# Patient Record
Sex: Male | Born: 1945
Health system: Southern US, Community
[De-identification: ages and names within clinical notes are randomized; demographics above are authoritative.]

## PROBLEM LIST (undated history)

## (undated) DIAGNOSIS — I2510 Atherosclerotic heart disease of native coronary artery without angina pectoris: Secondary | ICD-10-CM

## (undated) DIAGNOSIS — I480 Paroxysmal atrial fibrillation: Secondary | ICD-10-CM

## (undated) DIAGNOSIS — T8859XA Other complications of anesthesia, initial encounter: Secondary | ICD-10-CM

## (undated) DIAGNOSIS — E7800 Pure hypercholesterolemia, unspecified: Secondary | ICD-10-CM

## (undated) DIAGNOSIS — R079 Chest pain, unspecified: Secondary | ICD-10-CM

## (undated) DIAGNOSIS — R918 Other nonspecific abnormal finding of lung field: Secondary | ICD-10-CM

## (undated) DIAGNOSIS — G4733 Obstructive sleep apnea (adult) (pediatric): Secondary | ICD-10-CM

## (undated) DIAGNOSIS — I10 Essential (primary) hypertension: Secondary | ICD-10-CM

## (undated) DIAGNOSIS — T4145XA Adverse effect of unspecified anesthetic, initial encounter: Secondary | ICD-10-CM

## (undated) DIAGNOSIS — M1990 Unspecified osteoarthritis, unspecified site: Secondary | ICD-10-CM

## (undated) DIAGNOSIS — Z973 Presence of spectacles and contact lenses: Secondary | ICD-10-CM

## (undated) DIAGNOSIS — J40 Bronchitis, not specified as acute or chronic: Secondary | ICD-10-CM

## (undated) DIAGNOSIS — I4819 Other persistent atrial fibrillation: Secondary | ICD-10-CM

## (undated) DIAGNOSIS — G4730 Sleep apnea, unspecified: Secondary | ICD-10-CM

## (undated) HISTORY — DX: Other persistent atrial fibrillation: I48.19

## (undated) HISTORY — DX: Paroxysmal atrial fibrillation: I48.0

## (undated) HISTORY — DX: Obstructive sleep apnea (adult) (pediatric): G47.33

## (undated) HISTORY — DX: Atherosclerotic heart disease of native coronary artery without angina pectoris: I25.10

## (undated) HISTORY — PX: COLONOSCOPY: SHX174

## (undated) SURGERY — Surgical Case
Anesthesia: *Unknown

---
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
## 1998-04-28 HISTORY — PX: KNEE ARTHROSCOPY: SHX127

## 2000-11-30 ENCOUNTER — Ambulatory Visit (HOSPITAL_COMMUNITY): Admission: RE | Admit: 2000-11-30 | Discharge: 2000-11-30 | Payer: Self-pay | Admitting: Gastroenterology

## 2002-04-15 ENCOUNTER — Encounter: Admission: RE | Admit: 2002-04-15 | Discharge: 2002-04-15 | Payer: Self-pay | Admitting: Sports Medicine

## 2002-11-11 ENCOUNTER — Encounter: Admission: RE | Admit: 2002-11-11 | Discharge: 2002-11-11 | Payer: Self-pay | Admitting: Internal Medicine

## 2002-11-11 ENCOUNTER — Encounter: Payer: Self-pay | Admitting: Internal Medicine

## 2002-11-15 ENCOUNTER — Ambulatory Visit (HOSPITAL_BASED_OUTPATIENT_CLINIC_OR_DEPARTMENT_OTHER): Admission: RE | Admit: 2002-11-15 | Discharge: 2002-11-15 | Payer: Self-pay | Admitting: Internal Medicine

## 2002-12-26 ENCOUNTER — Ambulatory Visit (HOSPITAL_COMMUNITY): Admission: RE | Admit: 2002-12-26 | Discharge: 2002-12-26 | Payer: Self-pay | Admitting: Internal Medicine

## 2003-01-01 ENCOUNTER — Ambulatory Visit (HOSPITAL_BASED_OUTPATIENT_CLINIC_OR_DEPARTMENT_OTHER): Admission: RE | Admit: 2003-01-01 | Discharge: 2003-01-01 | Payer: Self-pay | Admitting: Internal Medicine

## 2011-04-10 ENCOUNTER — Ambulatory Visit (INDEPENDENT_AMBULATORY_CARE_PROVIDER_SITE_OTHER): Payer: MEDICARE

## 2011-04-10 DIAGNOSIS — R05 Cough: Secondary | ICD-10-CM

## 2011-04-10 DIAGNOSIS — R059 Cough, unspecified: Secondary | ICD-10-CM

## 2011-04-10 DIAGNOSIS — J209 Acute bronchitis, unspecified: Secondary | ICD-10-CM

## 2012-06-17 ENCOUNTER — Other Ambulatory Visit: Payer: Self-pay | Admitting: Orthopedic Surgery

## 2012-06-17 DIAGNOSIS — M25511 Pain in right shoulder: Secondary | ICD-10-CM

## 2012-06-22 ENCOUNTER — Ambulatory Visit
Admission: RE | Admit: 2012-06-22 | Discharge: 2012-06-22 | Disposition: A | Discharge status: Home / Self Care | Payer: Self-pay | Source: Ambulatory Visit | Attending: Orthopedic Surgery | Admitting: Orthopedic Surgery

## 2012-06-22 DIAGNOSIS — M25511 Pain in right shoulder: Secondary | ICD-10-CM

## 2012-07-09 ENCOUNTER — Encounter (HOSPITAL_BASED_OUTPATIENT_CLINIC_OR_DEPARTMENT_OTHER): Payer: Self-pay | Admitting: *Deleted

## 2012-07-13 ENCOUNTER — Encounter (HOSPITAL_BASED_OUTPATIENT_CLINIC_OR_DEPARTMENT_OTHER): Payer: Self-pay | Admitting: *Deleted

## 2012-07-13 NOTE — Progress Notes (Signed)
Was in [place]-just got back last pm-to see dr Earl Gala today-they will do EKG and Bmet and fax here Pt to bring all meds, cpap amd overnight bag just in case he has to stay

## 2012-07-14 NOTE — H&P (Signed)
[address]   [address] [place], [place] [address] [phone] A Division of [facility]  [name], M.D.   Ata A. Thurston Hole, M.D.   Burnell Blanks, M.D.   Eulas Post, M.D.   Lunette Stands, M.D Buford Dresser, M.D.  Charlsie Quest, M.D.   Estell Harpin, M.D.   Melina Fiddler, M.D. Genene Churn. Barry Dienes, PA-C            Kirstin A. Shepperson, PA-C Josh Merlin, PA-C [place], [place]   RE: Tyrel, Lex                                [id number]      DOB: [birth year] PROGRESS NOTE: 06-29-12 Ronald Hull is a 67 year-old male, new patient to the office.  Referred by Dr. Cindee Salt.  Regular physician Dr. Theressa Millard.  Traumatic injury falling on a sidewalk.  He has had off and on shoulder symptoms since.  Aggravated when he was trying to do some shoveling of snow more recently.  History of what sounds like a rupture of his long head biceps tendon retracted down his arm.  Got black and blue.  Unfortunately this did not resolve all of his subacromial symptoms.  Worked up with an MRI scan that has been completed.  I have reviewed the report and the scan itself.  Large full thickness retracted tear supraspinatus tendon with some tendinopathy of the subscapularis and infraspinatus.  Complete tearing long head biceps, but a retained stump of the tendon in the joint.  Despite the size of the tear and degree of retraction at 2.6 centimeters there is not a lot of demonstrable atrophy.  Degenerative changes AC greater than glenohumeral joint and they don't look too extreme in the glenohumeral joint.  Vast majority of his symptoms related to his cuff rather than arthritis.  He is 26 -years-old and very active.  He comes in to discuss definitive treatment. Entire history, workup and treatment to date reviewed.      EXAMINATION: General exam is outlined and included in the chart.  Specifically, I can still get his right shoulder through full motion.  There is  obvious cuff weakness.  Positive impingement.  Positive palms down abduction.  His biceps long head is out, retracted down, but this is not too extreme.  No apprehension or instability.  AC soreness is present.  Good function and strength opposite left rotator cuff.    X-RAYS: I did get some baseline three view x-rays showing a Type II-III acromion.  Degenerative changes AC joint.  Reasonable subacromial space.  Glenohumeral joint doesn't look bad.    DISPOSITION:  Getting to be longstanding retracted rotator cuff tear, as well as long head biceps tendon tear.  Getting worse rather than better.  We discussed definitive treatment.  One of my big worries is whether or not this is going to be able to be repaired, but I think he is still in that category.  Discussed exam under anesthesia, arthroscopy, decompression, debridement of biceps and rotator cuff repair.  Procedure, risks, benefits and complications reviewed in detail.  Paperwork complete.  All questions answered.  More than 45 minutes spent reviewing all of his previous notes, treatment and the vast majority of this in counseling where to go at this point in time with him.  He understands and agrees.    [name], M.D.  Electronically verified by [name], M.D. DFM:jjh Cc: Dr. Cindee Salt, fax: [phone] Cc: Dr. Theressa Millard, fax: [phone] D 06-30-12 T 07-01-12

## 2012-07-15 ENCOUNTER — Encounter (HOSPITAL_BASED_OUTPATIENT_CLINIC_OR_DEPARTMENT_OTHER)
Admission: RE | Disposition: A | Discharge status: Home / Self Care | Payer: Self-pay | Source: Ambulatory Visit | Attending: Orthopedic Surgery

## 2012-07-15 ENCOUNTER — Encounter (HOSPITAL_BASED_OUTPATIENT_CLINIC_OR_DEPARTMENT_OTHER): Payer: Self-pay | Admitting: Anesthesiology

## 2012-07-15 ENCOUNTER — Encounter (HOSPITAL_BASED_OUTPATIENT_CLINIC_OR_DEPARTMENT_OTHER): Payer: Self-pay | Admitting: *Deleted

## 2012-07-15 ENCOUNTER — Ambulatory Visit (HOSPITAL_BASED_OUTPATIENT_CLINIC_OR_DEPARTMENT_OTHER)
Admission: RE | Admit: 2012-07-15 | Discharge: 2012-07-15 | Disposition: A | Discharge status: Home / Self Care | Payer: Medicare Other | Source: Ambulatory Visit | Attending: Orthopedic Surgery | Admitting: Orthopedic Surgery

## 2012-07-15 ENCOUNTER — Ambulatory Visit (HOSPITAL_BASED_OUTPATIENT_CLINIC_OR_DEPARTMENT_OTHER): Payer: Medicare Other | Admitting: Anesthesiology

## 2012-07-15 DIAGNOSIS — M19019 Primary osteoarthritis, unspecified shoulder: Secondary | ICD-10-CM | POA: Insufficient documentation

## 2012-07-15 DIAGNOSIS — G4730 Sleep apnea, unspecified: Secondary | ICD-10-CM | POA: Insufficient documentation

## 2012-07-15 DIAGNOSIS — M719 Bursopathy, unspecified: Secondary | ICD-10-CM | POA: Insufficient documentation

## 2012-07-15 DIAGNOSIS — M75120 Complete rotator cuff tear or rupture of unspecified shoulder, not specified as traumatic: Secondary | ICD-10-CM | POA: Insufficient documentation

## 2012-07-15 DIAGNOSIS — M129 Arthropathy, unspecified: Secondary | ICD-10-CM | POA: Insufficient documentation

## 2012-07-15 DIAGNOSIS — M67919 Unspecified disorder of synovium and tendon, unspecified shoulder: Secondary | ICD-10-CM | POA: Insufficient documentation

## 2012-07-15 DIAGNOSIS — Z6841 Body Mass Index (BMI) 40.0 and over, adult: Secondary | ICD-10-CM | POA: Insufficient documentation

## 2012-07-15 DIAGNOSIS — M25819 Other specified joint disorders, unspecified shoulder: Secondary | ICD-10-CM | POA: Insufficient documentation

## 2012-07-15 DIAGNOSIS — Z9889 Other specified postprocedural states: Secondary | ICD-10-CM

## 2012-07-15 DIAGNOSIS — I10 Essential (primary) hypertension: Secondary | ICD-10-CM | POA: Insufficient documentation

## 2012-07-15 DIAGNOSIS — M249 Joint derangement, unspecified: Secondary | ICD-10-CM | POA: Insufficient documentation

## 2012-07-15 HISTORY — DX: Other complications of anesthesia, initial encounter: T88.59XA

## 2012-07-15 HISTORY — DX: Sleep apnea, unspecified: G47.30

## 2012-07-15 HISTORY — DX: Essential (primary) hypertension: I10

## 2012-07-15 HISTORY — DX: Presence of spectacles and contact lenses: Z97.3

## 2012-07-15 HISTORY — DX: Pure hypercholesterolemia, unspecified: E78.00

## 2012-07-15 HISTORY — DX: Adverse effect of unspecified anesthetic, initial encounter: T41.45XA

## 2012-07-15 HISTORY — DX: Unspecified osteoarthritis, unspecified site: M19.90

## 2012-07-15 HISTORY — PX: SHOULDER ARTHROSCOPY WITH ROTATOR CUFF REPAIR AND SUBACROMIAL DECOMPRESSION: SHX5686

## 2012-07-15 LAB — POCT HEMOGLOBIN-HEMACUE: Hemoglobin: 15.4 g/dL (ref 13.0–17.0)

## 2012-07-15 SURGERY — SHOULDER ARTHROSCOPY WITH ROTATOR CUFF REPAIR AND SUBACROMIAL DECOMPRESSION
Anesthesia: Regional | Site: Shoulder | Laterality: Right | Wound class: Clean

## 2012-07-15 MED ORDER — HYDROMORPHONE HCL PF 1 MG/ML IJ SOLN: 0.2500 mg | INTRAMUSCULAR | Status: DC | PRN

## 2012-07-15 MED ORDER — FENTANYL CITRATE 0.05 MG/ML IJ SOLN
INTRAMUSCULAR | Status: DC | PRN
  Administered 2012-07-15: 50 ug via INTRAVENOUS
  Administered 2012-07-15 (×2): 25 ug via INTRAVENOUS

## 2012-07-15 MED ORDER — ONDANSETRON HCL 4 MG/2ML IJ SOLN
INTRAMUSCULAR | Status: DC | PRN
  Administered 2012-07-15: 4 mg via INTRAVENOUS

## 2012-07-15 MED ORDER — OXYCODONE HCL 5 MG PO TABS: 5.0000 mg | ORAL_TABLET | Freq: Once | ORAL | Status: DC | PRN

## 2012-07-15 MED ORDER — OXYCODONE-ACETAMINOPHEN 5-325 MG PO TABS: 1.0000 | ORAL_TABLET | ORAL | Status: DC | PRN

## 2012-07-15 MED ORDER — SODIUM CHLORIDE 0.9 % IR SOLN
Status: DC | PRN
  Administered 2012-07-15: 9000 mL

## 2012-07-15 MED ORDER — CEFAZOLIN SODIUM-DEXTROSE 2-3 GM-% IV SOLR
2.0000 g | INTRAVENOUS | Status: AC
  Administered 2012-07-15: 3 g via INTRAVENOUS

## 2012-07-15 MED ORDER — LACTATED RINGERS IV SOLN
INTRAVENOUS | Status: DC
  Administered 2012-07-15: 07:00:00 via INTRAVENOUS

## 2012-07-15 MED ORDER — LIDOCAINE HCL (CARDIAC) 20 MG/ML IV SOLN
INTRAVENOUS | Status: DC | PRN
  Administered 2012-07-15: 100 mg via INTRAVENOUS

## 2012-07-15 MED ORDER — PROPOFOL 10 MG/ML IV BOLUS
INTRAVENOUS | Status: DC | PRN
  Administered 2012-07-15: 250 mg via INTRAVENOUS

## 2012-07-15 MED ORDER — SUCCINYLCHOLINE CHLORIDE 20 MG/ML IJ SOLN
INTRAMUSCULAR | Status: DC | PRN
  Administered 2012-07-15: 100 mg via INTRAVENOUS

## 2012-07-15 MED ORDER — FENTANYL CITRATE 0.05 MG/ML IJ SOLN
50.0000 ug | INTRAMUSCULAR | Status: DC | PRN
  Administered 2012-07-15: 100 ug via INTRAVENOUS

## 2012-07-15 MED ORDER — DEXAMETHASONE SODIUM PHOSPHATE 4 MG/ML IJ SOLN
INTRAMUSCULAR | Status: DC | PRN
  Administered 2012-07-15: 10 mg via INTRAVENOUS

## 2012-07-15 MED ORDER — MIDAZOLAM HCL 2 MG/2ML IJ SOLN
1.0000 mg | INTRAMUSCULAR | Status: DC | PRN
  Administered 2012-07-15: 2 mg via INTRAVENOUS

## 2012-07-15 MED ORDER — BUPIVACAINE-EPINEPHRINE PF 0.5-1:200000 % IJ SOLN
INTRAMUSCULAR | Status: DC | PRN
  Administered 2012-07-15: 30 mL

## 2012-07-15 MED ORDER — OXYCODONE HCL 5 MG/5ML PO SOLN: 5.0000 mg | Freq: Once | ORAL | Status: DC | PRN

## 2012-07-15 MED ORDER — ONDANSETRON HCL 4 MG/2ML IJ SOLN: 4.0000 mg | Freq: Four times a day (QID) | INTRAMUSCULAR | Status: DC | PRN

## 2012-07-15 SURGICAL SUPPLY — 67 items
ANCHOR PEEK SWIVEL LOCK 5.5 (Anchor) ×4 IMPLANT
BENZOIN TINCTURE PRP APPL 2/3 (GAUZE/BANDAGES/DRESSINGS) IMPLANT
BLADE CUTTER GATOR 3.5 (BLADE) ×2 IMPLANT
BLADE CUTTER MENIS 5.5 (BLADE) IMPLANT
BLADE GREAT WHITE 4.2 (BLADE) ×2 IMPLANT
BLADE SURG 15 STRL LF DISP TIS (BLADE) IMPLANT
BLADE SURG 15 STRL SS (BLADE)
BUR OVAL 6.0 (BURR) ×2 IMPLANT
CANISTER OMNI JUG 16 LITER (MISCELLANEOUS) ×2 IMPLANT
CANISTER SUCTION 2500CC (MISCELLANEOUS) IMPLANT
CANNULA DRY DOC 8X75 (CANNULA) ×2 IMPLANT
CANNULA TWIST IN 8.25X7CM (CANNULA) IMPLANT
CLOTH BEACON ORANGE TIMEOUT ST (SAFETY) ×2 IMPLANT
DECANTER SPIKE VIAL GLASS SM (MISCELLANEOUS) IMPLANT
DRAPE OEC MINIVIEW 54X84 (DRAPES) IMPLANT
DRAPE STERI 35X30 U-POUCH (DRAPES) ×2 IMPLANT
DRAPE U-SHAPE 47X51 STRL (DRAPES) ×2 IMPLANT
DRAPE U-SHAPE 76X120 STRL (DRAPES) ×4 IMPLANT
DRSG PAD ABDOMINAL 8X10 ST (GAUZE/BANDAGES/DRESSINGS) ×2 IMPLANT
DURAPREP 26ML APPLICATOR (WOUND CARE) ×2 IMPLANT
ELECT MENISCUS 165MM 90D (ELECTRODE) ×2 IMPLANT
ELECT NEEDLE TIP 2.8 STRL (NEEDLE) IMPLANT
ELECT REM PT RETURN 9FT ADLT (ELECTROSURGICAL) ×2
ELECTRODE REM PT RTRN 9FT ADLT (ELECTROSURGICAL) ×1 IMPLANT
GAUZE XEROFORM 1X8 LF (GAUZE/BANDAGES/DRESSINGS) ×2 IMPLANT
GLOVE BIOGEL PI IND STRL 8 (GLOVE) ×1 IMPLANT
GLOVE BIOGEL PI INDICATOR 8 (GLOVE) ×1
GLOVE ORTHO TXT STRL SZ7.5 (GLOVE) ×4 IMPLANT
GOWN PREVENTION PLUS XLARGE (GOWN DISPOSABLE) ×4 IMPLANT
GOWN STRL REIN 2XL XLG LVL4 (GOWN DISPOSABLE) ×2 IMPLANT
IV NS IRRIG 3000ML ARTHROMATIC (IV SOLUTION) ×8 IMPLANT
NDL SUT 6 .5 CRC .975X.05 MAYO (NEEDLE) IMPLANT
NEEDLE MAYO TAPER (NEEDLE)
NEEDLE SCORPION MULTI FIRE (NEEDLE) ×2 IMPLANT
NS IRRIG 1000ML POUR BTL (IV SOLUTION) IMPLANT
PACK ARTHROSCOPY DSU (CUSTOM PROCEDURE TRAY) ×2 IMPLANT
PACK BASIN DAY SURGERY FS (CUSTOM PROCEDURE TRAY) ×2 IMPLANT
PASSER SUT SWANSON 36MM LOOP (INSTRUMENTS) IMPLANT
PENCIL BUTTON HOLSTER BLD 10FT (ELECTRODE) ×2 IMPLANT
SET ARTHROSCOPY TUBING (MISCELLANEOUS) ×1
SET ARTHROSCOPY TUBING LN (MISCELLANEOUS) ×1 IMPLANT
SLEEVE SCD COMPRESS KNEE MED (MISCELLANEOUS) ×2 IMPLANT
SLING ARM FOAM STRAP LRG (SOFTGOODS) IMPLANT
SLING ARM FOAM STRAP MED (SOFTGOODS) IMPLANT
SLING ARM FOAM STRAP XLG (SOFTGOODS) IMPLANT
SLING ARM IMMOBILIZER LRG (SOFTGOODS) IMPLANT
SLING ARM IMMOBILIZER MED (SOFTGOODS) IMPLANT
SPONGE GAUZE 4X4 12PLY (GAUZE/BANDAGES/DRESSINGS) ×4 IMPLANT
SPONGE LAP 4X18 X RAY DECT (DISPOSABLE) IMPLANT
STRIP CLOSURE SKIN 1/2X4 (GAUZE/BANDAGES/DRESSINGS) IMPLANT
SUCTION FRAZIER TIP 10 FR DISP (SUCTIONS) IMPLANT
SUT ETHIBOND 2 OS 4 DA (SUTURE) IMPLANT
SUT ETHILON 2 0 FS 18 (SUTURE) IMPLANT
SUT ETHILON 3 0 PS 1 (SUTURE) ×2 IMPLANT
SUT FIBERWIRE #2 38 T-5 BLUE (SUTURE)
SUT RETRIEVER MED (INSTRUMENTS) IMPLANT
SUT TIGER TAPE 7 IN WHITE (SUTURE) ×2 IMPLANT
SUT VIC AB 0 CT1 27 (SUTURE)
SUT VIC AB 0 CT1 27XBRD ANBCTR (SUTURE) IMPLANT
SUT VIC AB 2-0 SH 27 (SUTURE)
SUT VIC AB 2-0 SH 27XBRD (SUTURE) IMPLANT
SUT VIC AB 3-0 FS2 27 (SUTURE) IMPLANT
SUTURE FIBERWR #2 38 T-5 BLUE (SUTURE) IMPLANT
TAPE FIBER 2MM 7IN #2 BLUE (SUTURE) ×2 IMPLANT
TOWEL OR 17X24 6PK STRL BLUE (TOWEL DISPOSABLE) ×2 IMPLANT
WATER STERILE IRR 1000ML POUR (IV SOLUTION) ×2 IMPLANT
YANKAUER SUCT BULB TIP NO VENT (SUCTIONS) IMPLANT

## 2012-07-15 NOTE — Interval H&P Note (Signed)
History and Physical Interval Note:  07/15/2012 7:28 AM  Ronald Hull  has presented today for surgery, with the diagnosis of RIGHT SHOULDER IMPINGEMENT SYNDROME, DEGENERATIVE ARTHRITIS-SHOULDER, A/C JOINT, COMPLETE RUPTURE OF ROTATOR CUFF, 727.61  The various methods of treatment have been discussed with the patient and family. After consideration of risks, benefits and other options for treatment, the patient has consented to  Procedure(s) with comments: RIGHT ARTHROSCOPY SHOULDER DECOMPRESSION  SUBACROMIAL PARTIAL ACROMIOPLASTY WITH CORACOACROMIAL RELEASE, ARTHROSCOPY SHOULDER DISTAL CLAVICULECTOMY, ARTHROSCOPY SHOULDER WITH ROTATOR CUFF REPAIR (Right) - RIGHT SHOULDER SCOPE, SAD, DCE, RTC REPAIR ANESTHESIA: GENERAL, PRE/POST OP SCALENE as a surgical intervention .  The patient's history has been reviewed, patient examined, no change in status, stable for surgery.  I have reviewed the patient's chart and labs.  Questions were answered to the patient's satisfaction.     Glynnis Gavel F

## 2012-07-15 NOTE — Transfer of Care (Signed)
Immediate Anesthesia Transfer of Care Note  Patient: Ronald Hull  Procedure(s) Performed: Procedure(s): RIGHT ARTHROSCOPY SHOULDER DECOMPRESSION  SUBACROMIAL PARTIAL ACROMIOPLASTY WITH CORACOACROMIAL RELEASE,  DISTAL CLAVICULECTOMY, resect biceps debride labrium WITH ROTATOR CUFF REPAIR (Right)  Patient Location: PACU  Anesthesia Type:General  Level of Consciousness: awake and alert   Airway & Oxygen Therapy: Patient Spontanous Breathing and Patient connected to face mask oxygen  Post-op Assessment: Report given to PACU RN and Post -op Vital signs reviewed and stable  Post vital signs: Reviewed and stable  Complications: No apparent anesthesia complications

## 2012-07-15 NOTE — Anesthesia Preprocedure Evaluation (Signed)
Anesthesia Evaluation  Patient identified by MRN, date of birth, ID band Patient awake    Reviewed: Allergy & Precautions, H&P , NPO status , Patient's Chart, lab work & pertinent test results  Airway Mallampati: II  Neck ROM: full    Dental   Pulmonary sleep apnea ,          Cardiovascular hypertension,     Neuro/Psych    GI/Hepatic   Endo/Other  Morbid obesity  Renal/GU      Musculoskeletal  (+) Arthritis -,   Abdominal   Peds  Hematology   Anesthesia Other Findings   Reproductive/Obstetrics                           Anesthesia Physical Anesthesia Plan  ASA: II  Anesthesia Plan: General and Regional   Post-op Pain Management: MAC Combined w/ Regional for Post-op pain   Induction: Intravenous  Airway Management Planned: Oral ETT  Additional Equipment:   Intra-op Plan:   Post-operative Plan: Extubation in OR  Informed Consent: I have reviewed the patients History and Physical, chart, labs and discussed the procedure including the risks, benefits and alternatives for the proposed anesthesia with the patient or authorized representative who has indicated his/her understanding and acceptance.     Plan Discussed with: CRNA and Surgeon  Anesthesia Plan Comments:         Anesthesia Quick Evaluation

## 2012-07-15 NOTE — Anesthesia Postprocedure Evaluation (Signed)
Anesthesia Post Note  Patient: Ronald Hull  Procedure(s) Performed: Procedure(s) (LRB): RIGHT ARTHROSCOPY SHOULDER DECOMPRESSION  SUBACROMIAL PARTIAL ACROMIOPLASTY WITH CORACOACROMIAL RELEASE,  DISTAL CLAVICULECTOMY, resect biceps debride labrium WITH ROTATOR CUFF REPAIR (Right)  Anesthesia type: General  Patient location: PACU  Post pain: Pain level controlled and Adequate analgesia  Post assessment: Post-op Vital signs reviewed, Patient's Cardiovascular Status Stable, Respiratory Function Stable, Patent Airway and Pain level controlled  Last Vitals:  Filed Vitals:   07/15/12 0945  BP:   Pulse: 62  Temp:   Resp: 15    Post vital signs: Reviewed and stable  Level of consciousness: awake, alert  and oriented  Complications: No apparent anesthesia complications

## 2012-07-15 NOTE — Brief Op Note (Signed)
07/15/2012  10:21 AM  PATIENT:  Ronald Hull  67 y.o. male  PRE-OPERATIVE DIAGNOSIS:  RIGHT SHOULDER IMPINGEMENT SYNDROME, DEGENERATIVE ARTHRITIS-SHOULDER, A/C JOINT, COMPLETE RUPTURE OF ROTATOR CUFF, 727.61  POST-OPERATIVE DIAGNOSIS:  right shoulder impingement syndrome degenerative arthritis shoulder acromioclavicular joint complete rupture of rotator cuff tear labrum  PROCEDURE:  Procedure(s): RIGHT ARTHROSCOPY SHOULDER DECOMPRESSION  SUBACROMIAL PARTIAL ACROMIOPLASTY WITH CORACOACROMIAL RELEASE,  DISTAL CLAVICULECTOMY, resect biceps debride labrium WITH ROTATOR CUFF REPAIR (Right)  SURGEON:  Surgeon(s) and Role:    * [name], MD - Primary  PHYSICIAN ASSISTANT: Zonia Kief M    ANESTHESIA:   general  EBL:  Total I/O In: 1230 [P.O.:30; I.V.:1200] Out: -    SPECIMEN:  No Specimen  DISPOSITION OF SPECIMEN:  N/A  COUNTS:  YES  TOURNIQUET:  * No tourniquets in log * PATIENT DISPOSITION:  PACU - hemodynamically stable.

## 2012-07-15 NOTE — Anesthesia Procedure Notes (Addendum)
Procedure Name: Intubation Performed by: York Grice Pre-anesthesia Checklist: Patient identified, Timeout performed, Emergency Drugs available, Suction available and Patient being monitored Patient Re-evaluated:Patient Re-evaluated prior to inductionOxygen Delivery Method: Circle system utilized Preoxygenation: Pre-oxygenation with 100% oxygen Intubation Type: IV induction Ventilation: Mask ventilation without difficulty Laryngoscope Size: Miller and 2 Grade View: Grade I Tube size: 8.0 mm Number of attempts: 1 Airway Equipment and Method: Stylet and Video-laryngoscopy Placement Confirmation: breath sounds checked- equal and bilateral and positive ETCO2 Secured at: 23 cm Tube secured with: Tape Dental Injury: Teeth and Oropharynx as per pre-operative assessment    Anesthesia Regional Block:  Interscalene brachial plexus block  Pre-Anesthetic Checklist: ,, timeout performed, Correct Patient, Correct Site, Correct Laterality, Correct Procedure, Correct Position, site marked, Risks and benefits discussed,  Surgical consent,  Pre-op evaluation,  At surgeon's request and post-op pain management  Laterality: Right  Prep: chloraprep       Needles:  Injection technique: Single-shot  Needle Type: Echogenic Stimulator Needle     Needle Length: 5cm 5 cm Needle Gauge: 22 and 22 G    Additional Needles:  Procedures: ultrasound guided (picture in chart) and nerve stimulator Interscalene brachial plexus block  Nerve Stimulator or Paresthesia:  Response: biceps flexion, 0.45 mA,   Additional Responses:   Narrative:  Start time: 07/15/2012 7:04 AM End time: 07/15/2012 7:12 AM Injection made incrementally with aspirations every 5 mL.  Performed by: Personally  Anesthesiologist: Dr Chaney Malling  Additional Notes: Functioning IV was confirmed and monitors were applied.  A 50mm 22ga Arrow echogenic stimulator needle was used. Sterile prep and drape,hand hygiene and sterile gloves  were used.  Negative aspiration and negative test dose prior to incremental administration of local anesthetic. The patient tolerated the procedure well.  Ultrasound guidance: relevent anatomy identified, needle position confirmed, local anesthetic spread visualized around nerve(s), vascular puncture avoided.  Image printed for medical record.   Interscalene brachial plexus block

## 2012-07-15 NOTE — Progress Notes (Signed)
Assisted Dr. Hodierne with right, ultrasound guided, interscalene  block. Side rails up, monitors on throughout procedure. See vital signs in flow sheet. Tolerated Procedure well. 

## 2012-07-16 ENCOUNTER — Encounter (HOSPITAL_BASED_OUTPATIENT_CLINIC_OR_DEPARTMENT_OTHER): Payer: Self-pay | Admitting: Orthopedic Surgery

## 2012-07-19 NOTE — Op Note (Signed)
NAMESHOWN, DISSINGER NO.:  [id number]  MEDICAL RECORD NO.:  [id number]  LOCATION:                                 FACILITY:  PHYSICIAN:  [name], M.D. DATE OF BIRTH:  08-14-1945  DATE OF PROCEDURE:  07/15/2012 DATE OF DISCHARGE:                              OPERATIVE REPORT   PREOPERATIVE DIAGNOSES: 1. Right shoulder acute/subacute rotator cuff tear, entire     supraspinatus as well as portions of infraspinatus and     subscapularis, retracted. 2. Tearing long head biceps tendon, impingement. 3. Distal clavicle degenerative joint disease.  POSTOPERATIVE DIAGNOSES: 1. Right shoulder acute/subacute rotator cuff tear, entire     supraspinatus as well as portions of infraspinatus and     subscapularis, retracted. 2. Tearing long head biceps tendon, impingement. 3. Distal clavicle degenerative joint disease. 4. Large retained stump of biceps tendon. 5. Large retracted cuff tear, but this was reasonable tissue quality,     able to be mobilized and repaired.  Extensive circumferential     tearing of labrum.  No other significant degenerative changes.  PROCEDURES: 1. Right shoulder exam under anesthesia. 2. Arthroscopy. 3. Debridement of retained biceps. 4. Debridement of circumferential labral tears. 5. Debridement of mobilization rotator cuff. 6. Bursectomy. 7. Acromioplasty. 8. Coracoacromial ligament release. 9. Excision of distal clavicle. 10.Arthroscopic-assisted rotator cuff repair, fiber weave suture x2,     swivel lock anchors x2.  SURGEON:  [name], MD  ASSISTANT:  Genene Churn. Barry Dienes, [place], present throughout the entire case and necessary for timely completion of procedure.  ANESTHESIA:  General.  ESTIMATED BLOOD LOSS:  Minimal.  SPECIMENS:  None.  CULTURES:  None.  COMPLICATIONS:  None.  DRESSINGS:  Soft compressive shoulder immobilizer.  PROCEDURE:  The patient was brought to the operating room, placed on  the operating table in supine position.  After adequate anesthesia had been obtained, shoulder examined.  Full motion, stable shoulder.  Placed in a beach-chair position on the shoulder positioner, prepped and draped in usual sterile fashion.  Three portals anterior, posterior, and lateral. Arthroscope introduced.  Shoulder distended and inspected. Circumferential degenerative labral tears debrided.  Minimal degenerative changes in the glenohumeral joint.  Stump of biceps debrided out.  All the radial artery were retracted out of the shoulder. The entire supraspinatus top of the infraspinatus is subscapularis torn. These were retracted good 2-3 cm, but still tissue quality was relatively good.  Interval tearing between the infra and supraspinatus. I was able to mobilize this, assess it, and I felt it could be repaired. Type 3 acromion.  Acromioplasty to a type 1 with shaver and high-speed burr, releasing CA ligament facilitated.  Grade 4 changes distal clavicle.  Periarticular spurs lateral cm of clavicle resected.  With a cannula laterally, the cuff was mobilized, tuberosity roughened.  I placed a horizontal mattress suture in the infraspinatus, brought it out laterally, anchored it down the tuberosity with a swivel lock anchor.  I then used a second fiber weave suture to the baseball stitch between the infraspinatus and supraspinatus, bringing both of these out laterally and closing the interval tear.  This was then anchored down into the second  swivel lock anchor more anteriorly in the tuberosity.  Nice firm watertight closure achieved.  Assessed from above and below. Instruments were fully removed.  Portals were closed with nylon. Sterile compressive dressing applied.  Sling applied.  Anesthesia reversed.  Brought to the recovery room.  Tolerated the surgery well with no complications.     [name], M.D.     DFM/MEDQ  D:  07/15/2012  T:  07/16/2012  Job:  [id number]

## 2012-07-27 ENCOUNTER — Ambulatory Visit (INDEPENDENT_AMBULATORY_CARE_PROVIDER_SITE_OTHER): Payer: MEDICARE | Admitting: Family Medicine

## 2012-07-27 VITALS — BP 128/82 | HR 79 | Temp 98.7°F | Resp 17 | Ht 70.5 in | Wt 286.0 lb

## 2012-07-27 DIAGNOSIS — L03012 Cellulitis of left finger: Secondary | ICD-10-CM

## 2012-07-27 DIAGNOSIS — M79645 Pain in left finger(s): Secondary | ICD-10-CM

## 2012-07-27 DIAGNOSIS — L02519 Cutaneous abscess of unspecified hand: Secondary | ICD-10-CM

## 2012-07-27 DIAGNOSIS — L03019 Cellulitis of unspecified finger: Secondary | ICD-10-CM

## 2012-07-27 DIAGNOSIS — M79609 Pain in unspecified limb: Secondary | ICD-10-CM

## 2012-07-27 MED ORDER — DOXYCYCLINE HYCLATE 100 MG PO TABS: 100.0000 mg | ORAL_TABLET | Freq: Two times a day (BID) | ORAL | Status: DC

## 2012-07-27 NOTE — Progress Notes (Signed)
Subjective: 67 year old man who has a painful left thumb. He is in a sling in his right arm from recent rotator cuff surgery 2 weeks ago. He's been using his left hand more because of that. He is started hurting about Saturday in the left thumb radial side of the thumb. It has gotten more swollen and painful last night and today.  Objective: Erythema on the radial aspect of the left thumb is surrounding the half of the nail. There is whitish just discoloration close to the nail, consistent with having a pocket of pus deep to this. It is very tender.  Assessment: Acute paronychia  Plan: This will need to be drained. Will place on doxycycline twice a day. Cautioned him about risk of sunburn.

## 2012-07-27 NOTE — Progress Notes (Signed)
   Patient ID: [name] MRN: [id number], DOB: 03/01/1946, 67 y.o. Date of Encounter: 07/27/2012, 6:29 PM   PROCEDURE NOTE: Verbal consent obtained. Sterile technique employed. Numbing: Anesthesia obtained 2% plain lidocaine 4 cc for digital block.  Betadine prep per usual protocol.  Hockey stick incision made along the medial aspect of the affected nail. Purulence expressed.   Culture taken and sent for C&S. Dressed. Wound care instructions including precautions covered with patient. Patient tolerated procedure well.  SignedEula Listen, PA-C 07/27/2012 6:29 PM

## 2012-07-27 NOTE — Patient Instructions (Signed)
Doxycycline one twice daily

## 2012-07-31 LAB — WOUND CULTURE: Gram Stain: NONE SEEN

## 2012-08-03 ENCOUNTER — Telehealth: Payer: Self-pay

## 2012-08-03 NOTE — Telephone Encounter (Signed)
Patient states he had a missed call regarding his bw results. Message stated that he call back tried connecting to lab no answer. Patient would like a call back at [phone]. Thanks

## 2012-08-03 NOTE — Telephone Encounter (Signed)
See labs 

## 2012-11-16 ENCOUNTER — Ambulatory Visit: Payer: Medicare Other

## 2012-11-16 ENCOUNTER — Ambulatory Visit (INDEPENDENT_AMBULATORY_CARE_PROVIDER_SITE_OTHER): Payer: Medicare Other | Admitting: Internal Medicine

## 2012-11-16 VITALS — BP 130/86 | HR 70 | Temp 98.1°F | Resp 18 | Ht 70.5 in | Wt 285.0 lb

## 2012-11-16 DIAGNOSIS — R05 Cough: Secondary | ICD-10-CM

## 2012-11-16 DIAGNOSIS — J189 Pneumonia, unspecified organism: Secondary | ICD-10-CM

## 2012-11-16 DIAGNOSIS — R059 Cough, unspecified: Secondary | ICD-10-CM

## 2012-11-16 DIAGNOSIS — R079 Chest pain, unspecified: Secondary | ICD-10-CM

## 2012-11-16 DIAGNOSIS — R61 Generalized hyperhidrosis: Secondary | ICD-10-CM

## 2012-11-16 DIAGNOSIS — B370 Candidal stomatitis: Secondary | ICD-10-CM

## 2012-11-16 DIAGNOSIS — R5381 Other malaise: Secondary | ICD-10-CM

## 2012-11-16 DIAGNOSIS — R5383 Other fatigue: Secondary | ICD-10-CM

## 2012-11-16 LAB — POCT UA - MICROSCOPIC ONLY
Bacteria, U Microscopic: NEGATIVE
Casts, Ur, LPF, POC: NEGATIVE
Crystals, Ur, HPF, POC: NEGATIVE
Mucus, UA: NEGATIVE
RBC, urine, microscopic: NEGATIVE
WBC, Ur, HPF, POC: NEGATIVE
Yeast, UA: NEGATIVE

## 2012-11-16 LAB — POCT CBC
Granulocyte percent: 74.4 %G (ref 37–80)
HCT, POC: 42.9 % — AB (ref 43.5–53.7)
Hemoglobin: 13.3 g/dL — AB (ref 14.1–18.1)
Lymph, poc: 1.4 (ref 0.6–3.4)
MCH, POC: 30.8 pg (ref 27–31.2)
MCHC: 31 g/dL — AB (ref 31.8–35.4)
MCV: 99.2 fL — AB (ref 80–97)
MID (cbc): 0.7 (ref 0–0.9)
MPV: 8.5 fL (ref 0–99.8)
POC Granulocyte: 6.2 (ref 2–6.9)
POC LYMPH PERCENT: 17.3 %L (ref 10–50)
POC MID %: 8.3 %M (ref 0–12)
Platelet Count, POC: 230 10*3/uL (ref 142–424)
RBC: 4.32 M/uL — AB (ref 4.69–6.13)
RDW, POC: 14.5 %
WBC: 8.3 10*3/uL (ref 4.6–10.2)

## 2012-11-16 LAB — POCT URINALYSIS DIPSTICK
Bilirubin, UA: NEGATIVE
Blood, UA: NEGATIVE
Glucose, UA: NEGATIVE
Ketones, UA: NEGATIVE
Leukocytes, UA: NEGATIVE
Nitrite, UA: NEGATIVE
Protein, UA: NEGATIVE
Spec Grav, UA: 1.015
Urobilinogen, UA: 0.2
pH, UA: 5.5

## 2012-11-16 LAB — POCT SKIN KOH: Skin KOH, POC: NEGATIVE

## 2012-11-16 LAB — GLUCOSE, POCT (MANUAL RESULT ENTRY): POC Glucose: 82 mg/dl (ref 70–99)

## 2012-11-16 MED ORDER — ALBUTEROL SULFATE HFA 108 (90 BASE) MCG/ACT IN AERS: 2.0000 | INHALATION_SPRAY | Freq: Four times a day (QID) | RESPIRATORY_TRACT | Status: DC | PRN

## 2012-11-16 MED ORDER — HYDROCODONE-ACETAMINOPHEN 7.5-325 MG/15ML PO SOLN: 5.0000 mL | Freq: Four times a day (QID) | ORAL | Status: DC | PRN

## 2012-11-16 MED ORDER — AZITHROMYCIN 500 MG PO TABS: 500.0000 mg | ORAL_TABLET | Freq: Every day | ORAL | Status: DC

## 2012-11-16 MED ORDER — ALBUTEROL SULFATE (2.5 MG/3ML) 0.083% IN NEBU
2.5000 mg | INHALATION_SOLUTION | Freq: Once | RESPIRATORY_TRACT | Status: AC
  Administered 2012-11-16: 2.5 mg via RESPIRATORY_TRACT

## 2012-11-16 MED ORDER — IPRATROPIUM BROMIDE 0.02 % IN SOLN
0.5000 mg | Freq: Once | RESPIRATORY_TRACT | Status: AC
  Administered 2012-11-16: 0.5 mg via RESPIRATORY_TRACT

## 2012-11-16 MED ORDER — CEFTRIAXONE SODIUM 1 G IJ SOLR
1.0000 g | Freq: Once | INTRAMUSCULAR | Status: AC
  Administered 2012-11-16: 1 g via INTRAMUSCULAR

## 2012-11-16 NOTE — Progress Notes (Signed)
Subjective:    Patient ID: Ronald Hull, male    DOB: 1945/08/02, 67 y.o.   MRN: [id number]  HPI 67 y.o. Male presents to clinic with none productive cough, fatigue, and chest pain . Patient states that he has sweats at night . Cough worse in the past 2 days .Extreme fatigue and constant cough, no hemoptysis. Denies sob, states I have walking pneumonia. Chest pain is vague and assoc with cough, no past hx of heart disease. Recent f/up with Dr. Earl Gala stable and htn controlled. Traveled to [place] and got sick on the trip.      Review of Systems htn/obesity    Objective:   Physical Exam  Vitals reviewed. Constitutional: He is oriented to person, place, and time. He appears well-nourished. No distress.  HENT:  Right Ear: External ear normal.  Left Ear: External ear normal.  Nose: Nose normal.  Mouth/Throat: Oropharyngeal exudate present.  Tongue coated  , KOH done  Eyes: EOM are normal.  Neck: Normal range of motion. Neck supple.  Cardiovascular: Normal rate, regular rhythm and normal heart sounds.   Pulmonary/Chest: Effort normal. No respiratory distress. He has wheezes. He has rales. He exhibits tenderness.  Lymphadenopathy:    He has no cervical adenopathy.  Neurological: He is alert and oriented to person, place, and time. He exhibits normal muscle tone. Coordination normal.  Skin: No rash noted.  Psychiatric: He has a normal mood and affect.   [facility] reading (PRIMARY) by  Dr Perrin Maltese. Julious Oka Kristen Cardinal right lower lobe Results for orders placed in visit on 11/16/12  GLUCOSE, POCT (MANUAL RESULT ENTRY)      Result Value Range   POC Glucose 82  70 - 99 mg/dl  POCT CBC      Result Value Range   WBC 8.3  4.6 - 10.2 K/uL   Lymph, poc 1.4  0.6 - 3.4   POC LYMPH PERCENT 17.3  10 - 50 %L   MID (cbc) 0.7  0 - 0.9   POC MID % 8.3  0 - 12 %M   POC Granulocyte 6.2  2 - 6.9   Granulocyte percent 74.4  37 - 80 %G   RBC 4.32 (*) 4.69 - 6.13 M/uL   Hemoglobin 13.3 (*) 14.1 -  18.1 g/dL   HCT, POC 40.9 (*) 81.1 - 53.7 %   MCV 99.2 (*) 80 - 97 fL   MCH, POC 30.8  27 - 31.2 pg   MCHC 31.0 (*) 31.8 - 35.4 g/dL   RDW, POC 91.4     Platelet Count, POC 230  142 - 424 K/uL   MPV 8.5  0 - 99.8 fL  POCT UA - MICROSCOPIC ONLY      Result Value Range   WBC, Ur, HPF, POC neg     RBC, urine, microscopic neg     Bacteria, U Microscopic neg     Mucus, UA neg     Epithelial cells, urine per micros 1-3     Crystals, Ur, HPF, POC neg     Casts, Ur, LPF, POC neg     Yeast, UA neg    POCT URINALYSIS DIPSTICK      Result Value Range   Color, UA amber     Clarity, UA clear     Glucose, UA neg     Bilirubin, UA neg     Ketones, UA neg     Spec Grav, UA 1.015     Blood, UA neg  pH, UA 5.5     Protein, UA neg     Urobilinogen, UA 0.2     Nitrite, UA neg     Leukocytes, UA Negative    POCT SKIN KOH      Result Value Range   Skin KOH, POC Negative      Improved a lot after Neb       Assessment & Plan:  Cough/Diaphoresis/Pneumonia RLL Rocephin 1g/Nebulizer tx Zithromax 500mg /Lortab elixir

## 2012-11-16 NOTE — Patient Instructions (Addendum)

## 2012-11-18 ENCOUNTER — Ambulatory Visit (INDEPENDENT_AMBULATORY_CARE_PROVIDER_SITE_OTHER): Payer: Medicare Other | Admitting: Internal Medicine

## 2012-11-18 VITALS — BP 140/76 | HR 65 | Temp 98.1°F | Resp 16 | Ht 70.75 in | Wt 282.8 lb

## 2012-11-18 DIAGNOSIS — J9801 Acute bronchospasm: Secondary | ICD-10-CM

## 2012-11-18 DIAGNOSIS — J159 Unspecified bacterial pneumonia: Secondary | ICD-10-CM

## 2012-11-18 MED ORDER — CEFTRIAXONE SODIUM 1 G IJ SOLR
1.0000 g | INTRAMUSCULAR | Status: DC
  Administered 2012-11-18: 1 g via INTRAMUSCULAR

## 2012-11-18 MED ORDER — ALBUTEROL SULFATE (2.5 MG/3ML) 0.083% IN NEBU
2.5000 mg | INHALATION_SOLUTION | Freq: Once | RESPIRATORY_TRACT | Status: AC
  Administered 2012-11-18: 2.5 mg via RESPIRATORY_TRACT

## 2012-11-18 MED ORDER — IPRATROPIUM BROMIDE 0.02 % IN SOLN
0.5000 mg | Freq: Once | RESPIRATORY_TRACT | Status: AC
  Administered 2012-11-18: 0.5 mg via RESPIRATORY_TRACT

## 2012-11-18 NOTE — Progress Notes (Signed)
  Subjective:    Patient ID: Ronald Hull, male    DOB: 1945/08/17, 67 y.o.   MRN: [id number]  HPI Improved 30%, requests another neb. Has not stopped sweating. Used inhaler 2-3 times. Never had pneumovax.   Review of Systems     Objective:   Physical Exam  Constitutional: He is oriented to person, place, and time. He appears well-developed and well-nourished.  HENT:  Right Ear: External ear normal.  Left Ear: External ear normal.  Nose: Nose normal.  Mouth/Throat: Oropharynx is clear and moist.  Pulmonary/Chest: Effort normal. Not tachypneic. No respiratory distress. He has wheezes. He has no rales. He exhibits no tenderness.  Neurological: He is alert and oriented to person, place, and time. He exhibits normal muscle tone. Coordination normal.  Skin: No rash noted. He is diaphoretic.     Neb Rocephin 1g     Assessment & Plan:  Pneumonia slowly improving Recheck 1 week/sooner prn

## 2012-11-18 NOTE — Patient Instructions (Addendum)

## 2012-11-25 ENCOUNTER — Ambulatory Visit: Payer: Medicare Other

## 2012-11-25 ENCOUNTER — Ambulatory Visit (INDEPENDENT_AMBULATORY_CARE_PROVIDER_SITE_OTHER): Payer: Medicare Other | Admitting: Internal Medicine

## 2012-11-25 VITALS — BP 140/72 | HR 61 | Temp 97.7°F | Resp 18 | Ht 70.5 in | Wt 281.0 lb

## 2012-11-25 DIAGNOSIS — J040 Acute laryngitis: Secondary | ICD-10-CM

## 2012-11-25 DIAGNOSIS — J159 Unspecified bacterial pneumonia: Secondary | ICD-10-CM

## 2012-11-25 DIAGNOSIS — J9801 Acute bronchospasm: Secondary | ICD-10-CM

## 2012-11-25 MED ORDER — IPRATROPIUM BROMIDE 0.02 % IN SOLN
0.5000 mg | Freq: Once | RESPIRATORY_TRACT | Status: AC
  Administered 2012-11-25: 0.5 mg via RESPIRATORY_TRACT

## 2012-11-25 MED ORDER — PREDNISONE 10 MG PO TABS: ORAL_TABLET | ORAL | Status: DC

## 2012-11-25 MED ORDER — CEFTRIAXONE SODIUM 1 G IJ SOLR
1.0000 g | Freq: Once | INTRAMUSCULAR | Status: AC
  Administered 2012-11-25: 1 g via INTRAMUSCULAR

## 2012-11-25 MED ORDER — AZITHROMYCIN 500 MG PO TABS: 500.0000 mg | ORAL_TABLET | Freq: Every day | ORAL | Status: DC

## 2012-11-25 NOTE — Patient Instructions (Signed)

## 2012-11-25 NOTE — Progress Notes (Signed)
  Subjective:    Patient ID: Ronald Hull, male    DOB: 02-21-1946, 67 y.o.   MRN: [id number]  HPI Had improved but worsened lately last 24 hrs. More hoarse, chest congestion. Sputum yellow, more of it, chills and nite sweats have resolved and has more nrg. Traveling to [place] 10 days.  Review of Systems     Objective:   Physical Exam  Vitals reviewed. Constitutional: He is oriented to person, place, and time. He appears well-nourished. No distress.  HENT:  Right Ear: External ear normal.  Left Ear: External ear normal.  Mouth/Throat: Oropharynx is clear and moist.  Eyes: EOM are normal.  Neck: Normal range of motion. Neck supple.  Cardiovascular: Normal rate, regular rhythm and normal heart sounds.   Pulmonary/Chest: Breath sounds normal. He is in respiratory distress. He has no rales.  Musculoskeletal: Normal range of motion.  Neurological: He is alert and oriented to person, place, and time. He exhibits normal muscle tone. Coordination normal.  Psychiatric: He has a normal mood and affect.     [facility] reading (PRIMARY) by  Dr Verlon Au no change, no worse  Nebulizer feels better after.      Assessment & Plan:  Rocephin 1g Prednisone 6d taper Reasses 1 week sooner prn

## 2015-02-12 ENCOUNTER — Ambulatory Visit (INDEPENDENT_AMBULATORY_CARE_PROVIDER_SITE_OTHER): Payer: Medicare Other | Admitting: Physician Assistant

## 2015-02-12 VITALS — BP 124/78 | HR 90 | Temp 98.5°F | Resp 17 | Ht 70.5 in | Wt 295.0 lb

## 2015-02-12 DIAGNOSIS — J069 Acute upper respiratory infection, unspecified: Secondary | ICD-10-CM

## 2015-02-12 MED ORDER — BENZONATATE 100 MG PO CAPS: 100.0000 mg | ORAL_CAPSULE | Freq: Three times a day (TID) | ORAL | Status: DC | PRN

## 2015-02-12 MED ORDER — HYDROCOD POLST-CPM POLST ER 10-8 MG/5ML PO SUER: 5.0000 mL | Freq: Every evening | ORAL | Status: DC | PRN

## 2015-02-12 NOTE — Progress Notes (Signed)
   Subjective:    Patient ID: Ronald Hull, male    DOB: 01-Jul-1945, 69 y.o.   MRN: [id number]  HPI Patient presents for productive cough that has been present for the past 3 days. Cough is worse at night and he believes he has bronchitis. Says that he had congestion and sinus pressure for past week that has resolved. Now has some rhinorrhea, feeling hot and cold, and sore throat. Has SOB with deep breathing which makes him cough. Denies N/V, HA, otalgia, CP. Has taken Sudafed which has helped. H/o asthma and allergies. NKDA.   Review of Systems As noted above.    Objective:   Physical Exam  Constitutional: He is oriented to person, place, and time. He appears well-developed and well-nourished. No distress.  Blood pressure 124/78, pulse 90, temperature 98.5 F (36.9 C), temperature source Oral, resp. rate 17, height 5' 10.5" (1.791 m), weight 295 lb (133.811 kg), SpO2 97 %.  HENT:  Head: Normocephalic and atraumatic.  Right Ear: Tympanic membrane, external ear and ear canal normal.  Left Ear: Tympanic membrane and ear canal normal.  Nose: Rhinorrhea present. No mucosal edema. Right sinus exhibits no maxillary sinus tenderness and no frontal sinus tenderness. Left sinus exhibits no maxillary sinus tenderness and no frontal sinus tenderness.  Mouth/Throat: Uvula is midline and mucous membranes are normal. Posterior oropharyngeal erythema present. No oropharyngeal exudate or posterior oropharyngeal edema.  Eyes: Conjunctivae are normal. Pupils are equal, round, and reactive to light. Right eye exhibits no discharge. Left eye exhibits no discharge. No scleral icterus.  Neck: Normal range of motion. Neck supple. No thyromegaly present.  Cardiovascular: Normal rate, regular rhythm and normal heart sounds.  Exam reveals no gallop and no friction rub.   No murmur heard. Pulmonary/Chest: Effort normal and breath sounds normal. No respiratory distress. He has no wheezes. He has no rales. He  exhibits no tenderness.  Abdominal: Soft. Bowel sounds are normal. He exhibits no distension and no mass. There is no tenderness. There is no rebound and no guarding.  Lymphadenopathy:    He has no cervical adenopathy.  Neurological: He is alert and oriented to person, place, and time.  Skin: Skin is warm and dry. No rash noted. He is not diaphoretic. No erythema. No pallor.       Assessment & Plan:  1. Acute upper respiratory infection Plenty of fluid and rest. Explained to patient was URI is versus bronchitis and that his lung exam is normal. He is adamant that he needs antibiotics. Advised that if has no improvement of sx by end of week will send. Azithromycin, however, with just 3 days of cough should allow more time.  - benzonatate (TESSALON) 100 MG capsule; Take 1-2 capsules (100-200 mg total) by mouth 3 (three) times daily as needed for cough.  Dispense: 40 capsule; Refill: 0 - chlorpheniramine-HYDROcodone (TUSSIONEX PENNKINETIC ER) 10-8 MG/5ML SUER; Take 5 mLs by mouth at bedtime as needed for cough.  Dispense: 75 mL; Refill: 0   Gianni Mihalik PA-C  [facility] 02/12/2015 4:18 PM

## 2015-02-12 NOTE — Patient Instructions (Signed)
Upper Respiratory Infection, Adult Most upper respiratory infections (URIs) are a viral infection of the air passages leading to the lungs. A URI affects the nose, throat, and upper air passages. The most common type of URI is nasopharyngitis and is typically referred to as "the common cold." URIs run their course and usually go away on their own. Most of the time, a URI does not require medical attention, but sometimes a bacterial infection in the upper airways can follow a viral infection. This is called a secondary infection. Sinus and middle ear infections are common types of secondary upper respiratory infections. Bacterial pneumonia can also complicate a URI. A URI can worsen asthma and chronic obstructive pulmonary disease (COPD). Sometimes, these complications can require emergency medical care and may be life threatening.  CAUSES Almost all URIs are caused by viruses. A virus is a type of germ and can spread from one person to another.  RISKS FACTORS You may be at risk for a URI if:   You smoke.   You have chronic heart or lung disease.  You have a weakened defense (immune) system.   You are very young or very old.   You have nasal allergies or asthma.  You work in crowded or poorly ventilated areas.  You work in health care facilities or schools. SIGNS AND SYMPTOMS  Symptoms typically develop 2-3 days after you come in contact with a cold virus. Most viral URIs last 7-10 days. However, viral URIs from the influenza virus (flu virus) can last 14-18 days and are typically more severe. Symptoms may include:   Runny or stuffy (congested) nose.   Sneezing.   Cough.   Sore throat.   Headache.   Fatigue.   Fever.   Loss of appetite.   Pain in your forehead, behind your eyes, and over your cheekbones (sinus pain).  Muscle aches.  DIAGNOSIS  Your health care provider may diagnose a URI by:  Physical exam.  Tests to check that your symptoms are not due to  another condition such as:  Strep throat.  Sinusitis.  Pneumonia.  Asthma. TREATMENT  A URI goes away on its own with time. It cannot be cured with medicines, but medicines may be prescribed or recommended to relieve symptoms. Medicines may help:  Reduce your fever.  Reduce your cough.  Relieve nasal congestion. HOME CARE INSTRUCTIONS   Take medicines only as directed by your health care provider.   Gargle warm saltwater or take cough drops to comfort your throat as directed by your health care provider.  Use a warm mist humidifier or inhale steam from a shower to increase air moisture. This may make it easier to breathe.  Drink enough fluid to keep your urine clear or pale yellow.   Eat soups and other clear broths and maintain good nutrition.   Rest as needed.   Return to work when your temperature has returned to normal or as your health care provider advises. You may need to stay home longer to avoid infecting others. You can also use a face mask and careful hand washing to prevent spread of the virus.  Increase the usage of your inhaler if you have asthma.   Do not use any tobacco products, including cigarettes, chewing tobacco, or electronic cigarettes. If you need help quitting, ask your health care provider. PREVENTION  The best way to protect yourself from getting a cold is to practice good hygiene.   Avoid oral or hand contact with people with cold   symptoms.   Wash your hands often if contact occurs.  There is no clear evidence that vitamin C, vitamin E, echinacea, or exercise reduces the chance of developing a cold. However, it is always recommended to get plenty of rest, exercise, and practice good nutrition.  SEEK MEDICAL CARE IF:   You are getting worse rather than better.   Your symptoms are not controlled by medicine.   You have chills.  You have worsening shortness of breath.  You have brown or red mucus.  You have yellow or brown nasal  discharge.  You have pain in your face, especially when you bend forward.  You have a fever.  You have swollen neck glands.  You have pain while swallowing.  You have white areas in the back of your throat. SEEK IMMEDIATE MEDICAL CARE IF:   You have severe or persistent:  Headache.  Ear pain.  Sinus pain.  Chest pain.  You have chronic lung disease and any of the following:  Wheezing.  Prolonged cough.  Coughing up blood.  A change in your usual mucus.  You have a stiff neck.  You have changes in your:  Vision.  Hearing.  Thinking.  Mood. MAKE SURE YOU:   Understand these instructions.  Will watch your condition.  Will get help right away if you are not doing well or get worse.   This information is not intended to replace advice given to you by your health care provider. Make sure you discuss any questions you have with your health care provider.   Document Released: 10/08/2000 Document Revised: 08/29/2014 Document Reviewed: 07/20/2013 Elsevier Interactive Patient Education 2016 [employer]  

## 2015-06-22 DIAGNOSIS — G8929 Other chronic pain: Secondary | ICD-10-CM | POA: Diagnosis not present

## 2015-06-22 DIAGNOSIS — M25561 Pain in right knee: Secondary | ICD-10-CM | POA: Diagnosis not present

## 2015-10-02 DIAGNOSIS — I499 Cardiac arrhythmia, unspecified: Secondary | ICD-10-CM | POA: Diagnosis not present

## 2015-10-03 ENCOUNTER — Other Ambulatory Visit (HOSPITAL_COMMUNITY): Payer: Self-pay | Admitting: Internal Medicine

## 2015-10-03 ENCOUNTER — Telehealth: Payer: Self-pay | Admitting: Internal Medicine

## 2015-10-03 DIAGNOSIS — I499 Cardiac arrhythmia, unspecified: Secondary | ICD-10-CM

## 2015-10-03 NOTE — Telephone Encounter (Signed)
Received records from [facility] for appointment on 10/31/15 with Dr Hilty.  Records given to N Hines (medical records) for Dr Hilty's schedule on 10/31/15. lp °

## 2015-10-05 ENCOUNTER — Encounter: Payer: Self-pay | Admitting: Cardiology

## 2015-10-08 NOTE — Progress Notes (Signed)
Electrophysiology Office Note   Date:  10/09/2015   ID:  Ronald Hull, DOB 03/25/1946, MRN [id number]  PCP:  Benjaman Kindler, MD   Primary Electrophysiologist:  Ariadne Rissmiller [name], MD    Chief Complaint  Patient presents with  . Advice Only     History of Present Illness: Ronald Hull is a 70 y.o. male who presents today for electrophysiology evaluation.   He presented to his primary physician on 10/02/15 with an elevated and her regular heart rate. He had been monitoring his heart rate and blood pressure in both had been very irregular. He has had palpitations, fatigue, and not feeling well. An EKG was done which showed possible atrial fibrillation. He was started on Eliquis.He says that 10 days ago, he was walking up the stairs of his building and noted that his heart rate was elevated into the 140s. He was having significant shortness of breath and fatigue at the time. He went to see his primary physician and was put on Eliquis. Since seeing his primary physician, his heart rate has been much better controlled and he is felt less fatigued.   Today, he denies symptoms of chest pain, shortness of breath, orthopnea, PND, lower extremity edema, claudication, dizziness, presyncope, syncope, bleeding, or neurologic sequela. The patient is tolerating medications without difficulties and is otherwise without complaint today.    Past Medical History  Diagnosis Date  . Hypertension   . Hypercholesteremia   . Sleep apnea     does use a cpap  . Arthritis   . Wears glasses   . Complication of anesthesia     BP dropped post colonoscopy   Past Surgical History  Procedure Laterality Date  . Colonoscopy    . Knee arthroscopy  2000  . Shoulder arthroscopy with rotator cuff repair and subacromial decompression Right 07/15/2012    Procedure: RIGHT ARTHROSCOPY SHOULDER DECOMPRESSION  SUBACROMIAL PARTIAL ACROMIOPLASTY WITH CORACOACROMIAL RELEASE,  DISTAL CLAVICULECTOMY, resect biceps debride  labrium WITH ROTATOR CUFF REPAIR;  Surgeon: [name], MD;  Location:  SUR[facility]ERY [facility];  Service: Orthopedics;  Laterality: Right;     Current Outpatient Prescriptions  Medication Sig Dispense Refill  . apixaban (ELIQUIS) 5 MG TABS tablet Take 1 tablet (5 mg total) by mouth 2 (two) times daily. 60 tablet 3  . atorvastatin (LIPITOR) 20 MG tablet Take 20 mg by mouth every evening.    . zaleplon (SONATA) 5 MG capsule Take 1 capsule by mouth as directed.  1  . flecainide (TAMBOCOR) 50 MG tablet Take 1.5 tablets (75 mg total) by mouth 2 (two) times daily. 90 tablet 3  . metoprolol tartrate (LOPRESSOR) 25 MG tablet Take 1.5 tablets (37.5 mg total) by mouth 2 (two) times daily. 90 tablet 3   Current Facility-Administered Medications  Medication Dose Route Frequency Provider Last Rate Last Dose  . cefTRIAXone (ROCEPHIN) injection 1 g  1 g Intramuscular Q24H Jonita Albee, MD   1 g at 11/18/12 1032    Allergies:   Review of patient's allergies indicates no known allergies.   Social History:  The patient  reports that he quit smoking about 42 years ago. He does not have any smokeless tobacco history on file. He reports that he drinks alcohol. He reports that he does not use illicit drugs.   Family History:  The patient's family history includes Cancer in his sister; Clotting disorder in his mother; Heart disease in his brother; Heart failure in his father; Hypertension in his  sister; Other in his mother.    ROS:  Please see the history of present illness.   Otherwise, review of systems is positive for palpitations.   All other systems are reviewed and negative.    PHYSICAL EXAM: VS:  BP 132/94 mmHg  Pulse 98  Ht  (1.803 m)  Wt 284 lb 6.4 oz (129.003 kg)  BMI 39.68 kg/m2 , BMI Body mass index is 39.68 kg/(m^2). GEN: Well nourished, well developed, in no acute distress HEENT: normal Neck: no JVD, carotid bruits, or masses Cardiac: irregular rhythm; no murmurs, rubs, or  gallops,no edema  Respiratory:  clear to auscultation bilaterally, normal work of breathing GI: soft, nontender, nondistended, + BS MS: no deformity or atrophy Skin: warm and dry Neuro:  Strength and sensation are intact Psych: euthymic mood, full affect  EKG:  EKG is ordered today. The ekg ordered today shows atrial fibrillation, rate 98  Recent Labs: No results found for requested labs within last 365 days.    Lipid Panel  No results found for: CHOL, TRIG, HDL, CHOLHDL, VLDL, LDLCALC, LDLDIRECT   Wt Readings from Last 3 Encounters:  10/09/15 284 lb 6.4 oz (129.003 kg)  02/12/15 295 lb (133.811 kg)  11/25/12 281 lb (127.461 kg)      Other studies Reviewed: Additional studies/ records that were reviewed today include: PCP notes  ASSESSMENT AND PLAN:  1.  Persistent atrial fibrillation: Currently on verapamil and apixaban for anticoagulation. His rates are still elevated today, and therefore [name] add 25 mg of metoprolol twice a day. His anticoagulation was started one week ago. It seems, based off of his history, he would feel much better in sinus rhythm. We [name] have to wait at least 3 weeks from the start of his anticoagulation to consider returned to sinus rhythm. We'll schedule him for a cardioversion in 2 half to 3 weeks. We'll also start him on flecainide 75 mg twice a day just before his cardioversion, stop verapamil and start metoprolol. He is scheduled to get an echocardiogram on June 28. We'll follow up on those results.  This patients CHA2DS2-VASc Score and unadjusted Ischemic Stroke Rate (% per year) is equal to 2.2 % stroke rate/year from a score of 2  Above score calculated as 1 point each if present [CHF, HTN, DM, Vascular=MI/PAD/Aortic Plaque, Age if 65-74, or Male] Above score calculated as 2 points each if present [Age > 75, or Stroke/TIA/TE]       Current medicines are reviewed at length with the patient today.   The patient does not have concerns  regarding his medicines.  The following changes were made today:  Metoprolol 25 mg BID, flecainide 75 mg BID to start in 3 weeks, stop verapamil  Labs/ tests ordered today include:  Orders Placed This Encounter  Procedures  . Basic metabolic panel  . CBC w/Diff  . EXERCISE TOLERANCE TEST  . EKG 12-Lead     Disposition:   FU with Sidharth Leverette 3 months  Signed, Janvi Ammar [name], MD  10/09/2015 12:18 PM     [facility] [address] [place] [address] [phone] (office) [phone] (fax)

## 2015-10-09 ENCOUNTER — Ambulatory Visit (INDEPENDENT_AMBULATORY_CARE_PROVIDER_SITE_OTHER): Payer: Medicare Other | Admitting: Cardiology

## 2015-10-09 ENCOUNTER — Encounter: Payer: Self-pay | Admitting: Cardiology

## 2015-10-09 VITALS — BP 132/94 | HR 98 | Ht 71.0 in | Wt 284.4 lb

## 2015-10-09 DIAGNOSIS — I481 Persistent atrial fibrillation: Secondary | ICD-10-CM

## 2015-10-09 DIAGNOSIS — I4819 Other persistent atrial fibrillation: Secondary | ICD-10-CM

## 2015-10-09 DIAGNOSIS — Z01812 Encounter for preprocedural laboratory examination: Secondary | ICD-10-CM

## 2015-10-09 MED ORDER — METOPROLOL TARTRATE 25 MG PO TABS: 37.5000 mg | ORAL_TABLET | Freq: Two times a day (BID) | ORAL | Status: DC

## 2015-10-09 MED ORDER — FLECAINIDE ACETATE 50 MG PO TABS: 75.0000 mg | ORAL_TABLET | Freq: Two times a day (BID) | ORAL | Status: DC

## 2015-10-09 MED ORDER — APIXABAN 5 MG PO TABS: 5.0000 mg | ORAL_TABLET | Freq: Two times a day (BID) | ORAL | Status: DC

## 2015-10-09 NOTE — Patient Instructions (Addendum)
Medication Instructions:  Your physician has recommended you make the following change in your medication:  1) STOP Verapamil 2) START Metoprolol 37.5 mg twice a day 3) START Flecainide 75 mg twice a day  (do not start this medication until nurse calls you)   --- Roanna Raider, RN will call you to review when to start these medications ----  Labwork: None ordered  Testing/Procedures: Your physician has recommended that you have a Cardioversion (DCCV). Electrical Cardioversion uses a jolt of electricity to your heart either through paddles or wired patches attached to your chest. This is a controlled, usually prescheduled, procedure. Defibrillation is done under light anesthesia in the hospital, and you usually go home the day of the procedure. This is done to get your heart back into a normal rhythm. You are not awake for the procedure. Please see the instruction sheet given to you today.  Your physician has requested that you have an exercise tolerance test. For further information please visit [email or web address] Please also follow instruction sheet, as given.  Follow-Up: Your physician recommends that you schedule a follow-up appointment in: 3 months with Dr. Elberta Fortis.  If you need a refill on your cardiac medications before your next appointment, please call your pharmacy.  Thank you for choosing [facility]!!   Dory Horn, RN [phone]   Any Other Special Instructions Will Be Listed Below (If Applicable). Metoprolol tablets What is this medicine? METOPROLOL (me TOE proe lole) is a beta-blocker. Beta-blockers reduce the workload on the heart and help it to beat more regularly. This medicine is used to treat high blood pressure and to prevent chest pain. It is also used to after a heart attack and to prevent an additional heart attack from occurring. This medicine may be used for other purposes; ask your health care provider or pharmacist if you have questions. What should I  tell my health care provider before I take this medicine? They need to know if you have any of these conditions: -diabetes -heart or vessel disease like slow heart rate, worsening heart failure, heart block, sick sinus syndrome or Raynaud's disease -kidney disease -liver disease -lung or breathing disease, like asthma or emphysema -pheochromocytoma -thyroid disease -an unusual or allergic reaction to metoprolol, other beta-blockers, medicines, foods, dyes, or preservatives -pregnant or trying to get pregnant -breast-feeding How should I use this medicine? Take this medicine by mouth with a drink of water. Follow the directions on the prescription label. Take this medicine immediately after meals. Take your doses at regular intervals. Do not take more medicine than directed. Do not stop taking this medicine suddenly. This could lead to serious heart-related effects. Talk to your pediatrician regarding the use of this medicine in children. Special care may be needed. Overdosage: If you think you have taken too much of this medicine contact a poison control center or emergency room at once. NOTE: This medicine is only for you. Do not share this medicine with others. What if I miss a dose? If you miss a dose, take it as soon as you can. If it is almost time for your next dose, take only that dose. Do not take double or extra doses. What may interact with this medicine? This medicine may interact with the following medications: -certain medicines for blood pressure, heart disease, irregular heart beat -certain medicines for depression like monoamine oxidase (MAO) inhibitors, fluoxetine, or paroxetine -clonidine -dobutamine -epinephrine -isoproterenol -reserpine This list may not describe all possible interactions. Give your health care provider a list  of all the medicines, herbs, non-prescription drugs, or dietary supplements you use. Also tell them if you smoke, drink alcohol, or use illegal  drugs. Some items may interact with your medicine. What should I watch for while using this medicine? Visit your doctor or health care professional for regular check ups. Contact your doctor right away if your symptoms worsen. Check your blood pressure and pulse rate regularly. Ask your health care professional what your blood pressure and pulse rate should be, and when you should contact them. You may get drowsy or dizzy. Do not drive, use machinery, or do anything that needs mental alertness until you know how this medicine affects you. Do not sit or stand up quickly, especially if you are an older patient. This reduces the risk of dizzy or fainting spells. Contact your doctor if these symptoms continue. Alcohol may interfere with the effect of this medicine. Avoid alcoholic drinks. What side effects may I notice from receiving this medicine? Side effects that you should report to your doctor or health care professional as soon as possible: -allergic reactions like skin rash, itching or hives -cold or numb hands or feet -depression -difficulty breathing -faint -fever with sore throat -irregular heartbeat, chest pain -rapid weight gain -swollen legs or ankles Side effects that usually do not require medical attention (report to your doctor or health care professional if they continue or are bothersome): -anxiety or nervousness -change in sex drive or performance -dry skin -headache -nightmares or trouble sleeping -short term memory loss -stomach upset or diarrhea -unusually tired This list may not describe all possible side effects. Call your doctor for medical advice about side effects. You may report side effects to FDA at 1-800-FDA-1088. Where should I keep my medicine? Keep out of the reach of children. Store at room temperature between 15 and 30 degrees C (59 and 86 degrees F). Throw away any unused medicine after the expiration date. NOTE: This sheet is a summary. It may not cover  all possible information. If you have questions about this medicine, talk to your doctor, pharmacist, or health care provider.    2016, Elsevier/Gold Standard. (2012-12-17 14:40:36)  Flecainide tablets What is this medicine? FLECAINIDE (FLEK a nide) is an antiarrhythmic drug. This medicine is used to prevent irregular heart rhythm. It can also slow down fast heartbeats called tachycardia. This medicine may be used for other purposes; ask your health care provider or pharmacist if you have questions. What should I tell my health care provider before I take this medicine? They need to know if you have any of these conditions: -abnormal levels of potassium in the blood -heart disease including heart rhythm and heart rate problems -kidney or liver disease -recent heart attack -an unusual or allergic reaction to flecainide, local anesthetics, other medicines, foods, dyes, or preservatives -pregnant or trying to get pregnant -breast-feeding How should I use this medicine? Take this medicine by mouth with a glass of water. Follow the directions on the prescription label. You can take this medicine with or without food. Take your doses at regular intervals. Do not take your medicine more often than directed. Do not stop taking this medicine suddenly. This may cause serious, heart-related side effects. If your doctor wants you to stop the medicine, the dose may be slowly lowered over time to avoid any side effects. Talk to your pediatrician regarding the use of this medicine in children. While this drug may be prescribed for children as young as 1 year of age for  selected conditions, precautions do apply. Overdosage: If you think you have taken too much of this medicine contact a poison control center or emergency room at once. NOTE: This medicine is only for you. Do not share this medicine with others. What if I miss a dose? If you miss a dose, take it as soon as you can. If it is almost time for  your next dose, take only that dose. Do not take double or extra doses. What may interact with this medicine? Do not take this medicine with any of the following medications: -amoxapine -arsenic trioxide -certain antibiotics like clarithromycin, erythromycin, gatifloxacin, gemifloxacin, levofloxacin, moxifloxacin, sparfloxacin, or troleandomycin -certain antidepressants called tricyclic antidepressants like amitriptyline, imipramine, or nortriptyline -certain medicines to control heart rhythm like disopyramide, dofetilide, encainide, moricizine, procainamide, propafenone, and quinidine -cisapride -cyclobenzaprine -delavirdine -droperidol -haloperidol -hawthorn -imatinib -levomethadyl -maprotiline -medicines for malaria like chloroquine and halofantrine -pentamidine -phenothiazines like chlorpromazine, mesoridazine, prochlorperazine, thioridazine -pimozide -quinine -ranolazine -ritonavir -sertindole -ziprasidone This medicine may also interact with the following medications: -cimetidine -medicines for angina or high blood pressure -medicines to control heart rhythm like amiodarone and digoxin This list may not describe all possible interactions. Give your health care provider a list of all the medicines, herbs, non-prescription drugs, or dietary supplements you use. Also tell them if you smoke, drink alcohol, or use illegal drugs. Some items may interact with your medicine. What should I watch for while using this medicine? Visit your doctor or health care professional for regular checks on your progress. Because your condition and the use of this medicine carries some risk, it is a good idea to carry an identification card, necklace or bracelet with details of your condition, medications and doctor or health care professional. Check your blood pressure and pulse rate regularly. Ask your health care professional what your blood pressure and pulse rate should be, and when you should  contact him or her. Your doctor or health care professional also may schedule regular blood tests and electrocardiograms to check your progress. You may get drowsy or dizzy. Do not drive, use machinery, or do anything that needs mental alertness until you know how this medicine affects you. Do not stand or sit up quickly, especially if you are an older patient. This reduces the risk of dizzy or fainting spells. Alcohol can make you more dizzy, increase flushing and rapid heartbeats. Avoid alcoholic drinks. What side effects may I notice from receiving this medicine? Side effects that you should report to your doctor or health care professional as soon as possible: -chest pain, continued irregular heartbeats -difficulty breathing -swelling of the legs or feet -trembling, shaking -unusually weak or tired Side effects that usually do not require medical attention (report to your doctor or health care professional if they continue or are bothersome): -blurred vision -constipation -headache -nausea, vomiting -stomach pain This list may not describe all possible side effects. Call your doctor for medical advice about side effects. You may report side effects to FDA at 1-800-FDA-1088. Where should I keep my medicine? Keep out of the reach of children. Store at room temperature between 15 and 30 degrees C (59 and 86 degrees F). Protect from light. Keep container tightly closed. Throw away any unused medicine after the expiration date. NOTE: This sheet is a summary. It may not cover all possible information. If you have questions about this medicine, talk to your doctor, pharmacist, or health care provider.    2016, Elsevier/Gold Standard. (2007-08-18 16:46:09)

## 2015-10-18 DIAGNOSIS — I4891 Unspecified atrial fibrillation: Secondary | ICD-10-CM | POA: Diagnosis not present

## 2015-10-23 ENCOUNTER — Telehealth: Payer: Self-pay | Admitting: Cardiology

## 2015-10-23 NOTE — Telephone Encounter (Signed)
PT CALLING RE CARDIOVERSION 11-01-15 ASKING FOR DETAILS AS FAR AS TIME AND WHERE TO GO  PLS CALL BEFORE 9A [phone] AFTER 9A [phone] AWARE CALL WILL BE  10-24-15

## 2015-10-24 ENCOUNTER — Other Ambulatory Visit: Payer: Self-pay

## 2015-10-24 ENCOUNTER — Ambulatory Visit (HOSPITAL_COMMUNITY): Payer: Medicare Other | Attending: Cardiology

## 2015-10-24 DIAGNOSIS — I499 Cardiac arrhythmia, unspecified: Secondary | ICD-10-CM | POA: Insufficient documentation

## 2015-10-24 DIAGNOSIS — I340 Nonrheumatic mitral (valve) insufficiency: Secondary | ICD-10-CM | POA: Insufficient documentation

## 2015-10-24 DIAGNOSIS — I517 Cardiomegaly: Secondary | ICD-10-CM | POA: Diagnosis not present

## 2015-10-24 DIAGNOSIS — I351 Nonrheumatic aortic (valve) insufficiency: Secondary | ICD-10-CM | POA: Diagnosis not present

## 2015-10-24 DIAGNOSIS — I4891 Unspecified atrial fibrillation: Secondary | ICD-10-CM | POA: Diagnosis not present

## 2015-10-24 LAB — ECHOCARDIOGRAM COMPLETE
E decel time: 197 msec
E/e' ratio: 10.09
FS: 21 % — AB (ref 28–44)
IVS/LV PW RATIO, ED: 1.15
LA ID, A-P, ES: 41 mm
LA diam end sys: 41 mm
LA diam index: 1.67 cm/m2
LA vol A4C: 77 ml
LA vol index: 37.1 mL/m2
LA vol: 91 mL
LV E/e' medial: 10.09
LV E/e'average: 10.09
LV PW d: 13.8 mm — AB (ref 0.6–1.1)
LV e' LATERAL: 11.4 cm/s
LVOT SV: 63 mL
LVOT VTI: 20.1 cm
LVOT area: 3.14 cm2
LVOT diameter: 20 mm
LVOT peak vel: 102 cm/s
MV Dec: 197
MV Peak grad: 5 mmHg
MV pk E vel: 115 m/s
P 1/2 time: 212 ms
Reg peak vel: 254 cm/s
TDI e' lateral: 11.4
TDI e' medial: 7.79
TR max vel: 254 cm/s

## 2015-10-24 NOTE — Telephone Encounter (Signed)
Reviewed DCCV instructions w/ pt.  DCCV scheduled for 7/6. Pre labs in the hospital, prior to procedure, per pt request. Pt will start Flecainide on 6/30.  GXT follow up on 7/10. Patient verbalized understanding and agreeable to plan.

## 2015-10-26 ENCOUNTER — Other Ambulatory Visit: Payer: Self-pay | Admitting: Cardiology

## 2015-10-31 ENCOUNTER — Ambulatory Visit: Payer: Medicare Other | Admitting: Internal Medicine

## 2015-11-01 ENCOUNTER — Other Ambulatory Visit: Payer: Self-pay | Admitting: Cardiology

## 2015-11-01 ENCOUNTER — Encounter (HOSPITAL_COMMUNITY): Payer: Self-pay | Admitting: *Deleted

## 2015-11-01 ENCOUNTER — Ambulatory Visit (HOSPITAL_COMMUNITY): Payer: Medicare Other | Admitting: Certified Registered Nurse Anesthetist

## 2015-11-01 ENCOUNTER — Ambulatory Visit (HOSPITAL_COMMUNITY)
Admission: RE | Admit: 2015-11-01 | Discharge: 2015-11-01 | Disposition: A | Discharge status: Home / Self Care | Payer: Medicare Other | Source: Ambulatory Visit | Attending: Internal Medicine | Admitting: Internal Medicine

## 2015-11-01 ENCOUNTER — Encounter (HOSPITAL_COMMUNITY)
Admission: RE | Disposition: A | Discharge status: Home / Self Care | Payer: Self-pay | Source: Ambulatory Visit | Attending: Internal Medicine

## 2015-11-01 DIAGNOSIS — I4891 Unspecified atrial fibrillation: Secondary | ICD-10-CM | POA: Diagnosis not present

## 2015-11-01 DIAGNOSIS — I10 Essential (primary) hypertension: Secondary | ICD-10-CM | POA: Diagnosis not present

## 2015-11-01 DIAGNOSIS — I481 Persistent atrial fibrillation: Secondary | ICD-10-CM

## 2015-11-01 DIAGNOSIS — E7800 Pure hypercholesterolemia, unspecified: Secondary | ICD-10-CM | POA: Insufficient documentation

## 2015-11-01 DIAGNOSIS — Z87891 Personal history of nicotine dependence: Secondary | ICD-10-CM | POA: Diagnosis not present

## 2015-11-01 DIAGNOSIS — Z7901 Long term (current) use of anticoagulants: Secondary | ICD-10-CM | POA: Diagnosis not present

## 2015-11-01 DIAGNOSIS — Z79899 Other long term (current) drug therapy: Secondary | ICD-10-CM

## 2015-11-01 DIAGNOSIS — G4730 Sleep apnea, unspecified: Secondary | ICD-10-CM | POA: Insufficient documentation

## 2015-11-01 DIAGNOSIS — Z5181 Encounter for therapeutic drug level monitoring: Secondary | ICD-10-CM

## 2015-11-01 DIAGNOSIS — I4819 Other persistent atrial fibrillation: Secondary | ICD-10-CM | POA: Insufficient documentation

## 2015-11-01 HISTORY — PX: CARDIOVERSION: SHX1299

## 2015-11-01 SURGERY — CARDIOVERSION
Anesthesia: General

## 2015-11-01 MED ORDER — SODIUM CHLORIDE 0.9 % IV SOLN
INTRAVENOUS | Status: DC | PRN
  Administered 2015-11-01: 13:00:00 via INTRAVENOUS

## 2015-11-01 MED ORDER — PROPOFOL 10 MG/ML IV BOLUS
INTRAVENOUS | Status: DC | PRN
  Administered 2015-11-01: 70 mg via INTRAVENOUS

## 2015-11-01 MED ORDER — LIDOCAINE HCL (CARDIAC) 20 MG/ML IV SOLN
INTRAVENOUS | Status: DC | PRN
  Administered 2015-11-01: 50 mg via INTRAVENOUS

## 2015-11-01 NOTE — Discharge Instructions (Signed)
Electrical Cardioversion, Care After °Refer to this sheet in the next few weeks. These instructions provide you with information on caring for yourself after your procedure. Your health care provider may also give you more specific instructions. Your treatment has been planned according to current medical practices, but problems sometimes occur. Call your health care provider if you have any problems or questions after your procedure. °WHAT TO EXPECT AFTER THE PROCEDURE °After your procedure, it is typical to have the following sensations: °· Some redness on the skin where the shocks were delivered. If this is tender, a sunburn lotion or hydrocortisone cream may help. °· Possible return of an abnormal heart rhythm within hours or days after the procedure. °HOME CARE INSTRUCTIONS °· Take medicines only as directed by your health care provider. Be sure you understand how and when to take your medicine. °· Learn how to feel your pulse and check it often. °· Limit your activity for 48 hours after the procedure or as directed by your health care provider. °· Avoid or minimize caffeine and other stimulants as directed by your health care provider. °SEEK MEDICAL CARE IF: °· You feel like your heart is beating too fast or your pulse is not regular. °· You have any questions about your medicines. °· You have bleeding that will not stop. °SEEK IMMEDIATE MEDICAL CARE IF: °· You are dizzy or feel faint. °· It is hard to breathe or you feel short of breath. °· There is a change in discomfort in your chest. °· Your speech is slurred or you have trouble moving an arm or leg on one side of your body. °· You get a serious muscle cramp that does not go away. °· Your fingers or toes turn cold or blue. °  °This information is not intended to replace advice given to you by your health care provider. Make sure you discuss any questions you have with your health care provider. °  °Document Released: 02/02/2013 Document Revised: 05/05/2014  Document Reviewed: 02/02/2013 °Elsevier Interactive Patient Education ©2016 Elsevier Inc. ° °

## 2015-11-01 NOTE — Transfer of Care (Signed)
Immediate Anesthesia Transfer of Care Note  Patient: Ronald Hull  Procedure(s) Performed: Procedure(s): CARDIOVERSION (N/A)  Patient Location: Endo 56  Anesthesia Type:General  Level of Consciousness: awake, alert , oriented and patient cooperative  Airway & Oxygen Therapy: Patient Spontanous Breathing and Patient connected to nasal cannula oxygen  Post-op Assessment: Report given to RN  Post vital signs: stable  Last Vitals:  Filed Vitals:   11/01/15 1253 11/01/15 1334  BP: 154/111 135/83  Temp: 36.5 C   Resp: 18 20    Last Pain: There were no vitals filed for this visit.       Complications: No apparent anesthesia complications

## 2015-11-01 NOTE — H&P (Signed)
     INTERVAL PROCEDURE H&P  History and Physical Interval Note:  11/01/2015 1:14 PM  Ronald Hull has presented today for their planned procedure. The various methods of treatment have been discussed with the patient and family. After consideration of risks, benefits and other options for treatment, the patient has consented to the procedure.  The patients' outpatient history has been reviewed, patient examined, and no change in status from most recent office note within the past 30 days. I have reviewed the patients' chart and labs and will proceed as planned. Questions were answered to the patient's satisfaction.   [name], MD, [facility] Attending Cardiologist [facility]  Lisette Abu Hilty 11/01/2015, 1:14 PM

## 2015-11-01 NOTE — Anesthesia Postprocedure Evaluation (Signed)
Anesthesia Post Note  Patient: Ronald Hull  Procedure(s) Performed: Procedure(s) (LRB): CARDIOVERSION (N/A)  Patient location during evaluation: Endoscopy Anesthesia Type: MAC Level of consciousness: awake Pain management: pain level controlled Vital Signs Assessment: post-procedure vital signs reviewed and stable Respiratory status: spontaneous breathing Cardiovascular status: stable Anesthetic complications: no    Last Vitals:  Filed Vitals:   11/01/15 1355 11/01/15 1405  BP: 152/101 150/84  Pulse: 90 86  Temp:    Resp: 22 20    Last Pain: There were no vitals filed for this visit.               EDWARDS,Chandlar Guice

## 2015-11-01 NOTE — Anesthesia Preprocedure Evaluation (Signed)
Anesthesia Evaluation  Patient identified by MRN, date of birth, ID band Patient awake    Reviewed: Allergy & Precautions, NPO status , Patient's Chart, lab work & pertinent test results  Airway Mallampati: II  TM Distance: >3 FB Neck ROM: Full    Dental   Pulmonary sleep apnea , former smoker,    breath sounds clear to auscultation       Cardiovascular hypertension,  Rhythm:Irregular Rate:Normal     Neuro/Psych    GI/Hepatic negative GI ROS, Neg liver ROS,   Endo/Other  negative endocrine ROS  Renal/GU negative Renal ROS     Musculoskeletal   Abdominal   Peds  Hematology   Anesthesia Other Findings   Reproductive/Obstetrics                             Anesthesia Physical Anesthesia Plan  ASA: III  Anesthesia Plan:    Post-op Pain Management:    Induction: Intravenous  Airway Management Planned: Simple Face Mask  Additional Equipment:   Intra-op Plan:   Post-operative Plan:   Informed Consent: I have reviewed the patients History and Physical, chart, labs and discussed the procedure including the risks, benefits and alternatives for the proposed anesthesia with the patient or authorized representative who has indicated his/her understanding and acceptance.   Dental advisory given  Plan Discussed with: CRNA, Anesthesiologist and Surgeon  Anesthesia Plan Comments:         Anesthesia Quick Evaluation

## 2015-11-01 NOTE — CV Procedure (Signed)
    CARDIOVERSION NOTE  Procedure: Electrical Cardioversion Indications:  Atrial Fibrillation  Procedure Details:  Consent: Risks of procedure as well as the alternatives and risks of each were explained to the (patient/caregiver).  Consent for procedure obtained.  Time Out: Verified patient identification, verified procedure, site/side was marked, verified correct patient position, special equipment/implants available, medications/allergies/relevent history reviewed, required imaging and test results available.  Performed  Patient placed on cardiac monitor, pulse oximetry, supplemental oxygen as necessary.  Sedation given: Propofol per anesthesia Pacer pads placed anterior and posterior chest.  Cardioverted 2 time(s).  Cardioverted at 150J, 200J biphasic.  Impression: Findings: Post procedure EKG shows: Atrial Fibrillation with rapid ventricular response, no significant pause or period of sinus rhythm was noted Complications: None Patient did tolerate procedure well.  Plan: 1. Unsuccessful DCCV x 2 - patient maintained a-fib with borderline RVR 2. Follow-up with Dr. Elberta Fortis  Time Spent Directly with the Patient:  30 minutes   [name], MD, [facility] Attending Cardiologist [facility]  [name] 11/01/2015, 1:36 PM

## 2015-11-02 ENCOUNTER — Telehealth: Payer: Self-pay | Admitting: *Deleted

## 2015-11-02 ENCOUNTER — Encounter (HOSPITAL_COMMUNITY): Payer: Self-pay | Admitting: Internal Medicine

## 2015-11-02 ENCOUNTER — Other Ambulatory Visit (HOSPITAL_COMMUNITY): Payer: Self-pay | Admitting: Cardiology

## 2015-11-02 DIAGNOSIS — I4819 Other persistent atrial fibrillation: Secondary | ICD-10-CM

## 2015-11-02 DIAGNOSIS — Z79899 Other long term (current) drug therapy: Secondary | ICD-10-CM

## 2015-11-02 DIAGNOSIS — Z5181 Encounter for therapeutic drug level monitoring: Secondary | ICD-10-CM

## 2015-11-02 MED ORDER — AMIODARONE HCL 200 MG PO TABS: 200.0000 mg | ORAL_TABLET | Freq: Every day | ORAL | Status: DC

## 2015-11-02 MED ORDER — AMIODARONE HCL 200 MG PO TABS: 200.0000 mg | ORAL_TABLET | Freq: Two times a day (BID) | ORAL | Status: DC

## 2015-11-02 MED ORDER — METOPROLOL TARTRATE 25 MG PO TABS: 50.0000 mg | ORAL_TABLET | Freq: Two times a day (BID) | ORAL | Status: DC

## 2015-11-02 NOTE — Addendum Note (Signed)
Addendum  created 11/02/15 4235 by Tillman Abide, CRNA   Modules edited: Anesthesia Events, Narrator   Narrator:  Narrator: Event Log Edited

## 2015-11-02 NOTE — Telephone Encounter (Signed)
Unsuccessful DCCV. Dr. Elberta Fortis orders:  Stop Flecainide; D/C GXT for Monday; Start Amiodarone 200 mg BID x 1 week, then reduce to 200 mg daily; Increase metoprolol to 50 mg BID. Reviewed with patient who verbalized understanding and agreeable to plan. OV made for next week to discuss current condition and care plan.

## 2015-11-05 ENCOUNTER — Encounter (HOSPITAL_COMMUNITY): Payer: Medicare Other

## 2015-11-06 ENCOUNTER — Encounter: Payer: Self-pay | Admitting: *Deleted

## 2015-11-06 ENCOUNTER — Ambulatory Visit (INDEPENDENT_AMBULATORY_CARE_PROVIDER_SITE_OTHER): Payer: Medicare Other | Admitting: Cardiology

## 2015-11-06 ENCOUNTER — Encounter (HOSPITAL_COMMUNITY): Payer: Self-pay | Admitting: Internal Medicine

## 2015-11-06 VITALS — BP 118/96 | HR 91 | Ht 71.0 in | Wt 282.8 lb

## 2015-11-06 DIAGNOSIS — I4819 Other persistent atrial fibrillation: Secondary | ICD-10-CM

## 2015-11-06 DIAGNOSIS — I481 Persistent atrial fibrillation: Secondary | ICD-10-CM | POA: Diagnosis not present

## 2015-11-06 NOTE — Addendum Note (Signed)
Addendum  created 11/06/15 4332 by Adair Laundry, CRNA   Modules edited: SmartForms   SmartForms:  VN Section for SmartForms 146

## 2015-11-06 NOTE — H&P (Signed)
Electrophysiology Office Note   Date:  11/06/2015   ID:  Ronald Hull, DOB [birth year], MRN [id number]  PCP:  Katy Apo, MD   Primary Electrophysiologist:  Regan Lemming, MD    Chief Complaint  Patient presents with  . Follow-up  . Atrial Fibrillation     History of Present Illness: Ronald Hull is a 70 y.o. male who presents today for electrophysiology evaluation.   He presented to his primary physician on 10/02/15 with an elevated and her regular heart rate. He had been monitoring his heart rate and blood pressure in both had been very irregular. He has had palpitations, fatigue, and not feeling well. An EKG was done which showed atrial fibrillation. He was started on Eliquis. He was started on flecainide and had unsuccessful cardioversion 11/01/15.  He continues to feel fatigued and short of breath. His blood pressure and heart rate has been well-controlled with heart rates below 100. He was switched from flecainide to amiodarone.  Today, he denies symptoms of chest pain,  PND, lower extremity edema, claudication, dizziness, presyncope, syncope, bleeding, or neurologic sequela. The patient is tolerating medications without difficulties and is otherwise without complaint today.    Past Medical History  Diagnosis Date  . Hypertension   . Hypercholesteremia   . Arthritis   . Wears glasses   . Complication of anesthesia     BP dropped post colonoscopy  . Sleep apnea     does use a cpap   Past Surgical History  Procedure Laterality Date  . Colonoscopy    . Knee arthroscopy  2000  . Shoulder arthroscopy with rotator cuff repair and subacromial decompression Right 07/15/2012    Procedure: RIGHT ARTHROSCOPY SHOULDER DECOMPRESSION  SUBACROMIAL PARTIAL ACROMIOPLASTY WITH CORACOACROMIAL RELEASE,  DISTAL CLAVICULECTOMY, resect biceps debride labrium WITH ROTATOR CUFF REPAIR;  Surgeon: [name], MD;  Location: [facility];  Service: Orthopedics;   Laterality: Right;  . Cardioversion N/A 11/01/2015    Procedure: CARDIOVERSION;  Surgeon: [name], MD;  Location: [facility] ENDOSCOPY;  Service: Cardiovascular;  Laterality: N/A;     Current Outpatient Prescriptions  Medication Sig Dispense Refill  . amiodarone (PACERONE) 200 MG tablet Take 1 tablet (200 mg total) by mouth 2 (two) times daily. For one week 14 tablet 0  . amiodarone (PACERONE) 200 MG tablet Take 1 tablet (200 mg total) by mouth daily. 30 tablet 6  . apixaban (ELIQUIS) 5 MG TABS tablet Take 1 tablet (5 mg total) by mouth 2 (two) times daily. 60 tablet 3  . atorvastatin (LIPITOR) 20 MG tablet Take 20 mg by mouth every evening.    . metoprolol tartrate (LOPRESSOR) 25 MG tablet Take 2 tablets (50 mg total) by mouth 2 (two) times daily. 120 tablet 3  . zaleplon (SONATA) 5 MG capsule Take 1 capsule by mouth as directed.  1   Current Facility-Administered Medications  Medication Dose Route Frequency Provider Last Rate Last Dose  . cefTRIAXone (ROCEPHIN) injection 1 g  1 g Intramuscular Q24H Jonita Albee, MD   1 g at 11/18/12 1032    Allergies:   Review of patient's allergies indicates no known allergies.   Social History:  The patient  reports that he quit smoking about 42 years ago. He does not have any smokeless tobacco history on file. He reports that he drinks alcohol. He reports that he does not use illicit drugs.   Family History:  The patient's family history includes Cancer in his  sister; Clotting disorder in his mother; Heart disease in his brother; Heart failure in his father; Hypertension in his sister; Other in his mother.    ROS:  Please see the history of present illness.   Otherwise, review of systems is positive for fatigue, seats, chills, SOB, palpitations, DOE, anxiety, balance issues, dizziness.   All other systems are reviewed and negative.    PHYSICAL EXAM: VS:  BP 118/96 mmHg  Pulse 91  Ht 5\' 11"  (1.803 m)  Wt 282 lb 12.8 oz (128.277 kg)  BMI 39.46  kg/m2 , BMI Body mass index is 39.46 kg/(m^2). GEN: Well nourished, well developed, in no acute distress HEENT: normal Neck: no JVD, carotid bruits, or masses Cardiac: irregular rhythm; no murmurs, rubs, or gallops,no edema  Respiratory:  clear to auscultation bilaterally, normal work of breathing GI: soft, nontender, nondistended, + BS MS: no deformity or atrophy Skin: warm and dry Neuro:  Strength and sensation are intact Psych: euthymic mood, full affect  EKG:  EKG is not ordered today.   Recent Labs: No results found for requested labs within last 365 days.    Lipid Panel  No results found for: CHOL, TRIG, HDL, CHOLHDL, VLDL, LDLCALC, LDLDIRECT   Wt Readings from Last 3 Encounters:  11/06/15 282 lb 12.8 oz (128.277 kg)  11/01/15 282 lb (127.914 kg)  10/09/15 284 lb 6.4 oz (129.003 kg)      Other studies Reviewed: Additional studies/ records that were reviewed today include: PCP notes  ASSESSMENT AND PLAN:  1.  Persistent atrial fibrillation: Currently on verapamil and apixaban for anticoagulation, metoprolol and flecainide.  Has failed cardioversion.  I discussed with him the option of changing medications from amiodarone to potentially dofetilide. I have also offered him the option of ablation in the future. He says that he would like to try repeat cardioversion with higher energy and continuing the amiodarone. We'll work to organize repeat cardioversion. If that does not work ablation may be a good option.  This patients CHA2DS2-VASc Score and unadjusted Ischemic Stroke Rate (% per year) is equal to 2.2 % stroke rate/year from a score of 2  Above score calculated as 1 point each if present [CHF, HTN, DM, Vascular=MI/PAD/Aortic Plaque, Age if 65-74, or Male] Above score calculated as 2 points each if present [Age > 75, or Stroke/TIA/TE]  2. Obstructive sleep apnea: Compliant with CPAP     Current medicines are reviewed at length with the patient today.   The  patient does not have concerns regarding his medicines.  The following changes were made today:  Metoprolol 25 mg BID, flecainide 75 mg BID to start in 3 weeks, stop verapamil  Labs/ tests ordered today include:  No orders of the defined types were placed in this encounter.     Disposition:   FU with Quantez Schnyder 3 months  Signed, Blimi Godby [name], MD  11/06/2015 11:36 AM     [facility] [address] [place] [address] [phone] (office) [phone] (fax)

## 2015-11-06 NOTE — Patient Instructions (Signed)
Medication Instructions:  Your physician recommends that you continue on your current medications as directed. Please refer to the Current Medication list given to you today.  Labwork: Pre procedure labs will be done the morning of your procedure, in the hospital.  Testing/Procedures: Your physician has recommended that you have a Cardioversion (DCCV) with Dr. Elberta Fortis & anesthesia, in the EP lab. Electrical Cardioversion uses a jolt of electricity to your heart either through paddles or wired patches attached to your chest. This is a controlled, usually prescheduled, procedure. Defibrillation is done under light anesthesia in the hospital, and you usually go home the day of the procedure. This is done to get your heart back into a normal rhythm. You are not awake for the procedure. Please see the instruction sheet given to you today.  Follow-Up: Your physician wants you to follow-up in: 3 months with Dr. Elberta Fortis. You will receive a reminder letter in the mail two months in advance. If you don't receive a letter, please call our office to schedule the follow-up appointment.  If you need a refill on your cardiac medications before your next appointment, please call your pharmacy.  Thank you for choosing [facility]!!   Dory Horn, RN [phone]

## 2015-11-07 ENCOUNTER — Telehealth: Payer: Self-pay | Admitting: *Deleted

## 2015-11-07 NOTE — Telephone Encounter (Signed)
Informed patient to arrive at 10:30 am for DCCV at noon on 7/28.  (procedure time changed to noon) He understands labs will be done in the hospital prior to procedure. Patient verbalized understanding and agreeable to plan.

## 2015-11-23 ENCOUNTER — Encounter (HOSPITAL_COMMUNITY)
Admission: RE | Disposition: A | Discharge status: Home / Self Care | Payer: Self-pay | Source: Ambulatory Visit | Attending: Cardiology

## 2015-11-23 ENCOUNTER — Encounter (HOSPITAL_COMMUNITY): Payer: Self-pay

## 2015-11-23 ENCOUNTER — Ambulatory Visit (HOSPITAL_COMMUNITY)
Admission: RE | Admit: 2015-11-23 | Discharge: 2015-11-23 | Disposition: A | Discharge status: Home / Self Care | Payer: Medicare Other | Source: Ambulatory Visit | Attending: Cardiology | Admitting: Cardiology

## 2015-11-23 ENCOUNTER — Ambulatory Visit (HOSPITAL_COMMUNITY): Payer: Medicare Other | Admitting: Certified Registered Nurse Anesthetist

## 2015-11-23 DIAGNOSIS — G4730 Sleep apnea, unspecified: Secondary | ICD-10-CM | POA: Insufficient documentation

## 2015-11-23 DIAGNOSIS — Z87891 Personal history of nicotine dependence: Secondary | ICD-10-CM | POA: Insufficient documentation

## 2015-11-23 DIAGNOSIS — I4891 Unspecified atrial fibrillation: Secondary | ICD-10-CM | POA: Diagnosis not present

## 2015-11-23 DIAGNOSIS — I10 Essential (primary) hypertension: Secondary | ICD-10-CM | POA: Diagnosis not present

## 2015-11-23 DIAGNOSIS — I481 Persistent atrial fibrillation: Secondary | ICD-10-CM | POA: Insufficient documentation

## 2015-11-23 HISTORY — PX: CARDIOVERSION: SHX1299

## 2015-11-23 LAB — POCT I-STAT, CHEM 8
BUN: 13 mg/dL (ref 6–20)
Calcium, Ion: 1.14 mmol/L (ref 1.12–1.23)
Chloride: 102 mmol/L (ref 101–111)
Creatinine, Ser: 1 mg/dL (ref 0.61–1.24)
Glucose, Bld: 101 mg/dL — ABNORMAL HIGH (ref 65–99)
HCT: 48 % (ref 39.0–52.0)
Hemoglobin: 16.3 g/dL (ref 13.0–17.0)
Potassium: 4.4 mmol/L (ref 3.5–5.1)
Sodium: 139 mmol/L (ref 135–145)
TCO2: 26 mmol/L (ref 0–100)

## 2015-11-23 SURGERY — CARDIOVERSION
Anesthesia: Monitor Anesthesia Care

## 2015-11-23 SURGERY — CARDIOVERSION (CATH LAB)
Anesthesia: Monitor Anesthesia Care

## 2015-11-23 MED ORDER — PROPOFOL 10 MG/ML IV BOLUS
INTRAVENOUS | Status: DC | PRN
  Administered 2015-11-23 (×4): 20 mg via INTRAVENOUS
  Administered 2015-11-23: 40 mg via INTRAVENOUS
  Administered 2015-11-23: 10 mg via INTRAVENOUS

## 2015-11-23 MED ORDER — SODIUM CHLORIDE 0.9 % IV SOLN
INTRAVENOUS | Status: DC | PRN
Start: 2015-11-23 — End: 2015-11-23
  Administered 2015-11-23: 11:00:00 via INTRAVENOUS

## 2015-11-23 NOTE — Anesthesia Preprocedure Evaluation (Addendum)
Anesthesia Evaluation  Patient identified by MRN, date of birth, ID band Patient awake    Reviewed: Allergy & Precautions, NPO status , Patient's Chart, lab work & pertinent test results  Airway Mallampati: II  TM Distance: >3 FB Neck ROM: Full    Dental   Pulmonary sleep apnea , former smoker,    breath sounds clear to auscultation       Cardiovascular hypertension,  Rhythm:Irregular Rate:Normal     Neuro/Psych    GI/Hepatic negative GI ROS, Neg liver ROS,   Endo/Other  negative endocrine ROS  Renal/GU negative Renal ROS     Musculoskeletal   Abdominal   Peds  Hematology   Anesthesia Other Findings   Reproductive/Obstetrics                             Anesthesia Physical  Anesthesia Plan  ASA: III  Anesthesia Plan: MAC   Post-op Pain Management:    Induction: Intravenous  Airway Management Planned: Simple Face Mask  Additional Equipment:   Intra-op Plan:   Post-operative Plan:   Informed Consent: I have reviewed the patients History and Physical, chart, labs and discussed the procedure including the risks, benefits and alternatives for the proposed anesthesia with the patient or authorized representative who has indicated his/her understanding and acceptance.   Dental advisory given  Plan Discussed with: CRNA, Anesthesiologist and Surgeon  Anesthesia Plan Comments:        Anesthesia Quick Evaluation

## 2015-11-23 NOTE — H&P (Signed)
Ronald Hull has a history of persistent atrial fibrillation.  He presents today for cardioversion.  He has had a prior cardioversion at 200J which did not convert him to sinus rhythm.  On exam, irregular rhythm, no murmurs, lungs clear.  Risks and benefits of the procedure explained.  Risks include skin irritation, VT and VF.  The patient understands these risks and has agreed to the procedure.  Kourtland Coopman have anesthesia assist with sedation.  Edmar Blankenburg Elberta Fortis, MD 11/23/2015 10:48 AM

## 2015-11-23 NOTE — Transfer of Care (Signed)
Immediate Anesthesia Transfer of Care Note  Patient: Ronald Hull  Procedure(s) Performed: Procedure(s): CARDIOVERSION (N/A)  Patient Location: PACU  Anesthesia Type:MAC  Level of Consciousness: awake and alert   Airway & Oxygen Therapy: Patient Spontanous Breathing  Post-op Assessment: Report given to RN and Post -op Vital signs reviewed and stable  Post vital signs: Reviewed and stable  Last Vitals:  Vitals:   11/23/15 1130 11/23/15 1131  BP:    Pulse: 61 (!) 59  Resp: (!) 21 20    Last Pain:  Vitals:   11/23/15 1045  TempSrc: Oral         Complications: No apparent anesthesia complications

## 2015-11-23 NOTE — Anesthesia Postprocedure Evaluation (Signed)
Anesthesia Post Note  Patient: Ronald Hull  Procedure(s) Performed: Procedure(s) (LRB): CARDIOVERSION (N/A)  Patient location during evaluation: Endoscopy Anesthesia Type: MAC Level of consciousness: awake and sedated Pain management: pain level controlled Vital Signs Assessment: post-procedure vital signs reviewed and stable Respiratory status: spontaneous breathing Cardiovascular status: stable Postop Assessment: no signs of nausea or vomiting Anesthetic complications: no     Last Vitals:  Vitals:   11/23/15 1132 11/23/15 1140  BP: (!) 102/58 (!) 103/55  Pulse: (!) 57   Resp: (!) 22   Temp: 36.7 C     Last Pain:  Vitals:   11/23/15 1132  TempSrc: Oral   Pain Goal:                 [name] JR,[name]

## 2015-11-23 NOTE — Discharge Instructions (Signed)
Electrical Cardioversion, Care After Refer to this sheet in the next few weeks. These instructions provide you with information on caring for yourself after your procedure. Your health care provider may also give you more specific instructions. Your treatment has been planned according to current medical practices, but problems sometimes occur. Call your health care provider if you have any problems or questions after your procedure. WHAT TO EXPECT AFTER THE PROCEDURE After your procedure, it is typical to have the following sensations:  Some redness on the skin where the shocks were delivered. If this is tender, a sunburn lotion or hydrocortisone cream may help.  Possible return of an abnormal heart rhythm within hours or days after the procedure. HOME CARE INSTRUCTIONS  Take medicines only as directed by your health care provider. Be sure you understand how and when to take your medicine.  Learn how to feel your pulse and check it often.  Limit your activity for 48 hours after the procedure or as directed by your health care provider.  Avoid or minimize caffeine and other stimulants as directed by your health care provider. SEEK MEDICAL CARE IF:  You feel like your heart is beating too fast or your pulse is not regular.  You have any questions about your medicines.  You have bleeding that will not stop. SEEK IMMEDIATE MEDICAL CARE IF:  You are dizzy or feel faint.  It is hard to breathe or you feel short of breath.  There is a change in discomfort in your chest.  Your speech is slurred or you have trouble moving an arm or leg on one side of your body.  You get a serious muscle cramp that does not go away.  Your fingers or toes turn cold or blue.   This information is not intended to replace advice given to you by your health care provider. Make sure you discuss any questions you have with your health care provider.   Document Released: 02/02/2013 Document Revised: 05/05/2014  Document Reviewed: 02/02/2013 Elsevier Interactive Patient Education 2016 [employer]    Atrial Fibrillation Atrial fibrillation is a type of heartbeat that is irregular or fast (rapid). If you have this condition, your heart keeps quivering in a weird (chaotic) way. This condition can make it so your heart cannot pump blood normally. Having this condition gives a person more risk for stroke, heart failure, and other heart problems. There are different types of atrial fibrillation. Talk with your doctor to learn about the type that you have. HOME CARE  Take over-the-counter and prescription medicines only as told by your doctor.  If your doctor prescribed a blood-thinning medicine, take it exactly as told. Taking too much of it can cause bleeding. If you do not take enough of it, you will not have the protection that you need against stroke and other problems.  Do not use any tobacco products. These include cigarettes, chewing tobacco, and e-cigarettes. If you need help quitting, ask your doctor.  If you have apnea (obstructive sleep apnea), manage it as told by your doctor.  Do not drink alcohol.  Do not drink beverages that have caffeine. These include coffee, soda, and tea.  Maintain a healthy weight. Do not use diet pills unless your doctor says they are safe for you. Diet pills may make heart problems worse.  Follow diet instructions as told by your doctor.  Exercise regularly as told by your doctor.  Keep all follow-up visits as told by your doctor. This is important. GET  HELP IF:  You notice a change in the speed, rhythm, or strength of your heartbeat.  You are taking a blood-thinning medicine and you notice more bruising.  You get tired more easily when you move or exercise. GET HELP RIGHT AWAY IF:  You have pain in your chest or your belly (abdomen).  You have sweating or weakness.  You feel sick to your stomach (nauseous).  You notice blood in your throw up  (vomit), poop (stool), or pee (urine).  You are short of breath.  You suddenly have swollen feet and ankles.  You feel dizzy.  Your suddenly get weak or numb in your face, arms, or legs, especially if it happens on one side of your body.  You have trouble talking, trouble understanding, or both.  Your face or your eyelid droops on one side. These symptoms may be an emergency. Do not wait to see if the symptoms will go away. Get medical help right away. Call your local emergency services (911 in the [place] Do not drive yourself to the hospital.   This information is not intended to replace advice given to you by your health care provider. Make sure you discuss any questions you have with your health care provider.   Document Released: 01/22/2008 Document Revised: 01/03/2015 Document Reviewed: 08/09/2014 Elsevier Interactive Patient Education [employer]

## 2015-11-23 NOTE — Procedures (Signed)
SURGEON:  Makena Murdock Elberta Fortis, MD  PREPROCEDURE DIAGNOSES: 1. Persistent atrial fibrillation.  POSTPROCEDURE DIAGNOSES: 1. Persistent atrial fibrillation.  PROCEDURES: 1. Cardioversion  INTRODUCTION:  Ronald Hull is a 70 y.o. male with a history of paroxysmal atrial fibrillation who now presents for EP study and radiofrequency ablation.  The patient reports initially being diagnosed with atrial fibrillation after presenting with symptomatic palpitations and fatgiue. The patient reports increasing frequency and duration of atrial fibrillation since that time.  The patient has failed medical therapy.  The patient therefore presents today for cardioversion.   Cardioversion: Anesthesia was present for sedation.  Please check the anesthesia report for further details. The patient was then cardioverted to sinus rhythm with a single synchronized 360-J biphasic shock with cardioversion electrodes in the anterior-posterior thoracic configuration. He maintained sinus rhythm initially but then had recurence of afib with catheter manipulation within the left atrial, requiring repeat cardioversion with 360J biphasic.  He remained in sinus rhythm thereafter.   CONCLUSIONS: 1. Atrial fibrillation upon presentation.   2. Successful cardioversion to sinus rhythm 3. No early apparent complications.   Ronald Arnott [name],MD 12:17 PM 11/23/2015

## 2015-11-24 ENCOUNTER — Encounter (HOSPITAL_COMMUNITY): Payer: Self-pay | Admitting: Cardiology

## 2015-11-25 ENCOUNTER — Encounter: Payer: Self-pay | Admitting: Internal Medicine

## 2016-01-18 ENCOUNTER — Encounter: Payer: Self-pay | Admitting: Cardiology

## 2016-02-05 ENCOUNTER — Ambulatory Visit (INDEPENDENT_AMBULATORY_CARE_PROVIDER_SITE_OTHER): Payer: Medicare Other | Admitting: Cardiology

## 2016-02-05 ENCOUNTER — Encounter: Payer: Self-pay | Admitting: Cardiology

## 2016-02-05 VITALS — BP 140/94 | HR 53 | Ht 71.0 in | Wt 288.6 lb

## 2016-02-05 DIAGNOSIS — I481 Persistent atrial fibrillation: Secondary | ICD-10-CM | POA: Diagnosis not present

## 2016-02-05 DIAGNOSIS — I4819 Other persistent atrial fibrillation: Secondary | ICD-10-CM

## 2016-02-05 NOTE — Patient Instructions (Signed)
Medication Instructions:  Your physician recommends that you continue on your current medications as directed. Please refer to the Current Medication list given to you today.  If you need a refill on your cardiac medications before your next appointment, please call your pharmacy.   Labwork: None ordered  Testing/Procedures: None ordered  Follow-Up: Your physician wants you to follow-up in: 6 months with Dr. Camnitz.  You will receive a reminder letter in the mail two months in advance. If you don't receive a letter, please call our office to schedule the follow-up appointment.  Thank you for choosing [facility]!!   Symia Herdt, RN [phone]         

## 2016-02-05 NOTE — Progress Notes (Signed)
Electrophysiology Office Note   Date:  02/05/2016   ID:  Ronald Hull, DOB 12-24-1945, MRN [id number]  PCP:  Katy ApoPOLITE,RONALD D, MD   Primary Electrophysiologist:  Regan LemmingWill Martin Kalee Broxton, MD    Chief Complaint  Patient presents with  . Follow-up    Persistent atrial fibrillation      History of Present Illness: Ronald Hull is a 70 y.o. male who presents today for electrophysiology evaluation.   He presented to his primary physician on 10/02/15 with an elevated and her regular heart rate. Had attempted cardioversion which was unsuccessful.  Repeat attempt at 360 J had successful cardioversion. Since his cardioversion, he has done well. He was no longer has feelings of palpitations, fatigue, or near syncope.   Today, he denies symptoms of chest pain, shortness of breath, orthopnea, PND, lower extremity edema, claudication, dizziness, presyncope, syncope, bleeding, or neurologic sequela. The patient is tolerating medications without difficulties and is otherwise without complaint today.    Past Medical History:  Diagnosis Date  . Arthritis   . Complication of anesthesia    BP dropped post colonoscopy  . Hypercholesteremia   . Hypertension   . Sleep apnea    does use a cpap  . Wears glasses    Past Surgical History:  Procedure Laterality Date  . CARDIOVERSION N/A 11/01/2015   Procedure: CARDIOVERSION;  Surgeon: Chrystie NoseKenneth C Hilty, MD;  Location[facility];  Service: Cardiovascular;  Laterality: N/A;  . CARDIOVERSION N/A 11/23/2015   Procedure: CARDIOVERSION;  Surgeon: Zitlaly Malson Jorja LoaMartin Jody Silas, MD;  [facility];  Service: Cardiovascular;  Laterality: N/A;  . COLONOSCOPY    . KNEE ARTHROSCOPY  2000  . SHOULDER ARTHROSCOPY WITH ROTATOR CUFF REPAIR AND SUBACROMIAL DECOMPRESSION Right 07/15/2012   Procedure: RIGHT ARTHROSCOPY SHOULDER DECOMPRESSION  SUBACROMIAL PARTIAL ACROMIOPLASTY WITH CORACOACROMIAL RELEASE,  DISTAL CLAVICULECTOMY, resect biceps debride labrium WITH  ROTATOR CUFF REPAIR;  Surgeon: Loreta Aveaniel F Murphy, MD;  Location: [facility];  Service: Orthopedics;  Laterality: Right;     Current Outpatient Prescriptions  Medication Sig Dispense Refill  . amiodarone (PACERONE) 200 MG tablet Take 100 mg by mouth 2 (two) times daily.    . metoprolol tartrate (LOPRESSOR) 25 MG tablet Take 25 mg by mouth 2 (two) times daily.    Marland Kitchen. apixaban (ELIQUIS) 5 MG TABS tablet Take 1 tablet (5 mg total) by mouth 2 (two) times daily. 60 tablet 3  . atorvastatin (LIPITOR) 20 MG tablet Take 20 mg by mouth every evening.    Marland Kitchen. OVER THE COUNTER MEDICATION Take 1 tablet by mouth daily. For the prostate, herbal supplement    . zaleplon (SONATA) 5 MG capsule Take 1 capsule by mouth at bedtime as needed for sleep.   1   Current Facility-Administered Medications  Medication Dose Route Frequency Provider Last Rate Last Dose  . cefTRIAXone (ROCEPHIN) injection 1 g  1 g Intramuscular Q24H Jonita Albeehris W Guest, MD   1 g at 11/18/12 1032    Allergies:   Review of patient's allergies indicates no known allergies.   Social History:  The patient  reports that he quit smoking about 42 years ago. He has never used smokeless tobacco. He reports that he drinks alcohol. He reports that he does not use drugs.   Family History:  The patient's family history includes Cancer in his sister; Clotting disorder in his mother; Heart disease in his brother; Heart failure in his father; Hypertension in his sister; Other in his mother.    ROS:  Please see the history of present illness.   Otherwise, review of systems is positive for SOB lying flat (mild).   All other systems are reviewed and negative.    PHYSICAL EXAM: VS:  BP (!) 140/94   Pulse (!) 53   Ht 5\' 11"  (1.803 m)   Wt 288 lb 9.6 oz (130.9 kg)   BMI 40.25 kg/m  , BMI Body mass index is 40.25 kg/m. GEN: Well nourished, well developed, in no acute distress  HEENT: normal  Neck: no JVD, carotid bruits, or masses Cardiac: RRR; no  murmurs, rubs, or gallops,no edema  Respiratory:  clear to auscultation bilaterally, normal work of breathing GI: soft, nontender, nondistended, + BS MS: no deformity or atrophy  Skin: warm and dry Neuro:  Strength and sensation are intact Psych: euthymic mood, full affect  EKG:  EKG is ordered today. The ekg ordered today shows sinus rhythm rate 53  Recent Labs: 11/23/2015: BUN 13; Creatinine, Ser 1.00; Hemoglobin 16.3; Potassium 4.4; Sodium 139    Lipid Panel  No results found for: CHOL, TRIG, HDL, CHOLHDL, VLDL, LDLCALC, LDLDIRECT   Wt Readings from Last 3 Encounters:  02/05/16 288 lb 9.6 oz (130.9 kg)  11/23/15 282 lb (127.9 kg)  11/06/15 282 lb 12.8 oz (128.3 kg)      Other studies Reviewed: Additional studies/ records that were reviewed today include: PCP notes  ASSESSMENT AND PLAN:  1.  Persistent atrial fibrillation: Currently on verapamil and apixaban for anticoagulation. Had attempted cardioversion which was unsuccessful with a second attempt at 360J which was successful. He is doing well on the amiodarone and is tolerating the Eliquis without problems. We'll make no changes day. We'll plan to switch him from amiodarone to [facility] in January.   This patients CHA2DS2-VASc Score and unadjusted Ischemic Stroke Rate (% per year) is equal to 2.2 % stroke rate/year from a score of 2  Above score calculated as 1 point each if present [CHF, HTN, DM, Vascular=MI/PAD/Aortic Plaque, Age if 65-74, or Male] Above score calculated as 2 points each if present [Age > 75, or Stroke/TIA/TE]  2. Hypertension:Well controlled  3. OSA: on CPAP   Current medicines are reviewed at length with the patient today.   The patient does not have concerns regarding his medicines.  The following changes were made today:  none  Labs/ tests ordered today include: ECG  No orders of the defined types were placed in this encounter.    Disposition:   FU with Caitlynne Harbeck 6  months  Signed, Desean Heemstra [name], MD  02/05/2016 11:39 AM     [facility] [address] [place] [address] [phone] (office) [phone] (fax)

## 2016-02-06 ENCOUNTER — Other Ambulatory Visit: Payer: Self-pay | Admitting: Cardiology

## 2016-02-19 DIAGNOSIS — G4733 Obstructive sleep apnea (adult) (pediatric): Secondary | ICD-10-CM | POA: Diagnosis not present

## 2016-02-20 DIAGNOSIS — X32XXXD Exposure to sunlight, subsequent encounter: Secondary | ICD-10-CM | POA: Diagnosis not present

## 2016-02-20 DIAGNOSIS — D225 Melanocytic nevi of trunk: Secondary | ICD-10-CM | POA: Diagnosis not present

## 2016-02-20 DIAGNOSIS — L570 Actinic keratosis: Secondary | ICD-10-CM | POA: Diagnosis not present

## 2016-02-20 DIAGNOSIS — Z1283 Encounter for screening for malignant neoplasm of skin: Secondary | ICD-10-CM | POA: Diagnosis not present

## 2016-02-29 DIAGNOSIS — H43813 Vitreous degeneration, bilateral: Secondary | ICD-10-CM | POA: Diagnosis not present

## 2016-05-09 ENCOUNTER — Encounter: Payer: Self-pay | Admitting: Cardiology

## 2016-05-13 MED ORDER — DRONEDARONE HCL 400 MG PO TABS: 400.0000 mg | ORAL_TABLET | Freq: Two times a day (BID) | ORAL | 6 refills | Status: DC

## 2016-05-13 NOTE — Telephone Encounter (Signed)
Stopping Amiodarone.  Switching to Multaq 400 mg BID

## 2016-05-22 DIAGNOSIS — R748 Abnormal levels of other serum enzymes: Secondary | ICD-10-CM | POA: Diagnosis not present

## 2016-05-22 DIAGNOSIS — Z125 Encounter for screening for malignant neoplasm of prostate: Secondary | ICD-10-CM | POA: Diagnosis not present

## 2016-05-22 DIAGNOSIS — Z6841 Body Mass Index (BMI) 40.0 and over, adult: Secondary | ICD-10-CM | POA: Diagnosis not present

## 2016-05-22 DIAGNOSIS — G4733 Obstructive sleep apnea (adult) (pediatric): Secondary | ICD-10-CM | POA: Diagnosis not present

## 2016-05-22 DIAGNOSIS — I4891 Unspecified atrial fibrillation: Secondary | ICD-10-CM | POA: Diagnosis not present

## 2016-05-22 DIAGNOSIS — E663 Overweight: Secondary | ICD-10-CM | POA: Diagnosis not present

## 2016-05-22 DIAGNOSIS — Z0000 Encounter for general adult medical examination without abnormal findings: Secondary | ICD-10-CM | POA: Diagnosis not present

## 2016-05-22 DIAGNOSIS — E7800 Pure hypercholesterolemia, unspecified: Secondary | ICD-10-CM | POA: Diagnosis not present

## 2016-05-22 DIAGNOSIS — I10 Essential (primary) hypertension: Secondary | ICD-10-CM | POA: Diagnosis not present

## 2016-05-27 ENCOUNTER — Encounter: Payer: Self-pay | Admitting: *Deleted

## 2016-05-27 ENCOUNTER — Ambulatory Visit (INDEPENDENT_AMBULATORY_CARE_PROVIDER_SITE_OTHER): Payer: Medicare Other | Admitting: *Deleted

## 2016-05-27 VITALS — HR 55 | Ht 71.0 in | Wt 298.0 lb

## 2016-05-27 DIAGNOSIS — Z79899 Other long term (current) drug therapy: Secondary | ICD-10-CM

## 2016-05-27 DIAGNOSIS — Z23 Encounter for immunization: Secondary | ICD-10-CM | POA: Diagnosis not present

## 2016-05-27 DIAGNOSIS — I481 Persistent atrial fibrillation: Secondary | ICD-10-CM

## 2016-05-27 DIAGNOSIS — I4819 Other persistent atrial fibrillation: Secondary | ICD-10-CM

## 2016-05-27 NOTE — Progress Notes (Signed)
1.) Reason for visit: ECG for med change from amioderone to Multaq  2.) Name of MD requesting visit: Dr Elberta Fortis  3.) H&P: Wt 298lb,  Hr 55, Ht 5\' 11"   4.) ROS related to problem: Atrial Fibrillation  5.) Assessment and plan per MD: Reviewed ecg with Dr Elberta Fortis, he approved.  Dismissed patient

## 2016-05-28 NOTE — Addendum Note (Signed)
Addended by: Baird Lyons on: 05/28/2016 06:02 PM   Modules accepted: Orders

## 2016-05-29 ENCOUNTER — Other Ambulatory Visit: Payer: Self-pay | Admitting: Cardiology

## 2016-05-29 NOTE — Telephone Encounter (Signed)
Rx refill sent to pharmacy. 

## 2016-06-24 DIAGNOSIS — R748 Abnormal levels of other serum enzymes: Secondary | ICD-10-CM | POA: Diagnosis not present

## 2016-07-23 ENCOUNTER — Encounter: Payer: Self-pay | Admitting: Cardiology

## 2016-07-31 ENCOUNTER — Other Ambulatory Visit: Payer: Self-pay | Admitting: Cardiology

## 2016-08-01 NOTE — Telephone Encounter (Signed)
Please advise on refill request as last office visit has 25 mg bid listed. Looks like it was reduced to 25 mg bid at 11/06/15 office visit and then just refilled incorrectly. Thanks, MI

## 2016-08-01 NOTE — Telephone Encounter (Signed)
Pt should be taking 25mg  BID.  (the 50 mg is incorrect)  Thanks

## 2016-08-03 ENCOUNTER — Encounter: Payer: Self-pay | Admitting: Cardiology

## 2016-08-03 ENCOUNTER — Other Ambulatory Visit: Payer: Self-pay | Admitting: Cardiology

## 2016-08-04 ENCOUNTER — Other Ambulatory Visit: Payer: Self-pay | Admitting: *Deleted

## 2016-08-04 ENCOUNTER — Encounter: Payer: Self-pay | Admitting: Cardiology

## 2016-08-04 MED ORDER — METOPROLOL TARTRATE 25 MG PO TABS: 25.0000 mg | ORAL_TABLET | Freq: Two times a day (BID) | ORAL | 1 refills | Status: DC

## 2016-08-04 MED ORDER — METOPROLOL TARTRATE 25 MG PO TABS
25.0000 mg | ORAL_TABLET | Freq: Two times a day (BID) | ORAL | 1 refills | Status: DC
Start: 2016-08-04 — End: 2016-08-04

## 2016-08-18 ENCOUNTER — Ambulatory Visit (INDEPENDENT_AMBULATORY_CARE_PROVIDER_SITE_OTHER): Payer: Medicare Other | Admitting: Cardiology

## 2016-08-18 ENCOUNTER — Encounter: Payer: Self-pay | Admitting: Cardiology

## 2016-08-18 VITALS — BP 164/96 | HR 51 | Ht 71.0 in | Wt 302.8 lb

## 2016-08-18 DIAGNOSIS — I481 Persistent atrial fibrillation: Secondary | ICD-10-CM | POA: Diagnosis not present

## 2016-08-18 DIAGNOSIS — I4819 Other persistent atrial fibrillation: Secondary | ICD-10-CM

## 2016-08-18 MED ORDER — LISINOPRIL 10 MG PO TABS: 10.0000 mg | ORAL_TABLET | Freq: Every day | ORAL | 3 refills | Status: DC

## 2016-08-18 NOTE — Progress Notes (Signed)
Electrophysiology Office Note   Date:  08/18/2016   ID:  Ronald Hull, DOB Apr 21, 1946, MRN [id number]  PCP:  Katy Apo, MD   Primary Electrophysiologist:  Regan Lemming, MD    Chief Complaint  Patient presents with  . Follow-up    Persistent Afib     History of Present Illness: Ronald Hull is a 71 y.o. male who presents today for electrophysiology evaluation.   He presented to his primary physician on 10/02/15 with an elevated and irregular heart rate. Cardioversion was attempted and was unsuccessful with repeat cardioversion successfully to 360 J. He was initially placed on amiodarone and has since been switched to Multaq. He is feeling well without any major complaints.   Today, he denies symptoms of chest pain, shortness of breath, orthopnea, PND, lower extremity edema, claudication, dizziness, presyncope, syncope, bleeding, or neurologic sequela. The patient is tolerating medications without difficulties and is otherwise without complaint today.    Past Medical History:  Diagnosis Date  . Arthritis   . Complication of anesthesia    BP dropped post colonoscopy  . Hypercholesteremia   . Hypertension   . Sleep apnea    does use a cpap  . Wears glasses    Past Surgical History:  Procedure Laterality Date  . CARDIOVERSION N/A 11/01/2015   Procedure: CARDIOVERSION;  Surgeon: [name], MD;  Location: [facility] ENDOSCOPY;  Service: Cardiovascular;  Laterality: N/A;  . CARDIOVERSION N/A 11/23/2015   Procedure: CARDIOVERSION;  Surgeon: Will [name], MD;  Location: [facility] ENDOSCOPY;  Service: Cardiovascular;  Laterality: N/A;  . COLONOSCOPY    . KNEE ARTHROSCOPY  2000  . SHOULDER ARTHROSCOPY WITH ROTATOR CUFF REPAIR AND SUBACROMIAL DECOMPRESSION Right 07/15/2012   Procedure: RIGHT ARTHROSCOPY SHOULDER DECOMPRESSION  SUBACROMIAL PARTIAL ACROMIOPLASTY WITH CORACOACROMIAL RELEASE,  DISTAL CLAVICULECTOMY, resect biceps debride labrium WITH ROTATOR CUFF REPAIR;   Surgeon: [name], MD;  Location: [facility];  Service: Orthopedics;  Laterality: Right;     Current Outpatient Prescriptions  Medication Sig Dispense Refill  . atorvastatin (LIPITOR) 20 MG tablet Take 20 mg by mouth every evening.    . dronedarone (MULTAQ) 400 MG tablet Take 1 tablet (400 mg total) by mouth 2 (two) times daily with a meal. 60 tablet 6  . ELIQUIS 5 MG TABS tablet TAKE 1 TABLET(5 MG) BY MOUTH TWICE DAILY 60 tablet 11  . metoprolol tartrate (LOPRESSOR) 25 MG tablet Take 1 tablet (25 mg total) by mouth 2 (two) times daily. 180 tablet 1  . OVER THE COUNTER MEDICATION Take 1 tablet by mouth daily. For the prostate, herbal supplement    . zaleplon (SONATA) 5 MG capsule Take 1 capsule by mouth at bedtime as needed for sleep.   1  . lisinopril (PRINIVIL,ZESTRIL) 10 MG tablet Take 1 tablet (10 mg total) by mouth daily. 90 tablet 3   Current Facility-Administered Medications  Medication Dose Route Frequency Provider Last Rate Last Dose  . cefTRIAXone (ROCEPHIN) injection 1 g  1 g Intramuscular Q24H Jonita Albee, MD   1 g at 11/18/12 1032    Allergies:   Patient has no known allergies.   Social History:  The patient  reports that he quit smoking about 43 years ago. He has never used smokeless tobacco. He reports that he drinks alcohol. He reports that he does not use drugs.   Family History:  The patient's family history includes Cancer in his sister; Clotting disorder in his mother; Heart disease in  his brother; Heart failure in his father; Hypertension in his sister; Other in his mother.    ROS:  Please see the history of present illness.   Otherwise, review of systems is positive for Short of breath, constipation, difficulty urinating.   All other systems are reviewed and negative.    PHYSICAL EXAM: VS:  BP (!) 164/96   Pulse (!) 51   Ht 5\' 11"  (1.803 m)   Wt (!) 302 lb 12.8 oz (137.3 kg)   BMI 42.23 kg/m  , BMI Body mass index is 42.23 kg/m. GEN:  Well nourished, well developed, in no acute distress  HEENT: normal  Neck: no JVD, carotid bruits, or masses Cardiac: RRR; no murmurs, rubs, or gallops,no edema  Respiratory:  clear to auscultation bilaterally, normal work of breathing GI: soft, nontender, nondistended, + BS MS: no deformity or atrophy  Skin: warm and dry Neuro:  Strength and sensation are intact Psych: euthymic mood, full affect   EKG:  EKG is ordered today. Personal review of the EKG ordered  shows sinus rhythm, rate 51  Recent Labs: 11/23/2015: BUN 13; Creatinine, Ser 1.00; Hemoglobin 16.3; Potassium 4.4; Sodium 139    Lipid Panel  No results found for: CHOL, TRIG, HDL, CHOLHDL, VLDL, LDLCALC, LDLDIRECT   Wt Readings from Last 3 Encounters:  08/18/16 (!) 302 lb 12.8 oz (137.3 kg)  05/27/16 298 lb (135.2 kg)  02/05/16 288 lb 9.6 oz (130.9 kg)      Other studies Reviewed: Additional studies/ records that were reviewed today include: TTE 09/2015 - Left ventricle: The cavity size was normal. Wall thickness was   increased in a pattern of moderate LVH. Systolic function was   normal. The estimated ejection fraction was in the range of 55%   to 60%. Indeterminant diastolic function. Wall motion was normal;   there were no regional wall motion abnormalities. - Aortic valve: There was no stenosis. There was trivial   regurgitation. - Mitral valve: Mildly calcified annulus. There was mild   regurgitation. - Left atrium: The atrium was mildly dilated. - Right ventricle: The cavity size was normal. Systolic function   was normal. - Right atrium: The atrium was mildly dilated. - Tricuspid valve: Peak RV-RA gradient (S): 26 mm Hg. - Pulmonary arteries: PA peak pressure: 29 mm Hg (S). - Inferior vena cava: The vessel was normal in size. The   respirophasic diameter changes were in the normal range (= 50%),   consistent with normal central venous pressure.  ASSESSMENT AND PLAN:  1.  Persistent atrial  fibrillation: On Eliquis for anticoagulation and has recently started on multi-tach after stopping amiodarone. He remains in sinus rhythm today. No changes necessary.  This patients CHA2DS2-VASc Score and unadjusted Ischemic Stroke Rate (% per year) is equal to 2.2 % stroke rate/year from a score of 2  Above score calculated as 1 point each if present [CHF, HTN, DM, Vascular=MI/PAD/Aortic Plaque, Age if 65-74, or Male] Above score calculated as 2 points each if present [Age > 75, or Stroke/TIA/TE]  2. Hypertension: Blood pressure is elevated today in clinic, and according to the patient has been elevated on his home checks. We'll therefore start him on lisinopril 10 mg.  3. OSA: on CPAP   Current medicines are reviewed at length with the patient today.   The patient does not have concerns regarding his medicines.  The following changes were made today:  lisinopril  Labs/ tests ordered today include:  Orders Placed This Encounter  Procedures  .  EKG 12-Lead     Disposition:   FU with Will Camnitz 6 months  Signed, Will [name], MD  08/18/2016 2:47 PM     [facility] [address] [place] [address] [phone] (office) [phone] (fax)

## 2016-08-18 NOTE — Patient Instructions (Signed)
Medication Instructions:  Your physician has recommended you make the following change in your medication:  START Lisinopril 10 mg once daily  Labwork:  None ordered  Testing/Procedures:  None ordered  Follow-Up:  Your physician wants you to follow-up in: 6 months with Dr. Elberta Fortis.  You will receive a reminder letter in the mail two months in advance. If you don't receive a letter, please call our office to schedule the follow-up appointment.  - If you need a refill on your cardiac medications before your next appointment, please call your pharmacy.   Thank you for choosing [facility]!!   Dory Horn, RN [phone]   Any Other Special Instructions Will Be Listed Below (If Applicable).  Please keep track of your blood pressures over the next several weeks.  Please call Sherri, RN with your readings.   Lisinopril tablets What is this medicine? LISINOPRIL (lyse IN oh pril) is an ACE inhibitor. This medicine is used to treat high blood pressure and heart failure. It is also used to protect the heart immediately after a heart attack. This medicine may be used for other purposes; ask your health care provider or pharmacist if you have questions. COMMON BRAND NAME(S): Prinivil, Zestril What should I tell my health care provider before I take this medicine? They need to know if you have any of these conditions: -diabetes -heart or blood vessel disease -kidney disease -low blood pressure -previous swelling of the tongue, face, or lips with difficulty breathing, difficulty swallowing, hoarseness, or tightening of the throat -an unusual or allergic reaction to lisinopril, other ACE inhibitors, insect venom, foods, dyes, or preservatives -pregnant or trying to get pregnant -breast-feeding How should I use this medicine? Take this medicine by mouth with a glass of water. Follow the directions on your prescription label. You may take this medicine with or without food. If it  upsets your stomach, take it with food. Take your medicine at regular intervals. Do not take it more often than directed. Do not stop taking except on your doctor's advice. Talk to your pediatrician regarding the use of this medicine in children. Special care may be needed. While this drug may be prescribed for children as young as 28 years of age for selected conditions, precautions do apply. Overdosage: If you think you have taken too much of this medicine contact a poison control center or emergency room at once. NOTE: This medicine is only for you. Do not share this medicine with others. What if I miss a dose? If you miss a dose, take it as soon as you can. If it is almost time for your next dose, take only that dose. Do not take double or extra doses. What may interact with this medicine? Do not take this medicine with any of the following medications: -hymenoptera venom -sacubitril; valsartan This medicines may also interact with the following medications: -aliskiren -angiotensin receptor blockers, like losartan or valsartan -certain medicines for diabetes -diuretics -everolimus -gold compounds -lithium -NSAIDs, medicines for pain and inflammation, like ibuprofen or naproxen -potassium salts or supplements -salt substitutes -sirolimus -temsirolimus This list may not describe all possible interactions. Give your health care provider a list of all the medicines, herbs, non-prescription drugs, or dietary supplements you use. Also tell them if you smoke, drink alcohol, or use illegal drugs. Some items may interact with your medicine. What should I watch for while using this medicine? Visit your doctor or health care professional for regular check ups. Check your blood pressure as directed.  Ask your doctor what your blood pressure should be, and when you should contact him or her. Do not treat yourself for coughs, colds, or pain while you are using this medicine without asking your doctor  or health care professional for advice. Some ingredients may increase your blood pressure. Women should inform their doctor if they wish to become pregnant or think they might be pregnant. There is a potential for serious side effects to an unborn child. Talk to your health care professional or pharmacist for more information. Check with your doctor or health care professional if you get an attack of severe diarrhea, nausea and vomiting, or if you sweat a lot. The loss of too much body fluid can make it dangerous for you to take this medicine. You may get drowsy or dizzy. Do not drive, use machinery, or do anything that needs mental alertness until you know how this drug affects you. Do not stand or sit up quickly, especially if you are an older patient. This reduces the risk of dizzy or fainting spells. Alcohol can make you more drowsy and dizzy. Avoid alcoholic drinks. Avoid salt substitutes unless you are told otherwise by your doctor or health care professional. What side effects may I notice from receiving this medicine? Side effects that you should report to your doctor or health care professional as soon as possible: -allergic reactions like skin rash, itching or hives, swelling of the hands, feet, face, lips, throat, or tongue -breathing problems -signs and symptoms of kidney injury like trouble passing urine or change in the amount of urine -signs and symptoms of increased potassium like muscle weakness; chest pain; or fast, irregular heartbeat -signs and symptoms of liver injury like dark yellow or brown urine; general ill feeling or flu-like symptoms; light-colored stools; loss of appetite; nausea; right upper belly pain; unusually weak or tired; yellowing of the eyes or skin -signs and symptoms of low blood pressure like dizziness; feeling faint or lightheaded, falls; unusually weak or tired -stomach pain with or without nausea and vomiting Side effects that usually do not require medical  attention (report to your doctor or health care professional if they continue or are bothersome): -changes in taste -cough -dizziness -fever -headache -sensitivity to light This list may not describe all possible side effects. Call your doctor for medical advice about side effects. You may report side effects to FDA at 1-800-FDA-1088. Where should I keep my medicine? Keep out of the reach of children. Store at room temperature between 15 and 30 degrees C (59 and 86 degrees F). Protect from moisture. Keep container tightly closed. Throw away any unused medicine after the expiration date. NOTE: This sheet is a summary. It may not cover all possible information. If you have questions about this medicine, talk to your doctor, pharmacist, or health care provider.  2018 Elsevier/Gold Standard (2015-06-04 12:52:35)

## 2016-11-20 DIAGNOSIS — I10 Essential (primary) hypertension: Secondary | ICD-10-CM | POA: Diagnosis not present

## 2016-11-20 DIAGNOSIS — G4733 Obstructive sleep apnea (adult) (pediatric): Secondary | ICD-10-CM | POA: Diagnosis not present

## 2016-11-20 DIAGNOSIS — I4891 Unspecified atrial fibrillation: Secondary | ICD-10-CM | POA: Diagnosis not present

## 2016-11-20 DIAGNOSIS — E7800 Pure hypercholesterolemia, unspecified: Secondary | ICD-10-CM | POA: Diagnosis not present

## 2016-12-05 ENCOUNTER — Other Ambulatory Visit: Payer: Self-pay | Admitting: Cardiology

## 2017-02-09 ENCOUNTER — Other Ambulatory Visit: Payer: Self-pay | Admitting: Cardiology

## 2017-02-16 ENCOUNTER — Encounter: Payer: Self-pay | Admitting: Cardiology

## 2017-02-16 ENCOUNTER — Ambulatory Visit (INDEPENDENT_AMBULATORY_CARE_PROVIDER_SITE_OTHER): Payer: Medicare Other | Admitting: Cardiology

## 2017-02-16 VITALS — BP 138/86 | HR 55 | Ht 71.0 in | Wt 297.0 lb

## 2017-02-16 DIAGNOSIS — G4733 Obstructive sleep apnea (adult) (pediatric): Secondary | ICD-10-CM | POA: Diagnosis not present

## 2017-02-16 DIAGNOSIS — I10 Essential (primary) hypertension: Secondary | ICD-10-CM | POA: Diagnosis not present

## 2017-02-16 DIAGNOSIS — I481 Persistent atrial fibrillation: Secondary | ICD-10-CM

## 2017-02-16 DIAGNOSIS — I4819 Other persistent atrial fibrillation: Secondary | ICD-10-CM

## 2017-02-16 NOTE — Patient Instructions (Signed)
Medication Instructions:    Your physician recommends that you continue on your current medications as directed. Please refer to the Current Medication list given to you today.  Labwork:  None ordered  Testing/Procedures:  None ordered  Follow-Up:  Your physician wants you to follow-up in: 6 months with Dr. Camnitz.  You will receive a reminder letter in the mail two months in advance. If you don't receive a letter, please call our office to schedule the follow-up appointment.  - If you need a refill on your cardiac medications before your next appointment, please call your pharmacy.    Thank you for choosing [facility]!!   Argus Caraher, RN [phone]         

## 2017-02-16 NOTE — Progress Notes (Signed)
Electrophysiology Office Note   Date:  02/16/2017   ID:  Ronald Hull, DOB Sep 08, 1945, MRN [id number]  PCP:  Renford Dills, MD   Primary Electrophysiologist:  Dilpreet Faires [name], MD    Chief Complaint  Patient presents with  . Follow-up    Persistent Afib     History of Present Illness: Ronald Hull is a 71 y.o. male who presents today for electrophysiology evaluation.   He presented to his primary physician on 10/02/15 with an elevated and irregular heart rate. Cardioversion was attempted and was unsuccessful with repeat cardioversion successfully to 360 J. He was initially placed on amiodarone and has since been switched to Multaq. He is doing well without major complaints. He was having a chronic dry hacking cough, but stopped his lisinopril and the cough went away. He has noted no further episodes of atrial fibrillation.   Today, denies symptoms of palpitations, chest pain, shortness of breath, orthopnea, PND, lower extremity edema, claudication, dizziness, presyncope, syncope, bleeding, or neurologic sequela. The patient is tolerating medications without difficulties.     Past Medical History:  Diagnosis Date  . Arthritis   . Complication of anesthesia    BP dropped post colonoscopy  . Hypercholesteremia   . Hypertension   . Sleep apnea    does use a cpap  . Wears glasses    Past Surgical History:  Procedure Laterality Date  . CARDIOVERSION N/A 11/01/2015   Procedure: CARDIOVERSION;  Surgeon: [name], MD;  Location: [facility] ENDOSCOPY;  Service: Cardiovascular;  Laterality: N/A;  . CARDIOVERSION N/A 11/23/2015   Procedure: CARDIOVERSION;  Surgeon: Nazareth Kirk [name], MD;  Location: [facility] ENDOSCOPY;  Service: Cardiovascular;  Laterality: N/A;  . COLONOSCOPY    . KNEE ARTHROSCOPY  2000  . SHOULDER ARTHROSCOPY WITH ROTATOR CUFF REPAIR AND SUBACROMIAL DECOMPRESSION Right 07/15/2012   Procedure: RIGHT ARTHROSCOPY SHOULDER DECOMPRESSION  SUBACROMIAL PARTIAL  ACROMIOPLASTY WITH CORACOACROMIAL RELEASE,  DISTAL CLAVICULECTOMY, resect biceps debride labrium WITH ROTATOR CUFF REPAIR;  Surgeon: [name], MD;  Location: [facility];  Service: Orthopedics;  Laterality: Right;     Current Outpatient Prescriptions  Medication Sig Dispense Refill  . atorvastatin (LIPITOR) 20 MG tablet Take 20 mg by mouth every evening.    Marland Kitchen ELIQUIS 5 MG TABS tablet TAKE 1 TABLET(5 MG) BY MOUTH TWICE DAILY 60 tablet 3  . metoprolol tartrate (LOPRESSOR) 25 MG tablet Take 1 tablet (25 mg total) by mouth 2 (two) times daily. 180 tablet 1  . MULTAQ 400 MG tablet TAKE 1 TABLET(400 MG) BY MOUTH TWICE DAILY WITH A MEAL 60 tablet 7  . OVER THE COUNTER MEDICATION Take 1 tablet by mouth daily. For the prostate, herbal supplement    . zaleplon (SONATA) 5 MG capsule Take 1 capsule by mouth at bedtime as needed for sleep.   1   Current Facility-Administered Medications  Medication Dose Route Frequency Provider Last Rate Last Dose  . cefTRIAXone (ROCEPHIN) injection 1 g  1 g Intramuscular Q24H Jonita Albee, MD   1 g at 11/18/12 1032    Allergies:   Patient has no known allergies.   Social History:  The patient  reports that he quit smoking about 43 years ago. He has never used smokeless tobacco. He reports that he drinks alcohol. He reports that he does not use drugs.   Family History:  The patient's family history includes Cancer in his sister; Clotting disorder in his mother; Heart disease in his brother; Heart failure  in his father; Hypertension in his sister; Other in his mother.    ROS:  Please see the history of present illness.   Otherwise, review of systems is positive for cough.   All other systems are reviewed and negative.   PHYSICAL EXAM: VS:  BP 138/86   Pulse (!) 55   Ht 5\' 11"  (1.803 m)   Wt 297 lb (134.7 kg)   SpO2 96%   BMI 41.42 kg/m  , BMI Body mass index is 41.42 kg/m. GEN: Well nourished, well developed, in no acute distress  HEENT:  normal  Neck: no JVD, carotid bruits, or masses Cardiac: RRR; no murmurs, rubs, or gallops,no edema  Respiratory:  clear to auscultation bilaterally, normal work of breathing GI: soft, nontender, nondistended, + BS MS: no deformity or atrophy  Skin: warm and dry Neuro:  Strength and sensation are intact Psych: euthymic mood, full affect  EKG:  EKG is ordered today. Personal review of the ekg ordered shows sinus rhythm, rate 55  Recent Labs: No results found for requested labs within last 8760 hours.    Lipid Panel  No results found for: CHOL, TRIG, HDL, CHOLHDL, VLDL, LDLCALC, LDLDIRECT   Wt Readings from Last 3 Encounters:  02/16/17 297 lb (134.7 kg)  08/18/16 (!) 302 lb 12.8 oz (137.3 kg)  05/27/16 298 lb (135.2 kg)      Other studies Reviewed: Additional studies/ records that were reviewed today include: TTE 09/2015 - Left ventricle: The cavity size was normal. Wall thickness was   increased in a pattern of moderate LVH. Systolic function was   normal. The estimated ejection fraction was in the range of 55%   to 60%. Indeterminant diastolic function. Wall motion was normal;   there were no regional wall motion abnormalities. - Aortic valve: There was no stenosis. There was trivial   regurgitation. - Mitral valve: Mildly calcified annulus. There was mild   regurgitation. - Left atrium: The atrium was mildly dilated. - Right ventricle: The cavity size was normal. Systolic function   was normal. - Right atrium: The atrium was mildly dilated. - Tricuspid valve: Peak RV-RA gradient (S): 26 mm Hg. - Pulmonary arteries: PA peak pressure: 29 mm Hg (S). - Inferior vena cava: The vessel was normal in size. The   respirophasic diameter changes were in the normal range (= 50%),   consistent with normal central venous pressure.  ASSESSMENT AND PLAN:  1.  Persistent atrial fibrillation: On Eliquis and Multaq. Was previously on amiodarone but that was stopped due to the  possibility of long term side effects. Remained in sinus rhythm. No changes at this time.  This patients CHA2DS2-VASc Score and unadjusted Ischemic Stroke Rate (% per year) is equal to 2.2 % stroke rate/year from a score of 2  Above score calculated as 1 point each if present [CHF, HTN, DM, Vascular=MI/PAD/Aortic Plaque, Age if 65-74, or Male] Above score calculated as 2 points each if present [Age > 75, or Stroke/TIA/TE]  2. Hypertension: Blood pressure is well-controlled today. Has stopped lisinopril due to chronic cough. Continue current monitoring.  3. OSA: Encouraged CPAP compliance   Current medicines are reviewed at length with the patient today.   The patient does not have concerns regarding his medicines.  The following changes were made today:  Stop lisinopril  Labs/ tests ordered today include:  Orders Placed This Encounter  Procedures  . EKG 12-Lead     Disposition:   FU with Brittiny Levitz 6 months  Signed, Halley Kincer [name], MD  02/16/2017 4:19 PM     [facility] [address] [place] [address] [phone] (office) [phone] (fax)

## 2017-06-08 ENCOUNTER — Other Ambulatory Visit: Payer: Self-pay | Admitting: Cardiology

## 2017-06-08 NOTE — Telephone Encounter (Addendum)
Eliquis 5mg  refill request received; pt is 72 yrs old, Wt-134.7kg, lase seen by Dr. Elberta Fortis on 02/16/17, last Crea-1.00 on 11/23/2015-will call PCP for updated labs. PCP faxed labs from 11/20/16 & Crea-1.20; will send in refill to requested Pharmacy.

## 2017-06-11 DIAGNOSIS — R195 Other fecal abnormalities: Secondary | ICD-10-CM | POA: Diagnosis not present

## 2017-06-11 DIAGNOSIS — Z23 Encounter for immunization: Secondary | ICD-10-CM | POA: Diagnosis not present

## 2017-06-11 DIAGNOSIS — Z0000 Encounter for general adult medical examination without abnormal findings: Secondary | ICD-10-CM | POA: Diagnosis not present

## 2017-06-11 DIAGNOSIS — Z1389 Encounter for screening for other disorder: Secondary | ICD-10-CM | POA: Diagnosis not present

## 2017-07-17 ENCOUNTER — Encounter: Payer: Self-pay | Admitting: Cardiology

## 2017-07-28 ENCOUNTER — Encounter: Payer: Self-pay | Admitting: Cardiology

## 2017-07-28 ENCOUNTER — Ambulatory Visit: Payer: Medicare Other | Admitting: Cardiology

## 2017-07-28 VITALS — BP 144/86 | HR 52 | Ht 71.0 in | Wt 298.0 lb

## 2017-07-28 DIAGNOSIS — I10 Essential (primary) hypertension: Secondary | ICD-10-CM | POA: Diagnosis not present

## 2017-07-28 DIAGNOSIS — I481 Persistent atrial fibrillation: Secondary | ICD-10-CM

## 2017-07-28 DIAGNOSIS — G4733 Obstructive sleep apnea (adult) (pediatric): Secondary | ICD-10-CM | POA: Diagnosis not present

## 2017-07-28 DIAGNOSIS — I4819 Other persistent atrial fibrillation: Secondary | ICD-10-CM

## 2017-07-28 NOTE — Patient Instructions (Signed)
Medication Instructions:  Your physician recommends that you continue on your current medications as directed. Please refer to the Current Medication list given to you today.  Labwork: None ordered  Testing/Procedures: None ordered  Follow-Up: Your physician wants you to follow-up in: 1 year with Dr. Camnitz.  You will receive a reminder letter in the mail two months in advance. If you don't receive a letter, please call our office to schedule the follow-up appointment.  * If you need a refill on your cardiac medications before your next appointment, please call your pharmacy.   *Please note that any paperwork needing to be filled out by the provider will need to be addressed at the front desk prior to seeing the provider. Please note that any FMLA, disability or other documents regarding health condition is subject to a $25.00 charge that must be received prior to completion of paperwork in the form of a money order or check.  Thank you for choosing [facility]!!   Yalena Colon, RN [phone]        

## 2017-07-28 NOTE — Progress Notes (Signed)
Electrophysiology Office Note   Date:  07/28/2017   ID:  [name], DOB 1946/03/10, MRN [id number]  PCP:  Ronald Dills, MD   Primary Electrophysiologist:  Ronald Andress [name], MD    Chief Complaint  Patient presents with  . Follow-up    Persistent Afib     History of Present Illness: Ronald Hull is a 72 y.o. male who presents today for electrophysiology evaluation.   He presented to his primary physician on 10/02/15 with an elevated and irregular heart rate. Cardioversion was attempted and was unsuccessful with repeat cardioversion successfully to 360 J. He was initially placed on amiodarone and has since been switched to Multaq. He is doing well without major complaints. He was having a chronic dry hacking cough, but stopped his lisinopril and the cough went away. He has noted no further episodes of atrial fibrillation.  Today, denies symptoms of palpitations, chest pain, shortness of breath, orthopnea, PND, lower extremity edema, claudication, dizziness, presyncope, syncope, bleeding, or neurologic sequela. The patient is tolerating medications without difficulties.  He is feeling well.  He has not noted any further episodes of atrial fibrillation.   Past Medical History:  Diagnosis Date  . Arthritis   . Complication of anesthesia    BP dropped post colonoscopy  . Hypercholesteremia   . Hypertension   . Sleep apnea    does use a cpap  . Wears glasses    Past Surgical History:  Procedure Laterality Date  . CARDIOVERSION N/A 11/01/2015   Procedure: CARDIOVERSION;  Surgeon: Ronald Nose, MD;  Location: [facility] ENDOSCOPY;  Service: Cardiovascular;  Laterality: N/A;  . CARDIOVERSION N/A 11/23/2015   Procedure: CARDIOVERSION;  Surgeon: Ronald Kanner [name], MD;  Location: [facility] ENDOSCOPY;  Service: Cardiovascular;  Laterality: N/A;  . COLONOSCOPY    . KNEE ARTHROSCOPY  2000  . SHOULDER ARTHROSCOPY WITH ROTATOR CUFF REPAIR AND SUBACROMIAL DECOMPRESSION Right 07/15/2012   Procedure: RIGHT ARTHROSCOPY SHOULDER DECOMPRESSION  SUBACROMIAL PARTIAL ACROMIOPLASTY WITH CORACOACROMIAL RELEASE,  DISTAL CLAVICULECTOMY, resect biceps debride labrium WITH ROTATOR CUFF REPAIR;  Surgeon: Ronald Ave, MD;  Location: [facility];  Service: Orthopedics;  Laterality: Right;     Current Outpatient Medications  Medication Sig Dispense Refill  . atorvastatin (LIPITOR) 20 MG tablet Take 20 mg by mouth every evening.    Marland Kitchen ELIQUIS 5 MG TABS tablet TAKE 1 TABLET(5 MG) BY MOUTH TWICE DAILY 60 tablet 5  . metoprolol tartrate (LOPRESSOR) 25 MG tablet Take 1 tablet (25 mg total) by mouth 2 (two) times daily. 180 tablet 1  . MULTAQ 400 MG tablet TAKE 1 TABLET(400 MG) BY MOUTH TWICE DAILY WITH A MEAL 60 tablet 7  . OVER THE COUNTER MEDICATION Take 1 tablet by mouth daily. For the prostate, herbal supplement    . zaleplon (SONATA) 5 MG capsule Take 1 capsule by mouth at bedtime as needed for sleep.   1   Current Facility-Administered Medications  Medication Dose Route Frequency Provider Last Rate Last Dose  . cefTRIAXone (ROCEPHIN) injection 1 g  1 g Intramuscular Q24H Ronald Albee, MD   1 g at 11/18/12 1032    Allergies:   Lisinopril   Social History:  The patient  reports that he quit smoking about 44 years ago. He has never used smokeless tobacco. He reports that he drinks alcohol. He reports that he does not use drugs.   Family History:  The patient's family history includes Cancer in his sister; Clotting disorder in his  mother; Heart disease in his brother; Heart failure in his father; Hypertension in his sister; Other in his mother.    ROS:  Please see the history of present illness.   Otherwise, review of systems is positive for difficulty urinating.   All other systems are reviewed and negative.   PHYSICAL EXAM: VS:  BP (!) 144/86   Pulse (!) 52   Ht 5\' 11"  (1.803 m)   Wt 298 lb (135.2 kg)   SpO2 98%   BMI 41.56 kg/m  , BMI Body mass index is 41.56  kg/m. GEN: Well nourished, well developed, in no acute distress  HEENT: normal  Neck: no JVD, carotid bruits, or masses Cardiac: RRR; no murmurs, rubs, or gallops,no edema  Respiratory:  clear to auscultation bilaterally, normal work of breathing GI: soft, nontender, nondistended, + BS MS: no deformity or atrophy  Skin: warm and dry Neuro:  Strength and sensation are intact Psych: euthymic mood, full affect  EKG:  EKG is ordered today. Personal review of the ekg ordered shows sinus rhythm, rate 52   Recent Labs: No results found for requested labs within last 8760 hours.    Lipid Panel  No results found for: CHOL, TRIG, HDL, CHOLHDL, VLDL, LDLCALC, LDLDIRECT   Wt Readings from Last 3 Encounters:  07/28/17 298 lb (135.2 kg)  02/16/17 297 lb (134.7 kg)  08/18/16 (!) 302 lb 12.8 oz (137.3 kg)      Other studies Reviewed: Additional studies/ records that were reviewed today include: TTE 09/2015 - Left ventricle: The cavity size was normal. Wall thickness was   increased in a pattern of moderate LVH. Systolic function was   normal. The estimated ejection fraction was in the range of 55%   to 60%. Indeterminant diastolic function. Wall motion was normal;   there were no regional wall motion abnormalities. - Aortic valve: There was no stenosis. There was trivial   regurgitation. - Mitral valve: Mildly calcified annulus. There was mild   regurgitation. - Left atrium: The atrium was mildly dilated. - Right ventricle: The cavity size was normal. Systolic function   was normal. - Right atrium: The atrium was mildly dilated. - Tricuspid valve: Peak RV-RA gradient (S): 26 mm Hg. - Pulmonary arteries: PA peak pressure: 29 mm Hg (S). - Inferior vena cava: The vessel was normal in size. The   respirophasic diameter changes were in the normal range (= 50%),   consistent with normal central venous pressure.  ASSESSMENT AND PLAN:  1.  Persistent atrial fibrillation: Currently on  Eliquis and Multitak.  Was previously on amiodarone but this was stopped due to long-term side effects.  Has remained in sinus rhythm.  No changes at this time.    This patients CHA2DS2-VASc Score and unadjusted Ischemic Stroke Rate (% per year) is equal to 2.2 % stroke rate/year from a score of 2  Above score calculated as 1 point each if present [CHF, HTN, DM, Vascular=MI/PAD/Aortic Plaque, Age if 65-74, or Male] Above score calculated as 2 points each if present [Age > 75, or Stroke/TIA/TE]  2. Hypertension: Mildly elevated but has been normal in the past.  No changes at this time.  3. OSA: CPAP compliance encouraged   Current medicines are reviewed at length with the patient today.   The patient does not have concerns regarding his medicines.  The following changes were made today:  none  Labs/ tests ordered today include:  Orders Placed This Encounter  Procedures  . EKG 12-Lead  Disposition:   FU with Jachelle Fluty 12 months  Signed, Aparna Vanderweele [name], MD  07/28/2017 3:15 PM     [facility] [address] [place] [address] [phone] (office) [phone] (fax)

## 2017-08-04 ENCOUNTER — Telehealth: Payer: Self-pay

## 2017-08-04 NOTE — Telephone Encounter (Signed)
   [facility] Pre-operative Risk Assessment    Request for surgical clearance:  1. What type of surgery is being performed? Colonoscopy   2. When is this surgery scheduled? 09/04/2017   3. What type of clearance is required (medical clearance vs. Pharmacy clearance to hold med vs. Both)? pharmacy  4. Are there any medications that need to be held prior to surgery and how long?Eliquis   5. Practice name and name of physician performing surgery? [facility]; Dr Watt Climes   6. What is your office phone and fax number? P: [phone] F:[phone]   7. Anesthesia type (None, local, MAC, general) ? MAC   Ronald Hull 08/04/2017, 3:10 PM  _________________________________________________________________   (provider comments below)

## 2017-08-06 NOTE — Telephone Encounter (Signed)
Patient with diagnosis of Afib on Eliquis for anticoagulation.    Procedure: colonoscopy Date of procedure: 09/04/17  CHADS2-VASc score of  2 (CHF, HTN, AGE, DM2, stroke/tia x 2, CAD, AGE, male)  CrCl 106 ml/min  Per office protocol, patient can hold Eliquis for 24 hours prior to procedure.

## 2017-08-06 NOTE — Telephone Encounter (Signed)
See attached

## 2017-08-06 NOTE — Telephone Encounter (Signed)
Faxed back to number provided via Epic 

## 2017-08-12 ENCOUNTER — Other Ambulatory Visit: Payer: Self-pay | Admitting: Cardiology

## 2017-08-25 DIAGNOSIS — M25512 Pain in left shoulder: Secondary | ICD-10-CM | POA: Diagnosis not present

## 2017-08-25 DIAGNOSIS — M25552 Pain in left hip: Secondary | ICD-10-CM | POA: Diagnosis not present

## 2017-09-07 ENCOUNTER — Other Ambulatory Visit: Payer: Self-pay | Admitting: Cardiology

## 2017-09-08 DIAGNOSIS — M25512 Pain in left shoulder: Secondary | ICD-10-CM | POA: Diagnosis not present

## 2017-09-09 ENCOUNTER — Other Ambulatory Visit: Payer: Self-pay | Admitting: Orthopedic Surgery

## 2017-09-09 ENCOUNTER — Ambulatory Visit (HOSPITAL_COMMUNITY)
Admission: RE | Admit: 2017-09-09 | Discharge: 2017-09-09 | Disposition: A | Discharge status: Home / Self Care | Payer: Medicare Other | Source: Ambulatory Visit | Attending: Internal Medicine | Admitting: Internal Medicine

## 2017-09-09 DIAGNOSIS — M7989 Other specified soft tissue disorders: Secondary | ICD-10-CM | POA: Diagnosis not present

## 2017-09-09 DIAGNOSIS — M79605 Pain in left leg: Secondary | ICD-10-CM | POA: Diagnosis not present

## 2017-09-11 DIAGNOSIS — H2513 Age-related nuclear cataract, bilateral: Secondary | ICD-10-CM | POA: Diagnosis not present

## 2017-09-30 ENCOUNTER — Telehealth: Payer: Self-pay | Admitting: Cardiology

## 2017-09-30 ENCOUNTER — Encounter: Payer: Self-pay | Admitting: Cardiology

## 2017-09-30 NOTE — Telephone Encounter (Signed)
Pt reports waking up, around 7am, this morning in Afib.   HR when he wakes is usually 50-60s, this morning it has been variable b/t 70-110s. Pt is currently still in Afib.  Denies SOB, syncope.  He does confirm he can tell he is out of rhythm and "doesn't feel well", slightly dizzy.  Pt aware I will review with Dr. Elberta Fortis and call him w/ recommendation/s.

## 2017-09-30 NOTE — Telephone Encounter (Signed)
New Message   Patient c/o Palpitations:  High priority if patient c/o lightheadedness, shortness of breath, or chest pain  1) How long have you had palpitations/irregular HR/ Afib? Are you having the symptoms now? Since this morning and yes  2) Are you currently experiencing lightheadedness, SOB or CP? no  3) Do you have a history of afib (atrial fibrillation) or irregular heart rhythm? yes  4) Have you checked your BP or HR? (document readings if available): yes hr 70-110  [id number]  5) Are you experiencing any other symptoms? no

## 2017-09-30 NOTE — Telephone Encounter (Signed)
Follow up   Patient calling back regarding afib. Declined to schedule appt. Requesting nurse call

## 2017-09-30 NOTE — Telephone Encounter (Signed)
Advised pt needs to be seen in office per Dr. Elberta Fortis. Pt scheduled to see Dr. Elberta Fortis tomorrow afternoon (no availability in the AFib clinic). Patient verbalized understanding and agreeable to plan.

## 2017-10-01 ENCOUNTER — Encounter: Payer: Self-pay | Admitting: Cardiology

## 2017-10-01 ENCOUNTER — Encounter (INDEPENDENT_AMBULATORY_CARE_PROVIDER_SITE_OTHER): Payer: Self-pay

## 2017-10-01 ENCOUNTER — Ambulatory Visit: Payer: Medicare Other | Admitting: Cardiology

## 2017-10-01 VITALS — BP 136/80 | HR 97 | Ht 71.0 in | Wt 294.2 lb

## 2017-10-01 DIAGNOSIS — I10 Essential (primary) hypertension: Secondary | ICD-10-CM | POA: Diagnosis not present

## 2017-10-01 DIAGNOSIS — G4733 Obstructive sleep apnea (adult) (pediatric): Secondary | ICD-10-CM | POA: Diagnosis not present

## 2017-10-01 DIAGNOSIS — I481 Persistent atrial fibrillation: Secondary | ICD-10-CM

## 2017-10-01 DIAGNOSIS — I4819 Other persistent atrial fibrillation: Secondary | ICD-10-CM

## 2017-10-01 NOTE — Progress Notes (Signed)
Electrophysiology Office Note   Date:  10/01/2017   ID:  Ronald Hull, DOB February 25, 1946, MRN [id number]  PCP:  Renford Dills, MD   Primary Electrophysiologist:  Regan Lemming, MD    No chief complaint on file.    History of Present Illness: Ronald Hull is a 72 y.o. male who presents today for electrophysiology evaluation.   He presented to his primary physician on 10/02/15 with an elevated and irregular heart rate. Cardioversion was attempted and was unsuccessful with repeat cardioversion successfully to 360 J. He was initially placed on amiodarone and has since been switched to Multaq.   Today, denies symptoms of palpitations, chest pain, shortness of breath, orthopnea, PND, lower extremity edema, claudication, dizziness, presyncope, syncope, bleeding, or neurologic sequela. The patient is tolerating medications without difficulties.  Fortunately, he did go into atrial fibrillation yesterday.  His main complaints are weakness, fatigue, and shortness of breath.  This occurs when he does just about any activity.  Past Medical History:  Diagnosis Date  . Arthritis   . Complication of anesthesia    BP dropped post colonoscopy  . Hypercholesteremia   . Hypertension   . Sleep apnea    does use a cpap  . Wears glasses    Past Surgical History:  Procedure Laterality Date  . CARDIOVERSION N/A 11/01/2015   Procedure: CARDIOVERSION;  Surgeon: [name], MD;  Location: [facility] ENDOSCOPY;  Service: Cardiovascular;  Laterality: N/A;  . CARDIOVERSION N/A 11/23/2015   Procedure: CARDIOVERSION;  Surgeon: Esmee Fallaw [name], MD;  Location: [facility] ENDOSCOPY;  Service: Cardiovascular;  Laterality: N/A;  . COLONOSCOPY    . KNEE ARTHROSCOPY  2000  . SHOULDER ARTHROSCOPY WITH ROTATOR CUFF REPAIR AND SUBACROMIAL DECOMPRESSION Right 07/15/2012   Procedure: RIGHT ARTHROSCOPY SHOULDER DECOMPRESSION  SUBACROMIAL PARTIAL ACROMIOPLASTY WITH CORACOACROMIAL RELEASE,  DISTAL CLAVICULECTOMY, resect  biceps debride labrium WITH ROTATOR CUFF REPAIR;  Surgeon: [name], MD;  Location: [facility];  Service: Orthopedics;  Laterality: Right;     Current Outpatient Medications  Medication Sig Dispense Refill  . atorvastatin (LIPITOR) 20 MG tablet Take 20 mg by mouth every evening.    Marland Kitchen ELIQUIS 5 MG TABS tablet TAKE 1 TABLET(5 MG) BY MOUTH TWICE DAILY 60 tablet 5  . metoprolol tartrate (LOPRESSOR) 25 MG tablet Take 1 tablet (25 mg total) by mouth 2 (two) times daily. 180 tablet 1  . MULTAQ 400 MG tablet TAKE 1 TABLET(400 MG) BY MOUTH TWICE DAILY WITH A MEAL 60 tablet 11  . OVER THE COUNTER MEDICATION Take 1 tablet by mouth daily. For the prostate, herbal supplement     Current Facility-Administered Medications  Medication Dose Route Frequency Provider Last Rate Last Dose  . cefTRIAXone (ROCEPHIN) injection 1 g  1 g Intramuscular Q24H Jonita Albee, MD   1 g at 11/18/12 1032    Allergies:   Lisinopril   Social History:  The patient  reports that he quit smoking about 44 years ago. He has never used smokeless tobacco. He reports that he drinks alcohol. He reports that he does not use drugs.   Family History:  The patient's family history includes Cancer in his sister; Clotting disorder in his mother; Heart disease in his brother; Heart failure in his father; Hypertension in his sister; Other in his mother.    ROS:  Please see the history of present illness.   Otherwise, review of systems is positive for fatigue, shortness of breath.   All other systems  are reviewed and negative.   PHYSICAL EXAM: VS:  BP 136/80   Pulse 97   Ht 5\' 11"  (1.803 m)   Wt 294 lb 3.2 oz (133.4 kg)   SpO2 96%   BMI 41.03 kg/m  , BMI Body mass index is 41.03 kg/m. GEN: Well nourished, well developed, in no acute distress  HEENT: normal  Neck: no JVD, carotid bruits, or masses Cardiac: iRRR; no murmurs, rubs, or gallops,no edema  Respiratory:  clear to auscultation bilaterally, normal  work of breathing GI: soft, nontender, nondistended, + BS MS: no deformity or atrophy  Skin: warm and dry Neuro:  Strength and sensation are intact Psych: euthymic mood, full affect  EKG:  EKG is ordered today. Personal review of the ekg ordered shows atrial fibrillation, rate 97  Recent Labs: No results found for requested labs within last 8760 hours.    Lipid Panel  No results found for: CHOL, TRIG, HDL, CHOLHDL, VLDL, LDLCALC, LDLDIRECT   Wt Readings from Last 3 Encounters:  10/01/17 294 lb 3.2 oz (133.4 kg)  07/28/17 298 lb (135.2 kg)  02/16/17 297 lb (134.7 kg)      Other studies Reviewed: Additional studies/ records that were reviewed today include: TTE 09/2015 - Left ventricle: The cavity size was normal. Wall thickness was   increased in a pattern of moderate LVH. Systolic function was   normal. The estimated ejection fraction was in the range of 55%   to 60%. Indeterminant diastolic function. Wall motion was normal;   there were no regional wall motion abnormalities. - Aortic valve: There was no stenosis. There was trivial   regurgitation. - Mitral valve: Mildly calcified annulus. There was mild   regurgitation. - Left atrium: The atrium was mildly dilated. - Right ventricle: The cavity size was normal. Systolic function   was normal. - Right atrium: The atrium was mildly dilated. - Tricuspid valve: Peak RV-RA gradient (S): 26 mm Hg. - Pulmonary arteries: PA peak pressure: 29 mm Hg (S). - Inferior vena cava: The vessel was normal in size. The   respirophasic diameter changes were in the normal range (= 50%),   consistent with normal central venous pressure.  ASSESSMENT AND PLAN:  1.  Persistent atrial fibrillation: On Eliquis and Multaq.  Fortunately, he did go into atrial fibrillation yesterday.  He would like to continue his antiarrhythmics.  He has missed no doses of his Eliquis.  We Ronald Hull plan for cardioversion.  This patients CHA2DS2-VASc Score and  unadjusted Ischemic Stroke Rate (% per year) is equal to 2.2 % stroke rate/year from a score of 2  Above score calculated as 1 point each if present [CHF, HTN, DM, Vascular=MI/PAD/Aortic Plaque, Age if 65-74, or Male] Above score calculated as 2 points each if present [Age > 75, or Stroke/TIA/TE]  2. Hypertension: Mildly elevated today but he is in atrial fibrillation and quite stressed.  Ronald Hull reassess when he is back in normal rhythm.  3. OSA: CPAP compliance encouraged   Current medicines are reviewed at length with the patient today.   The patient does not have concerns regarding his medicines.  The following changes were made today: None  Labs/ tests ordered today include:  Orders Placed This Encounter  Procedures  . EKG 12-Lead     Disposition:   FU with Roshanda Balazs 3 months  Signed, Chauncey Bruno [name], MD  10/01/2017 3:55 PM     [facility] [address] [place] [address] [phone] (office) [phone] (  fax)

## 2017-10-01 NOTE — Patient Instructions (Signed)
Medication Instructions:  Your physician recommends that you continue on your current medications as directed. Please refer to the Current Medication list given to you today.  Labwork: None ordered     *We will only notify you of abnormal results, otherwise continue current treatment plan.  Testing/Procedures: Roanna Raider, RN will see if she can arrange a cardioversion for tomorrow.  She will call you in the morning and let you know.  Follow-Up: Keep scheduled follow up next year.   * If you need a refill on your cardiac medications before your next appointment, please call your pharmacy.   *Please note that any paperwork needing to be filled out by the provider will need to be addressed at the front desk prior to seeing the provider. Please note that any FMLA, disability or other documents regarding health condition is subject to a $25.00 charge that must be received prior to completion of paperwork in the form of a money order or check.  Thank you for choosing [facility]!!   Dory Horn, RN [phone]

## 2017-10-02 ENCOUNTER — Encounter (INDEPENDENT_AMBULATORY_CARE_PROVIDER_SITE_OTHER): Payer: Self-pay

## 2017-10-05 DIAGNOSIS — R69 Illness, unspecified: Secondary | ICD-10-CM | POA: Diagnosis not present

## 2017-10-12 ENCOUNTER — Ambulatory Visit (HOSPITAL_COMMUNITY)
Admission: RE | Admit: 2017-10-12 | Discharge: 2017-10-12 | Disposition: A | Discharge status: Home / Self Care | Payer: Medicare Other | Source: Ambulatory Visit | Attending: Family Medicine | Admitting: Family Medicine

## 2017-10-12 ENCOUNTER — Ambulatory Visit (HOSPITAL_COMMUNITY): Payer: Medicare Other | Admitting: Certified Registered Nurse Anesthetist

## 2017-10-12 ENCOUNTER — Ambulatory Visit (HOSPITAL_COMMUNITY)
Admission: RE | Admit: 2017-10-12 | Discharge: 2017-10-12 | Disposition: A | Discharge status: Home / Self Care | Payer: Medicare Other | Source: Ambulatory Visit | Attending: Internal Medicine | Admitting: Internal Medicine

## 2017-10-12 ENCOUNTER — Other Ambulatory Visit: Payer: Self-pay

## 2017-10-12 ENCOUNTER — Encounter (HOSPITAL_COMMUNITY)
Admission: RE | Disposition: A | Discharge status: Home / Self Care | Payer: Self-pay | Source: Ambulatory Visit | Attending: Internal Medicine

## 2017-10-12 ENCOUNTER — Encounter (HOSPITAL_COMMUNITY): Payer: Self-pay

## 2017-10-12 DIAGNOSIS — I4891 Unspecified atrial fibrillation: Secondary | ICD-10-CM | POA: Insufficient documentation

## 2017-10-12 DIAGNOSIS — I10 Essential (primary) hypertension: Secondary | ICD-10-CM | POA: Insufficient documentation

## 2017-10-12 DIAGNOSIS — Z87891 Personal history of nicotine dependence: Secondary | ICD-10-CM | POA: Diagnosis not present

## 2017-10-12 HISTORY — PX: CARDIOVERSION: SHX1299

## 2017-10-12 SURGERY — CARDIOVERSION
Anesthesia: General

## 2017-10-12 MED ORDER — LIDOCAINE HCL (CARDIAC) PF 100 MG/5ML IV SOSY
PREFILLED_SYRINGE | INTRAVENOUS | Status: DC | PRN
  Administered 2017-10-12: 70 mg via INTRAVENOUS
  Administered 2017-10-12: 40 mg via INTRAVENOUS
  Administered 2017-10-12: 30 mg via INTRAVENOUS

## 2017-10-12 MED ORDER — SODIUM CHLORIDE 0.9 % IV SOLN
INTRAVENOUS | Status: DC
  Administered 2017-10-12: 09:00:00 via INTRAVENOUS

## 2017-10-12 NOTE — Discharge Instructions (Signed)
Electrical Cardioversion, Care After °This sheet gives you information about how to care for yourself after your procedure. Your health care provider may also give you more specific instructions. If you have problems or questions, contact your health care provider. °What can I expect after the procedure? °After the procedure, it is common to have: °· Some redness on the skin where the shocks were given. ° °Follow these instructions at home: °· Do not drive for 24 hours if you were given a medicine to help you relax (sedative). °· Take over-the-counter and prescription medicines only as told by your health care provider. °· Ask your health care provider how to check your pulse. Check it often. °· Rest for 48 hours after the procedure or as told by your health care provider. °· Avoid or limit your caffeine use as told by your health care provider. °Contact a health care provider if: °· You feel like your heart is beating too quickly or your pulse is not regular. °· You have a serious muscle cramp that does not go away. °Get help right away if: °· You have discomfort in your chest. °· You are dizzy or you feel faint. °· You have trouble breathing or you are short of breath. °· Your speech is slurred. °· You have trouble moving an arm or leg on one side of your body. °· Your fingers or toes turn cold or blue. °This information is not intended to replace advice given to you by your health care provider. Make sure you discuss any questions you have with your health care provider. °Document Released: 02/02/2013 Document Revised: 11/16/2015 Document Reviewed: 10/19/2015 °Elsevier Interactive Patient Education © 2018 [employer]vier Inc. ° °

## 2017-10-12 NOTE — Anesthesia Preprocedure Evaluation (Addendum)
Anesthesia Evaluation  Patient identified by MRN, date of birth, ID band Patient awake    Reviewed: Allergy & Precautions, NPO status , Patient's Chart, lab work & pertinent test results  Airway Mallampati: II  TM Distance: >3 FB Neck ROM: Full    Dental   Pulmonary former smoker,    breath sounds clear to auscultation       Cardiovascular hypertension,  Rhythm:Irregular Rate:Tachycardia     Neuro/Psych    GI/Hepatic   Endo/Other    Renal/GU      Musculoskeletal   Abdominal   Peds  Hematology   Anesthesia Other Findings   Reproductive/Obstetrics                            Anesthesia Physical Anesthesia Plan  ASA: III  Anesthesia Plan: General   Post-op Pain Management:    Induction: Intravenous  PONV Risk Score and Plan: Propofol infusion  Airway Management Planned: Mask  Additional Equipment:   Intra-op Plan:   Post-operative Plan:   Informed Consent: I have reviewed the patients History and Physical, chart, labs and discussed the procedure including the risks, benefits and alternatives for the proposed anesthesia with the patient or authorized representative who has indicated his/her understanding and acceptance.   Dental advisory given  Plan Discussed with: CRNA and Anesthesiologist  Anesthesia Plan Comments:        Anesthesia Quick Evaluation

## 2017-10-12 NOTE — CV Procedure (Signed)
   CARDIOVERSION NOTE  Procedure: Electrical Cardioversion Indications:  Atrial Fibrillation  Procedure Details:  Consent: Risks of procedure as well as the alternatives and risks of each were explained to the (patient/caregiver).  Consent for procedure obtained.  Time Out: Verified patient identification, verified procedure, site/side was marked, verified correct patient position, special equipment/implants available, medications/allergies/relevent history reviewed, required imaging and test results available.  Performed  Patient placed on cardiac monitor, pulse oximetry, supplemental oxygen as necessary.  Sedation given: Propofol per anesthesia Pacer pads placed anterior and posterior chest.  Cardioverted 2 time(s).  Cardioverted at 200J x 2 biphasic.  Impression: Findings: Post procedure EKG shows: NSR Complications: None Patient did tolerate procedure well.  Plan: 1. Successful DCCV to NSR with 2 stacked 200J shocks.  Time Spent Directly with the Patient:  30 minutes   [name], MD, [facility], FACP  [place]  [facility]  Medical Director of the Advanced Lipid Disorders &  Cardiovascular Risk Reduction Clinic Diplomate of the American Board of Clinical Lipidology Attending Cardiologist  Direct Dial: [phone]  Fax: [phone]  Website:  [email or web address] Nicely Jermya Dowding 10/12/2017, 9:14 AM

## 2017-10-12 NOTE — Transfer of Care (Signed)
Immediate Anesthesia Transfer of Care Note  Patient: Ronald Hull  Procedure(s) Performed: CARDIOVERSION (N/A )  Patient Location: Endoscopy Unit  Anesthesia Type:General  Level of Consciousness: awake, alert , patient cooperative and lethargic  Airway & Oxygen Therapy: Patient Spontanous Breathing and Patient connected to nasal cannula oxygen  Post-op Assessment: Report given to RN, Post -op Vital signs reviewed and stable and Patient moving all extremities X 4  Post vital signs: Reviewed and stable  Last Vitals:  Vitals Value Taken Time  BP    Temp    Pulse    Resp    SpO2      Last Pain:  Vitals:   10/12/17 0827  TempSrc: Oral         Complications: No apparent anesthesia complications

## 2017-10-12 NOTE — H&P (Signed)
   INTERVAL PROCEDURE H&P  History and Physical Interval Note:  10/12/2017 8:58 AM  Ronald Hull has presented today for their planned procedure. The various methods of treatment have been discussed with the patient and family. After consideration of risks, benefits and other options for treatment, the patient has consented to the procedure.  The patients' outpatient history has been reviewed, patient examined, and no change in status from most recent office note within the past 30 days. I have reviewed the patients' chart and labs and will proceed as planned. Questions were answered to the patient's satisfaction.   [name], MD, [facility], FACP  [place]  [facility]  Medical Director of the Advanced Lipid Disorders &  Cardiovascular Risk Reduction Clinic Diplomate of the American Board of Clinical Lipidology Attending Cardiologist  Direct Dial: [phone]  Fax: [phone]  Website:  [email or web address] Nicely Ronald Hull 10/12/2017, 8:58 AM

## 2017-10-12 NOTE — Anesthesia Postprocedure Evaluation (Signed)
Anesthesia Post Note  Patient: PILOT ENGLES  Procedure(s) Performed: CARDIOVERSION (N/A )     Patient location during evaluation: Endoscopy Anesthesia Type: General Level of consciousness: awake and alert Pain management: pain level controlled Vital Signs Assessment: post-procedure vital signs reviewed and stable Respiratory status: spontaneous breathing, nonlabored ventilation, respiratory function stable and patient connected to nasal cannula oxygen Cardiovascular status: blood pressure returned to baseline and stable Postop Assessment: no apparent nausea or vomiting Anesthetic complications: no    Last Vitals:  Vitals:   10/12/17 0920 10/12/17 0930  BP: 128/65 (!) 129/51  Pulse: (!) 59 (!) 56  Resp: 17 12  Temp:    SpO2: 99% 97%    Last Pain:  Vitals:   10/12/17 0930  TempSrc:   PainSc: 0-No pain                 Mertie Haslem COKER

## 2017-10-14 ENCOUNTER — Encounter (HOSPITAL_COMMUNITY): Payer: Self-pay | Admitting: Internal Medicine

## 2017-10-20 ENCOUNTER — Telehealth: Payer: Self-pay

## 2017-10-20 ENCOUNTER — Telehealth: Payer: Self-pay | Admitting: Cardiology

## 2017-10-20 ENCOUNTER — Encounter: Payer: Self-pay | Admitting: Cardiology

## 2017-10-20 NOTE — Telephone Encounter (Signed)
Advised that he cannot stop anticoagulation for 4 weeks after cardioversion.  Pt thought this was the case and will call to reschedule colonoscopy. He also reports low HRs. Reports HRs avg 50s, but noticed a few in mid-uppers 40s.  Pt denies any symptoms w/ low HRs such as weakness/dizziness.  Informed pt that if his normal HR avg 50-60s he would be fine d/t his medications.  Informed that HRs documented in the office since 2017 have been 50s.  Educated why HRs may be low. This information put patient at ease and better understanding.  He appreciates the call.

## 2017-10-20 NOTE — Telephone Encounter (Signed)
New message   Pt is calling asking for a call back from RN.  

## 2017-10-20 NOTE — Telephone Encounter (Signed)
Had cardioversion 10/12/2017.  He needs uninterrupted anticoagulation for 4 weeks after cardioversion.  At that point, would be able to hold anticoagulation for procedures.  He would be at intermediate risk for a intermediate to low risk procedure.  No other medication changes needed at this time.

## 2017-10-20 NOTE — Telephone Encounter (Signed)
New Message:      Pt has a couple of questions about a colonoscopy he is due to have tomorrow

## 2017-10-20 NOTE — Telephone Encounter (Signed)
Two questions.  1 I had my cardioversion 6/17 and tomorrow which stopped my Afib. I have a colonoscopy at [facility] (Dr. Vida Rigger) scheduled tomorrow which originally was set for May before my recent Afib and cardioversion. My concern is going off Eliquis for up to two days ( 1 day before and possibly 1-2 after if theyfind polyps)    2.Next, I've observed my HR has dipped to 45-50 part of the day for last few days. This is new low HR for me. I'm worried about the anesthesia involved in the colonoscopy, maybe.    Dr. Marlane Hatcher office wants me to clear this question with cardiology before tomorrow. I've also put in a call to nurse Sherrie.   Called patient's wife (DPR) about patient's message. Informed patient's wife that patient needs to have a clearance sent to hold eliquis, and usually patient would not be holding eliquis so soon after a cardioversion. Informed patient's wife that patient might need to reschedule colonoscopy. Will send message to Dr. Elberta Fortis and his nurse.

## 2017-10-21 NOTE — Telephone Encounter (Signed)
See  Other phone note from 10/12/17. Dr. Gershon Crane nurse addressed message.

## 2017-11-09 DIAGNOSIS — D225 Melanocytic nevi of trunk: Secondary | ICD-10-CM | POA: Diagnosis not present

## 2017-11-09 DIAGNOSIS — I872 Venous insufficiency (chronic) (peripheral): Secondary | ICD-10-CM | POA: Diagnosis not present

## 2017-11-09 DIAGNOSIS — L821 Other seborrheic keratosis: Secondary | ICD-10-CM | POA: Diagnosis not present

## 2017-11-23 ENCOUNTER — Telehealth: Payer: Self-pay

## 2017-11-23 NOTE — Telephone Encounter (Signed)
   [facility] Pre-operative Risk Assessment    Request for surgical clearance:  1. What type of surgery is being performed? Colonscopy  2. When is this surgery scheduled? TBD  3. What type of clearance is required?  Pharmacy  4. Are there any medications that need to be held prior to surgery and how long?  Eliquis  5. Practice name and name of physician performing surgery? [facility]  6. What is your office phone number  [phone]   7.   What is your office fax number [phone]  8.   Anesthesia type (None, local, MAC, general) ? Propofol  Ronald Hull 11/23/2017, 10:20 AM  _________________________________________________________________   (provider comments below)

## 2017-11-23 NOTE — Telephone Encounter (Signed)
Pt takes Eliquis for afib with CHADS2VASc score of 2 (age, HTN). He underwent DCCV on 6/17 and has completed 4 weeks of uninterrupted anticoagulation since that time. Renal function is normal. Ok to hold Eliquis for 1-2 days prior to colonoscopy as required.

## 2017-11-24 NOTE — Telephone Encounter (Signed)
I have called pt and left message for him to call back.   See Dr. Elberta Fortis note from 10/20/17  Note    Had cardioversion 10/12/2017.  He needs uninterrupted anticoagulation for 4 weeks after cardioversion.  At that point, would be able to hold anticoagulation for procedures.  He would be at intermediate risk for a intermediate to low risk procedure.  No other medication changes needed at this time

## 2017-11-25 NOTE — Telephone Encounter (Signed)
   Primary Cardiologist:Will [name], MD  Chart reviewed as part of pre-operative protocol coverage. Pre-op clearance already addressed by colleagues in earlier phone notes. To summarize recommendations:  - he had a DCCV 10/12/17 and has completed 4 weeks of uninterrupted a/c with Eliquis since procedure.  -I spoke with pt over the phone today. He denies any cardiac symptoms. No palpitations, CP or dyspnea. He can be cleared for colonoscopy.  - per pharmacy recommendations, it is ok to hold Eliquis for 1-2 days prior to colonoscopy as required.   Will route this bundled recommendation to requesting provider via Epic fax function and will remove from preop pool. Please call with questions.  Robbie Lis, PA-C 11/25/2017, 4:53 PM

## 2017-12-15 ENCOUNTER — Other Ambulatory Visit: Payer: Self-pay | Admitting: Cardiology

## 2018-01-05 ENCOUNTER — Ambulatory Visit: Payer: Medicare Other | Admitting: Cardiology

## 2018-01-05 ENCOUNTER — Encounter: Payer: Self-pay | Admitting: Cardiology

## 2018-01-05 ENCOUNTER — Other Ambulatory Visit: Payer: Self-pay

## 2018-01-05 VITALS — BP 134/78 | HR 54 | Ht 71.0 in | Wt 290.2 lb

## 2018-01-05 DIAGNOSIS — I10 Essential (primary) hypertension: Secondary | ICD-10-CM

## 2018-01-05 DIAGNOSIS — I4819 Other persistent atrial fibrillation: Secondary | ICD-10-CM

## 2018-01-05 DIAGNOSIS — G4733 Obstructive sleep apnea (adult) (pediatric): Secondary | ICD-10-CM | POA: Diagnosis not present

## 2018-01-05 DIAGNOSIS — I481 Persistent atrial fibrillation: Secondary | ICD-10-CM

## 2018-01-05 NOTE — Progress Notes (Signed)
Electrophysiology Office Note   Date:  01/05/2018   ID:  Ronald Hull, DOB [birth year], MRN [id number]  PCP:  Renford Dills, MD   Primary Electrophysiologist:  Abhinav Mayorquin [name], MD    Chief Complaint  Patient presents with  . Atrial Fibrillation     History of Present Illness: Ronald Hull is a 72 y.o. male who presents today for electrophysiology evaluation.   He presented to his primary physician on 10/02/15 with an elevated and irregular heart rate. Cardioversion was attempted and was unsuccessful with repeat cardioversion successfully to 360 J. He was initially placed on amiodarone and has since been switched to Multaq. He went back into atrial fibrillation and had cardioversion 10/12/17.  Today, denies symptoms of palpitations, chest pain, shortness of breath, orthopnea, PND, lower extremity edema, claudication, dizziness, presyncope, syncope, bleeding, or neurologic sequela. The patient is tolerating medications without difficulties.  Overall he is feeling well.  He is noted no further episodes of atrial fibrillation since his cardioversion.  He is able to do well with his daily activities without issue.  Past Medical History:  Diagnosis Date  . Arthritis   . Complication of anesthesia    BP dropped post colonoscopy  . Hypercholesteremia   . Hypertension   . Persistent atrial fibrillation (HCC)   . Sleep apnea    does use a cpap  . Wears glasses    Past Surgical History:  Procedure Laterality Date  . CARDIOVERSION N/A 11/01/2015   Procedure: CARDIOVERSION;  Surgeon: [name], MD;  Location: [facility] ENDOSCOPY;  Service: Cardiovascular;  Laterality: N/A;  . CARDIOVERSION N/A 11/23/2015   Procedure: CARDIOVERSION;  Surgeon: [name] [name], MD;  Location: [facility] ENDOSCOPY;  Service: Cardiovascular;  Laterality: N/A;  . CARDIOVERSION N/A 10/12/2017   Procedure: CARDIOVERSION;  Surgeon: [name], MD;  Location: [facility] ENDOSCOPY;  Service: Cardiovascular;   Laterality: N/A;  . COLONOSCOPY    . KNEE ARTHROSCOPY  2000  . SHOULDER ARTHROSCOPY WITH ROTATOR CUFF REPAIR AND SUBACROMIAL DECOMPRESSION Right 07/15/2012   Procedure: RIGHT ARTHROSCOPY SHOULDER DECOMPRESSION  SUBACROMIAL PARTIAL ACROMIOPLASTY WITH CORACOACROMIAL RELEASE,  DISTAL CLAVICULECTOMY, resect biceps debride labrium WITH ROTATOR CUFF REPAIR;  Surgeon: [name], MD;  Location: [facility];  Service: Orthopedics;  Laterality: Right;     Current Outpatient Medications  Medication Sig Dispense Refill  . atorvastatin (LIPITOR) 20 MG tablet Take 20 mg by mouth at bedtime.     Marland Kitchen ELIQUIS 5 MG TABS tablet TAKE 1 TABLET(5 MG) BY MOUTH TWICE DAILY 60 tablet 5  . metoprolol tartrate (LOPRESSOR) 25 MG tablet Take 1 tablet (25 mg total) by mouth 2 (two) times daily. 180 tablet 1  . MULTAQ 400 MG tablet TAKE 1 TABLET(400 MG) BY MOUTH TWICE DAILY WITH A MEAL 60 tablet 11  . hydrocortisone cream 1 % Apply 1 application topically daily as needed for itching.    . hydroxypropyl methylcellulose / hypromellose (ISOPTO TEARS / GONIOVISC) 2.5 % ophthalmic solution Place 1 drop into both eyes 4 (four) times daily as needed for dry eyes.     Current Facility-Administered Medications  Medication Dose Route Frequency Provider Last Rate Last Dose  . cefTRIAXone (ROCEPHIN) injection 1 g  1 g Intramuscular Q24H Jonita Albee, MD   1 g at 11/18/12 1032    Allergies:   Lisinopril   Social History:  The patient  reports that he quit smoking about 44 years ago. He has never used smokeless tobacco. He reports  that he drinks alcohol. He reports that he does not use drugs.   Family History:  The patient's family history includes Cancer in his sister; Clotting disorder in his mother; Heart disease in his brother; Heart failure in his father; Hypertension in his sister; Other in his mother.    ROS:  Please see the history of present illness.   Otherwise, review of systems is positive for  changes, easy bruising.   All other systems are reviewed and negative.   PHYSICAL EXAM: VS:  BP 134/78   Pulse (!) 54   Ht 5\' 11"  (1.803 m)   Wt 290 lb 3.2 oz (131.6 kg)   BMI 40.47 kg/m  , BMI Body mass index is 40.47 kg/m. GEN: Well nourished, well developed, in no acute distress  HEENT: normal  Neck: no JVD, carotid bruits, or masses Cardiac: RRR; no murmurs, rubs, or gallops,no edema  Respiratory:  clear to auscultation bilaterally, normal work of breathing GI: soft, nontender, nondistended, + BS MS: no deformity or atrophy  Skin: warm and dry Neuro:  Strength and sensation are intact Psych: euthymic mood, full affect  EKG:  EKG is ordered today. Personal review of the ekg ordered shows this rhythm, rate 54, voltage criteria for LVH, first-degree AV block  Recent Labs: No results found for requested labs within last 8760 hours.    Lipid Panel  No results found for: CHOL, TRIG, HDL, CHOLHDL, VLDL, LDLCALC, LDLDIRECT   Wt Readings from Last 3 Encounters:  01/05/18 290 lb 3.2 oz (131.6 kg)  10/12/17 290 lb (131.5 kg)  10/01/17 294 lb 3.2 oz (133.4 kg)      Other studies Reviewed: Additional studies/ records that were reviewed today include: TTE 09/2015 - Left ventricle: The cavity size was normal. Wall thickness was   increased in a pattern of moderate LVH. Systolic function was   normal. The estimated ejection fraction was in the range of 55%   to 60%. Indeterminant diastolic function. Wall motion was normal;   there were no regional wall motion abnormalities. - Aortic valve: There was no stenosis. There was trivial   regurgitation. - Mitral valve: Mildly calcified annulus. There was mild   regurgitation. - Left atrium: The atrium was mildly dilated. - Right ventricle: The cavity size was normal. Systolic function   was normal. - Right atrium: The atrium was mildly dilated. - Tricuspid valve: Peak RV-RA gradient (S): 26 mm Hg. - Pulmonary arteries: PA peak  pressure: 29 mm Hg (S). - Inferior vena cava: The vessel was normal in size. The   respirophasic diameter changes were in the normal range (= 50%),   consistent with normal central venous pressure.  ASSESSMENT AND PLAN:  1.  Persistent atrial fibrillation: Early on Eliquis and Multitak.  He had a recent cardioversion approximately 2 months ago and he has remained in sinus rhythm.  He feels well without complaints.  No changes.    This patients CHA2DS2-VASc Score and unadjusted Ischemic Stroke Rate (% per year) is equal to 2.2 % stroke rate/year from a score of 2  Above score calculated as 1 point each if present [CHF, HTN, DM, Vascular=MI/PAD/Aortic Plaque, Age if 65-74, or Male] Above score calculated as 2 points each if present [Age > 75, or Stroke/TIA/TE]   2. Hypertension: Mildly elevated today but has been normal in the past.  No changes.  3. OSA: CPAP compliance encouraged   Current medicines are reviewed at length with the patient today.   The patient  does not have concerns regarding his medicines.  The following changes were made today: None  Labs/ tests ordered today include:  Orders Placed This Encounter  Procedures  . EKG 12-Lead     Disposition:   FU with Toniesha Zellner 6 months  Signed, Tarryn Bogdan [name], MD  01/05/2018 2:24 PM     [facility] [address] [place] [address] [phone] (office) [phone] (fax)

## 2018-01-05 NOTE — Patient Instructions (Signed)
Medication Instructions:  Your physician recommends that you continue on your current medications as directed. Please refer to the Current Medication list given to you today.  * If you need a refill on your cardiac medications before your next appointment, please call your pharmacy.   Labwork: None ordered  Testing/Procedures: None ordered  Follow-Up: Your physician wants you to follow-up in: 6 months with Dr. Camnitz.  You will receive a reminder letter in the mail two months in advance. If you don't receive a letter, please call our office to schedule the follow-up appointment.  *Please note that any paperwork needing to be filled out by the provider will need to be addressed at the front desk prior to seeing the provider. Please note that any FMLA, disability or other documents regarding health condition is subject to a $25.00 charge that must be received prior to completion of paperwork in the form of a money order or check.  Thank you for choosing [facility]!!   Sonali Wivell, RN [phone]        

## 2018-01-19 ENCOUNTER — Encounter: Payer: Self-pay | Admitting: Cardiology

## 2018-01-19 ENCOUNTER — Ambulatory Visit: Payer: Medicare Other | Admitting: Cardiology

## 2018-01-19 VITALS — BP 126/72 | HR 79 | Ht 71.0 in | Wt 294.0 lb

## 2018-01-19 DIAGNOSIS — I2584 Coronary atherosclerosis due to calcified coronary lesion: Secondary | ICD-10-CM

## 2018-01-19 DIAGNOSIS — I10 Essential (primary) hypertension: Secondary | ICD-10-CM

## 2018-01-19 DIAGNOSIS — G4733 Obstructive sleep apnea (adult) (pediatric): Secondary | ICD-10-CM

## 2018-01-19 DIAGNOSIS — I2510 Atherosclerotic heart disease of native coronary artery without angina pectoris: Secondary | ICD-10-CM

## 2018-01-19 DIAGNOSIS — I481 Persistent atrial fibrillation: Secondary | ICD-10-CM

## 2018-01-19 DIAGNOSIS — I4819 Other persistent atrial fibrillation: Secondary | ICD-10-CM

## 2018-01-19 NOTE — Progress Notes (Signed)
Electrophysiology Office Note   Date:  01/19/2018   ID:  DMARCUS DECICCO, DOB 01/29/1946, MRN [id number]  PCP:  Renford Dills, MD   Primary Electrophysiologist:  Regan Lemming, MD    No chief complaint on file.    History of Present Illness: CEEJAY KEGLEY is a 72 y.o. male who presents today for electrophysiology evaluation.   He presented to his primary physician on 10/02/15 with an elevated and irregular heart rate. Cardioversion was attempted and was unsuccessful with repeat cardioversion successfully to 360 J. He was initially placed on amiodarone and has since been switched to Multaq. He went back into atrial fibrillation and had cardioversion 10/12/17.  Today, denies symptoms of , chest pain, shortness of breath, orthopnea, PND, lower extremity edema, claudication, dizziness, presyncope, syncope, bleeding, or neurologic sequela. The patient is tolerating medications without difficulties.  He was on a trip to [place] when he went into atrial fibrillation.  His main symptoms are palpitations and fatigue.  He says he feels well and his heart rate is below 100 beats, but has significant symptoms when it is over 100 or less than 50.  He currently feels well now, but his heart rate is well controlled.  Past Medical History:  Diagnosis Date  . Arthritis   . Complication of anesthesia    BP dropped post colonoscopy  . Hypercholesteremia   . Hypertension   . Persistent atrial fibrillation (HCC)   . Sleep apnea    does use a cpap  . Wears glasses    Past Surgical History:  Procedure Laterality Date  . CARDIOVERSION N/A 11/01/2015   Procedure: CARDIOVERSION;  Surgeon: [name], MD;  Location: [facility] ENDOSCOPY;  Service: Cardiovascular;  Laterality: N/A;  . CARDIOVERSION N/A 11/23/2015   Procedure: CARDIOVERSION;  Surgeon: Danyah Guastella [name], MD;  Location: [facility] ENDOSCOPY;  Service: Cardiovascular;  Laterality: N/A;  . CARDIOVERSION N/A 10/12/2017   Procedure:  CARDIOVERSION;  Surgeon: [name], MD;  Location: [facility] ENDOSCOPY;  Service: Cardiovascular;  Laterality: N/A;  . COLONOSCOPY    . KNEE ARTHROSCOPY  2000  . SHOULDER ARTHROSCOPY WITH ROTATOR CUFF REPAIR AND SUBACROMIAL DECOMPRESSION Right 07/15/2012   Procedure: RIGHT ARTHROSCOPY SHOULDER DECOMPRESSION  SUBACROMIAL PARTIAL ACROMIOPLASTY WITH CORACOACROMIAL RELEASE,  DISTAL CLAVICULECTOMY, resect biceps debride labrium WITH ROTATOR CUFF REPAIR;  Surgeon: [name], MD;  Location: [facility];  Service: Orthopedics;  Laterality: Right;     Current Outpatient Medications  Medication Sig Dispense Refill  . atorvastatin (LIPITOR) 20 MG tablet Take 20 mg by mouth at bedtime.     Marland Kitchen ELIQUIS 5 MG TABS tablet TAKE 1 TABLET(5 MG) BY MOUTH TWICE DAILY 60 tablet 5  . metoprolol tartrate (LOPRESSOR) 25 MG tablet Take 1 tablet (25 mg total) by mouth 2 (two) times daily. 180 tablet 1  . MULTAQ 400 MG tablet TAKE 1 TABLET(400 MG) BY MOUTH TWICE DAILY WITH A MEAL 60 tablet 11   Current Facility-Administered Medications  Medication Dose Route Frequency Provider Last Rate Last Dose  . cefTRIAXone (ROCEPHIN) injection 1 g  1 g Intramuscular Q24H Jonita Albee, MD   1 g at 11/18/12 1032    Allergies:   Lisinopril   Social History:  The patient  reports that he quit smoking about 44 years ago. He has never used smokeless tobacco. He reports that he drinks alcohol. He reports that he does not use drugs.   Family History:  The patient's family history includes Cancer in  his sister; Clotting disorder in his mother; Heart disease in his brother; Heart failure in his father; Hypertension in his sister; Other in his mother.    ROS:  Please see the history of present illness.   Otherwise, review of systems is positive for fatigue, palpitations.   All other systems are reviewed and negative.   PHYSICAL EXAM: VS:  BP 126/72   Pulse 79   Ht 5\' 11"  (1.803 m)   Wt 294 lb (133.4 kg)   SpO2 97%    BMI 41.00 kg/m  , BMI Body mass index is 41 kg/m. GEN: Well nourished, well developed, in no acute distress  HEENT: normal  Neck: no JVD, carotid bruits, or masses Cardiac: iRRR; no murmurs, rubs, or gallops,no edema  Respiratory:  clear to auscultation bilaterally, normal work of breathing GI: soft, nontender, nondistended, + BS MS: no deformity or atrophy  Skin: warm and dry Neuro:  Strength and sensation are intact Psych: euthymic mood, full affect  EKG:  EKG is ordered today. Personal review of the ekg ordered shows drill fibrillation, rate 79  Recent Labs: No results found for requested labs within last 8760 hours.    Lipid Panel  No results found for: CHOL, TRIG, HDL, CHOLHDL, VLDL, LDLCALC, LDLDIRECT   Wt Readings from Last 3 Encounters:  01/19/18 294 lb (133.4 kg)  01/05/18 290 lb 3.2 oz (131.6 kg)  10/12/17 290 lb (131.5 kg)      Other studies Reviewed: Additional studies/ records that were reviewed today include: TTE 09/2015 - Left ventricle: The cavity size was normal. Wall thickness was   increased in a pattern of moderate LVH. Systolic function was   normal. The estimated ejection fraction was in the range of 55%   to 60%. Indeterminant diastolic function. Wall motion was normal;   there were no regional wall motion abnormalities. - Aortic valve: There was no stenosis. There was trivial   regurgitation. - Mitral valve: Mildly calcified annulus. There was mild   regurgitation. - Left atrium: The atrium was mildly dilated. - Right ventricle: The cavity size was normal. Systolic function   was normal. - Right atrium: The atrium was mildly dilated. - Tricuspid valve: Peak RV-RA gradient (S): 26 mm Hg. - Pulmonary arteries: PA peak pressure: 29 mm Hg (S). - Inferior vena cava: The vessel was normal in size. The   respirophasic diameter changes were in the normal range (= 50%),   consistent with normal central venous pressure.  ASSESSMENT AND PLAN:  1.   Persistent atrial fibrillation: Currently on Eliquis and Multitak.  Due to his persistence of atrial fibrillation we Aimar Borghi stop Multitak.  Had recent cardioversion but unfortunately went back into atrial fibrillation.  I talked to him about further options including ablation versus dofetilide loading.  Risks and benefits of ablation were discussed and include bleeding, tamponade, heart block, stroke, damage to surrounding organs.  He understands these risks and would like to discuss them with his wife.  He Milka Windholz call us back with an answer.  Hemoglobin 14.8, creatinine 1.1 on 06/11/2017  This patients CHA2DS2-VASc Score and unadjusted Ischemic Stroke Rate (% per year) is equal to 2.2 % stroke rate/year from a score of 2  Above score calculated as 1 point each if present [CHF, HTN, DM, Vascular=MI/PAD/Aortic Plaque, Age if 65-74, or Male] Above score calculated as 2 points each if present [Age > 75, or Stroke/TIA/TE]   2. Hypertension: Well-controlled  3. OSA: CPAP compliance encouraged  4.  Morbid  obesity: Diet and exercise encouraged  Current medicines are reviewed at length with the patient today.   The patient does not have concerns regarding his medicines.  The following changes were made today: Stop Multitak  Labs/ tests ordered today include:  Orders Placed This Encounter  Procedures  . EKG 12-Lead     Disposition:   FU with Maren Wiesen 3 months  Signed, Gisella Alwine [name], MD  01/19/2018 9:09 AM     [facility] [address] [place] [address] [phone] (office) [phone] (fax)

## 2018-01-19 NOTE — Patient Instructions (Addendum)
Medication Instructions:  Your physician has recommended you make the following change in your medication:  1. STOP Multaq  * If you need a refill on your cardiac medications before your next appointment, please call your pharmacy.   Labwork: None ordered *We will only notify you of abnormal results, otherwise continue current treatment plan.  Testing/Procedures: None ordered  Follow-Up: Your physician recommends that you schedule a follow-up appointment in: 3 months with Dr. Elberta Fortis.   Thank you for choosing [facility]!!   Dory Horn, RN [phone]  Any Other Special Instructions Will Be Listed Below (If Applicable). Please call the office if you decide you would like to proceed with ablation

## 2018-01-22 ENCOUNTER — Telehealth: Payer: Self-pay | Admitting: Cardiology

## 2018-01-22 NOTE — Telephone Encounter (Signed)
New Message          Patient called today to get a appointment for surgery scheduled by "Norval Morton. pls call again.

## 2018-01-22 NOTE — Telephone Encounter (Signed)
Returned call to patient. He states that he has decided to move forward with the afib ablation and is ready to schedule the procedure. Made patient aware that Dr. Elberta Fortis' RN is out of the office today and will give him a call next week to get everything set up. Instructed the patient to let us know if his Sx change or worsen. Patient verbalized understanding and thanked me for the call.

## 2018-01-28 ENCOUNTER — Telehealth: Payer: Self-pay | Admitting: *Deleted

## 2018-01-28 DIAGNOSIS — Z01812 Encounter for preprocedural laboratory examination: Secondary | ICD-10-CM

## 2018-01-28 DIAGNOSIS — I4819 Other persistent atrial fibrillation: Secondary | ICD-10-CM

## 2018-01-28 NOTE — Telephone Encounter (Signed)
Pt informed that ablation scheduled for 10/25. He is aware office will call to arrange pre CT testing. Advised that I would follow up with him next week to go over instructions in more detail.  He is agreeable to this plan.

## 2018-02-01 ENCOUNTER — Encounter: Payer: Self-pay | Admitting: *Deleted

## 2018-02-01 NOTE — Telephone Encounter (Signed)
Pt aware I was sending CT instructions and procedure instruction letters via MyChart. He and I agreed to speak sometime this week to verbally review instructions and understanding. Pt agreeable to plan.

## 2018-02-04 NOTE — Telephone Encounter (Signed)
Cardiac CT & Ablation instructions reviewed w/ patient. Pre procedure labs scheduled for tomorrow, 10/11. Post procedure follow up appt made. Patient verbalized understanding and agreeable to plan.

## 2018-02-05 ENCOUNTER — Other Ambulatory Visit: Payer: Medicare Other | Admitting: *Deleted

## 2018-02-05 DIAGNOSIS — I4819 Other persistent atrial fibrillation: Secondary | ICD-10-CM

## 2018-02-05 DIAGNOSIS — Z01812 Encounter for preprocedural laboratory examination: Secondary | ICD-10-CM

## 2018-02-05 LAB — CBC
Hematocrit: 44.1 % (ref 37.5–51.0)
Hemoglobin: 15.1 g/dL (ref 13.0–17.7)
MCH: 31.3 pg (ref 26.6–33.0)
MCHC: 34.2 g/dL (ref 31.5–35.7)
MCV: 92 fL (ref 79–97)
Platelets: 173 10*3/uL (ref 150–450)
RBC: 4.82 x10E6/uL (ref 4.14–5.80)
RDW: 15.1 % (ref 12.3–15.4)
WBC: 7 10*3/uL (ref 3.4–10.8)

## 2018-02-05 LAB — BASIC METABOLIC PANEL
BUN/Creatinine Ratio: 16 (ref 10–24)
BUN: 18 mg/dL (ref 8–27)
CO2: 20 mmol/L (ref 20–29)
Calcium: 8.9 mg/dL (ref 8.6–10.2)
Chloride: 102 mmol/L (ref 96–106)
Creatinine, Ser: 1.13 mg/dL (ref 0.76–1.27)
GFR calc Af Amer: 75 mL/min/{1.73_m2} (ref >59)
GFR calc non Af Amer: 65 mL/min/{1.73_m2} (ref >59)
Glucose: 100 mg/dL — ABNORMAL HIGH (ref 65–99)
Potassium: 4.8 mmol/L (ref 3.5–5.2)
Sodium: 138 mmol/L (ref 134–144)

## 2018-02-17 ENCOUNTER — Ambulatory Visit (HOSPITAL_COMMUNITY)
Admission: RE | Admit: 2018-02-17 | Discharge: 2018-02-17 | Disposition: A | Discharge status: Home / Self Care | Payer: Medicare Other | Source: Ambulatory Visit | Attending: Cardiology | Admitting: Cardiology

## 2018-02-17 DIAGNOSIS — I4819 Other persistent atrial fibrillation: Secondary | ICD-10-CM | POA: Diagnosis not present

## 2018-02-17 DIAGNOSIS — I4891 Unspecified atrial fibrillation: Secondary | ICD-10-CM | POA: Diagnosis not present

## 2018-02-17 DIAGNOSIS — I080 Rheumatic disorders of both mitral and aortic valves: Secondary | ICD-10-CM | POA: Insufficient documentation

## 2018-02-17 DIAGNOSIS — I2510 Atherosclerotic heart disease of native coronary artery without angina pectoris: Secondary | ICD-10-CM | POA: Diagnosis not present

## 2018-02-17 DIAGNOSIS — I288 Other diseases of pulmonary vessels: Secondary | ICD-10-CM | POA: Diagnosis not present

## 2018-02-17 DIAGNOSIS — R911 Solitary pulmonary nodule: Secondary | ICD-10-CM | POA: Diagnosis not present

## 2018-02-17 MED ORDER — IOPAMIDOL (ISOVUE-370) INJECTION 76%
100.0000 mL | Freq: Once | INTRAVENOUS | Status: AC | PRN
  Administered 2018-02-17: 100 mL via INTRAVENOUS

## 2018-02-17 NOTE — Addendum Note (Signed)
Addended by: Lars Masson on: 02/17/2018 06:34 PM   Modules accepted: Orders

## 2018-02-18 ENCOUNTER — Telehealth: Payer: Self-pay | Admitting: Cardiology

## 2018-02-18 NOTE — Telephone Encounter (Signed)
  Patient wants to speak with you regarding his IV for tomorrow

## 2018-02-18 NOTE — Telephone Encounter (Signed)
Informed pt of abn ct result and need for catheterization.  Pt is scheduled to see Ronald Hull Monday to discuss and schedule cath. Pt is agreeable to plan.

## 2018-02-19 ENCOUNTER — Ambulatory Visit (HOSPITAL_COMMUNITY): Admission: RE | Admit: 2018-02-19 | Payer: Medicare Other | Source: Ambulatory Visit | Admitting: Cardiology

## 2018-02-19 ENCOUNTER — Encounter (HOSPITAL_COMMUNITY): Admission: RE | Payer: Self-pay | Source: Ambulatory Visit

## 2018-02-19 SURGERY — ATRIAL FIBRILLATION ABLATION
Anesthesia: General

## 2018-02-21 NOTE — H&P (View-Only) (Signed)
Cardiology Office Note:    Date:  02/22/2018   ID:  Ronald Hull, DOB [birth year], MRN [id number]  PCP:  Renford Dills, MD  Cardiologist:  No primary care provider on file.   Electrophysiologist:  Will [name], MD   Referring MD: Renford Dills, MD   Chief Complaint  Patient presents with  . Coronary Artery Disease    To arrange cardiac catheterization    History of Present Illness:    Ronald Hull is a 72 y.o. male with persistent atrial fibrillation, hypertension, hyperlipidemia, sleep apnea.  He has undergone several cardioversions with return of atrial fibrillation.  He was previously on Amiodarone.  Most recently he was on Multaq.  He was last seen by Dr. Elberta Fortis 01/19/18 after he went back into atrial fibrillation on a trip to [place].  He underwent cardioversion, but went back into atrial fibrillation.  He was taken off of Multaq and workup was started for PVI ablation.   His CT demonstrated a significantly elevated calcium score and FFR analysis suggested hemodynamically significant stenosis in the mid LAD and proximal LCx arteries.  Dr. Elberta Fortis recommended proceeding with a Cardiac Catheterization.  Ronald Hull returns to discuss proceeding with a Cardiac Catheterization.  He is here alone.  He does note increased heart rate at night.  He has awoken out of breath with some chest pressure related to this.  He has noted worsening shortness of breath with exertion even before he went back into atrial fibrillation.  He denies exertional chest pain or syncope.  He has chronic lower extremity swelling without significant change.  Prior CV studies:   The following studies were reviewed today:  Cardiac CTA w/ Calcium Score 02/17/18 IMPRESSION: 1. There is normal pulmonary vein drainage into the left atrium. 2. The left atrial appendage is fairly small with no evidence for a thrombus. 3. The esophagus runs in the left atrial midline and is not in the proximity to  any of the pulmonary veins. 4. The esophagus runs to the left from the left atrial midline and is in the proximity to the left common pulmonary vein trunk. 5. Calcium score is 3318 that correlates with 96 percentile for age/sex. We will try to obtain CT FFR, if unable to process sec to motion, a functional nuclear study with D SPECT camera is recommended. 6. Mild calcifications of the aortic valve leaflet extending into the LVOT. 7. Posterior mitral annular calcifications  FFR  1. Left Main:  No significant stenosis. 2. LAD: Proximal: 0.94, distal 0.77. 3. LCX: 0.5. 4. RCA: No significant stenosis.  IMPRESSION: 1. CT FFR analysis showed hemodynamically significant stenosis in the mid LAD and proximal LCX arteries. A cardiac catheterization is Recommended.  Echo 10/24/2015 Moderate LVH, EF 55-60, normal wall motion, trivial AI, mild MR, mild LAE, normal RVSF, mild RAE, PASP 29  Past Medical History:  Diagnosis Date  . Arthritis   . Complication of anesthesia    BP dropped post colonoscopy  . Hypercholesteremia   . Hypertension   . Persistent atrial fibrillation   . Sleep apnea    does use a cpap  . Wears glasses    Surgical Hx: The patient  has a past surgical history that includes Colonoscopy; Knee arthroscopy (2000); Shoulder arthroscopy with rotator cuff repair and subacromial decompression (Right, 07/15/2012); Cardioversion (N/A, 11/01/2015); Cardioversion (N/A, 11/23/2015); and Cardioversion (N/A, 10/12/2017).   Current Medications: Current Meds  Medication Sig  . ELIQUIS 5 MG TABS tablet TAKE 1 TABLET(5 MG) BY  MOUTH TWICE DAILY  . [DISCONTINUED] atorvastatin (LIPITOR) 20 MG tablet Take 20 mg by mouth at bedtime.   . [DISCONTINUED] metoprolol tartrate (LOPRESSOR) 25 MG tablet Take 1 tablet (25 mg total) by mouth 2 (two) times daily.   Current Facility-Administered Medications for the 02/22/18 encounter (Office Visit) with Tereso Newcomer T, PA-C  Medication  .  cefTRIAXone (ROCEPHIN) injection 1 g     Allergies:   Lisinopril   Social History   Tobacco Use  . Smoking status: Former Smoker    Last attempt to quit: 07/09/1973    Years since quitting: 44.6  . Smokeless tobacco: Never Used  Substance Use Topics  . Alcohol use: Yes    Comment: beers daily  . Drug use: No     Family Hx: The patient's family history includes Cancer in his sister; Clotting disorder in his mother; Heart disease in his brother; Heart failure in his father; Hypertension in his sister; Other in his mother.  ROS:   Please see the history of present illness.    Review of Systems  Cardiovascular: Positive for dyspnea on exertion and irregular heartbeat.  Genitourinary: Positive for incomplete emptying.   All other systems reviewed and are negative.   EKGs/Labs/Other Test Reviewed:    EKG:  EKG is  ordered today.  The ekg ordered today demonstrates atrial fibrillation, heart rate 87  Recent Labs: 02/05/2018: BUN 18; Creatinine, Ser 1.13; Hemoglobin 15.1; Platelets 173; Potassium 4.8; Sodium 138   Recent Lipid Panel No results found for: CHOL, TRIG, HDL, CHOLHDL, LDLCALC, LDLDIRECT  Physical Exam:    VS:  BP (!) 144/80   Pulse 87   Ht 5\' 11"  (1.803 m)   Wt 287 lb 12.8 oz (130.5 kg)   SpO2 96%   BMI 40.14 kg/m     Wt Readings from Last 3 Encounters:  02/22/18 287 lb 12.8 oz (130.5 kg)  01/19/18 294 lb (133.4 kg)  01/05/18 290 lb 3.2 oz (131.6 kg)     Physical Exam  Constitutional: He is oriented to person, place, and time. He appears well-developed and well-nourished. No distress.  HENT:  Head: Normocephalic and atraumatic.  Eyes: No scleral icterus.  Neck: Neck supple. No JVD present. No thyromegaly present.  Cardiovascular: Normal rate. An irregularly irregular rhythm present.  No murmur heard. Pulmonary/Chest: Effort normal. He has no wheezes. He has no rales.  Abdominal: Soft.  Musculoskeletal: He exhibits edema (1+ bilat LE edema (R>L)).    Lymphadenopathy:    He has no cervical adenopathy.  Neurological: He is alert and oriented to person, place, and time.  Skin: Skin is warm and dry.  Psychiatric: He has a normal mood and affect.    ASSESSMENT & PLAN:    Coronary artery disease involving native coronary artery of native heart without angina pectoris Recent CT angiogram demonstrated a very high calcium score as well as hemodynamically significant stenosis in the mid LAD and proximal LCx by FFR.  Therefore, cardiac catheterization has been recommended.  Aside from shortness of breath and chest discomfort related to elevated heart rates at nighttime, he denies exertional angina.  Risks and benefits of cardiac catheterization have been discussed with the patient.  These include bleeding, infection, kidney damage, stroke, heart attack, death.  The patient understands these risks and is willing to proceed.   -Hold apixaban x48 hours prior to cardiac catheterization  -Arrange cardiac catheterization later this week with Dr. Excell Seltzer  -He will be given ASA the day of his heart  cath.  -Continue beta-blocker   -He will likely follow up with me and Dr. Excell Seltzer for general Cardiology  Persistent atrial fibrillation CHADS2-VASc=3 (HTN, age x 1, CAD).  Heart rate controlled.  However, he seems to have higher heart rates at night which is causing him shortness of breath.  His exam does not suggest evidence of volume overload.  I have recommended increasing his metoprolol tartrate to 50 mg twice daily.  As noted, Apixaban will be held x48 hours prior to cardiac catheterization.  Essential hypertension Blood pressure somewhat above target.  Adjust beta-blocker as noted.  Hyperlipidemia, unspecified hyperlipidemia type Given his significant calcium score and potentially hemodynamically significant coronary artery disease, I have recommended increasing his atorvastatin to 40 mg daily.  We will need to arrange follow up fasting Lipids and LFTs at  his next office visit.    Dispo:  Return in about 2 weeks (around 03/08/2018) for Post Procedure Follow Up, w/ Tereso Newcomer, PA-C.   Medication Adjustments/Labs and Tests Ordered: Current medicines are reviewed at length with the patient today.  Concerns regarding medicines are outlined above.  Tests Ordered: Orders Placed This Encounter  Procedures  . Basic Metabolic Panel (BMET)  . CBC  . EKG 12-Lead   Medication Changes: Meds ordered this encounter  Medications  . atorvastatin (LIPITOR) 40 MG tablet    Sig: Take 1 tablet (40 mg total) by mouth daily.    Dispense:  90 tablet    Refill:  3    DOSE INCREASE  . metoprolol tartrate (LOPRESSOR) 50 MG tablet    Sig: Take 1 tablet (50 mg total) by mouth 2 (two) times daily.    Dispense:  180 tablet    Refill:  3    DOSE INCREASE    Signed, Tereso Newcomer, PA-C  02/22/2018 1:17 PM    [facility] [address] [place], [place], [place]  [address] Phone: [phone]; Fax: [phone]

## 2018-02-21 NOTE — Progress Notes (Signed)
Cardiology Office Note:    Date:  02/22/2018   ID:  Ronald Hull, DOB 11/18/1945, MRN [id number]  PCP:  Polite, Ronald, MD  Cardiologist:  No primary care provider on file.   Electrophysiologist:  Will Martin Camnitz, MD   Referring MD: Polite, Ronald, MD   Chief Complaint  Patient presents with  . Coronary Artery Disease    To arrange cardiac catheterization    History of Present Illness:    Ronald Hull is a 72 y.o. male with persistent atrial fibrillation, hypertension, hyperlipidemia, sleep apnea.  He has undergone several cardioversions with return of atrial fibrillation.  He was previously on Amiodarone.  Most recently he was on Multaq.  He was last seen by Dr. Camnitz 01/19/18 after he went back into atrial fibrillation on a trip to [place].  He underwent cardioversion, but went back into atrial fibrillation.  He was taken off of Multaq and workup was started for PVI ablation.   His CT demonstrated a significantly elevated calcium score and FFR analysis suggested hemodynamically significant stenosis in the mid LAD and proximal LCx arteries.  Dr. Camnitz recommended proceeding with a Cardiac Catheterization.  Ronald Hull returns to discuss proceeding with a Cardiac Catheterization.  He is here alone.  He does note increased heart rate at night.  He has awoken out of breath with some chest pressure related to this.  He has noted worsening shortness of breath with exertion even before he went back into atrial fibrillation.  He denies exertional chest pain or syncope.  He has chronic lower extremity swelling without significant change.  Prior CV studies:   The following studies were reviewed today:  Cardiac CTA w/ Calcium Score 02/17/18 IMPRESSION: 1. There is normal pulmonary vein drainage into the left atrium. 2. The left atrial appendage is fairly small with no evidence for a thrombus. 3. The esophagus runs in the left atrial midline and is not in the proximity to  any of the pulmonary veins. 4. The esophagus runs to the left from the left atrial midline and is in the proximity to the left common pulmonary vein trunk. 5. Calcium score is 3318 that correlates with 96 percentile for age/sex. We will try to obtain CT FFR, if unable to process sec to motion, a functional nuclear study with D SPECT camera is recommended. 6. Mild calcifications of the aortic valve leaflet extending into the LVOT. 7. Posterior mitral annular calcifications  FFR  1. Left Main:  No significant stenosis. 2. LAD: Proximal: 0.94, distal 0.77. 3. LCX: 0.5. 4. RCA: No significant stenosis.  IMPRESSION: 1. CT FFR analysis showed hemodynamically significant stenosis in the mid LAD and proximal LCX arteries. A cardiac catheterization is Recommended.  Echo 10/24/2015 Moderate LVH, EF 55-60, normal wall motion, trivial AI, mild MR, mild LAE, normal RVSF, mild RAE, PASP 29  Past Medical History:  Diagnosis Date  . Arthritis   . Complication of anesthesia    BP dropped post colonoscopy  . Hypercholesteremia   . Hypertension   . Persistent atrial fibrillation   . Sleep apnea    does use a cpap  . Wears glasses    Surgical Hx: The patient  has a past surgical history that includes Colonoscopy; Knee arthroscopy (2000); Shoulder arthroscopy with rotator cuff repair and subacromial decompression (Right, 07/15/2012); Cardioversion (N/A, 11/01/2015); Cardioversion (N/A, 11/23/2015); and Cardioversion (N/A, 10/12/2017).   Current Medications: Current Meds  Medication Sig  . ELIQUIS 5 MG TABS tablet TAKE 1 TABLET(5 MG) BY   MOUTH TWICE DAILY  . [DISCONTINUED] atorvastatin (LIPITOR) 20 MG tablet Take 20 mg by mouth at bedtime.   . [DISCONTINUED] metoprolol tartrate (LOPRESSOR) 25 MG tablet Take 1 tablet (25 mg total) by mouth 2 (two) times daily.   Current Facility-Administered Medications for the 02/22/18 encounter (Office Visit) with Weaver, Scott T, PA-C  Medication  .  cefTRIAXone (ROCEPHIN) injection 1 g     Allergies:   Lisinopril   Social History   Tobacco Use  . Smoking status: Former Smoker    Last attempt to quit: 07/09/1973    Years since quitting: 44.6  . Smokeless tobacco: Never Used  Substance Use Topics  . Alcohol use: Yes    Comment: beers daily  . Drug use: No     Family Hx: The patient's family history includes Cancer in his sister; Clotting disorder in his mother; Heart disease in his brother; Heart failure in his father; Hypertension in his sister; Other in his mother.  ROS:   Please see the history of present illness.    Review of Systems  Cardiovascular: Positive for dyspnea on exertion and irregular heartbeat.  Genitourinary: Positive for incomplete emptying.   All other systems reviewed and are negative.   EKGs/Labs/Other Test Reviewed:    EKG:  EKG is  ordered today.  The ekg ordered today demonstrates atrial fibrillation, heart rate 87  Recent Labs: 02/05/2018: BUN 18; Creatinine, Ser 1.13; Hemoglobin 15.1; Platelets 173; Potassium 4.8; Sodium 138   Recent Lipid Panel No results found for: CHOL, TRIG, HDL, CHOLHDL, LDLCALC, LDLDIRECT  Physical Exam:    VS:  BP (!) 144/80   Pulse 87   Ht 5' 11" (1.803 m)   Wt 287 lb 12.8 oz (130.5 kg)   SpO2 96%   BMI 40.14 kg/m     Wt Readings from Last 3 Encounters:  02/22/18 287 lb 12.8 oz (130.5 kg)  01/19/18 294 lb (133.4 kg)  01/05/18 290 lb 3.2 oz (131.6 kg)     Physical Exam  Constitutional: He is oriented to person, place, and time. He appears well-developed and well-nourished. No distress.  HENT:  Head: Normocephalic and atraumatic.  Eyes: No scleral icterus.  Neck: Neck supple. No JVD present. No thyromegaly present.  Cardiovascular: Normal rate. An irregularly irregular rhythm present.  No murmur heard. Pulmonary/Chest: Effort normal. He has no wheezes. He has no rales.  Abdominal: Soft.  Musculoskeletal: He exhibits edema (1+ bilat LE edema (R>L)).    Lymphadenopathy:    He has no cervical adenopathy.  Neurological: He is alert and oriented to person, place, and time.  Skin: Skin is warm and dry.  Psychiatric: He has a normal mood and affect.    ASSESSMENT & PLAN:    Coronary artery disease involving native coronary artery of native heart without angina pectoris Recent CT angiogram demonstrated a very high calcium score as well as hemodynamically significant stenosis in the mid LAD and proximal LCx by FFR.  Therefore, cardiac catheterization has been recommended.  Aside from shortness of breath and chest discomfort related to elevated heart rates at nighttime, he denies exertional angina.  Risks and benefits of cardiac catheterization have been discussed with the patient.  These include bleeding, infection, kidney damage, stroke, heart attack, death.  The patient understands these risks and is willing to proceed.   -Hold apixaban x48 hours prior to cardiac catheterization  -Arrange cardiac catheterization later this week with Dr. Cooper  -He will be given ASA the day of his heart   cath.  -Continue beta-blocker   -He will likely follow up with me and Dr. Cooper for general Cardiology  Persistent atrial fibrillation CHADS2-VASc=3 (HTN, age x 1, CAD).  Heart rate controlled.  However, he seems to have higher heart rates at night which is causing him shortness of breath.  His exam does not suggest evidence of volume overload.  I have recommended increasing his metoprolol tartrate to 50 mg twice daily.  As noted, Apixaban will be held x48 hours prior to cardiac catheterization.  Essential hypertension Blood pressure somewhat above target.  Adjust beta-blocker as noted.  Hyperlipidemia, unspecified hyperlipidemia type Given his significant calcium score and potentially hemodynamically significant coronary artery disease, I have recommended increasing his atorvastatin to 40 mg daily.  We will need to arrange follow up fasting Lipids and LFTs at  his next office visit.    Dispo:  Return in about 2 weeks (around 03/08/2018) for Post Procedure Follow Up, w/ Scott Weaver, PA-C.   Medication Adjustments/Labs and Tests Ordered: Current medicines are reviewed at length with the patient today.  Concerns regarding medicines are outlined above.  Tests Ordered: Orders Placed This Encounter  Procedures  . Basic Metabolic Panel (BMET)  . CBC  . EKG 12-Lead   Medication Changes: Meds ordered this encounter  Medications  . atorvastatin (LIPITOR) 40 MG tablet    Sig: Take 1 tablet (40 mg total) by mouth daily.    Dispense:  90 tablet    Refill:  3    DOSE INCREASE  . metoprolol tartrate (LOPRESSOR) 50 MG tablet    Sig: Take 1 tablet (50 mg total) by mouth 2 (two) times daily.    Dispense:  180 tablet    Refill:  3    DOSE INCREASE    Signed, Scott Weaver, PA-C  02/22/2018 1:17 PM    [facility] [address], [place],   [address] Phone: [phone]; Fax: [phone]    

## 2018-02-22 ENCOUNTER — Encounter: Payer: Self-pay | Admitting: Physician Assistant

## 2018-02-22 ENCOUNTER — Telehealth: Payer: Self-pay | Admitting: *Deleted

## 2018-02-22 ENCOUNTER — Ambulatory Visit: Payer: Medicare Other | Admitting: Physician Assistant

## 2018-02-22 VITALS — BP 144/80 | HR 87 | Ht 71.0 in | Wt 287.8 lb

## 2018-02-22 DIAGNOSIS — I4819 Other persistent atrial fibrillation: Secondary | ICD-10-CM | POA: Diagnosis not present

## 2018-02-22 DIAGNOSIS — E785 Hyperlipidemia, unspecified: Secondary | ICD-10-CM

## 2018-02-22 DIAGNOSIS — I2510 Atherosclerotic heart disease of native coronary artery without angina pectoris: Secondary | ICD-10-CM

## 2018-02-22 DIAGNOSIS — I10 Essential (primary) hypertension: Secondary | ICD-10-CM | POA: Diagnosis not present

## 2018-02-22 LAB — CBC
Hematocrit: 45.9 % (ref 37.5–51.0)
Hemoglobin: 15.4 g/dL (ref 13.0–17.7)
MCH: 30.7 pg (ref 26.6–33.0)
MCHC: 33.6 g/dL (ref 31.5–35.7)
MCV: 91 fL (ref 79–97)
Platelets: 207 10*3/uL (ref 150–450)
RBC: 5.02 x10E6/uL (ref 4.14–5.80)
RDW: 14.1 % (ref 12.3–15.4)
WBC: 7.2 10*3/uL (ref 3.4–10.8)

## 2018-02-22 LAB — BASIC METABOLIC PANEL
BUN/Creatinine Ratio: 11 (ref 10–24)
BUN: 12 mg/dL (ref 8–27)
CO2: 21 mmol/L (ref 20–29)
Calcium: 9 mg/dL (ref 8.6–10.2)
Chloride: 100 mmol/L (ref 96–106)
Creatinine, Ser: 1.05 mg/dL (ref 0.76–1.27)
GFR calc Af Amer: 82 mL/min/{1.73_m2} (ref >59)
GFR calc non Af Amer: 71 mL/min/{1.73_m2} (ref >59)
Glucose: 87 mg/dL (ref 65–99)
Potassium: 4.5 mmol/L (ref 3.5–5.2)
Sodium: 137 mmol/L (ref 134–144)

## 2018-02-22 MED ORDER — METOPROLOL TARTRATE 50 MG PO TABS: 50.0000 mg | ORAL_TABLET | Freq: Two times a day (BID) | ORAL | 3 refills | Status: DC

## 2018-02-22 MED ORDER — ATORVASTATIN CALCIUM 40 MG PO TABS: 40.0000 mg | ORAL_TABLET | Freq: Every day | ORAL | 3 refills | Status: DC

## 2018-02-22 NOTE — Telephone Encounter (Signed)
-----   Message from Beatrice Lecher, [place] sent at 02/22/2018  5:12 PM EDT ----- Renal function, potassium, hemoglobin normal.  Recommendations:  - Continue current medications and follow up as planned.  Tereso Newcomer, PA-C    02/22/2018 5:11 PM

## 2018-02-22 NOTE — Telephone Encounter (Signed)
Pt has been notified of lab results by phone with verbal understanding. Pt thanked me for the call.   

## 2018-02-22 NOTE — Patient Instructions (Addendum)
Medication Instructions:  1. INCREASE LIPITOR TO 40 MG DAILY; NEW RX HAS BEEN SENT IN   2. INCREASE METOPROLOL TARTRATE 50 MG TWICE DAILY; NEW RX HAS BEEN SENT IN If you need a refill on your cardiac medications before your next appointment, please call your pharmacy.   Lab work: TODAY BMET, CBC  If you have labs (blood work) drawn today and your tests are completely normal, you will receive your results only by: Marland Kitchen MyChart Message (if you have MyChart) OR . A paper copy in the mail If you have any lab test that is abnormal or we need to change your treatment, we will call you to review the results.  Testing/Procedures: Your physician has requested that you have a cardiac catheterization. Cardiac catheterization is used to diagnose and/or treat various heart conditions. Doctors may recommend this procedure for a number of different reasons. The most common reason is to evaluate chest pain. Chest pain can be a symptom of coronary artery disease (CAD), and cardiac catheterization can show whether plaque is narrowing or blocking your heart's arteries. This procedure is also used to evaluate the valves, as well as measure the blood flow and oxygen levels in different parts of your heart. For further information please visit [email or web address] Please follow instruction sheet, as given.    Follow-Up: At [facility], you and your health needs are our priority.  As part of our continuing mission to provide you with exceptional heart care, we have created designated Provider Care Teams.  These Care Teams include your primary Cardiologist (physician) and Advanced Practice Providers (APPs -  Physician Assistants and Nurse Practitioners) who all work together to provide you with the care you need, when you need it. You will need a follow up appointment in:  2 weeks.  Please call our office 2 months in advance to schedule this appointment.  You may see No primary care provider on file. SCOTT WEAVER, PAC   or one of the following Advanced Practice Providers on your designated Care Team: Tereso Newcomer, [place] 03/12/18 @ 11:45  Vin Bhagat, PA-C . Berton Bon, NP  Any Other Special Instructions Will Be Listed Below (If Applicable).     [facility] CARDIOVASCULAR DIVISION [facility] ST OFFICE [address] [place] [address] Dept: [phone] Loc: [phone]  Ronald Hull  02/22/2018  You are scheduled for a Cardiac Catheterization on Wednesday, October 30 with Dr. Tonny Bollman.  1. Please arrive at the [facility] (Main Entrance A) at [facility]: [address] [place], [place] [address] at 12:30 PM (This time is two hours before your procedure to ensure your preparation). Free valet parking service is available.   Special note: Every effort is made to have your procedure done on time. Please understand that emergencies sometimes delay scheduled procedures.  2. Diet: Do not eat solid foods after midnight.  The patient may have clear liquids until 5am upon the day of the procedure.  3. Labs: You will need to have blood drawn on Monday, October 28 at [facility] at [facility]. [address], [place]  Open: 7:30am - 5pm    Phone: [phone]. You do not need to be fasting.   Contrast Allergy: No  4. Medication instructions in preparation for your procedure: Stop taking Eliquis (Apixiban) on Monday, October 28.   On the morning of your procedure, take your Aspirin 81 MG and any morning medicines NOT listed above.  You may use sips of water.  5. Plan for one night stay--bring personal belongings. 6. Bring a current list of your medications and current insurance cards. 7. You MUST have a responsible person to drive you home. 8. Someone MUST be with you the first 24 hours after you arrive home or your discharge will be delayed. 9. Please wear clothes that are easy to get on and off and wear  slip-on shoes.  Thank you for allowing [place] to care for you!   -- [facility]

## 2018-02-23 ENCOUNTER — Telehealth: Payer: Self-pay | Admitting: *Deleted

## 2018-02-23 NOTE — Telephone Encounter (Signed)
Pt contacted pre-catheterization scheduled at [facility] for: Wednesday February 24, 2018 2:30 PM Verified arrival time and place: [facility] Main Entrance A at: 12:30 PM  No solid food after midnight prior to cath, clear liquids until 5 AM day of procedure. Contrast allergy: no Verified no diabetes medications.  Hold: Eliquis-last dose AM 02/22/18-hold until post procedure.  Except hold medication AM meds can be  taken pre-cath with sip of water including: ASA 81 mg  Confirmed patient has responsible person to drive home post procedure and for 24 hours after you arrive home: yes

## 2018-02-24 ENCOUNTER — Other Ambulatory Visit: Payer: Self-pay

## 2018-02-24 ENCOUNTER — Encounter (HOSPITAL_COMMUNITY)
Admission: RE | Disposition: A | Discharge status: Home / Self Care | Payer: Self-pay | Source: Ambulatory Visit | Attending: Cardiovascular Disease

## 2018-02-24 ENCOUNTER — Ambulatory Visit (HOSPITAL_COMMUNITY)
Admission: RE | Admit: 2018-02-24 | Discharge: 2018-02-25 | Disposition: A | Discharge status: Home / Self Care | Payer: Medicare Other | Source: Ambulatory Visit | Attending: Cardiovascular Disease | Admitting: Cardiovascular Disease

## 2018-02-24 DIAGNOSIS — G4730 Sleep apnea, unspecified: Secondary | ICD-10-CM | POA: Diagnosis not present

## 2018-02-24 DIAGNOSIS — E785 Hyperlipidemia, unspecified: Secondary | ICD-10-CM | POA: Insufficient documentation

## 2018-02-24 DIAGNOSIS — I4819 Other persistent atrial fibrillation: Secondary | ICD-10-CM | POA: Insufficient documentation

## 2018-02-24 DIAGNOSIS — Z87891 Personal history of nicotine dependence: Secondary | ICD-10-CM | POA: Diagnosis not present

## 2018-02-24 DIAGNOSIS — M1990 Unspecified osteoarthritis, unspecified site: Secondary | ICD-10-CM | POA: Insufficient documentation

## 2018-02-24 DIAGNOSIS — I25119 Atherosclerotic heart disease of native coronary artery with unspecified angina pectoris: Secondary | ICD-10-CM | POA: Diagnosis not present

## 2018-02-24 DIAGNOSIS — I2584 Coronary atherosclerosis due to calcified coronary lesion: Secondary | ICD-10-CM | POA: Diagnosis not present

## 2018-02-24 DIAGNOSIS — I2510 Atherosclerotic heart disease of native coronary artery without angina pectoris: Secondary | ICD-10-CM

## 2018-02-24 DIAGNOSIS — Z9889 Other specified postprocedural states: Secondary | ICD-10-CM | POA: Insufficient documentation

## 2018-02-24 DIAGNOSIS — R0602 Shortness of breath: Secondary | ICD-10-CM | POA: Diagnosis not present

## 2018-02-24 DIAGNOSIS — I10 Essential (primary) hypertension: Secondary | ICD-10-CM | POA: Diagnosis not present

## 2018-02-24 DIAGNOSIS — Z79899 Other long term (current) drug therapy: Secondary | ICD-10-CM | POA: Insufficient documentation

## 2018-02-24 DIAGNOSIS — Z7901 Long term (current) use of anticoagulants: Secondary | ICD-10-CM | POA: Diagnosis not present

## 2018-02-24 DIAGNOSIS — R931 Abnormal findings on diagnostic imaging of heart and coronary circulation: Secondary | ICD-10-CM | POA: Insufficient documentation

## 2018-02-24 DIAGNOSIS — Z888 Allergy status to other drugs, medicaments and biological substances status: Secondary | ICD-10-CM | POA: Insufficient documentation

## 2018-02-24 DIAGNOSIS — Z8249 Family history of ischemic heart disease and other diseases of the circulatory system: Secondary | ICD-10-CM | POA: Diagnosis not present

## 2018-02-24 DIAGNOSIS — I219 Acute myocardial infarction, unspecified: Secondary | ICD-10-CM | POA: Insufficient documentation

## 2018-02-24 DIAGNOSIS — Z955 Presence of coronary angioplasty implant and graft: Secondary | ICD-10-CM

## 2018-02-24 HISTORY — PX: LEFT HEART CATH AND CORONARY ANGIOGRAPHY: CATH118249

## 2018-02-24 HISTORY — DX: Atherosclerotic heart disease of native coronary artery without angina pectoris: I25.10

## 2018-02-24 HISTORY — PX: CORONARY STENT INTERVENTION: CATH118234

## 2018-02-24 HISTORY — PX: INTRAVASCULAR PRESSURE WIRE/FFR STUDY: CATH118243

## 2018-02-24 LAB — POCT ACTIVATED CLOTTING TIME
Activated Clotting Time: 263 seconds
Activated Clotting Time: 252 seconds
Activated Clotting Time: 307 seconds

## 2018-02-24 SURGERY — LEFT HEART CATH AND CORONARY ANGIOGRAPHY
Anesthesia: LOCAL

## 2018-02-24 MED ORDER — IOHEXOL 350 MG/ML SOLN
INTRAVENOUS | Status: DC | PRN
  Administered 2018-02-24: 135 mL via INTRACARDIAC

## 2018-02-24 MED ORDER — ANGIOPLASTY BOOK
Freq: Once | Status: AC
  Administered 2018-02-24: 21:00:00
  Filled 2018-02-24: qty 1

## 2018-02-24 MED ORDER — LIDOCAINE HCL (PF) 1 % IJ SOLN
INTRAMUSCULAR | Status: DC | PRN
  Administered 2018-02-24: 2 mL

## 2018-02-24 MED ORDER — MIDAZOLAM HCL 2 MG/2ML IJ SOLN
INTRAMUSCULAR | Status: AC
  Filled 2018-02-24: qty 2

## 2018-02-24 MED ORDER — VERAPAMIL HCL 2.5 MG/ML IV SOLN
INTRAVENOUS | Status: AC
  Filled 2018-02-24: qty 2

## 2018-02-24 MED ORDER — HYDRALAZINE HCL 20 MG/ML IJ SOLN: 5.0000 mg | INTRAMUSCULAR | Status: AC | PRN

## 2018-02-24 MED ORDER — SODIUM CHLORIDE 0.9 % IV SOLN: 250.0000 mL | INTRAVENOUS | Status: DC | PRN

## 2018-02-24 MED ORDER — METOPROLOL TARTRATE 50 MG PO TABS
50.0000 mg | ORAL_TABLET | Freq: Two times a day (BID) | ORAL | Status: DC
  Administered 2018-02-24 – 2018-02-25 (×2): 50 mg via ORAL
  Filled 2018-02-24 (×2): qty 1
  Filled 2018-02-24: qty 2

## 2018-02-24 MED ORDER — CLOPIDOGREL BISULFATE 300 MG PO TABS
ORAL_TABLET | ORAL | Status: DC | PRN
  Administered 2018-02-24: 600 mg via ORAL

## 2018-02-24 MED ORDER — SODIUM CHLORIDE 0.9 % IV SOLN
INTRAVENOUS | Status: AC
  Administered 2018-02-24: 18:00:00 via INTRAVENOUS

## 2018-02-24 MED ORDER — FENTANYL CITRATE (PF) 100 MCG/2ML IJ SOLN
INTRAMUSCULAR | Status: DC | PRN
  Administered 2018-02-24: 50 ug via INTRAVENOUS
  Administered 2018-02-24: 25 ug via INTRAVENOUS

## 2018-02-24 MED ORDER — ASPIRIN 81 MG PO CHEW: 81.0000 mg | CHEWABLE_TABLET | ORAL | Status: DC

## 2018-02-24 MED ORDER — ACETAMINOPHEN 325 MG PO TABS: 650.0000 mg | ORAL_TABLET | ORAL | Status: DC | PRN

## 2018-02-24 MED ORDER — APIXABAN 5 MG PO TABS
5.0000 mg | ORAL_TABLET | Freq: Two times a day (BID) | ORAL | Status: DC
  Administered 2018-02-25: 5 mg via ORAL
  Filled 2018-02-24: qty 1

## 2018-02-24 MED ORDER — MORPHINE SULFATE (PF) 2 MG/ML IV SOLN: 2.0000 mg | INTRAVENOUS | Status: DC | PRN

## 2018-02-24 MED ORDER — HEPARIN (PORCINE) IN NACL 1000-0.9 UT/500ML-% IV SOLN
INTRAVENOUS | Status: AC
  Filled 2018-02-24: qty 1000

## 2018-02-24 MED ORDER — NITROGLYCERIN 1 MG/10 ML FOR IR/CATH LAB
INTRA_ARTERIAL | Status: DC | PRN
  Administered 2018-02-24 (×2): 200 ug via INTRACORONARY

## 2018-02-24 MED ORDER — MIDAZOLAM HCL 2 MG/2ML IJ SOLN
INTRAMUSCULAR | Status: DC | PRN
  Administered 2018-02-24: 1 mg via INTRAVENOUS
  Administered 2018-02-24: 2 mg via INTRAVENOUS

## 2018-02-24 MED ORDER — SODIUM CHLORIDE 0.9 % WEIGHT BASED INFUSION
3.0000 mL/kg/h | INTRAVENOUS | Status: DC
  Administered 2018-02-24: 3 mL/kg/h via INTRAVENOUS

## 2018-02-24 MED ORDER — HEPARIN SODIUM (PORCINE) 1000 UNIT/ML IJ SOLN
INTRAMUSCULAR | Status: DC | PRN
  Administered 2018-02-24: 5000 [IU] via INTRAVENOUS
  Administered 2018-02-24: 6500 [IU] via INTRAVENOUS
  Administered 2018-02-24: 4000 [IU] via INTRAVENOUS

## 2018-02-24 MED ORDER — SODIUM CHLORIDE 0.9% FLUSH
3.0000 mL | Freq: Two times a day (BID) | INTRAVENOUS | Status: DC
  Administered 2018-02-24: 21:00:00 3 mL via INTRAVENOUS

## 2018-02-24 MED ORDER — CLOPIDOGREL BISULFATE 75 MG PO TABS
75.0000 mg | ORAL_TABLET | Freq: Every day | ORAL | Status: DC
  Administered 2018-02-25: 75 mg via ORAL
  Filled 2018-02-24: qty 1

## 2018-02-24 MED ORDER — HEPARIN (PORCINE) IN NACL 1000-0.9 UT/500ML-% IV SOLN
INTRAVENOUS | Status: DC | PRN
  Administered 2018-02-24 (×2): 500 mL

## 2018-02-24 MED ORDER — ONDANSETRON HCL 4 MG/2ML IJ SOLN: 4.0000 mg | Freq: Four times a day (QID) | INTRAMUSCULAR | Status: DC | PRN

## 2018-02-24 MED ORDER — ATORVASTATIN CALCIUM 40 MG PO TABS: 40.0000 mg | ORAL_TABLET | Freq: Every day | ORAL | Status: DC

## 2018-02-24 MED ORDER — SODIUM CHLORIDE 0.9 % WEIGHT BASED INFUSION: 1.0000 mL/kg/h | INTRAVENOUS | Status: DC

## 2018-02-24 MED ORDER — FENTANYL CITRATE (PF) 100 MCG/2ML IJ SOLN
INTRAMUSCULAR | Status: AC
  Filled 2018-02-24: qty 2

## 2018-02-24 MED ORDER — OXYCODONE HCL 5 MG PO TABS: 5.0000 mg | ORAL_TABLET | ORAL | Status: DC | PRN

## 2018-02-24 MED ORDER — LABETALOL HCL 5 MG/ML IV SOLN: 10.0000 mg | INTRAVENOUS | Status: AC | PRN

## 2018-02-24 MED ORDER — LIDOCAINE HCL (PF) 1 % IJ SOLN
INTRAMUSCULAR | Status: AC
  Filled 2018-02-24: qty 30

## 2018-02-24 MED ORDER — SODIUM CHLORIDE 0.9% FLUSH: 3.0000 mL | INTRAVENOUS | Status: DC | PRN

## 2018-02-24 MED ORDER — VERAPAMIL HCL 2.5 MG/ML IV SOLN
INTRAVENOUS | Status: DC | PRN
  Administered 2018-02-24: 10 mL via INTRA_ARTERIAL

## 2018-02-24 MED ORDER — ASPIRIN 81 MG PO CHEW
81.0000 mg | CHEWABLE_TABLET | Freq: Once | ORAL | Status: AC
  Administered 2018-02-24: 81 mg via ORAL
  Filled 2018-02-24: qty 1

## 2018-02-24 SURGICAL SUPPLY — 24 items
BALLN SAPPHIRE 2.5X15 (BALLOONS) ×2
BALLN SAPPHIRE ~~LOC~~ 3.0X12 (BALLOONS) ×2 IMPLANT
BALLN SAPPHIRE ~~LOC~~ 3.75X12 (BALLOONS) ×2 IMPLANT
BALLN ~~LOC~~ EMERGE MR 4.5X8 (BALLOONS) ×2
BALLOON SAPPHIRE 2.5X15 (BALLOONS) ×1 IMPLANT
BALLOON ~~LOC~~ EMERGE MR 4.5X8 (BALLOONS) ×1 IMPLANT
CATH 5FR JL3.5 JR4 ANG PIG MP (CATHETERS) ×2 IMPLANT
CATH LAUNCHER 6FR AL1 (CATHETERS) ×1 IMPLANT
CATH LAUNCHER 6FR EBU3.5 (CATHETERS) ×2 IMPLANT
CATHETER LAUNCHER 6FR AL1 (CATHETERS) ×2
DEVICE RAD COMP TR BAND LRG (VASCULAR PRODUCTS) ×2 IMPLANT
GLIDESHEATH SLEND SS 6F .021 (SHEATH) ×2 IMPLANT
GUIDEWIRE INQWIRE 1.5J.035X260 (WIRE) ×1 IMPLANT
GUIDEWIRE PRESSURE COMET II (WIRE) ×2 IMPLANT
INQWIRE 1.5J .035X260CM (WIRE) ×2
KIT ENCORE 26 ADVANTAGE (KITS) ×2 IMPLANT
KIT ESSENTIALS PG (KITS) ×2 IMPLANT
KIT HEART LEFT (KITS) ×2 IMPLANT
PACK CARDIAC CATHETERIZATION (CUSTOM PROCEDURE TRAY) ×2 IMPLANT
STENT SIERRA 3.50 X 15 MM (Permanent Stent) ×2 IMPLANT
STENT SIERRA 4.00 X 15 MM (Permanent Stent) ×2 IMPLANT
TRANSDUCER W/STOPCOCK (MISCELLANEOUS) ×2 IMPLANT
TUBING CIL FLEX 10 FLL-RA (TUBING) ×2 IMPLANT
WIRE COUGAR XT STRL 190CM (WIRE) ×2 IMPLANT

## 2018-02-24 NOTE — Interval H&P Note (Signed)
History and Physical Interval Note:  02/24/2018 3:05 PM  Ronald Hull  has presented today for surgery, with the diagnosis of abnormal ct  The various methods of treatment have been discussed with the patient and family. After consideration of risks, benefits and other options for treatment, the patient has consented to  Procedure(s): LEFT HEART CATH AND CORONARY ANGIOGRAPHY (N/A) as a surgical intervention .  The patient's history has been reviewed, patient examined, no change in status, stable for surgery.  I have reviewed the patient's chart and labs.  Questions were answered to the patient's satisfaction.     Tonny Bollman

## 2018-02-25 ENCOUNTER — Encounter (HOSPITAL_COMMUNITY): Payer: Self-pay | Admitting: Cardiovascular Disease

## 2018-02-25 DIAGNOSIS — I2584 Coronary atherosclerosis due to calcified coronary lesion: Secondary | ICD-10-CM | POA: Diagnosis not present

## 2018-02-25 DIAGNOSIS — Z9889 Other specified postprocedural states: Secondary | ICD-10-CM | POA: Diagnosis not present

## 2018-02-25 DIAGNOSIS — G4730 Sleep apnea, unspecified: Secondary | ICD-10-CM | POA: Diagnosis not present

## 2018-02-25 DIAGNOSIS — R0602 Shortness of breath: Secondary | ICD-10-CM | POA: Diagnosis not present

## 2018-02-25 DIAGNOSIS — I25119 Atherosclerotic heart disease of native coronary artery with unspecified angina pectoris: Secondary | ICD-10-CM | POA: Diagnosis not present

## 2018-02-25 DIAGNOSIS — R931 Abnormal findings on diagnostic imaging of heart and coronary circulation: Secondary | ICD-10-CM | POA: Diagnosis not present

## 2018-02-25 DIAGNOSIS — M1990 Unspecified osteoarthritis, unspecified site: Secondary | ICD-10-CM | POA: Diagnosis not present

## 2018-02-25 DIAGNOSIS — I25110 Atherosclerotic heart disease of native coronary artery with unstable angina pectoris: Secondary | ICD-10-CM

## 2018-02-25 DIAGNOSIS — I4819 Other persistent atrial fibrillation: Secondary | ICD-10-CM | POA: Diagnosis not present

## 2018-02-25 DIAGNOSIS — Z888 Allergy status to other drugs, medicaments and biological substances status: Secondary | ICD-10-CM | POA: Diagnosis not present

## 2018-02-25 DIAGNOSIS — Z79899 Other long term (current) drug therapy: Secondary | ICD-10-CM | POA: Diagnosis not present

## 2018-02-25 DIAGNOSIS — I219 Acute myocardial infarction, unspecified: Secondary | ICD-10-CM | POA: Diagnosis not present

## 2018-02-25 DIAGNOSIS — I10 Essential (primary) hypertension: Secondary | ICD-10-CM | POA: Diagnosis not present

## 2018-02-25 DIAGNOSIS — Z8249 Family history of ischemic heart disease and other diseases of the circulatory system: Secondary | ICD-10-CM | POA: Diagnosis not present

## 2018-02-25 DIAGNOSIS — Z87891 Personal history of nicotine dependence: Secondary | ICD-10-CM | POA: Diagnosis not present

## 2018-02-25 DIAGNOSIS — E785 Hyperlipidemia, unspecified: Secondary | ICD-10-CM | POA: Diagnosis not present

## 2018-02-25 DIAGNOSIS — Z7901 Long term (current) use of anticoagulants: Secondary | ICD-10-CM | POA: Diagnosis not present

## 2018-02-25 LAB — BASIC METABOLIC PANEL
Anion gap: 3 — ABNORMAL LOW (ref 5–15)
BUN: 12 mg/dL (ref 8–23)
CO2: 26 mmol/L (ref 22–32)
Calcium: 8.2 mg/dL — ABNORMAL LOW (ref 8.9–10.3)
Chloride: 107 mmol/L (ref 98–111)
Creatinine, Ser: 1.06 mg/dL (ref 0.61–1.24)
GFR calc Af Amer: >60 mL/min (ref >60)
GFR calc non Af Amer: >60 mL/min (ref >60)
Glucose, Bld: 89 mg/dL (ref 70–99)
Potassium: 4.6 mmol/L (ref 3.5–5.1)
Sodium: 136 mmol/L (ref 135–145)

## 2018-02-25 LAB — CBC
HCT: 42.5 % (ref 39.0–52.0)
Hemoglobin: 14 g/dL (ref 13.0–17.0)
MCH: 31 pg (ref 26.0–34.0)
MCHC: 32.9 g/dL (ref 30.0–36.0)
MCV: 94.2 fL (ref 80.0–100.0)
Platelets: 170 10*3/uL (ref 150–400)
RBC: 4.51 MIL/uL (ref 4.22–5.81)
RDW: 14.3 % (ref 11.5–15.5)
WBC: 8 10*3/uL (ref 4.0–10.5)
nRBC: 0 % (ref 0.0–0.2)

## 2018-02-25 MED ORDER — NITROGLYCERIN 0.4 MG SL SUBL: 0.4000 mg | SUBLINGUAL_TABLET | SUBLINGUAL | 2 refills | Status: DC | PRN

## 2018-02-25 MED ORDER — ASPIRIN EC 81 MG PO TBEC
81.0000 mg | DELAYED_RELEASE_TABLET | Freq: Every day | ORAL | Status: DC
  Administered 2018-02-25: 09:00:00 81 mg via ORAL
  Filled 2018-02-25: qty 1

## 2018-02-25 MED ORDER — CLOPIDOGREL BISULFATE 75 MG PO TABS: 75.0000 mg | ORAL_TABLET | Freq: Every day | ORAL | 0 refills | Status: DC

## 2018-02-25 MED ORDER — ASPIRIN 81 MG PO CHEW: 81.0000 mg | CHEWABLE_TABLET | Freq: Once | ORAL | 0 refills | Status: AC

## 2018-02-25 MED FILL — CLOPIDOGREL 75 MG TABLET: 75 | 30 days supply | Qty: 30 | Fill #0

## 2018-02-25 MED FILL — ASPIRIN LOW DOSE 81 MG CHEW: 81 | 1 days supply | Qty: 1 | Fill #0

## 2018-02-25 MED FILL — NITROGLYCERIN 0.4 MG TAB SL: 0.4 | 25 days supply | Qty: 25 | Fill #0 | Status: TO

## 2018-02-25 NOTE — Progress Notes (Signed)
TR BAND REMOVAL  LOCATION:    right radial  DEFLATED PER PROTOCOL:    Yes.    TIME BAND OFF / DRESSING APPLIED:    2200   SITE UPON ARRIVAL:    Level 0  SITE AFTER BAND REMOVAL:    Level 0  CIRCULATION SENSATION AND MOVEMENT:    Within Normal Limits   Yes.    COMMENTS:   Pt.tolerated procedure well 

## 2018-02-25 NOTE — Progress Notes (Signed)
CARDIAC REHAB PHASE I   PRE:  Rate/Rhythm: 95 afib    BP: sitting 132/76    SaO2:   MODE:  Ambulation: 400 ft   POST:  Rate/Rhythm: 135 afib    BP: sitting 170/92     SaO2:   HR up with activity. Pt sts this is normal when he hasn't had meds. He watches his rate/rhythm with his watch. Ed completed. Understands importance of Plavix. Will refer to [place] CRPII. Needs diet and ex change, sts he is working on it. [id number]   Harriet Masson CES, ACSM 02/25/2018 8:43 AM

## 2018-02-25 NOTE — Discharge Instructions (Addendum)

## 2018-02-25 NOTE — Research (Signed)
XIENCE 28 Informed Consent   Subject Name: Ronald Hull  Subject met inclusion and exclusion criteria.  The informed consent form,  study requirements and expectations were reviewed with the subject and questions and concerns were addressed prior to the signing of the consent form.  The subject verbalized understanding of the trial requirements.  The subject agreed to participate in the XIENCE 28 trial and signed the informed consent.  The informed consent was obtained prior to performance of any protocol-specific procedures for the subject.  A copy of the signed informed consent was given to the subject and a copy was placed in the subject's medical record.  [name] 02/25/2018, 09:00

## 2018-02-25 NOTE — Discharge Summary (Addendum)
Discharge Summary    Patient ID: Ronald Hull,  MRN: [id number], DOB/AGE: 1945/07/28 72 y.o.  Admit date: 02/24/2018 Discharge date: 02/25/2018  Primary Care Provider: Renford Dills Primary Cardiologist: Alben Spittle (Dr. Excell Seltzer)  Discharge Diagnoses    Active Problems:   Coronary artery disease involving native coronary artery with angina pectoris [facility])   Coronary artery disease involving native coronary artery of native heart with angina pectoris (HCC)   Allergies Allergies  Allergen Reactions  . Lisinopril Cough    Diagnostic Studies/Procedures    Cath: 02/24/18   Mid RCA lesion is 80% stenosed.  Prox RCA lesion is 30% stenosed.  Ost Cx to Prox Cx lesion is 95% stenosed.  Prox LAD lesion is 50% stenosed.  Prox LAD to Mid LAD lesion is 50% stenosed.  A drug-eluting stent was successfully placed using a STENT SIERRA 3.50 X 15 MM.  Post intervention, there is a 0% residual stenosis.  A drug-eluting stent was successfully placed using a STENT SIERRA 4.00 X 15 MM.  Post intervention, there is a 0% residual stenosis.   1.  Mild to moderate diffuse LAD stenosis with borderline DFR of 0.89 2.  Severe proximal circumflex stenosis treated successfully with a 3.5 x 15 mm Xience Sierra DES 3.  Severe mid RCA stenosis with heavy calcification, treated successfully with noncompliant balloon angioplasty and stenting with a 4.0 x 15 mm Xience Sierra DES 4.  Multivessel DFR analysis of the RCA and LAD as detailed above  Recommend to resume Apixaban, at currently prescribed dose and frequency, on 02-25-2018.  Recommend concurrent antiplatelet therapy of Aspirin 81mg  daily for 1 month and Clopidogrel 75mg  daily for 6 months.   Alternatively patient could be considered for the Xience 30 day trial where he would continue ASA 81 mg daily and take clopidogrel x 30 days. Research staff will review options with him tomorrow prior to discharge.  _____________   History of  Present Illness     72 y.o. male with persistent atrial fibrillation, hypertension, hyperlipidemia, sleep apnea.  He has undergone several cardioversions with return of atrial fibrillation.  He was previously on Amiodarone.  Most recently he was on Multaq.  He was last seen by Dr. Elberta Fortis 01/19/18 after he went back into atrial fibrillation on a trip to [place].  He underwent cardioversion, but went back into atrial fibrillation.  He was taken off of Multaq and workup was started for PVI ablation.   His CT demonstrated a significantly elevated calcium score and FFR analysis suggested hemodynamically significant stenosis in the mid LAD and proximal LCx arteries.  Dr. Elberta Fortis recommended proceeding with a Cardiac Catheterization.  [name] returned to the office to discuss proceeding with a Cardiac Catheterization 02/22/18 with Tereso Newcomer. He did note increased heart rate at night.  He had awoken out of breath with some chest pressure related to this.  He had noted worsening shortness of breath with exertion even before he went back into atrial fibrillation.  He denied exertional chest pain or syncope.  He has chronic lower extremity swelling without significant change. Given his recent CT scan and symptoms, he was set up for outpatient cardiac cath.   Hospital Course     Underwent cardiac cath noted above with successful PCI/DES x1 to the mRCA, and PCI/DES x1 pLCx. He agreed to participate in the Xience study, and planned for triple therapy for one month with Eliquis, ASA and plavix. Then plan to stop plavix at that time. No complications  noted overnight. Worked well with cardiac rehab without recurrent chest pain. He was continued on his other home medications without further adjustments.   General: Well developed, well nourished, male appearing in no acute distress. Head: Normocephalic, atraumatic.  Neck: Supple without bruits, JVD. Lungs:  Resp regular and unlabored, CTA. Heart: RRR, S1, S2,  no murmur; no rub. Abdomen: Soft, non-tender, non-distended with normoactive bowel sounds. No hepatomegaly. No rebound/guarding. No obvious abdominal masses. Extremities: No clubbing, cyanosis, edema. Distal pedal pulses are 2+ bilaterally. R radial cath site stable without bruising or hematoma Neuro: Alert and oriented X 3. Moves all extremities spontaneously. Psych: Normal affect.  Ronald Hull was seen by Dr. Clifton James and determined stable for discharge home. Follow up in the office has been arranged. Medications are listed below.   _____________  Discharge Vitals Blood pressure 130/74, pulse 91, temperature (!) 97.1 F (36.2 C), resp. rate (!) 25, height 5\' 11"  (1.803 m), weight 129 kg, SpO2 96 %.  Filed Weights   02/24/18 1244 02/25/18 0619  Weight: 128.4 kg 129 kg    Labs & Radiologic Studies    CBC Recent Labs    02/22/18 0951 02/25/18 0603  WBC 7.2 8.0  HGB 15.4 14.0  HCT 45.9 42.5  MCV 91 94.2  PLT 207 170   Basic Metabolic Panel Recent Labs    09/81/19 0951 02/25/18 0603  NA 137 136  K 4.5 4.6  CL 100 107  CO2 21 26  GLUCOSE 87 89  BUN 12 12  CREATININE 1.05 1.06  CALCIUM 9.0 8.2*   Liver Function Tests No results for input(s): AST, ALT, ALKPHOS, BILITOT, PROT, ALBUMIN in the last 72 hours. No results for input(s): LIPASE, AMYLASE in the last 72 hours. Cardiac Enzymes No results for input(s): CKTOTAL, CKMB, CKMBINDEX, TROPONINI in the last 72 hours. BNP Invalid input(s): POCBNP D-Dimer No results for input(s): DDIMER in the last 72 hours. Hemoglobin A1C No results for input(s): HGBA1C in the last 72 hours. Fasting Lipid Panel No results for input(s): CHOL, HDL, LDLCALC, TRIG, CHOLHDL, LDLDIRECT in the last 72 hours. Thyroid Function Tests No results for input(s): TSH, T4TOTAL, T3FREE, THYROIDAB in the last 72 hours.  Invalid input(s): FREET3 _____________  Ct Cardiac Morph/pulm Vein W/cm&w/o Ca Score  Addendum Date: 02/18/2018   ADDENDUM  REPORT: 02/18/2018 10:49 ADDENDUM: OVER-READ INTERPRETATION  CT CHEST The following report is an over-read performed by radiologist Dr. Arliss Journey [facility] on 03/01/2013. This over-read does not include interpretation of cardiac or coronary anatomy or pathology. The cardiac CTA interpretation by the cardiologist is attached. COMPARISON: Chest radiograph 11/25/2012. FINDINGS: Vascular: Normal aortic caliber. No central pulmonary embolism, on this non-dedicated study. Pulmonary artery enlargement with the right pulmonary artery measuring 3.2 cm on image 9/12. Mediastinum/Nodes: No imaged thoracic adenopathy. Lungs/Pleura: No pleural fluid.  Mild centrilobular emphysema. Nodularity along the right minor fissure of 7 mm on image 10/13 likely represents a subpleural lymph node when correlated with sagittal reformat. Bibasilar subsegmental atelectasis or scar. 3 mm left lower lobe pulmonary nodule on image 43/13. Upper Abdomen: Normal imaged portions of the liver, spleen, stomach. Musculoskeletal: No acute osseous abnormality. IMPRESSION: 1.  No acute findings in the imaged extracardiac chest. 2. 3 mm left lower lobe pulmonary nodule. No follow-up needed if patient is low-risk. Non-contrast chest CT can be considered in 12 months if patient is high-risk. This recommendation follows the consensus statement: Guidelines for Management of Incidental Pulmonary Nodules Detected on CT Images: From the  Fleischner Society 2017; Radiology 2017; 284:228-243. 3. Pulmonary artery enlargement suggests pulmonary arterial hypertension. Electronically Signed   By: Jeronimo Greaves M.D.   On: 02/18/2018 10:49   Result Date: 02/18/2018 CLINICAL DATA:  72 year old male with persistent atrial fibrillation scheduled for an ablation. EXAM: Cardiac CT/CTA TECHNIQUE: The patient was scanned on a Siemens Somatom scanner. FINDINGS: A 120 kV prospective scan was triggered in the descending thoracic aorta at 111 HU's. Gantry rotation  speed was 280 msecs and collimation was .9 mm. No beta blockade and no NTG was given. The 3D data set was reconstructed in 5% intervals of the 60-80 % of the R-R cycle. Diastolic phases were analyzed on a dedicated work station using MPR, MIP and VRT modes. The patient received 80 cc of contrast. There is normal pulmonary vein drainage into the left atrium (2 on the right and 2 on the left) with ostial measurements as follows: RUPV: 24.6 x 21.8 mm RLPV: 22.2 x 18.7 mm Left common pulmonary vein trunk: 29.4 x 25.3 mm LUPV: 16.2 x 13.0 mm LLPV: 9.3 x 8.3 mm The left atrial appendage is fairly small with no evidence for a thrombus. The esophagus runs to the left from the left atrial midline and is in the proximity to the left common pulmonary vein trunk. Aorta:  Normal caliber.  No dissection, minimal calcifications. Aortic Valve: Trileaflet. Mild calcifications of the aortic valve leaflet extending into the LVOT. Coronary Arteries: Normal coronary origin. Right dominance. The study was performed without use of NTG and insufficient for plaque evaluation. However, calcium score is 3318 that correlates with 96 percentile for age/sex. IMPRESSION: 1. There is normal pulmonary vein drainage into the left atrium. 2. The left atrial appendage is fairly small with no evidence for a thrombus. 3. The esophagus runs in the left atrial midline and is not in the proximity to any of the pulmonary veins. 4. The esophagus runs to the left from the left atrial midline and is in the proximity to the left common pulmonary vein trunk. 5. Calcium score is 3318 that correlates with 96 percentile for age/sex. We will try to obtain CT FFR, if unable to process sec to motion, a functional nuclear study with D SPECT camera is recommended. 6. Mild calcifications of the aortic valve leaflet extending into the LVOT. 7. Posterior mitral annular calcifications. Electronically Signed: By: Tobias Alexander On: 02/17/2018 18:29   Ct Coronary  Fractional Flow Reserve Data Prep  Result Date: 02/18/2018 EXAM: CT FFR ANALYSIS CLINICAL DATA:  72 year old male with PAF and high calcium score. FINDINGS: FFRct analysis was performed on the original cardiac CT angiogram dataset. Diagrammatic representation of the FFRct analysis is provided in a separate PDF document in PACS. This dictation was created using the PDF document and an interactive 3D model of the results. 3D model is not available in the EMR/PACS. Normal FFR range is >0.80. 1. Left Main:  No significant stenosis. 2. LAD: Proximal: 0.94, distal 0.77. 3. LCX: 0.5. 4. RCA: No significant stenosis. IMPRESSION: 1. CT FFR analysis showed hemodynamically significant stenosis in the mid LAD and proximal LCX arteries. A cardiac catheterization is recommended. Electronically Signed   By: Tobias Alexander   On: 02/18/2018 08:12   Disposition   Pt is being discharged home today in good condition.  Follow-up Plans & Appointments    Follow-up Information    Beatrice Lecher, PA-C Follow up on 03/12/2018.   Specialties:  Cardiology, Physician Assistant Why:  at 11:45am for your  follow up appt.  Contact information: [address] [place] [address] [phone]          Discharge Instructions    Amb Referral to Cardiac Rehabilitation   Complete by:  As directed    Diagnosis:   Coronary Stents PTCA     Diet - low sodium heart healthy   Complete by:  As directed    Discharge instructions   Complete by:  As directed    Radial Site Care Refer to this sheet in the next few weeks. These instructions provide you with information on caring for yourself after your procedure. Your caregiver may also give you more specific instructions. Your treatment has been planned according to current medical practices, but problems sometimes occur. Call your caregiver if you have any problems or questions after your procedure. HOME CARE INSTRUCTIONS You may shower the day after the  procedure.Remove the bandage (dressing) and gently wash the site with plain soap and water.Gently pat the site dry.  Do not apply powder or lotion to the site.  Do not submerge the affected site in water for 3 to 5 days.  Inspect the site at least twice daily.  Do not flex or bend the affected arm for 24 hours.  No lifting over 5 pounds (2.3 kg) for 5 days after your procedure.  Do not drive home if you are discharged the same day of the procedure. Have someone else drive you.  You may drive 24 hours after the procedure unless otherwise instructed by your caregiver.  What to expect: Any bruising will usually fade within 1 to 2 weeks.  Blood that collects in the tissue (hematoma) may be painful to the touch. It should usually decrease in size and tenderness within 1 to 2 weeks.  SEEK IMMEDIATE MEDICAL CARE IF: You have unusual pain at the radial site.  You have redness, warmth, swelling, or pain at the radial site.  You have drainage (other than a small amount of blood on the dressing).  You have chills.  You have a fever or persistent symptoms for more than 72 hours.  You have a fever and your symptoms suddenly get worse.  Your arm becomes pale, cool, tingly, or numb.  You have heavy bleeding from the site. Hold pressure on the site.   PLEASE DO NOT MISS ANY DOSES OF YOUR PLAVIX!!!!! Also keep a log of you blood pressures and bring back to your follow up appt. Please call the office with any questions.   Patients taking blood thinners should generally stay away from medicines like ibuprofen, Advil, Motrin, naproxen, and Aleve due to risk of stomach bleeding. You may take Tylenol as directed or talk to your primary doctor about alternatives.   Increase activity slowly   Complete by:  As directed        Discharge Medications     Medication List    TAKE these medications   aspirin 81 MG chewable tablet Chew 1 tablet (81 mg total) by mouth once for 1 dose.   atorvastatin 40 MG  tablet Commonly known as:  LIPITOR Take 1 tablet (40 mg total) by mouth daily.   clopidogrel 75 MG tablet Commonly known as:  PLAVIX Take 1 tablet (75 mg total) by mouth daily with breakfast.   ELIQUIS 5 MG Tabs tablet Generic drug:  apixaban TAKE 1 TABLET(5 MG) BY MOUTH TWICE DAILY   metoprolol tartrate 50 MG tablet Commonly known as:  LOPRESSOR Take 1 tablet (50  mg total) by mouth 2 (two) times daily.   nitroGLYCERIN 0.4 MG SL tablet Commonly known as:  NITROSTAT Place 1 tablet (0.4 mg total) under the tongue every 5 (five) minutes as needed.        Acute coronary syndrome (MI, NSTEMI, STEMI, etc) this admission?: No.     Outstanding Labs/Studies   N/a   Duration of Discharge Encounter   Greater than 30 minutes including physician time.  Signed, Davron Panciera NP-C 02/25/2018, 9:02 AM   I have personally seen and examined this patient with Laverda Page, NP. I agree with the assessment and plan as outlined above. He is doing well post PCI of the RCA and Circumflex with placement of drug eluting stents. He is on ASA and Plavix. We will resume Eliquis today. Will discuss Xience 28 trial. If he does not elect to be enrolled, will stop ASA at one month. If he elects to be enrolled in the trial, will stop ASA or Plavix at one month. Discharge home today.   Verne Carrow 02/25/2018 9:54 AM

## 2018-03-01 ENCOUNTER — Telehealth (HOSPITAL_COMMUNITY): Payer: Self-pay

## 2018-03-01 NOTE — Telephone Encounter (Signed)
Attempted to call patient in regards to Cardiac Rehab - LM on VM 

## 2018-03-01 NOTE — Telephone Encounter (Signed)
Pt insurance is active and benefits verified through [employer]. Co-pay $0.00, DED $0.00/$0.00 met, out of pocket $5,900.00/$549.10 met, co-insurance 20%. No pre-authorization. Julia/[employer], 03/01/18 @ 11:17AM, REF# [id number]

## 2018-03-05 ENCOUNTER — Encounter: Payer: Self-pay | Admitting: Cardiovascular Disease

## 2018-03-05 NOTE — Progress Notes (Signed)
Circumflex Lesion  XIENCE 28  Lesion Complexity []  A []  B1 [x]  B2 [] C   Thrombus Present  []  Yes [x]  No      Bifurcation Lesion  []  Yes [x]  No       If yes, indicate Medina Classification []  111 []  110 []  101 []  011 []  100 []  010 []  001   Lesion Length  ___12__mm      Reference Vessel Diameter (Visual Estimation) __3.5___mm      Final In-Stent % Diameter Stenosis __0__ %      Residual dissection is not NHLBI grade >= type B []  Yes [x]  No        RCA Lesion XIENCE 28  Lesion Complexity []  A []  B1 [x]  B2 [] C   Thrombus Present  []  Yes [x]  No      Bifurcation Lesion  []  Yes [x]  No       If yes, indicate Medina Classification []  111 []  110 []  101 []  011 []  100 []  010 []  001   Lesion Length  __12___mm      Reference Vessel Diameter (Visual Estimation) __4.5___mm      Final In-Stent % Diameter Stenosis __0__ %      Residual dissection is not NHLBI grade >= type B []  Yes [x]  No        Tonny Bollman 03/05/2018 3:45 PM

## 2018-03-08 ENCOUNTER — Telehealth (HOSPITAL_COMMUNITY): Payer: Self-pay

## 2018-03-08 NOTE — Telephone Encounter (Signed)
Pt returned phone call in regards to Cardiac Rehab - Scheduled orientation on 05/06/18 at 8:00am. Pt will attend the 11:15am exc class. Mailed packet.

## 2018-03-08 NOTE — Telephone Encounter (Signed)
2nd attempt to contact patient in regards to Cardiac Rehab - lm on vm. Sending letter. °

## 2018-03-12 ENCOUNTER — Encounter: Payer: Self-pay | Admitting: Physician Assistant

## 2018-03-12 ENCOUNTER — Ambulatory Visit: Payer: Medicare Other | Admitting: Physician Assistant

## 2018-03-12 VITALS — BP 118/80 | HR 86 | Ht 71.0 in | Wt 280.8 lb

## 2018-03-12 DIAGNOSIS — E785 Hyperlipidemia, unspecified: Secondary | ICD-10-CM

## 2018-03-12 DIAGNOSIS — I2510 Atherosclerotic heart disease of native coronary artery without angina pectoris: Secondary | ICD-10-CM

## 2018-03-12 DIAGNOSIS — I4819 Other persistent atrial fibrillation: Secondary | ICD-10-CM

## 2018-03-12 DIAGNOSIS — E7800 Pure hypercholesterolemia, unspecified: Secondary | ICD-10-CM

## 2018-03-12 DIAGNOSIS — I10 Essential (primary) hypertension: Secondary | ICD-10-CM

## 2018-03-12 HISTORY — DX: Hyperlipidemia, unspecified: E78.5

## 2018-03-12 HISTORY — DX: Essential (primary) hypertension: I10

## 2018-03-12 NOTE — Patient Instructions (Signed)
Medication Instructions:  Your physician recommends that you continue on your current medications as directed. Please refer to the Current Medication list given to you today.  If you need a refill on your cardiac medications before your next appointment, please call your pharmacy.   Lab work: Labs to be done same day as you see Dr. Elberta Fortis: Fasting Lipids, LFTs If you have labs (blood work) drawn today and your tests are completely normal, you will receive your results only by: Marland Kitchen MyChart Message (if you have MyChart) OR . A paper copy in the mail If you have any lab test that is abnormal or we need to change your treatment, we will call you to review the results.  Testing/Procedures: NONE  Follow-Up: At [facility], you and your health needs are our priority.  As part of our continuing mission to provide you with exceptional heart care, we have created designated Provider Care Teams.  These Care Teams include your primary Cardiologist (physician) and Advanced Practice Providers (APPs -  Physician Assistants and Nurse Practitioners) who all work together to provide you with the care you need, when you need it. You will need a follow up appointment in:  3 months.  You may see Tonny Bollman, MD or one of the following Advanced Practice Providers on your designated Care Team: Tereso Newcomer, PA-C [place], [place] . Berton Bon, NP  Any Other Special Instructions Will Be Listed Below (If Applicable).

## 2018-03-12 NOTE — Progress Notes (Signed)
Cardiology Office Note:    Date:  03/12/2018   ID:  Ronald Hull, DOB [birth year], MRN [id number]  PCP:  Renford Dills, MD  Cardiologist:  Tonny Bollman, MD   Electrophysiologist:  Regan Lemming, MD   Referring MD: Renford Dills, MD   Chief Complaint  Patient presents with  . Hospitalization Follow-up    s/p cardiac cath, PCI    History of Present Illness:    Ronald Hull is a 72 y.o. male with persistent atrial fibrillation, hypertension, hyperlipidemia, sleep apnea.  He has undergone several cardioversions with return of atrial fibrillation.  He was previously on Amiodarone.  Most recently he was on Multaq.  He had recurrent atrial fibrillation in September 2019 while on a trip in [place].  He underwent cardioversion but went back into atrial fibrillation.  He underwent evaluation for PVI ablation.  CT scan did demonstrate significantly elevated calcium score and hemodynamically significant mid LAD and proximal LCx stenosis by FFR.  Therefore, he underwent cardiac catheterization which demonstrated severe mid RCA stenosis and severe proximal LCx stenosis.  Both lesions were treated with drug-eluting stents.  There was mild to moderate diffuse LAD stenosis with borderline FFR.  He was enrolled in the Xience 28 trial.  He will continue on Apixaban, clopidogrel and aspirin for 30 days.  He will then discontinue clopidogrel and remain on aspirin and Apixaban.    Ronald Hull returns for follow-up.  He is here alone.  Since discharge from the hospital, he has been doing well.  He does have some chest discomfort when he lays down.  This has been a chronic symptom without change.  He also has chronic issues with leg swelling.  There have been no significant changes.  He does feel like he has better exercise tolerance since his PCI.  He denies paroxysmal nocturnal dyspnea or syncope.  He has excessive bruising but no melena, hematochezia or hematuria.  Prior CV studies:   The  following studies were reviewed today:  Cardiac catheterization 02/24/2018 LAD proximal 50, mid 50 (DFR 0.89) LCx ostial 95 RCA proximal 30, mid 80 PCI: 3.5 x 15 mm Xience Sierra DES to the ostial LCx PCI: 4 x 15 mm Xience Sierra DES to the mid RCA    Cardiac CTA w/ Calcium Score 02/17/18 IMPRESSION: 1. There is normal pulmonary vein drainage into the left atrium. 2. The left atrial appendage is fairly small with no evidence for a thrombus. 3. The esophagus runs in the left atrial midline and is not in the proximity to any of the pulmonary veins. 4. The esophagus runs to the left from the left atrial midline and is in the proximity to the left common pulmonary vein trunk. 5. Calcium score is 3318 that correlates with 96 percentile for age/sex. We will try to obtain CT FFR, if unable to process sec to motion, a functional nuclear study with D SPECT camera is recommended. 6. Mild calcifications of the aortic valve leaflet extending into the LVOT. 7. Posterior mitral annular calcifications  FFR  1. Left Main: No significant stenosis. 2. LAD: Proximal: 0.94, distal 0.77. 3. LCX: 0.5. 4. RCA: No significant stenosis.  IMPRESSION: 1. CT FFR analysis showed hemodynamically significant stenosis in the mid LAD and proximal LCX arteries. A cardiac catheterization is Recommended.  Echo 10/24/2015 Moderate LVH, EF 55-60, normal wall motion, trivial AI, mild MR, mild LAE, normal RVSF, mild RAE, PASP 29  Past Medical History:  Diagnosis Date  . Arthritis   .  CAD (coronary artery disease) 02/24/2018   LHC 10/19: pLAD 50, mLAD 50 (dFFR=0.89), oLCx 95, pRCA 30, mRCA 80 >> PCI: DES to LCx; DES to RCA // Xience28 Trial participant (will DC Plavix after 30 d and remain on ASA + Apixaban)  . Complication of anesthesia    BP dropped post colonoscopy  . Hypercholesteremia   . Hypertension   . Persistent atrial fibrillation   . Sleep apnea    does use a cpap  . Wears glasses     Surgical Hx: The patient  has a past surgical history that includes Colonoscopy; Knee arthroscopy (2000); Shoulder arthroscopy with rotator cuff repair and subacromial decompression (Right, 07/15/2012); Cardioversion (N/A, 11/01/2015); Cardioversion (N/A, 11/23/2015); Cardioversion (N/A, 10/12/2017); LEFT HEART CATH AND CORONARY ANGIOGRAPHY (N/A, 02/24/2018); INTRAVASCULAR PRESSURE WIRE/FFR STUDY (N/A, 02/24/2018); and CORONARY STENT INTERVENTION (N/A, 02/24/2018).   Current Medications: Current Meds  Medication Sig  . ASPIRIN LOW DOSE 81 MG chewable tablet   . atorvastatin (LIPITOR) 40 MG tablet Take 1 tablet (40 mg total) by mouth daily.  . clopidogrel (PLAVIX) 75 MG tablet Take 1 tablet (75 mg total) by mouth daily with breakfast.  . ELIQUIS 5 MG TABS tablet TAKE 1 TABLET(5 MG) BY MOUTH TWICE DAILY  . metoprolol tartrate (LOPRESSOR) 50 MG tablet Take 1 tablet (50 mg total) by mouth 2 (two) times daily.  . nitroGLYCERIN (NITROSTAT) 0.4 MG SL tablet Place 1 tablet (0.4 mg total) under the tongue every 5 (five) minutes as needed.     Allergies:   Lisinopril   Social History   Tobacco Use  . Smoking status: Former Smoker    Last attempt to quit: 07/09/1973    Years since quitting: 44.7  . Smokeless tobacco: Never Used  Substance Use Topics  . Alcohol use: Yes    Comment: beers daily  . Drug use: No     Family Hx: The patient's family history includes Cancer in his sister; Clotting disorder in his mother; Heart disease in his brother; Heart failure in his father; Hypertension in his sister; Other in his mother.  ROS:   Please see the history of present illness.    Review of Systems  Cardiovascular: Positive for irregular heartbeat.  Respiratory: Positive for shortness of breath.   Hematologic/Lymphatic: Bruises/bleeds easily.   All other systems reviewed and are negative.   EKGs/Labs/Other Test Reviewed:    EKG:  EKG is  ordered today.  The ekg ordered today demonstrates atrial  fibrillation, heart rate 66, normal axis, no change from prior tracing  Recent Labs: 02/25/2018: BUN 12; Creatinine, Ser 1.06; Hemoglobin 14.0; Platelets 170; Potassium 4.6; Sodium 136   Recent Lipid Panel No results found for: CHOL, TRIG, HDL, CHOLHDL, LDLCALC, LDLDIRECT  Physical Exam:    VS:  BP 118/80   Pulse 86   Ht 5\' 11"  (1.803 m)   Wt 280 lb 12.8 oz (127.4 kg)   SpO2 96%   BMI 39.16 kg/m     Wt Readings from Last 3 Encounters:  03/12/18 280 lb 12.8 oz (127.4 kg)  02/25/18 284 lb 6.3 oz (129 kg)  02/22/18 287 lb 12.8 oz (130.5 kg)     Physical Exam  Constitutional: He is oriented to person, place, and time. He appears well-developed and well-nourished. No distress.  HENT:  Head: Normocephalic and atraumatic.  Neck: Neck supple. No JVD present. No thyromegaly present.  Cardiovascular: Normal rate, regular rhythm, S1 normal and S2 normal.  No murmur heard. Pulmonary/Chest: Breath sounds normal. He  has no rales.  Abdominal: Soft. There is no hepatomegaly.  Musculoskeletal: He exhibits no edema.  Lymphadenopathy:    He has no cervical adenopathy.  Neurological: He is alert and oriented to person, place, and time.  Skin: Skin is warm and dry.  Psychiatric: He has a normal mood and affect.    ASSESSMENT & PLAN:    Coronary artery disease involving native coronary artery of native heart without angina pectoris  Status post recent two-vessel PCI with a DES to the ostial LCx and DES to mid RCA.  He has residual moderate nonobstructive disease in LAD that is treated medically.  He does have improved exercise tolerance since his PCI.  He has not had any anginal symptoms.  He is enrolled in the Xience 28 trial.  He will be taken off of Plavix 30 days after PCI and remain on aspirin and apixaban.  Continue beta-blocker, atorvastatin.  He plans to start cardiac rehabilitation in January.  He has follow-up with our research staff next month.  Follow up with Dr. Excell Seltzer in 3 mos.    Persistent atrial fibrillation Continue Apixaban.  Heart rate is controlled.  He has follow-up next month with Dr. Elberta Fortis to discuss further management of atrial fibrillation.  Pure hypercholesterolemia  Continue moderate intensity statin.  Obtain follow-up fasting lipids and LFTs in 1 month.  Essential hypertension The patient's blood pressure is controlled on his current regimen.  Continue current therapy.    Dispo:  Return in about 3 months (around 06/12/2018) for Routine Follow Up, w/ Dr. Excell Seltzer.   Medication Adjustments/Labs and Tests Ordered: Current medicines are reviewed at length with the patient today.  Concerns regarding medicines are outlined above.  Tests Ordered: Orders Placed This Encounter  Procedures  . Lipid panel  . Hepatic function panel  . EKG 12-Lead   Medication Changes: No orders of the defined types were placed in this encounter.   Signed, Tereso Newcomer, PA-C  03/12/2018 12:44 PM    [facility] [address] [place], [place], [place]  [address] Phone: [phone]; Fax: [phone]

## 2018-03-23 ENCOUNTER — Ambulatory Visit (HOSPITAL_COMMUNITY): Payer: Medicare Other | Admitting: Nurse Practitioner

## 2018-03-30 VITALS — BP 133/73 | HR 75 | Wt 278.2 lb

## 2018-03-30 DIAGNOSIS — Z006 Encounter for examination for normal comparison and control in clinical research program: Secondary | ICD-10-CM

## 2018-03-30 NOTE — Research (Signed)
Pt came in to see Dr. Riley Kill for 1 month f/u Xience research study. VS recorded and taken at check in.

## 2018-03-30 NOTE — Research (Addendum)
Pt met with Dr. Lia Foyer during visit, no complications noted from patient.  Will proceed with Xience 28 study, discontinuation of Plavix at this time.  Contact Type:  _0  Office/hospital _1  Phone  Subject compliant with DAPT regimen per protocol requirement?  _2  Yes  _3  No  Any changes in antiplatelet medication? _4  Yes  _5  No  Any new or changed adverse events? _6  Yes  _7  No  Any new or changed anticoagulant medication? _8  Yes  _9  No     Only to be used at 1 month follow up visit     Subject is free from Myocardial Infarction, repeat coronary revascularization, stroke, or stent thrombosis within the 1 month after stenting? _10  Yes  _11  No  Subject is compliant with 1 month DAPT without interruption of either ASA and/or P2Y12 receptor inhibitor for >7 consecutive days _12  Yes  _13  No  Investigator is comfortable with stopping P2Y12 inhibitor after 57-monthvisit and subject has been instructed to stop P2Y12? _14  Yes  _15  No  If questions 3, 4, and 5 are all Yes - Subject is 1 month clear?    Current Outpatient Medications:  .  ASPIRIN LOW DOSE 81 MG chewable tablet, , Disp: , Rfl: 0 .  atorvastatin (LIPITOR) 40 MG tablet, Take 1 tablet (40 mg total) by mouth daily., Disp: 90 tablet, Rfl: 3 .  clopidogrel (PLAVIX) 75 MG tablet, Take 1 tablet (75 mg total) by mouth daily with breakfast., Disp: 30 tablet, Rfl: 0 .  ELIQUIS 5 MG TABS tablet, TAKE 1 TABLET(5 MG) BY MOUTH TWICE DAILY, Disp: 60 tablet, Rfl: 5 .  metoprolol tartrate (LOPRESSOR) 50 MG tablet, Take 1 tablet (50 mg total) by mouth 2 (two) times daily., Disp: 180 tablet, Rfl: 3 .  nitroGLYCERIN (NITROSTAT) 0.4 MG SL tablet, Place 1 tablet (0.4 mg total) under the tongue every 5 (five) minutes as needed., Disp: 25 tablet, Rfl: 2 _16  Yes  _17  No

## 2018-04-02 NOTE — Research (Signed)
Patient was seen and evaluated at follow up visit.  Case discussed with Dr. Excell Seltzer after he reviewed the films..  Patient with recent presentation undergoing 2 vessel stenting for CAD, and enrolled in the Xience protocol at discharge.  Seen by Dr. Clifton James at that time.  Patient has researched the topic and is in favor.  He has had nose bleeds since discharge, and previously had muscle bleed.  He was on triple therapy, but stopped clopidogrel several days ago and has not had any issues.  Patient is leaving for trip to the [place] in a couple of days.   Protocol again reviewed with patient. He wishes to proceed with the protocol as stated with dc of clopidogrel continuing, remaining on his Eliquis and aspirin combination.

## 2018-04-05 ENCOUNTER — Telehealth (HOSPITAL_COMMUNITY): Payer: Self-pay

## 2018-04-09 ENCOUNTER — Ambulatory Visit: Payer: Medicare Other | Admitting: Cardiology

## 2018-04-09 ENCOUNTER — Encounter: Payer: Self-pay | Admitting: Cardiology

## 2018-04-09 ENCOUNTER — Other Ambulatory Visit: Payer: Medicare Other | Admitting: *Deleted

## 2018-04-09 VITALS — BP 124/80 | HR 101 | Ht 71.0 in | Wt 270.0 lb

## 2018-04-09 DIAGNOSIS — I10 Essential (primary) hypertension: Secondary | ICD-10-CM

## 2018-04-09 DIAGNOSIS — I4819 Other persistent atrial fibrillation: Secondary | ICD-10-CM

## 2018-04-09 DIAGNOSIS — E7800 Pure hypercholesterolemia, unspecified: Secondary | ICD-10-CM

## 2018-04-09 DIAGNOSIS — I2510 Atherosclerotic heart disease of native coronary artery without angina pectoris: Secondary | ICD-10-CM | POA: Diagnosis not present

## 2018-04-09 DIAGNOSIS — G4733 Obstructive sleep apnea (adult) (pediatric): Secondary | ICD-10-CM

## 2018-04-09 DIAGNOSIS — Z01812 Encounter for preprocedural laboratory examination: Secondary | ICD-10-CM

## 2018-04-09 LAB — HEPATIC FUNCTION PANEL
ALT: 26 IU/L (ref 0–44)
AST: 24 IU/L (ref 0–40)
Albumin: 4 g/dL (ref 3.5–4.8)
Alkaline Phosphatase: 93 IU/L (ref 39–117)
Bilirubin Total: 0.6 mg/dL (ref 0.0–1.2)
Bilirubin, Direct: 0.18 mg/dL (ref 0.00–0.40)
Total Protein: 6.5 g/dL (ref 6.0–8.5)

## 2018-04-09 LAB — LIPID PANEL
Chol/HDL Ratio: 3.1 ratio (ref 0.0–5.0)
Cholesterol, Total: 96 mg/dL — ABNORMAL LOW (ref 100–199)
HDL: 31 mg/dL — ABNORMAL LOW (ref >39)
LDL Calculated: 50 mg/dL (ref 0–99)
Triglycerides: 77 mg/dL (ref 0–149)
VLDL Cholesterol Cal: 15 mg/dL (ref 5–40)

## 2018-04-09 NOTE — Patient Instructions (Signed)
Medication Instructions:  Your physician recommends that you continue on your current medications as directed. Please refer to the Current Medication list given to you today.  * If you need a refill on your cardiac medications before your next appointment, please call your pharmacy.   Labwork: Your physician recommends that you return for pre procedure lab work between: 04/22/18 - 05/12/18 *We will only notify you of abnormal results, otherwise continue current treatment plan.  Testing/Procedures: Your physician has recommended that you have an ablation. Catheter ablation is a medical procedure used to treat some cardiac arrhythmias (irregular heartbeats). During catheter ablation, a long, thin, flexible tube is put into a blood vessel in your groin (upper thigh), or neck. This tube is called an ablation catheter. It is then guided to your heart through the blood vessel. Radio frequency waves destroy small areas of heart tissue where abnormal heartbeats may cause an arrhythmia to start. Please see the instruction sheet given to you today.  Instructions for your ablation: 1. Please arrive at the [facility], Main Entrance "A", of [facility] at 5:30 a.m. on 05/20/2018. 2. Do not eat or drink after midnight the night prior to the procedure. 3. Do not miss any doses of ELIQUIS prior to the morning of the procedure.  4. Do not take any medications the morning of the procedure. 5. Both of your groins will need to be shaved for this procedure (if needed). We ask that you do this yourself at home 1-2 days prior to the procedure.  If you are unable/uncomfortable to do yourself, the hospital staff will shave you the day of your procedure (if needed). 6. Plan for an overnight stay in the hospital. 7. You will need someone to drive you home at discharge.   Follow-Up: Your physician recommends that you schedule a follow-up appointment in: 4 weeks, after your procedure on 05/20/18, with Rudi Coco  in the AFib clinic.  Your physician recommends that you schedule a follow-up appointment in: 3 months, after your procedure on 05/20/18, with Dr. Elberta Fortis.  *Please note that any paperwork needing to be filled out by the provider will need to be addressed at the front desk prior to seeing the provider. Please note that any FMLA, disability or other documents regarding health condition is subject to a $25.00 charge that must be received prior to completion of paperwork in the form of a money order or check.  Thank you for choosing [facility]!!   Dory Horn, RN [phone]  Any Other Special Instructions Will Be Listed Below (If Applicable).   Cardiac Ablation Cardiac ablation is a procedure to disable (ablate) a small amount of heart tissue in very specific places. The heart has many electrical connections. Sometimes these connections are abnormal and can cause the heart to beat very fast or irregularly. Ablating some of the problem areas can improve the heart rhythm or return it to normal. Ablation may be done for people who:  Have Wolff-Parkinson-White syndrome.  Have fast heart rhythms (tachycardia).  Have taken medicines for an abnormal heart rhythm (arrhythmia) that were not effective or caused side effects.  Have a high-risk heartbeat that may be life-threatening.  During the procedure, a small incision is made in the neck or the groin, and a long, thin, flexible tube (catheter) is inserted into the incision and moved to the heart. Small devices (electrodes) on the tip of the catheter will send out electrical currents. A type of X-ray (fluoroscopy) will be used to  help guide the catheter and to provide images of the heart. Tell a health care provider about:  Any allergies you have.  All medicines you are taking, including vitamins, herbs, eye drops, creams, and over-the-counter medicines.  Any problems you or family members have had with anesthetic medicines.  Any blood  disorders you have.  Any surgeries you have had.  Any medical conditions you have, such as kidney failure.  Whether you are pregnant or may be pregnant. What are the risks? Generally, this is a safe procedure. However, problems may occur, including:  Infection.  Bruising and bleeding at the catheter insertion site.  Bleeding into the chest, especially into the sac that surrounds the heart. This is a serious complication.  Stroke or blood clots.  Damage to other structures or organs.  Allergic reaction to medicines or dyes.  Need for a permanent pacemaker if the normal electrical system is damaged. A pacemaker is a small computer that sends electrical signals to the heart and helps your heart beat normally.  The procedure not being fully effective. This may not be recognized until months later. Repeat ablation procedures are sometimes required.  What happens before the procedure?  Follow instructions from your health care provider about eating or drinking restrictions.  Ask your health care provider about: ? Changing or stopping your regular medicines. This is especially important if you are taking diabetes medicines or blood thinners. ? Taking medicines such as aspirin and ibuprofen. These medicines can thin your blood. Do not take these medicines before your procedure if your health care provider instructs you not to.  Plan to have someone take you home from the hospital or clinic.  If you will be going home right after the procedure, plan to have someone with you for 24 hours. What happens during the procedure?  To lower your risk of infection: ? Your health care team will wash or sanitize their hands. ? Your skin will be washed with soap. ? Hair may be removed from the incision area.  An IV tube will be inserted into one of your veins.  You will be given a medicine to help you relax (sedative).  The skin on your neck or groin will be numbed.  An incision will be  made in your neck or your groin.  A needle will be inserted through the incision and into a large vein in your neck or groin.  A catheter will be inserted into the needle and moved to your heart.  Dye may be injected through the catheter to help your surgeon see the area of the heart that needs treatment.  Electrical currents will be sent from the catheter to ablate heart tissue in desired areas. There are three types of energy that may be used to ablate heart tissue: ? Heat (radiofrequency energy). ? Laser energy. ? Extreme cold (cryoablation).  When the necessary tissue has been ablated, the catheter will be removed.  Pressure will be held on the catheter insertion area to prevent excessive bleeding.  A bandage (dressing) will be placed over the catheter insertion area. The procedure may vary among health care providers and hospitals. What happens after the procedure?  Your blood pressure, heart rate, breathing rate, and blood oxygen level will be monitored until the medicines you were given have worn off.  Your catheter insertion area will be monitored for bleeding. You will need to lie still for a few hours to ensure that you do not bleed from the catheter insertion area.  Do not drive for 24 hours or as long as directed by your health care provider. Summary  Cardiac ablation is a procedure to disable (ablate) a small amount of heart tissue in very specific places. Ablating some of the problem areas can improve the heart rhythm or return it to normal.  During the procedure, electrical currents will be sent from the catheter to ablate heart tissue in desired areas. This information is not intended to replace advice given to you by your health care provider. Make sure you discuss any questions you have with your health care provider. Document Released: 08/31/2008 Document Revised: 03/03/2016 Document Reviewed: 03/03/2016 Elsevier Interactive Patient Education  [employer].

## 2018-04-09 NOTE — Progress Notes (Addendum)
Electrophysiology Office Note   Date:  04/09/2018   ID:  [name], DOB February 08, 1946, MRN [id number]  PCP:  Renford Dills, MD   Primary Electrophysiologist:  Regan Lemming, MD    No chief complaint on file.    History of Present Illness: Ronald Hull is a 72 y.o. male who presents today for electrophysiology evaluation.   He presented to his primary physician on 10/02/15 with an elevated and irregular heart rate. Cardioversion was attempted and was unsuccessful with repeat cardioversion successfully to 360 J. He was initially placed on amiodarone and has since been switched to Multaq. He went back into atrial fibrillation and had cardioversion 10/12/17.  He was scheduled for ablation, but on his pre-ablation CT scan was found to have coronary artery disease.  He was sent to catheterization and had a drug-eluting stent to his circumflex and RCA on 02/24/2018.  Today, denies symptoms of palpitations, chest pain, shortness of breath, orthopnea, PND, lower extremity edema, claudication, dizziness, presyncope, syncope, bleeding, or neurologic sequela. The patient is tolerating medications without difficulties.  Overall he is feeling well.  He does note that he is still in atrial fibrillation.  He feels that his A. fib is worse in the mornings and better in the evenings.  He would like to have an ablation.  Past Medical History:  Diagnosis Date  . Arthritis   . CAD (coronary artery disease) 02/24/2018   LHC 10/19: pLAD 50, mLAD 50 (dFFR=0.89), oLCx 95, pRCA 30, mRCA 80 >> PCI: DES to LCx; DES to RCA // [id number] Trial participant (Lariya Kinzie DC Plavix after 30 d and remain on ASA + Apixaban)  . Complication of anesthesia    BP dropped post colonoscopy  . Hypercholesteremia   . Hypertension   . Persistent atrial fibrillation   . Sleep apnea    does use a cpap  . Wears glasses    Past Surgical History:  Procedure Laterality Date  . CARDIOVERSION N/A 11/01/2015   Procedure:  CARDIOVERSION;  Surgeon: [name], MD;  Location: [facility] ENDOSCOPY;  Service: Cardiovascular;  Laterality: N/A;  . CARDIOVERSION N/A 11/23/2015   Procedure: CARDIOVERSION;  Surgeon: Stace Peace [name], MD;  Location: [facility] ENDOSCOPY;  Service: Cardiovascular;  Laterality: N/A;  . CARDIOVERSION N/A 10/12/2017   Procedure: CARDIOVERSION;  Surgeon: [name], MD;  Location: [facility] ENDOSCOPY;  Service: Cardiovascular;  Laterality: N/A;  . COLONOSCOPY    . CORONARY STENT INTERVENTION N/A 02/24/2018   Procedure: CORONARY STENT INTERVENTION;  Surgeon: Tonny Bollman, MD;  Location: [facility] INVASIVE CV LAB;  Service: Cardiovascular;  Laterality: N/A;  . INTRAVASCULAR PRESSURE WIRE/FFR STUDY N/A 02/24/2018   Procedure: INTRAVASCULAR PRESSURE WIRE/FFR STUDY;  Surgeon: Tonny Bollman, MD;  Location: [facility] INVASIVE CV LAB;  Service: Cardiovascular;  Laterality: N/A;  . KNEE ARTHROSCOPY  2000  . LEFT HEART CATH AND CORONARY ANGIOGRAPHY N/A 02/24/2018   Procedure: LEFT HEART CATH AND CORONARY ANGIOGRAPHY;  Surgeon: Tonny Bollman, MD;  Location: [facility] INVASIVE CV LAB;  Service: Cardiovascular;  Laterality: N/A;  . SHOULDER ARTHROSCOPY WITH ROTATOR CUFF REPAIR AND SUBACROMIAL DECOMPRESSION Right 07/15/2012   Procedure: RIGHT ARTHROSCOPY SHOULDER DECOMPRESSION  SUBACROMIAL PARTIAL ACROMIOPLASTY WITH CORACOACROMIAL RELEASE,  DISTAL CLAVICULECTOMY, resect biceps debride labrium WITH ROTATOR CUFF REPAIR;  Surgeon: [name], MD;  Location: [facility];  Service: Orthopedics;  Laterality: Right;     Current Outpatient Medications  Medication Sig Dispense Refill  . ASPIRIN LOW DOSE 81 MG chewable tablet   0  .  atorvastatin (LIPITOR) 40 MG tablet Take 1 tablet (40 mg total) by mouth daily. 90 tablet 3  . clopidogrel (PLAVIX) 75 MG tablet Take 1 tablet (75 mg total) by mouth daily with breakfast. 30 tablet 0  . ELIQUIS 5 MG TABS tablet TAKE 1 TABLET(5 MG) BY MOUTH TWICE DAILY 60 tablet 5  . metoprolol  tartrate (LOPRESSOR) 50 MG tablet Take 1 tablet (50 mg total) by mouth 2 (two) times daily. 180 tablet 3  . nitroGLYCERIN (NITROSTAT) 0.4 MG SL tablet Place 1 tablet (0.4 mg total) under the tongue every 5 (five) minutes as needed. 25 tablet 2   No current facility-administered medications for this visit.     Allergies:   Lisinopril   Social History:  The patient  reports that he quit smoking about 44 years ago. He has never used smokeless tobacco. He reports current alcohol use. He reports that he does not use drugs.   Family History:  The patient's family history includes Cancer in his sister; Clotting disorder in his mother; Heart disease in his brother; Heart failure in his father; Hypertension in his sister; Other in his mother.    ROS:  Please see the history of present illness.   Otherwise, review of systems is positive for palpitations, SOB.   All other systems are reviewed and negative.   PHYSICAL EXAM: VS:  BP 124/80   Pulse (!) 101   Ht 5\' 11"  (1.803 m)   Wt 270 lb (122.5 kg)   SpO2 98%   BMI 37.66 kg/m  , BMI Body mass index is 37.66 kg/m. GEN: Well nourished, well developed, in no acute distress  HEENT: normal  Neck: no JVD, carotid bruits, or masses Cardiac: iRRR; no murmurs, rubs, or gallops,no edema  Respiratory:  clear to auscultation bilaterally, normal work of breathing GI: soft, nontender, nondistended, + BS MS: no deformity or atrophy  Skin: warm and dry Neuro:  Strength and sensation are intact Psych: euthymic mood, full affect  EKG:  EKG is not ordered today. Personal review of the ekg ordered 03/12/18 shows AF, rate 66   Recent Labs: 02/25/2018: BUN 12; Creatinine, Ser 1.06; Hemoglobin 14.0; Platelets 170; Potassium 4.6; Sodium 136    Lipid Panel  No results found for: CHOL, TRIG, HDL, CHOLHDL, VLDL, LDLCALC, LDLDIRECT   Wt Readings from Last 3 Encounters:  04/09/18 270 lb (122.5 kg)  03/30/18 278 lb 3.2 oz (126.2 kg)  03/12/18 280 lb 12.8 oz  (127.4 kg)      Other studies Reviewed: Additional studies/ records that were reviewed today include: TTE 09/2015 - Left ventricle: The cavity size was normal. Wall thickness was   increased in a pattern of moderate LVH. Systolic function was   normal. The estimated ejection fraction was in the range of 55%   to 60%. Indeterminant diastolic function. Wall motion was normal;   there were no regional wall motion abnormalities. - Aortic valve: There was no stenosis. There was trivial   regurgitation. - Mitral valve: Mildly calcified annulus. There was mild   regurgitation. - Left atrium: The atrium was mildly dilated. - Right ventricle: The cavity size was normal. Systolic function   was normal. - Right atrium: The atrium was mildly dilated. - Tricuspid valve: Peak RV-RA gradient (S): 26 mm Hg. - Pulmonary arteries: PA peak pressure: 29 mm Hg (S). - Inferior vena cava: The vessel was normal in size. The   respirophasic diameter changes were in the normal range (= 50%),   consistent  with normal central venous pressure.  LHC 02/24/18  Mid RCA lesion is 80% stenosed.  Prox RCA lesion is 30% stenosed.  Ost Cx to Prox Cx lesion is 95% stenosed.  Prox LAD lesion is 50% stenosed.  Prox LAD to Mid LAD lesion is 50% stenosed.  A drug-eluting stent was successfully placed using a STENT SIERRA 3.50 X 15 MM.  Post intervention, there is a 0% residual stenosis.  A drug-eluting stent was successfully placed using a STENT SIERRA 4.00 X 15 MM.  Post intervention, there is a 0% residual stenosis.   ASSESSMENT AND PLAN:  1.  Persistent atrial fibrillation: Currently on Eliquis.  He is failed Multitak.  He is currently in atrial fibrillation.  He would prefer to have ablation other than other antiarrhythmic medications.  We Nanami Whitelaw plan for AF ablation.  Risks and benefits were discussed.  Risks include bleeding, tamponade, heart block, stroke, damage to surrounding organs.  He understands the  risks and is agreed to the procedure.  This patients CHA2DS2-VASc Score and unadjusted Ischemic Stroke Rate (% per year) is equal to 2.2 % stroke rate/year from a score of 2  Above score calculated as 1 point each if present [CHF, HTN, DM, Vascular=MI/PAD/Aortic Plaque, Age if 65-74, or Male] Above score calculated as 2 points each if present [Age > 75, or Stroke/TIA/TE]   2. Hypertension: Controlled.  No changes.  3. OSA: Stressed CPAP compliance  4.  Morbid obesity: Encouraged a diet and exercise  5.  Coronary artery disease: Found on CT pre-ablation.  Is now status post stenting of the circumflex and RCA.  Anwen Cannedy need to continue aspirin and Eliquis.  Current medicines are reviewed at length with the patient today.   The patient does not have concerns regarding his medicines.  The following changes were made today: None  Labs/ tests ordered today include:  Orders Placed This Encounter  Procedures  . CBC  . Basic metabolic panel   Case discussed with primary cardiology  Disposition:   FU with Real Cona 4 months  Signed, Rilea Arutyunyan [name], MD  04/09/2018 9:32 AM     [facility] [address] [place] [address] [phone] (office) [phone] (fax)

## 2018-04-09 NOTE — Progress Notes (Signed)
I think it should be fine to hold Eliquis as you normally do. thx Will

## 2018-04-26 ENCOUNTER — Telehealth (HOSPITAL_COMMUNITY): Payer: Self-pay

## 2018-05-03 ENCOUNTER — Telehealth (HOSPITAL_COMMUNITY): Payer: Self-pay | Admitting: *Deleted

## 2018-05-03 NOTE — Telephone Encounter (Signed)
Cardiac Rehab Medication Review by a Pharmacist  Does the patient  feel that his/her medications are working for him/her?  yes  Has the patient been experiencing any side effects to the medications prescribed?  no  Does the patient measure his/her own blood pressure or blood glucose at home?  Yes; typically 120-140/70-90  Does the patient have any problems obtaining medications due to transportation or finances?   yes  Understanding of regimen: excellent Understanding of indications: excellent Potential of compliance: excellent    Pharmacist comments: no concerns identified    Ronald Hull D PGY1 Pharmacy Resident   05/03/2018      6:33 PM

## 2018-05-03 NOTE — Telephone Encounter (Signed)
-----   Message from Will [name], MD sent at 04/30/2018  9:30 AM EST ----- Should be just fine. He has atrial fibrillation which should not affect the test. Thanks! ----- Message ----- From: Nikki Dom, RN Sent: 04/30/2018   8:59 AM EST To: Will [name], MD  Dr. Elberta Fortis,  Mr. Goldie is scheduled for Cardiac Rehab Orientation on Thursday 05/06/2018.  During this he will perform a 6 MWT.  He is scheduled for an ablation on 05/20/2018.  Is it ok for patient to participate in 6 MWT prior to his ablation?  Please advise.  Thank you, Nikki Dom

## 2018-05-06 ENCOUNTER — Encounter: Payer: Self-pay | Admitting: *Deleted

## 2018-05-06 ENCOUNTER — Encounter (HOSPITAL_COMMUNITY): Payer: Self-pay

## 2018-05-06 ENCOUNTER — Encounter (HOSPITAL_COMMUNITY)
Admission: RE | Admit: 2018-05-06 | Discharge: 2018-05-06 | Disposition: A | Discharge status: Home / Self Care | Payer: Medicare Other | Source: Ambulatory Visit | Attending: Cardiovascular Disease | Admitting: Cardiovascular Disease

## 2018-05-06 VITALS — BP 120/70 | HR 73 | Ht 71.0 in | Wt 277.8 lb

## 2018-05-06 DIAGNOSIS — Z955 Presence of coronary angioplasty implant and graft: Secondary | ICD-10-CM | POA: Insufficient documentation

## 2018-05-06 NOTE — Progress Notes (Signed)
Cardiac Individual Treatment Plan  Patient Details  Name: Ronald Hull MRN: [id number] Date of Birth: 04/21/1946 Referring Provider:     CARDIAC REHAB PHASE II ORIENTATION from 05/06/2018 in [facility] CARDIAC [facility]  Referring Provider  Tonny Bollman MD       Initial Encounter Date:    CARDIAC REHAB PHASE II ORIENTATION from 05/06/2018 in [facility] CARDIAC REHAB  Date  05/06/18      Visit Diagnosis: S/P drug eluting coronary stent placement  Patient's Home Medications on Admission:  Current Outpatient Medications:  .  ASPIRIN LOW DOSE 81 MG chewable tablet, , Disp: , Rfl: 0 .  atorvastatin (LIPITOR) 40 MG tablet, Take 1 tablet (40 mg total) by mouth daily., Disp: 90 tablet, Rfl: 3 .  ELIQUIS 5 MG TABS tablet, TAKE 1 TABLET(5 MG) BY MOUTH TWICE DAILY, Disp: 60 tablet, Rfl: 5 .  metoprolol tartrate (LOPRESSOR) 50 MG tablet, Take 1 tablet (50 mg total) by mouth 2 (two) times daily., Disp: 180 tablet, Rfl: 3 .  nitroGLYCERIN (NITROSTAT) 0.4 MG SL tablet, Place 1 tablet (0.4 mg total) under the tongue every 5 (five) minutes as needed. (Patient not taking: Reported on 05/03/2018), Disp: 25 tablet, Rfl: 2  Past Medical History: Past Medical History:  Diagnosis Date  . Arthritis   . CAD (coronary artery disease) 02/24/2018   LHC 10/19: pLAD 50, mLAD 50 (dFFR=0.89), oLCx 95, pRCA 30, mRCA 80 >> PCI: DES to LCx; DES to RCA // [id number] Trial participant (will DC Plavix after 30 d and remain on ASA + Apixaban)  . Complication of anesthesia    BP dropped post colonoscopy  . Hypercholesteremia   . Hypertension   . Persistent atrial fibrillation   . Sleep apnea    does use a cpap  . Wears glasses     Tobacco Use: Social History   Tobacco Use  Smoking Status Former Smoker  . Last attempt to quit: 07/09/1973  . Years since quitting: 44.8  Smokeless Tobacco Never Used    Labs: Recent Review Advice worker    Labs for ITP Cardiac and Pulmonary  Rehab Latest Ref Rng & Units 11/23/2015 04/09/2018   Cholestrol 100 - 199 mg/dL - 91(B)   LDLCALC 0 - 99 mg/dL - 50   HDL >16 mg/dL - 60(A)   Trlycerides 0 - 149 mg/dL - 77   TCO2 0 - 004 mmol/L 26 -      Capillary Blood Glucose: No results found for: GLUCAP   Exercise Target Goals: Exercise Program Goal: Individual exercise prescription set using results from initial 6 min walk test and THRR while considering  patient's activity barriers and safety.   Exercise Prescription Goal: Initial exercise prescription builds to 30-45 minutes a day of aerobic activity, 2-3 days per week.  Home exercise guidelines will be given to patient during program as part of exercise prescription that the participant will acknowledge.  Activity Barriers & Risk Stratification: Activity Barriers & Cardiac Risk Stratification - 05/06/18 1047      Activity Barriers & Cardiac Risk Stratification   Activity Barriers  Deconditioning;Balance Concerns;Other (comment)    Comments  Occasional Knee Pain    Cardiac Risk Stratification  Moderate       6 Minute Walk: 6 Minute Walk    Row Name 05/06/18 1044 05/06/18 1121       6 Minute Walk   Phase  Initial  -    Distance  1478 feet  -  Walk Time  6 minutes  -    # of Rest Breaks  0  -    MPH  2.79  -    METS  2.76  -    RPE  11  -    Perceived Dyspnea   0  -    VO2 Peak  9.65  -    Symptoms  No  Yes (comment)    Comments  -  Mild SOB +1    Resting HR  73 bpm  -    Resting BP  120/70  -    Resting Oxygen Saturation   95 %  -    Exercise Oxygen Saturation  during 6 min walk  99 %  -    Max Ex. HR  134 bpm  -    Max Ex. BP  130/74  -    2 Minute Post BP  110/76  -       Oxygen Initial Assessment:   Oxygen Re-Evaluation:   Oxygen Discharge (Final Oxygen Re-Evaluation):   Initial Exercise Prescription: Initial Exercise Prescription - 05/06/18 1000      Date of Initial Exercise RX and Referring Provider   Date  05/06/18    Referring  Provider  Tonny Bollman MD     Expected Discharge Date  08/13/18      NuStep   Level  2    SPM  75    Minutes  10    METs  2.3      Arm Ergometer   Level  2.5    Watts  40    Minutes  10    METs  2.68      Track   Laps  11    Minutes  10    METs  2.91      Prescription Details   Frequency (times per week)  3x    Duration  Progress to 30 minutes of continuous aerobic without signs/symptoms of physical distress      Intensity   THRR 40-80% of Max Heartrate  5-118    Ratings of Perceived Exertion  11-13    Perceived Dyspnea  0-4      Progression   Progression  Continue progressive overload as per policy without signs/symptoms or physical distress.      Resistance Training   Training Prescription  Yes    Weight  3lbs    Reps  10-15       Perform Capillary Blood Glucose checks as needed.  Exercise Prescription Changes:   Exercise Comments:   Exercise Goals and Review: Exercise Goals    Row Name 05/06/18 1041             Exercise Goals   Increase Physical Activity  Yes       Intervention  Provide advice, education, support and counseling about physical activity/exercise needs.;Develop an individualized exercise prescription for aerobic and resistive training based on initial evaluation findings, risk stratification, comorbidities and participant's personal goals.       Expected Outcomes  Short Term: Attend rehab on a regular basis to increase amount of physical activity.;Long Term: Add in home exercise to make exercise part of routine and to increase amount of physical activity.;Long Term: Exercising regularly at least 3-5 days a week.       Increase Strength and Stamina  Yes       Intervention  Provide advice, education, support and counseling about physical activity/exercise needs.;Develop an individualized exercise prescription for aerobic  and resistive training based on initial evaluation findings, risk stratification, comorbidities and participant's  personal goals.       Expected Outcomes  Short Term: Increase workloads from initial exercise prescription for resistance, speed, and METs.;Short Term: Perform resistance training exercises routinely during rehab and add in resistance training at home;Long Term: Improve cardiorespiratory fitness, muscular endurance and strength as measured by increased METs and functional capacity ( )       Able to understand and use rate of perceived exertion (RPE) scale  Yes       Intervention  Provide education and explanation on how to use RPE scale       Expected Outcomes  Short Term: Able to use RPE daily in rehab to express subjective intensity level;Long Term:  Able to use RPE to guide intensity level when exercising independently       Knowledge and understanding of Target Heart Rate Range (THRR)  Yes       Intervention  Provide education and explanation of THRR including how the numbers were predicted and where they are located for reference       Expected Outcomes  Short Term: Able to state/look up THRR;Short Term: Able to use daily as guideline for intensity in rehab;Long Term: Able to use THRR to govern intensity when exercising independently       Able to check pulse independently  Yes       Intervention  Review the importance of being able to check your own pulse for safety during independent exercise;Provide education and demonstration on how to check pulse in carotid and radial arteries.       Expected Outcomes  Short Term: Able to explain why pulse checking is important during independent exercise;Long Term: Able to check pulse independently and accurately       Understanding of Exercise Prescription  Yes       Intervention  Provide education, explanation, and written materials on patient's individual exercise prescription       Expected Outcomes  Short Term: Able to explain program exercise prescription;Long Term: Able to explain home exercise prescription to exercise independently           Exercise Goals Re-Evaluation :   Discharge Exercise Prescription (Final Exercise Prescription Changes):   Nutrition:  Target Goals: Understanding of nutrition guidelines, daily intake of sodium 1500mg , cholesterol 200mg , calories 30% from fat and 7% or less from saturated fats, daily to have 5 or more servings of fruits and vegetables.  Biometrics: Pre Biometrics - 05/06/18 1045      Pre Biometrics   Height  5\' 11"  (1.803 m)    Weight  126 kg    Waist Circumference  49 inches    Hip Circumference  53.5 inches    Waist to Hip Ratio  0.92 %    BMI (Calculated)  38.76    Triceps Skinfold  24 mm    % Body Fat  37.6 %    Grip Strength  44 kg    Flexibility  9.5 in    Single Leg Stand  7.84 seconds        Nutrition Therapy Plan and Nutrition Goals:   Nutrition Assessments:   Nutrition Goals Re-Evaluation:   Nutrition Goals Re-Evaluation:   Nutrition Goals Discharge (Final Nutrition Goals Re-Evaluation):   Psychosocial: Target Goals: Acknowledge presence or absence of significant depression and/or stress, maximize coping skills, provide positive support system. Participant is able to verbalize types and ability to use techniques and skills needed for  reducing stress and depression.  Initial Review & Psychosocial Screening: Initial Psych Review & Screening - 05/06/18 0942      Initial Review   Current issues with  None Identified      Family Dynamics   Good Support System?  Yes   Nadine Counts lists his wife, children, and close friends as sources of support.      Barriers   Psychosocial barriers to participate in program  There are no identifiable barriers or psychosocial needs.      Screening Interventions   Interventions  Encouraged to exercise;Provide feedback about the scores to participant    Expected Outcomes  Short Term goal: Identification and review with participant of any Quality of Life or Depression concerns found by scoring the questionnaire.;Long Term  goal: The participant improves quality of Life and PHQ9 Scores as seen by post scores and/or verbalization of changes       Quality of Life Scores: Quality of Life - 05/06/18 0949      Quality of Life   Select  Quality of Life      Quality of Life Scores   Health/Function Pre  16.6 %    Socioeconomic Pre  19.5 %    Psych/Spiritual Pre  18.93 %    Family Pre  22.4 %    GLOBAL Pre  18.57 %      Scores of 19 and below usually indicate a poorer quality of life in these areas.  A difference of  2-3 points is a clinically meaningful difference.  A difference of 2-3 points in the total score of the Quality of Life Index has been associated with significant improvement in overall quality of life, self-image, physical symptoms, and general health in studies assessing change in quality of life.  PHQ-9: Recent Review Flowsheet Data    Depression screen [facility] 2/9 02/12/2015   Decreased Interest 0   Down, Depressed, Hopeless 0   PHQ - 2 Score 0     Interpretation of Total Score  Total Score Depression Severity:  1-4 = Minimal depression, 5-9 = Mild depression, 10-14 = Moderate depression, 15-19 = Moderately severe depression, 20-27 = Severe depression   Psychosocial Evaluation and Intervention:   Psychosocial Re-Evaluation:   Psychosocial Discharge (Final Psychosocial Re-Evaluation):   Vocational Rehabilitation: Provide vocational rehab assistance to qualifying candidates.   Vocational Rehab Evaluation & Intervention: Vocational Rehab - 05/06/18 0950      Initial Vocational Rehab Evaluation & Intervention   Assessment shows need for Vocational Rehabilitation  No       Education: Education Goals: Education classes will be provided on a weekly basis, covering required topics. Participant will state understanding/return demonstration of topics presented.  Learning Barriers/Preferences: Learning Barriers/Preferences - 05/06/18 1036      Learning Barriers/Preferences   Learning  Barriers  Sight    Learning Preferences  Written Material;Verbal Instruction       Education Topics: Count Your Pulse:  -Group instruction provided by verbal instruction, demonstration, patient participation and written materials to support subject.  Instructors address importance of being able to find your pulse and how to count your pulse when at home without a heart monitor.  Patients get hands on experience counting their pulse with staff help and individually.   Heart Attack, Angina, and Risk Factor Modification:  -Group instruction provided by verbal instruction, video, and written materials to support subject.  Instructors address signs and symptoms of angina and heart attacks.    Also discuss risk factors for heart  disease and how to make changes to improve heart health risk factors.   Functional Fitness:  -Group instruction provided by verbal instruction, demonstration, patient participation, and written materials to support subject.  Instructors address safety measures for doing things around the house.  Discuss how to get up and down off the floor, how to pick things up properly, how to safely get out of a chair without assistance, and balance training.   Meditation and Mindfulness:  -Group instruction provided by verbal instruction, patient participation, and written materials to support subject.  Instructor addresses importance of mindfulness and meditation practice to help reduce stress and improve awareness.  Instructor also leads participants through a meditation exercise.    Stretching for Flexibility and Mobility:  -Group instruction provided by verbal instruction, patient participation, and written materials to support subject.  Instructors lead participants through series of stretches that are designed to increase flexibility thus improving mobility.  These stretches are additional exercise for major muscle groups that are typically performed during regular warm up and cool  down.   Hands Only CPR:  -Group verbal, video, and participation provides a basic overview of AHA guidelines for community CPR. Role-play of emergencies allow participants the opportunity to practice calling for help and chest compression technique with discussion of AED use.   Hypertension: -Group verbal and written instruction that provides a basic overview of hypertension including the most recent diagnostic guidelines, risk factor reduction with self-care instructions and medication management.    Nutrition I class: Heart Healthy Eating:  -Group instruction provided by PowerPoint slides, verbal discussion, and written materials to support subject matter. The instructor gives an explanation and review of the Therapeutic Lifestyle Changes diet recommendations, which includes a discussion on lipid goals, dietary fat, sodium, fiber, plant stanol/sterol esters, sugar, and the components of a well-balanced, healthy diet.   Nutrition II class: Lifestyle Skills:  -Group instruction provided by PowerPoint slides, verbal discussion, and written materials to support subject matter. The instructor gives an explanation and review of label reading, grocery shopping for heart health, heart healthy recipe modifications, and ways to make healthier choices when eating out.   Diabetes Question & Answer:  -Group instruction provided by PowerPoint slides, verbal discussion, and written materials to support subject matter. The instructor gives an explanation and review of diabetes co-morbidities, pre- and post-prandial blood glucose goals, pre-exercise blood glucose goals, signs, symptoms, and treatment of hypoglycemia and hyperglycemia, and foot care basics.   Diabetes Blitz:  -Group instruction provided by PowerPoint slides, verbal discussion, and written materials to support subject matter. The instructor gives an explanation and review of the physiology behind type 1 and type 2 diabetes, diabetes  medications and rational behind using different medications, pre- and post-prandial blood glucose recommendations and Hemoglobin A1c goals, diabetes diet, and exercise including blood glucose guidelines for exercising safely.    Portion Distortion:  -Group instruction provided by PowerPoint slides, verbal discussion, written materials, and food models to support subject matter. The instructor gives an explanation of serving size versus portion size, changes in portions sizes over the last 20 years, and what consists of a serving from each food group.   Stress Management:  -Group instruction provided by verbal instruction, video, and written materials to support subject matter.  Instructors review role of stress in heart disease and how to cope with stress positively.     Exercising on Your Own:  -Group instruction provided by verbal instruction, power point, and written materials to support subject.  Instructors discuss  benefits of exercise, components of exercise, frequency and intensity of exercise, and end points for exercise.  Also discuss use of nitroglycerin and activating EMS.  Review options of places to exercise outside of rehab.  Review guidelines for sex with heart disease.   Cardiac Drugs I:  -Group instruction provided by verbal instruction and written materials to support subject.  Instructor reviews cardiac drug classes: antiplatelets, anticoagulants, beta blockers, and statins.  Instructor discusses reasons, side effects, and lifestyle considerations for each drug class.   Cardiac Drugs II:  -Group instruction provided by verbal instruction and written materials to support subject.  Instructor reviews cardiac drug classes: angiotensin converting enzyme inhibitors (ACE-I), angiotensin II receptor blockers (ARBs), nitrates, and calcium channel blockers.  Instructor discusses reasons, side effects, and lifestyle considerations for each drug class.   Anatomy and Physiology of the  Circulatory System:  Group verbal and written instruction and models provide basic cardiac anatomy and physiology, with the coronary electrical and arterial systems. Review of: AMI, Angina, Valve disease, Heart Failure, Peripheral Artery Disease, Cardiac Arrhythmia, Pacemakers, and the ICD.   Other Education:  -Group or individual verbal, written, or video instructions that support the educational goals of the cardiac rehab program.   Holiday Eating Survival Tips:  -Group instruction provided by PowerPoint slides, verbal discussion, and written materials to support subject matter. The instructor gives patients tips, tricks, and techniques to help them not only survive but enjoy the holidays despite the onslaught of food that accompanies the holidays.   Knowledge Questionnaire Score: Knowledge Questionnaire Score - 05/06/18 1036      Knowledge Questionnaire Score   Pre Score  19/24       Core Components/Risk Factors/Patient Goals at Admission: Personal Goals and Risk Factors at Admission - 05/06/18 1040      Core Components/Risk Factors/Patient Goals on Admission    Weight Management  Yes;Obesity    Intervention  Weight Management: Develop a combined nutrition and exercise program designed to reach desired caloric intake, while maintaining appropriate intake of nutrient and fiber, sodium and fats, and appropriate energy expenditure required for the weight goal.;Weight Management: Provide education and appropriate resources to help participant work on and attain dietary goals.;Obesity: Provide education and appropriate resources to help participant work on and attain dietary goals.;Weight Management/Obesity: Establish reasonable short term and long term weight goals.    Admit Weight  277 lb 12.5 oz (126 kg)    Goal Weight: Short Term  260 lb (117.9 kg)    Goal Weight: Long Term  230 lb (104.3 kg)    Expected Outcomes  Short Term: Continue to assess and modify interventions until short term  weight is achieved;Long Term: Adherence to nutrition and physical activity/exercise program aimed toward attainment of established weight goal;Weight Loss: Understanding of general recommendations for a balanced deficit meal plan, which promotes 1-2 lb weight loss per week and includes a negative energy balance of [phone] kcal/d;Understanding recommendations for meals to include 15-35% energy as protein, 25-35% energy from fat, 35-60% energy from carbohydrates, less than 200mg  of dietary cholesterol, 20-35 gm of total fiber daily;Understanding of distribution of calorie intake throughout the day with the consumption of 4-5 meals/snacks    Hypertension  Yes    Intervention  Provide education on lifestyle modifcations including regular physical activity/exercise, weight management, moderate sodium restriction and increased consumption of fresh fruit, vegetables, and low fat dairy, alcohol moderation, and smoking cessation.;Monitor prescription use compliance.    Expected Outcomes  Short Term: Continued assessment and  intervention until BP is < 140/97mm HG in hypertensive participants. < 130/2mm HG in hypertensive participants with diabetes, heart failure or chronic kidney disease.;Long Term: Maintenance of blood pressure at goal levels.    Lipids  Yes    Intervention  Provide education and support for participant on nutrition & aerobic/resistive exercise along with prescribed medications to achieve LDL 70mg , HDL >40mg .    Expected Outcomes  Short Term: Participant states understanding of desired cholesterol values and is compliant with medications prescribed. Participant is following exercise prescription and nutrition guidelines.;Long Term: Cholesterol controlled with medications as prescribed, with individualized exercise RX and with personalized nutrition plan. Value goals: LDL < 70mg , HDL > 40 mg.       Core Components/Risk Factors/Patient Goals Review:    Core Components/Risk Factors/Patient Goals  at Discharge (Final Review):    ITP Comments: ITP Comments    Row Name 05/06/18 0940 05/06/18 1032         ITP Comments  Dr. Armanda Magic, Medical Director  Dr. Armanda Magic, Medical Director         Comments: Patient attended orientation from [phone] to [phone] to review rules and guidelines for program. Completed 6 minute walk test, Intitial ITP, and exercise prescription.  VSS. Telemetry-A Fib.  This is noted on prior 12 Lead EKGs.  Patient's HR increased during walk test to 134, which is above THR range of 59-118.  MD aware.  Pt is scheduled for ablation procedure on 05/20/2018.  Asymptomatic.

## 2018-05-06 NOTE — Progress Notes (Signed)
Ronald Hull 73 y.o. male DOB: 10-26-1945 MRN: [id number]      Nutrition Note  1. S/P drug eluting coronary stent placement    Past Medical History:  Diagnosis Date  . Arthritis   . CAD (coronary artery disease) 02/24/2018   LHC 10/19: pLAD 50, mLAD 50 (dFFR=0.89), oLCx 95, pRCA 30, mRCA 80 >> PCI: DES to LCx; DES to RCA // Xience28 Trial participant (will DC Plavix after 30 d and remain on ASA + Apixaban)  . Complication of anesthesia    BP dropped post colonoscopy  . Hypercholesteremia   . Hypertension   . Persistent atrial fibrillation   . Sleep apnea    does use a cpap  . Wears glasses    Meds reviewed.    Current Outpatient Medications (Cardiovascular):  .  atorvastatin (LIPITOR) 40 MG tablet, Take 1 tablet (40 mg total) by mouth daily. .  metoprolol tartrate (LOPRESSOR) 50 MG tablet, Take 1 tablet (50 mg total) by mouth 2 (two) times daily. .  nitroGLYCERIN (NITROSTAT) 0.4 MG SL tablet, Place 1 tablet (0.4 mg total) under the tongue every 5 (five) minutes as needed. (Patient not taking: Reported on 05/03/2018)   Current Outpatient Medications (Analgesics):  Marland Kitchen  ASPIRIN LOW DOSE 81 MG chewable tablet,   Current Outpatient Medications (Hematological):  Marland Kitchen  ELIQUIS 5 MG TABS tablet, TAKE 1 TABLET(5 MG) BY MOUTH TWICE DAILY    HT: Ht Readings from Last 1 Encounters:  05/06/18 5\' 11"  (1.803 m)    WT: Wt Readings from Last 5 Encounters:  05/06/18 277 lb 12.5 oz (126 kg)  04/09/18 270 lb (122.5 kg)  03/30/18 278 lb 3.2 oz (126.2 kg)  03/12/18 280 lb 12.8 oz (127.4 kg)  02/25/18 284 lb 6.3 oz (129 kg)     Body mass index is 38.74 kg/m.   Current tobacco use? No  Labs:  Lipid Panel     Component Value Date/Time   CHOL 96 (L) 04/09/2018 0959   TRIG 77 04/09/2018 0959   HDL 31 (L) 04/09/2018 0959   CHOLHDL 3.1 04/09/2018 0959   LDLCALC 50 04/09/2018 0959    No results found for: HGBA1C CBG (last 3)  No results for input(s): GLUCAP in the last 72  hours.  Nutrition Note Spoke with pt. Nutrition plan and goals reviewed with pt. Pt is following Step 1 of the Therapeutic Lifestyle Changes diet. Pt wants to lose wt. Pt has not been trying to lose wt. Wt loss and heart healthy eating tips reviewed (label reading, how to build a healthy plate, portion sizes, eating frequently across the day). Additionally discussed eating mindfully/ intuitively with patient and set goal with him to try these exercises at home. Per discussion, pt does not use canned/convenience foods often. Pt does not add salt to food. Pt does not eat out frequently. Pt expressed understanding of the information reviewed. Pt aware of nutrition education classes offered and would like to attend nutrition classes.  Nutrition Diagnosis ? Food-and nutrition-related knowledge deficit related to lack of exposure to information as related to diagnosis of: ? CVD ? Obese  II = 35-39.9 related to excessive energy intake as evidenced by a Body mass index is 38.74 kg/m.  Nutrition Intervention ? Pt's individual nutrition plan and goals reviewed with pt. ? Pt given handouts for: ? Nutrition I class ? Nutrition II class    Nutrition Goal(s): ? Pt to identify and limit food sources of saturated fat, trans fat, refined carbohydrates and sodium ?  Pt to identify food quantities necessary to achieve weight loss of 6-24 lbs. at graduation from cardiac rehab. ? Pt to practice mindful and intuitive eating  Plan:  ? Pt to attend nutrition classes ? Nutrition I ? Nutrition II ? Portion Distortion  ? Will provide client-centered nutrition education as part of interdisciplinary care ? Monitor and evaluate progress toward nutrition goal with team.   Ross Marcus, MS, RD, LDN 05/06/2018 11:40 AM

## 2018-05-06 NOTE — Progress Notes (Signed)
Ronald Hull 73 y.o. male DOB [birth year] MRN [id number]       Nutrition  No diagnosis found. Past Medical History:  Diagnosis Date  . Arthritis   . CAD (coronary artery disease) 02/24/2018   LHC 10/19: pLAD 50, mLAD 50 (dFFR=0.89), oLCx 95, pRCA 30, mRCA 80 >> PCI: DES to LCx; DES to RCA // Xience28 Trial participant (will DC Plavix after 30 d and remain on ASA + Apixaban)  . Complication of anesthesia    BP dropped post colonoscopy  . Hypercholesteremia   . Hypertension   . Persistent atrial fibrillation   . Sleep apnea    does use a cpap  . Wears glasses    Meds reviewed.     Current Outpatient Medications (Cardiovascular):  .  atorvastatin (LIPITOR) 40 MG tablet, Take 1 tablet (40 mg total) by mouth daily. .  metoprolol tartrate (LOPRESSOR) 50 MG tablet, Take 1 tablet (50 mg total) by mouth 2 (two) times daily. .  nitroGLYCERIN (NITROSTAT) 0.4 MG SL tablet, Place 1 tablet (0.4 mg total) under the tongue every 5 (five) minutes as needed. (Patient not taking: Reported on 05/03/2018)   Current Outpatient Medications (Analgesics):  Marland Kitchen  ASPIRIN LOW DOSE 81 MG chewable tablet,   Current Outpatient Medications (Hematological):  Marland Kitchen  ELIQUIS 5 MG TABS tablet, TAKE 1 TABLET(5 MG) BY MOUTH TWICE DAILY    HT: Ht Readings from Last 1 Encounters:  04/09/18 5\' 11"  (1.803 m)    WT: Wt Readings from Last 5 Encounters:  04/09/18 270 lb (122.5 kg)  03/30/18 278 lb 3.2 oz (126.2 kg)  03/12/18 280 lb 12.8 oz (127.4 kg)  02/25/18 284 lb 6.3 oz (129 kg)  02/22/18 287 lb 12.8 oz (130.5 kg)     BMI =  37.66     04/09/18  Current tobacco use? No       Labs:  Lipid Panel     Component Value Date/Time   CHOL 96 (L) 04/09/2018 0959   TRIG 77 04/09/2018 0959   HDL 31 (L) 04/09/2018 0959   CHOLHDL 3.1 04/09/2018 0959   LDLCALC 50 04/09/2018 0959    No results found for: HGBA1C CBG (last 3)  No results for input(s): GLUCAP in the last 72 hours.  Nutrition Diagnosis ? Food-and  nutrition-related knowledge deficit related to lack of exposure to information as related to diagnosis of: ? CVD  ? Obese  II = 35-39.9 related to excessive energy intake as evidenced by a 37.66     04/09/18   Nutrition Goal(s):  ? To be determined  Plan:  Pt to attend nutrition classes ? Nutrition I ? Nutrition II ? Portion Distortion  ? Diabetes Blitz ? Diabetes Q & A Will provide client-centered nutrition education as part of interdisciplinary care.   Monitor and evaluate progress toward nutrition goal with team.  Ross Marcus, MS, RD, LDN 05/06/2018 8:07 AM

## 2018-05-10 ENCOUNTER — Ambulatory Visit (HOSPITAL_COMMUNITY): Payer: Medicare Other

## 2018-05-10 ENCOUNTER — Encounter (HOSPITAL_COMMUNITY)
Admission: RE | Admit: 2018-05-10 | Discharge: 2018-05-10 | Disposition: A | Discharge status: Home / Self Care | Payer: Medicare Other | Source: Ambulatory Visit | Attending: Cardiovascular Disease | Admitting: Cardiovascular Disease

## 2018-05-10 DIAGNOSIS — Z955 Presence of coronary angioplasty implant and graft: Secondary | ICD-10-CM | POA: Diagnosis not present

## 2018-05-10 NOTE — Progress Notes (Signed)
QUALITY OF LIFE SCORE REVIEW  Pt completed Quality of Life survey as a participant in Cardiac Rehab. Scores 21.0 or below are considered low. Pt score very low in several areas Overall 62130, Health and Function 16.60, socioeconomic 19.50, physiological and spiritual 18.93, family 22.40. Patient quality of life slightly altered by physical constraints which limits ability to perform as prior to recent cardiac illness.   Ronald Hull has had recurrent A Fib which is frustrating to him.  He feels that his breathing and fatigue has improved since the stents were placed.  Offered emotional support and reassurance.  Will continue to monitor and intervene as necessary.

## 2018-05-10 NOTE — Progress Notes (Signed)
Daily Session Note  Patient Details  Name: Ronald Hull MRN: [id number] Date of Birth: 04/23/1946 Referring Provider:     CARDIAC REHAB PHASE II ORIENTATION from 05/06/2018 in [facility]  Referring Provider  Cooper, Michael MD       Encounter Date: 05/10/2018  Check In: Session Check In - 05/10/18 1221      Check-In   Supervising physician immediately available to respond to emergencies  [facility] immediately available    Physician(s)  Dr. Grunz    Location  [facility]    Staff Present   , RN, BSN;Tyara Nevels, MS,ACSM CEP, Exercise Physiologist;Brittany Vance, BS, ACSM CEP, Exercise Physiologist;Joann Rion, RN, BSN    Medication changes reported      No    Fall or balance concerns reported     No    Tobacco Cessation  No Change    Warm-up and Cool-down  Performed as group-led instruction    Resistance Training Performed  Yes    VAD Patient?  No    PAD/SET Patient?  No      Pain Assessment   Currently in Pain?  No/denies       Capillary Blood Glucose: No results found for this or any previous visit (from the past 24 hour(s)).    Social History   Tobacco Use  Smoking Status Former Smoker  . Last attempt to quit: 07/09/1973  . Years since quitting: 44.8  Smokeless Tobacco Never Used    Goals Met:  Exercise tolerated well  Goals Unmet:  Not Applicable  Comments: Pt started cardiac rehab today.  Pt tolerated light exercise without difficulty. VSS, telemetry-A Fib, asymptomatic. At times, pt with HR elevated over THR.  Asymptomatic.  Medication list reconciled. Pt denies barriers to medicaiton compliance.  PSYCHOSOCIAL ASSESSMENT:  PHQ-0. Pt exhibits positive coping skills, hopeful outlook with supportive family. No psychosocial needs identified at this time, no psychosocial interventions necessary.  Pt oriented to exercise equipment and routine.    Understanding verbalized.    Dr. Traci Turner is  Medical Director for Cardiac Rehab at [facility]. 

## 2018-05-11 ENCOUNTER — Other Ambulatory Visit: Payer: Medicare Other

## 2018-05-11 NOTE — Research (Unsigned)
Entered in error

## 2018-05-12 ENCOUNTER — Encounter (HOSPITAL_COMMUNITY)
Admission: RE | Admit: 2018-05-12 | Discharge: 2018-05-12 | Disposition: A | Discharge status: Home / Self Care | Payer: Medicare Other | Source: Ambulatory Visit | Attending: Cardiovascular Disease | Admitting: Cardiovascular Disease

## 2018-05-12 ENCOUNTER — Ambulatory Visit (HOSPITAL_COMMUNITY): Payer: Medicare Other

## 2018-05-12 DIAGNOSIS — Z955 Presence of coronary angioplasty implant and graft: Secondary | ICD-10-CM

## 2018-05-13 ENCOUNTER — Other Ambulatory Visit: Payer: Medicare Other | Admitting: *Deleted

## 2018-05-13 DIAGNOSIS — I4819 Other persistent atrial fibrillation: Secondary | ICD-10-CM | POA: Diagnosis not present

## 2018-05-13 DIAGNOSIS — Z01812 Encounter for preprocedural laboratory examination: Secondary | ICD-10-CM

## 2018-05-14 ENCOUNTER — Encounter (HOSPITAL_COMMUNITY)
Admission: RE | Admit: 2018-05-14 | Discharge: 2018-05-14 | Disposition: A | Discharge status: Home / Self Care | Payer: Medicare Other | Source: Ambulatory Visit | Attending: Cardiovascular Disease | Admitting: Cardiovascular Disease

## 2018-05-14 ENCOUNTER — Ambulatory Visit (HOSPITAL_COMMUNITY): Payer: Medicare Other

## 2018-05-14 DIAGNOSIS — Z955 Presence of coronary angioplasty implant and graft: Secondary | ICD-10-CM | POA: Diagnosis not present

## 2018-05-14 LAB — BASIC METABOLIC PANEL
BUN/Creatinine Ratio: 19 (ref 10–24)
BUN: 15 mg/dL (ref 8–27)
CO2: 18 mmol/L — ABNORMAL LOW (ref 20–29)
Calcium: 8.8 mg/dL (ref 8.6–10.2)
Chloride: 102 mmol/L (ref 96–106)
Creatinine, Ser: 0.8 mg/dL (ref 0.76–1.27)
GFR calc Af Amer: 103 mL/min/{1.73_m2} (ref >59)
GFR calc non Af Amer: 89 mL/min/{1.73_m2} (ref >59)
Glucose: 95 mg/dL (ref 65–99)
Potassium: 4.8 mmol/L (ref 3.5–5.2)
Sodium: 137 mmol/L (ref 134–144)

## 2018-05-14 LAB — CBC
Hematocrit: 44.6 % (ref 37.5–51.0)
Hemoglobin: 15.2 g/dL (ref 13.0–17.7)
MCH: 31.7 pg (ref 26.6–33.0)
MCHC: 34.1 g/dL (ref 31.5–35.7)
MCV: 93 fL (ref 79–97)
Platelets: 187 10*3/uL (ref 150–450)
RBC: 4.8 x10E6/uL (ref 4.14–5.80)
RDW: 13.7 % (ref 11.6–15.4)
WBC: 7.2 10*3/uL (ref 3.4–10.8)

## 2018-05-17 ENCOUNTER — Ambulatory Visit (HOSPITAL_COMMUNITY): Payer: Medicare Other

## 2018-05-17 ENCOUNTER — Encounter (HOSPITAL_COMMUNITY)
Admission: RE | Admit: 2018-05-17 | Discharge: 2018-05-17 | Disposition: A | Discharge status: Home / Self Care | Payer: Medicare Other | Source: Ambulatory Visit | Attending: Cardiovascular Disease | Admitting: Cardiovascular Disease

## 2018-05-17 DIAGNOSIS — Z955 Presence of coronary angioplasty implant and graft: Secondary | ICD-10-CM | POA: Diagnosis not present

## 2018-05-19 ENCOUNTER — Encounter (HOSPITAL_COMMUNITY): Payer: Medicare Other

## 2018-05-19 ENCOUNTER — Ambulatory Visit (HOSPITAL_COMMUNITY): Payer: Medicare Other

## 2018-05-19 ENCOUNTER — Telehealth (HOSPITAL_COMMUNITY): Payer: Self-pay | Admitting: Internal Medicine

## 2018-05-19 DIAGNOSIS — Z006 Encounter for examination for normal comparison and control in clinical research program: Secondary | ICD-10-CM

## 2018-05-19 NOTE — Anesthesia Preprocedure Evaluation (Addendum)
Anesthesia Evaluation  Patient identified by MRN, date of birth, ID band Patient awake    Reviewed: Allergy & Precautions, H&P , NPO status , Patient's Chart, lab work & pertinent test results  Airway Mallampati: III  TM Distance: >3 FB Neck ROM: Full    Dental no notable dental hx. (+) Teeth Intact, Dental Advisory Given   Pulmonary sleep apnea and Continuous Positive Airway Pressure Ventilation , former smoker,    Pulmonary exam normal breath sounds clear to auscultation       Cardiovascular Exercise Tolerance: Good hypertension, Pt. on medications and Pt. on home beta blockers + CAD and + Cardiac Stents  + dysrhythmias Atrial Fibrillation  Rhythm:Irregular Rate:Normal     Neuro/Psych negative neurological ROS  negative psych ROS   GI/Hepatic negative GI ROS, Neg liver ROS,   Endo/Other  negative endocrine ROS  Renal/GU negative Renal ROS  negative genitourinary   Musculoskeletal  (+) Arthritis , Osteoarthritis,    Abdominal   Peds  Hematology negative hematology ROS (+)   Anesthesia Other Findings   Reproductive/Obstetrics negative OB ROS                            Anesthesia Physical Anesthesia Plan  ASA: III  Anesthesia Plan: General   Post-op Pain Management:    Induction: Intravenous  PONV Risk Score and Plan: 3 and Ondansetron, Dexamethasone and Treatment may vary due to age or medical condition  Airway Management Planned: Oral ETT  Additional Equipment:   Intra-op Plan:   Post-operative Plan: Extubation in OR  Informed Consent: I have reviewed the patients History and Physical, chart, labs and discussed the procedure including the risks, benefits and alternatives for the proposed anesthesia with the patient or authorized representative who has indicated his/her understanding and acceptance.     Dental advisory given  Plan Discussed with: CRNA  Anesthesia Plan  Comments:         Anesthesia Quick Evaluation

## 2018-05-19 NOTE — Research (Signed)
XIENCE 28 Research Study Follow-up completed. Pt states he is doing well and has started Cardiac Rehab which is going well. Will continue to follow up with patient.   Contact Type:  []  Office/hospital [x]  Phone  Subject compliant with DAPT regimen per protocol requirement?  [x]  Yes  []  No  Any changes in antiplatelet medication? []  Yes  [x]  No  Any new or changed adverse events? []  Yes  [x]  No  Any new or changed anticoagulant medication? []  Yes  [x]  No     Only to be used at 1 month follow up visit     Subject is free from Myocardial Infarction, repeat coronary revascularization, stroke, or stent thrombosis within the 1 month after stenting? []  Yes  []  No  Subject is compliant with 1 month DAPT without interruption of either ASA and/or P2Y12 receptor inhibitor for >7 consecutive days []  Yes  []  No  Investigator is comfortable with stopping P2Y12 inhibitor after 59-month visit and subject has been instructed to stop P2Y12? []  Yes  []  No  If questions 3, 4, and 5 are all Yes - Subject is 1 month clear? []  Yes  []  No

## 2018-05-20 ENCOUNTER — Encounter (HOSPITAL_COMMUNITY)
Admission: RE | Disposition: A | Discharge status: Home / Self Care | Payer: Self-pay | Source: Home / Self Care | Attending: Cardiology

## 2018-05-20 ENCOUNTER — Other Ambulatory Visit: Payer: Self-pay

## 2018-05-20 ENCOUNTER — Ambulatory Visit (HOSPITAL_COMMUNITY): Payer: Medicare Other | Admitting: Certified Registered Nurse Anesthetist

## 2018-05-20 ENCOUNTER — Ambulatory Visit (HOSPITAL_COMMUNITY)
Admission: RE | Admit: 2018-05-20 | Discharge: 2018-05-21 | Disposition: A | Discharge status: Home / Self Care | Payer: Medicare Other | Attending: Cardiology | Admitting: Cardiology

## 2018-05-20 ENCOUNTER — Encounter (HOSPITAL_COMMUNITY): Payer: Self-pay | Admitting: Cardiology

## 2018-05-20 DIAGNOSIS — G4733 Obstructive sleep apnea (adult) (pediatric): Secondary | ICD-10-CM | POA: Insufficient documentation

## 2018-05-20 DIAGNOSIS — Z8249 Family history of ischemic heart disease and other diseases of the circulatory system: Secondary | ICD-10-CM | POA: Insufficient documentation

## 2018-05-20 DIAGNOSIS — I10 Essential (primary) hypertension: Secondary | ICD-10-CM | POA: Insufficient documentation

## 2018-05-20 DIAGNOSIS — Z7982 Long term (current) use of aspirin: Secondary | ICD-10-CM | POA: Diagnosis not present

## 2018-05-20 DIAGNOSIS — E785 Hyperlipidemia, unspecified: Secondary | ICD-10-CM | POA: Insufficient documentation

## 2018-05-20 DIAGNOSIS — Z79899 Other long term (current) drug therapy: Secondary | ICD-10-CM | POA: Diagnosis not present

## 2018-05-20 DIAGNOSIS — Z87891 Personal history of nicotine dependence: Secondary | ICD-10-CM | POA: Diagnosis not present

## 2018-05-20 DIAGNOSIS — E119 Type 2 diabetes mellitus without complications: Secondary | ICD-10-CM | POA: Insufficient documentation

## 2018-05-20 DIAGNOSIS — Z6836 Body mass index (BMI) 36.0-36.9, adult: Secondary | ICD-10-CM | POA: Insufficient documentation

## 2018-05-20 DIAGNOSIS — M1990 Unspecified osteoarthritis, unspecified site: Secondary | ICD-10-CM | POA: Diagnosis not present

## 2018-05-20 DIAGNOSIS — I2510 Atherosclerotic heart disease of native coronary artery without angina pectoris: Secondary | ICD-10-CM | POA: Insufficient documentation

## 2018-05-20 DIAGNOSIS — Z888 Allergy status to other drugs, medicaments and biological substances status: Secondary | ICD-10-CM | POA: Diagnosis not present

## 2018-05-20 DIAGNOSIS — Z7901 Long term (current) use of anticoagulants: Secondary | ICD-10-CM | POA: Diagnosis not present

## 2018-05-20 DIAGNOSIS — Z955 Presence of coronary angioplasty implant and graft: Secondary | ICD-10-CM | POA: Diagnosis not present

## 2018-05-20 DIAGNOSIS — I4819 Other persistent atrial fibrillation: Secondary | ICD-10-CM | POA: Diagnosis not present

## 2018-05-20 DIAGNOSIS — I4891 Unspecified atrial fibrillation: Secondary | ICD-10-CM | POA: Diagnosis not present

## 2018-05-20 HISTORY — PX: ATRIAL FIBRILLATION ABLATION: EP1191

## 2018-05-20 LAB — POCT ACTIVATED CLOTTING TIME
Activated Clotting Time: 290 seconds
Activated Clotting Time: 296 seconds
Activated Clotting Time: 307 seconds
Activated Clotting Time: 329 seconds
Activated Clotting Time: 186 seconds

## 2018-05-20 SURGERY — ATRIAL FIBRILLATION ABLATION
Anesthesia: General

## 2018-05-20 MED ORDER — DOBUTAMINE IN D5W 4-5 MG/ML-% IV SOLN
INTRAVENOUS | Status: AC
  Filled 2018-05-20: qty 250

## 2018-05-20 MED ORDER — SODIUM CHLORIDE 0.9% FLUSH: 3.0000 mL | INTRAVENOUS | Status: DC | PRN

## 2018-05-20 MED ORDER — SODIUM CHLORIDE 0.9 % IV SOLN
INTRAVENOUS | Status: DC | PRN
  Administered 2018-05-20: 20 ug/min via INTRAVENOUS

## 2018-05-20 MED ORDER — HEPARIN SODIUM (PORCINE) 1000 UNIT/ML IJ SOLN
INTRAMUSCULAR | Status: DC | PRN
  Administered 2018-05-20: 16000 [IU] via INTRAVENOUS
  Administered 2018-05-20 (×2): 3000 [IU] via INTRAVENOUS
  Administered 2018-05-20: 2000 [IU] via INTRAVENOUS
  Administered 2018-05-20: 4000 [IU] via INTRAVENOUS

## 2018-05-20 MED ORDER — ASPIRIN 81 MG PO CHEW
81.0000 mg | CHEWABLE_TABLET | Freq: Every evening | ORAL | Status: DC
  Administered 2018-05-20: 18:00:00 81 mg via ORAL
  Filled 2018-05-20: qty 1

## 2018-05-20 MED ORDER — DOBUTAMINE IN D5W 4-5 MG/ML-% IV SOLN
INTRAVENOUS | Status: DC | PRN
  Administered 2018-05-20: 20 ug/kg/min via INTRAVENOUS

## 2018-05-20 MED ORDER — MENTHOL 3 MG MT LOZG
1.0000 | LOZENGE | OROMUCOSAL | Status: DC | PRN
  Filled 2018-05-20 (×2): qty 9

## 2018-05-20 MED ORDER — BUPIVACAINE HCL (PF) 0.25 % IJ SOLN
INTRAMUSCULAR | Status: DC | PRN
  Administered 2018-05-20: 45 mL

## 2018-05-20 MED ORDER — HEPARIN (PORCINE) IN NACL 1000-0.9 UT/500ML-% IV SOLN
INTRAVENOUS | Status: AC
  Filled 2018-05-20: qty 500

## 2018-05-20 MED ORDER — ONDANSETRON HCL 4 MG/2ML IJ SOLN: 4.0000 mg | Freq: Four times a day (QID) | INTRAMUSCULAR | Status: DC | PRN

## 2018-05-20 MED ORDER — ROCURONIUM BROMIDE 100 MG/10ML IV SOLN
INTRAVENOUS | Status: DC | PRN
  Administered 2018-05-20 (×2): 50 mg via INTRAVENOUS

## 2018-05-20 MED ORDER — HEPARIN SODIUM (PORCINE) 1000 UNIT/ML IJ SOLN
INTRAMUSCULAR | Status: AC
  Filled 2018-05-20: qty 1

## 2018-05-20 MED ORDER — FENTANYL CITRATE (PF) 250 MCG/5ML IJ SOLN
INTRAMUSCULAR | Status: DC | PRN
  Administered 2018-05-20 (×2): 50 ug via INTRAVENOUS
  Administered 2018-05-20: 100 ug via INTRAVENOUS

## 2018-05-20 MED ORDER — ACETAMINOPHEN 325 MG PO TABS: 650.0000 mg | ORAL_TABLET | Freq: Four times a day (QID) | ORAL | Status: DC | PRN

## 2018-05-20 MED ORDER — SODIUM CHLORIDE 0.9 % IV SOLN
INTRAVENOUS | Status: DC
  Administered 2018-05-20 (×2): via INTRAVENOUS

## 2018-05-20 MED ORDER — ONDANSETRON HCL 4 MG/2ML IJ SOLN
INTRAMUSCULAR | Status: DC | PRN
  Administered 2018-05-20: 4 mg via INTRAVENOUS

## 2018-05-20 MED ORDER — PHENYLEPHRINE HCL 10 MG/ML IJ SOLN
INTRAMUSCULAR | Status: DC | PRN
  Administered 2018-05-20: 80 ug via INTRAVENOUS
  Administered 2018-05-20: 120 ug via INTRAVENOUS

## 2018-05-20 MED ORDER — PROTAMINE SULFATE 10 MG/ML IV SOLN
INTRAVENOUS | Status: DC | PRN
  Administered 2018-05-20: 50 mg via INTRAVENOUS

## 2018-05-20 MED ORDER — SODIUM CHLORIDE 0.9% FLUSH
3.0000 mL | Freq: Two times a day (BID) | INTRAVENOUS | Status: DC
  Administered 2018-05-20 (×2): 3 mL via INTRAVENOUS

## 2018-05-20 MED ORDER — METOPROLOL TARTRATE 50 MG PO TABS
50.0000 mg | ORAL_TABLET | Freq: Two times a day (BID) | ORAL | Status: DC
  Administered 2018-05-20 (×2): 50 mg via ORAL
  Filled 2018-05-20: qty 2
  Filled 2018-05-20: qty 1
  Filled 2018-05-20: qty 2
  Filled 2018-05-20: qty 1

## 2018-05-20 MED ORDER — SODIUM CHLORIDE 0.9 % IV SOLN: 250.0000 mL | INTRAVENOUS | Status: DC | PRN

## 2018-05-20 MED ORDER — BUPIVACAINE HCL (PF) 0.25 % IJ SOLN
INTRAMUSCULAR | Status: AC
  Filled 2018-05-20: qty 30

## 2018-05-20 MED ORDER — PROPOFOL 10 MG/ML IV BOLUS
INTRAVENOUS | Status: DC | PRN
  Administered 2018-05-20: 200 mg via INTRAVENOUS

## 2018-05-20 MED ORDER — HEPARIN SODIUM (PORCINE) 1000 UNIT/ML IJ SOLN
INTRAMUSCULAR | Status: DC | PRN
  Administered 2018-05-20: 1000 [IU] via INTRAVENOUS

## 2018-05-20 MED ORDER — ATORVASTATIN CALCIUM 40 MG PO TABS
40.0000 mg | ORAL_TABLET | Freq: Every evening | ORAL | Status: DC
  Administered 2018-05-20: 40 mg via ORAL
  Filled 2018-05-20: qty 1

## 2018-05-20 MED ORDER — APIXABAN 5 MG PO TABS
5.0000 mg | ORAL_TABLET | Freq: Two times a day (BID) | ORAL | Status: DC
  Administered 2018-05-20 (×2): 5 mg via ORAL
  Filled 2018-05-20 (×2): qty 1

## 2018-05-20 MED ORDER — OFF THE BEAT BOOK
Freq: Once | Status: AC
  Administered 2018-05-20: 22:00:00
  Filled 2018-05-20: qty 1

## 2018-05-20 MED ORDER — HEPARIN (PORCINE) IN NACL 1000-0.9 UT/500ML-% IV SOLN
INTRAVENOUS | Status: DC | PRN
  Administered 2018-05-20 (×5): 500 mL

## 2018-05-20 MED ORDER — LIDOCAINE HCL (CARDIAC) PF 100 MG/5ML IV SOSY
PREFILLED_SYRINGE | INTRAVENOUS | Status: DC | PRN
  Administered 2018-05-20: 60 mg via INTRAVENOUS

## 2018-05-20 MED ORDER — ACETAMINOPHEN 500 MG PO TABS
1000.0000 mg | ORAL_TABLET | Freq: Once | ORAL | Status: AC
  Administered 2018-05-20: 1000 mg via ORAL
  Filled 2018-05-20: qty 2

## 2018-05-20 MED ORDER — SUGAMMADEX SODIUM 200 MG/2ML IV SOLN
INTRAVENOUS | Status: DC | PRN
  Administered 2018-05-20: 200 mg via INTRAVENOUS

## 2018-05-20 MED ORDER — DEXAMETHASONE SODIUM PHOSPHATE 10 MG/ML IJ SOLN
INTRAMUSCULAR | Status: DC | PRN
  Administered 2018-05-20: 10 mg via INTRAVENOUS

## 2018-05-20 SURGICAL SUPPLY — 19 items
BLANKET WARM UNDERBOD FULL ACC (MISCELLANEOUS) ×2 IMPLANT
CATH MAPPNG PENTARAY F 2-6-2MM (CATHETERS) ×1 IMPLANT
CATH SMTCH THERMOCOOL SF DF (CATHETERS) ×2 IMPLANT
CATH SOUNDSTAR 3D IMAGING (CATHETERS) ×2 IMPLANT
CATH WEBSTER BI DIR CS D-F CRV (CATHETERS) ×2 IMPLANT
COVER SWIFTLINK CONNECTOR (BAG) ×2 IMPLANT
PACK EP LATEX FREE (CUSTOM PROCEDURE TRAY) ×1
PACK EP LF (CUSTOM PROCEDURE TRAY) ×1 IMPLANT
PAD PRO RADIOLUCENT 2001M-C (PAD) ×2 IMPLANT
PATCH CARTO3 (PAD) ×2 IMPLANT
PENTARAY F 2-6-2MM (CATHETERS) ×2
SHEATH AVANTI 11F 11CM (SHEATH) ×2 IMPLANT
SHEATH BAYLIS SUREFLEX  M 8.5 (SHEATH) ×2
SHEATH BAYLIS SUREFLEX M 8.5 (SHEATH) ×2 IMPLANT
SHEATH BAYLIS TRANSSEPTAL 98CM (NEEDLE) ×2 IMPLANT
SHEATH PINNACLE 7F 10CM (SHEATH) ×2 IMPLANT
SHEATH PINNACLE 8F 10CM (SHEATH) ×4 IMPLANT
SHEATH PINNACLE 9F 10CM (SHEATH) ×4 IMPLANT
TUBING SMART ABLATE COOLFLOW (TUBING) ×2 IMPLANT

## 2018-05-20 NOTE — Anesthesia Postprocedure Evaluation (Signed)
Anesthesia Post Note  Patient: Ronald Hull  Procedure(s) Performed: ATRIAL FIBRILLATION ABLATION (N/A )     Patient location during evaluation: PACU Anesthesia Type: General Level of consciousness: awake and alert Pain management: pain level controlled Vital Signs Assessment: post-procedure vital signs reviewed and stable Respiratory status: spontaneous breathing, nonlabored ventilation and respiratory function stable Cardiovascular status: blood pressure returned to baseline and stable Postop Assessment: no apparent nausea or vomiting Anesthetic complications: no    Last Vitals:  Vitals:   05/20/18 1225 05/20/18 1230  BP: 124/68 126/75  Pulse: 86 86  Resp: 20 (!) 21  Temp:    SpO2: 95% 93%    Last Pain:  Vitals:   05/20/18 1141  TempSrc: Temporal  PainSc: 0-No pain                 Jacinto Keil,W. EDMOND

## 2018-05-20 NOTE — Anesthesia Procedure Notes (Signed)
Procedure Name: Intubation Date/Time: 05/20/2018 7:54 AM Performed by: Adonis Housekeeper, CRNA Pre-anesthesia Checklist: Emergency Drugs available, Suction available, Patient being monitored and Patient identified Patient Re-evaluated:Patient Re-evaluated prior to induction Oxygen Delivery Method: Circle system utilized Preoxygenation: Pre-oxygenation with 100% oxygen Induction Type: IV induction Ventilation: Mask ventilation without difficulty, Oral airway inserted - appropriate to patient size and Two handed mask ventilation required Laryngoscope Size: Glidescope and 4 (elective) Grade View: Grade I Tube type: Oral Tube size: 7.5 mm Number of attempts: 1 Airway Equipment and Method: Rigid stylet and Video-laryngoscopy Placement Confirmation: ETT inserted through vocal cords under direct vision,  positive ETCO2 and breath sounds checked- equal and bilateral Secured at: 22 cm Tube secured with: Tape Dental Injury: Teeth and Oropharynx as per pre-operative assessment

## 2018-05-20 NOTE — Progress Notes (Signed)
Site area: left groin fv sheaths x2 pulled by Revonda Standard Site Prior to Removal:  Level 0 Pressure Applied For: 22 minutes Manual:   yes Patient Status During Pull:  stable Post Pull Site:  Level 0 Post Pull Instructions Given:  yes Post Pull Pulses Present: left dp palpable Dressing Applied:  Gauze and tegaderm Bedrest begins @ 1230 Comments: IV saline locked

## 2018-05-20 NOTE — Progress Notes (Signed)
Site area: 2 rt fv sheaths pulled and pressure held by Revonda Standard Site Prior to Removal:  Level 0 Pressure Applied For: 25 minutes Manual:   yes Patient Status During Pull:  stable Post Pull Site:  Level  0 Post Pull Instructions Given:  yes Post Pull Pulses Present: rt dp palpable Dressing Applied:  Gauze and tegaderm Bedrest begins @  Comments:

## 2018-05-20 NOTE — H&P (Addendum)
Ronald Hull has presented today for surgery, with the diagnosis of atrial fibrillation.  The various methods of treatment have been discussed with the patient and family. After consideration of risks, benefits and other options for treatment, the patient has consented to  Procedure(s): Catheter ablation as a surgical intervention .  Risks include but not limited to bleeding, tamponade, heart block, stroke, damage to surrounding organs, among others. The patient's history has been reviewed, patient examined, no change in status, stable for surgery.  I have reviewed the patient's chart and labs.  Questions were answered to the patient's satisfaction.    Loman Brooklyn, MD 05/20/2018 7:03 AM     Electrophysiology Office Note   Date:  05/20/2018   ID:  Ronald Hull, DOB 03-Mar-1946, MRN [id number]  PCP:  Renford Dills, MD   Primary Electrophysiologist:  Regan Lemming, MD    No chief complaint on file.    History of Present Illness: [name] is a 73 y.o. male who presents today for electrophysiology evaluation.   He presented to his primary physician on 10/02/15 with an elevated and irregular heart rate. Cardioversion was attempted and was unsuccessful with repeat cardioversion successfully to 360 J. He was initially placed on amiodarone and has since been switched to Multaq. He went back into atrial fibrillation and had cardioversion 10/12/17.  He was scheduled for ablation, but on his pre-ablation CT scan was found to have coronary artery disease.  He was sent to catheterization and had a drug-eluting stent to his circumflex and RCA on 02/24/2018.  Today, denies symptoms of palpitations, chest pain, shortness of breath, orthopnea, PND, lower extremity edema, claudication, dizziness, presyncope, syncope, bleeding, or neurologic sequela. The patient is tolerating medications without difficulties.  Overall he is feeling well.  He does note that he is still in atrial fibrillation.   He feels that his A. fib is worse in the mornings and better in the evenings.  He would like to have an ablation.  Past Medical History:  Diagnosis Date  . Arthritis   . CAD (coronary artery disease) 02/24/2018   LHC 10/19: pLAD 50, mLAD 50 (dFFR=0.89), oLCx 95, pRCA 30, mRCA 80 >> PCI: DES to LCx; DES to RCA // Xience28 Trial participant ([place] DC Plavix after 30 d and remain on ASA + Apixaban)  . Complication of anesthesia    BP dropped post colonoscopy  . Hypercholesteremia   . Hypertension   . Persistent atrial fibrillation   . Sleep apnea    does use a cpap  . Wears glasses    Past Surgical History:  Procedure Laterality Date  . CARDIOVERSION N/A 11/01/2015   Procedure: CARDIOVERSION;  Surgeon: [name], MD;  Location: [facility] ENDOSCOPY;  Service: Cardiovascular;  Laterality: N/A;  . CARDIOVERSION N/A 11/23/2015   Procedure: CARDIOVERSION;  Surgeon: Triston Lisanti [name], MD;  Location: [facility] ENDOSCOPY;  Service: Cardiovascular;  Laterality: N/A;  . CARDIOVERSION N/A 10/12/2017   Procedure: CARDIOVERSION;  Surgeon: [name], MD;  Location: [facility] ENDOSCOPY;  Service: Cardiovascular;  Laterality: N/A;  . COLONOSCOPY    . CORONARY STENT INTERVENTION N/A 02/24/2018   Procedure: CORONARY STENT INTERVENTION;  Surgeon: Tonny Bollman, MD;  Location: [facility] INVASIVE CV LAB;  Service: Cardiovascular;  Laterality: N/A;  . INTRAVASCULAR PRESSURE WIRE/FFR STUDY N/A 02/24/2018   Procedure: INTRAVASCULAR PRESSURE WIRE/FFR STUDY;  Surgeon: Tonny Bollman, MD;  Location: [facility] INVASIVE CV LAB;  Service: Cardiovascular;  Laterality: N/A;  . KNEE ARTHROSCOPY  2000  .  LEFT HEART CATH AND CORONARY ANGIOGRAPHY N/A 02/24/2018   Procedure: LEFT HEART CATH AND CORONARY ANGIOGRAPHY;  Surgeon: Tonny Bollman, MD;  Location: [facility] INVASIVE CV LAB;  Service: Cardiovascular;  Laterality: N/A;  . SHOULDER ARTHROSCOPY WITH ROTATOR CUFF REPAIR AND SUBACROMIAL DECOMPRESSION Right 07/15/2012   Procedure: RIGHT ARTHROSCOPY  SHOULDER DECOMPRESSION  SUBACROMIAL PARTIAL ACROMIOPLASTY WITH CORACOACROMIAL RELEASE,  DISTAL CLAVICULECTOMY, resect biceps debride labrium WITH ROTATOR CUFF REPAIR;  Surgeon: [name], MD;  Location: [facility];  Service: Orthopedics;  Laterality: Right;     Current Facility-Administered Medications  Medication Dose Route Frequency Provider Last Rate Last Dose  . 0.9 %  sodium chloride infusion   Intravenous Continuous Regan Lemming, MD 50 mL/hr at 05/20/18 9977      Allergies:   Lisinopril   Social History:  The patient  reports that he quit smoking about 44 years ago. He has never used smokeless tobacco. He reports current alcohol use. He reports that he does not use drugs.   Family History:  The patient's family history includes Cancer in his sister; Clotting disorder in his mother; Heart disease in his brother; Heart failure in his father; Hypertension in his sister; Other in his mother.    ROS:  Please see the history of present illness.   Otherwise, review of systems is positive for palpitations, SOB.   All other systems are reviewed and negative.   PHYSICAL EXAM: VS:  BP 125/81   Pulse 89   Temp 98.7 F (37.1 C) (Oral)   Resp 18   Ht 5\' 11"  (1.803 m)   Wt 120.2 kg   SpO2 96%   BMI 36.96 kg/m  , BMI Body mass index is 36.96 kg/m. GEN: Well nourished, well developed, in no acute distress  HEENT: normal  Neck: no JVD, carotid bruits, or masses Cardiac: iRRR; no murmurs, rubs, or gallops,no edema  Respiratory:  clear to auscultation bilaterally, normal work of breathing GI: soft, nontender, nondistended, + BS MS: no deformity or atrophy  Skin: warm and dry Neuro:  Strength and sensation are intact Psych: euthymic mood, full affect  EKG:  EKG is not ordered today. Personal review of the ekg ordered 03/12/18 shows AF, rate 66   Recent Labs: 04/09/2018: ALT 26 05/13/2018: BUN 15; Creatinine, Ser 0.80; Hemoglobin 15.2; Platelets 187;  Potassium 4.8; Sodium 137    Lipid Panel     Component Value Date/Time   CHOL 96 (L) 04/09/2018 0959   TRIG 77 04/09/2018 0959   HDL 31 (L) 04/09/2018 0959   CHOLHDL 3.1 04/09/2018 0959   LDLCALC 50 04/09/2018 0959     Wt Readings from Last 3 Encounters:  05/20/18 120.2 kg  05/06/18 126 kg  04/09/18 122.5 kg      Other studies Reviewed: Additional studies/ records that were reviewed today include: TTE 09/2015 - Left ventricle: The cavity size was normal. Wall thickness was   increased in a pattern of moderate LVH. Systolic function was   normal. The estimated ejection fraction was in the range of 55%   to 60%. Indeterminant diastolic function. Wall motion was normal;   there were no regional wall motion abnormalities. - Aortic valve: There was no stenosis. There was trivial   regurgitation. - Mitral valve: Mildly calcified annulus. There was mild   regurgitation. - Left atrium: The atrium was mildly dilated. - Right ventricle: The cavity size was normal. Systolic function   was normal. - Right atrium: The atrium was mildly dilated. -  Tricuspid valve: Peak RV-RA gradient (S): 26 mm Hg. - Pulmonary arteries: PA peak pressure: 29 mm Hg (S). - Inferior vena cava: The vessel was normal in size. The   respirophasic diameter changes were in the normal range (= 50%),   consistent with normal central venous pressure.  LHC 02/24/18  Mid RCA lesion is 80% stenosed.  Prox RCA lesion is 30% stenosed.  Ost Cx to Prox Cx lesion is 95% stenosed.  Prox LAD lesion is 50% stenosed.  Prox LAD to Mid LAD lesion is 50% stenosed.  A drug-eluting stent was successfully placed using a STENT SIERRA 3.50 X 15 MM.  Post intervention, there is a 0% residual stenosis.  A drug-eluting stent was successfully placed using a STENT SIERRA 4.00 X 15 MM.  Post intervention, there is a 0% residual stenosis.   ASSESSMENT AND PLAN:  1.  Persistent atrial fibrillation: Currently on Eliquis.   He is failed Multitak.  He is currently in atrial fibrillation.  He would prefer to have ablation other than other antiarrhythmic medications.  We Delissa Silba plan for AF ablation.  Risks and benefits were discussed.  Risks include bleeding, tamponade, heart block, stroke, damage to surrounding organs.  He understands the risks and is agreed to the procedure.  This patients CHA2DS2-VASc Score and unadjusted Ischemic Stroke Rate (% per year) is equal to 2.2 % stroke rate/year from a score of 2  Above score calculated as 1 point each if present [CHF, HTN, DM, Vascular=MI/PAD/Aortic Plaque, Age if 65-74, or Male] Above score calculated as 2 points each if present [Age > 75, or Stroke/TIA/TE]   2. Hypertension: Controlled.  No changes.  3. OSA: Stressed CPAP compliance  4.  Morbid obesity: Encouraged a diet and exercise  5.  Coronary artery disease: Found on CT pre-ablation.  Is now status post stenting of the circumflex and RCA.  Hermila Millis need to continue aspirin and Eliquis.  Current medicines are reviewed at length with the patient today.   The patient does not have concerns regarding his medicines.  The following changes were made today: None  Labs/ tests ordered today include:  Orders Placed This Encounter  Procedures  . Diet NPO time specified  . Informed consent details: transcribe and obtain patient signature  . Initiate Pre-op Protocol  . Void on call to EP Lab  . Confirm CBC and BMP (or CMP) results within 7 days for inpatient and 30 days for outpatient:  . Clip right and left femoral area PM before surgery  . Clip right internal jugular area PM before surgery  . Pre-admission testing diagnosis  . EP STUDY  . Insert peripheral IV   Case discussed with primary cardiology  Disposition:   FU with Jeison Delpilar 4 months  Signed, Leyda Vanderwerf [name], MD  05/20/2018 7:05 AM     [facility] [address] [place] [address] [phone]  (office) [phone] (fax)

## 2018-05-20 NOTE — Transfer of Care (Signed)
Immediate Anesthesia Transfer of Care Note  Patient: Ronald Hull  Procedure(s) Performed: ATRIAL FIBRILLATION ABLATION (N/A )  Patient Location: Cath Lab  Anesthesia Type:General  Level of Consciousness: awake, alert , oriented and patient cooperative  Airway & Oxygen Therapy: Patient Spontanous Breathing and Patient connected to nasal cannula oxygen  Post-op Assessment: Report given to RN and Post -op Vital signs reviewed and stable  Post vital signs: Reviewed and stable  Last Vitals:  Vitals Value Taken Time  BP 130/72 05/20/2018 11:14 AM  Temp 36.3 C 05/20/2018 11:12 AM  Pulse 88 05/20/2018 11:14 AM  Resp 20 05/20/2018 11:14 AM  SpO2 90 % 05/20/2018 11:14 AM  Vitals shown include unvalidated device data.  Last Pain:  Vitals:   05/20/18 1112  TempSrc: Temporal  PainSc: 0-No pain         Complications: No apparent anesthesia complications

## 2018-05-20 NOTE — Progress Notes (Signed)
Pt had attempted to shave his groin area at home and had nicked himself in multiple areas.

## 2018-05-20 NOTE — Progress Notes (Signed)
Patient has home CPAP will place on himself.

## 2018-05-21 ENCOUNTER — Ambulatory Visit (HOSPITAL_COMMUNITY): Payer: Medicare Other

## 2018-05-21 ENCOUNTER — Encounter (HOSPITAL_COMMUNITY): Payer: Medicare Other

## 2018-05-21 DIAGNOSIS — Z7982 Long term (current) use of aspirin: Secondary | ICD-10-CM | POA: Diagnosis not present

## 2018-05-21 DIAGNOSIS — M1990 Unspecified osteoarthritis, unspecified site: Secondary | ICD-10-CM | POA: Diagnosis not present

## 2018-05-21 DIAGNOSIS — E119 Type 2 diabetes mellitus without complications: Secondary | ICD-10-CM | POA: Diagnosis not present

## 2018-05-21 DIAGNOSIS — Z87891 Personal history of nicotine dependence: Secondary | ICD-10-CM | POA: Diagnosis not present

## 2018-05-21 DIAGNOSIS — Z955 Presence of coronary angioplasty implant and graft: Secondary | ICD-10-CM | POA: Diagnosis not present

## 2018-05-21 DIAGNOSIS — I2510 Atherosclerotic heart disease of native coronary artery without angina pectoris: Secondary | ICD-10-CM | POA: Diagnosis not present

## 2018-05-21 DIAGNOSIS — Z8249 Family history of ischemic heart disease and other diseases of the circulatory system: Secondary | ICD-10-CM | POA: Diagnosis not present

## 2018-05-21 DIAGNOSIS — Z6836 Body mass index (BMI) 36.0-36.9, adult: Secondary | ICD-10-CM | POA: Diagnosis not present

## 2018-05-21 DIAGNOSIS — I4819 Other persistent atrial fibrillation: Secondary | ICD-10-CM

## 2018-05-21 DIAGNOSIS — Z7901 Long term (current) use of anticoagulants: Secondary | ICD-10-CM | POA: Diagnosis not present

## 2018-05-21 DIAGNOSIS — E785 Hyperlipidemia, unspecified: Secondary | ICD-10-CM | POA: Diagnosis not present

## 2018-05-21 DIAGNOSIS — G4733 Obstructive sleep apnea (adult) (pediatric): Secondary | ICD-10-CM | POA: Diagnosis not present

## 2018-05-21 DIAGNOSIS — Z888 Allergy status to other drugs, medicaments and biological substances status: Secondary | ICD-10-CM | POA: Diagnosis not present

## 2018-05-21 DIAGNOSIS — Z79899 Other long term (current) drug therapy: Secondary | ICD-10-CM | POA: Diagnosis not present

## 2018-05-21 DIAGNOSIS — I10 Essential (primary) hypertension: Secondary | ICD-10-CM | POA: Diagnosis not present

## 2018-05-21 MED ORDER — HYDROCORTISONE 1 % EX CREA
TOPICAL_CREAM | Freq: Once | CUTANEOUS | Status: AC | PRN
  Administered 2018-05-21: 04:00:00 via TOPICAL
  Filled 2018-05-21: qty 28

## 2018-05-21 NOTE — Discharge Summary (Addendum)
ELECTROPHYSIOLOGY PROCEDURE DISCHARGE SUMMARY    Patient ID: Ronald Hull,  MRN: [id number], DOB/AGE: [birth year] 73 y.o.  Admit date: 05/20/2018 Discharge date: 05/21/2018  Primary Care Physician: Renford Dills, MD  Primary Cardiologist: Dr. Excell Seltzer Electrophysiologist: Dr. Elberta Fortis  Primary Discharge Diagnosis:  1. Persistent AFib     CHA2DS2Vasc is 3, on Eliquis, appropriately dosed  Secondary Discharge Diagnosis:  1. CAD     S/p PCI w/DES RCA/CX 02/14/18     On ASA (w/Eliquis), BB, statin 2. HTN 3. HLD 4. Obseity 5. OSA w/CPAP  Procedures This Admission:  1.  Electrophysiology study and radiofrequency catheter ablation on 05/20/2018 by Dr Elberta Fortis.  This study demonstrated  CONCLUSIONS: 1. Atrial fibrillation upon presentation.   2. Successful electrical isolation and anatomical encircling of all four pulmonary veins with radiofrequency current.  A WACA approach was used 3. Additional left atrial ablation was performed with a standard box lesion created along the posterior wall of the left atrium 4. Atrial fibrillation successfully cardioverted to sinus rhythm. 5. No early apparent complications  Brief HPI: Ronald Hull is a 73 y.o. male with a history of persistent atrial fibrillation.  Risks, benefits, and alternatives to catheter ablation of atrial fibrillation were reviewed with the patient who wished to proceed.  The patient underwent cardiac CT prior to the procedure which demonstrated no LAA thrombus.    Hospital Course:  The patient was admitted and underwent EPS/RFCA of atrial fibrillation with details as outlined above.  They were monitored on telemetry overnight which demonstrated SR.  The patient denies any CP, SOB, no procedure site discomfort.  He was examined by Dr. Elberta Fortis and considered to be stable for discharge.  Wound care and restrictions were reviewed with the patient.  The patient Temple Sporer be seen back by Rudi Coco, NP in 4 weeks and Dr Elberta Fortis  in 12 weeks for post ablation follow up.    Physical Exam: Vitals:   05/20/18 2000 05/20/18 2139 05/21/18 0320 05/21/18 0815  BP:  119/75 126/83 121/72  Pulse:  81    Resp: (!) 25  19 20   Temp:   97.6 F (36.4 C)   TempSrc:   Oral   SpO2:   97%   Weight:   124.8 kg   Height:        GEN- The patient is well appearing, alert and oriented x 3 today.   HEENT: normocephalic, atraumatic; sclera clear, conjunctiva pink; hearing intact;  neck supple  Lungs- CTA b/l, normal work of breathing.  No wheezes, rales, rhonchi Heart- RRR, no murmurs, rubs or gallops  GI- soft, non-tender, non-distended  Extremities- no clubbing, cyanosis, or edema; DP/PT 2+ bilaterally, b/l groin without hematoma/bruit MS- no significant deformity or atrophy Skin- warm and dry, no rash or lesion Psych- euthymic mood, full affect Neuro- strength and sensation are intact   Labs:   Lab Results  Component Value Date   WBC 7.2 05/13/2018   HGB 15.2 05/13/2018   HCT 44.6 05/13/2018   MCV 93 05/13/2018   PLT 187 05/13/2018   No results for input(s): NA, K, CL, CO2, BUN, CREATININE, CALCIUM, PROT, BILITOT, ALKPHOS, ALT, AST, GLUCOSE in the last 168 hours.  Invalid input(s): LABALBU   Discharge Medications:  Allergies as of 05/21/2018      Reactions   Lisinopril Cough      Medication List    TAKE these medications   acetaminophen 325 MG tablet Commonly known as:  TYLENOL Take 650 mg by  mouth every 6 (six) hours as needed (for pain.).   ASPIRIN LOW DOSE 81 MG chewable tablet Generic drug:  aspirin Chew 81 mg by mouth every evening.   atorvastatin 40 MG tablet Commonly known as:  LIPITOR Take 1 tablet (40 mg total) by mouth daily. What changed:  when to take this   ELIQUIS 5 MG Tabs tablet Generic drug:  apixaban TAKE 1 TABLET(5 MG) BY MOUTH TWICE DAILY   metoprolol tartrate 50 MG tablet Commonly known as:  LOPRESSOR Take 1 tablet (50 mg total) by mouth 2 (two) times daily.     nitroGLYCERIN 0.4 MG SL tablet Commonly known as:  NITROSTAT Place 1 tablet (0.4 mg total) under the tongue every 5 (five) minutes as needed.       Disposition:  Home Discharge Instructions    Diet - low sodium heart healthy   Complete by:  As directed    Increase activity slowly   Complete by:  As directed      Follow-up Information    [facility] Follow up.   Specialty:  Cardiology Why:  06/15/2018 @ 9:30AM Contact information: [address] [id number] mc [address] [place] [address] [phone]       Regan Lemming, MD Follow up.   Specialty:  Cardiology Why:  You Ashten Prats be called by Dr. Gershon Crane scheduler to make your 3 month follow up appointment Contact information: [address] [place] [address] [phone]           Duration of Discharge Encounter: Greater than 30 minutes including physician time.  Norma Fredrickson, PA-C 05/21/2018 8:37 AM   I have seen and examined this patient with Francis Dowse.  Agree with above, note added to reflect my findings.  On exam, RRR, no murmurs, lungs clear.  Patient had AF ablation for persistent  atrial fibrillation. Tolerated the procedure well without complaint this morning. Plan for discharge today with follow up in EP clinic.  Alliene Klugh M. Ashling Roane MD 05/21/2018 10:51 AM

## 2018-05-21 NOTE — Discharge Instructions (Signed)
Post procedure care instructions °No driving for 4 days. No lifting over 5 lbs for 1 week. No sexual activity for 1 week. You may return to work on 05/27/2018. Keep procedure site clean & dry. If you notice increased pain, swelling, bleeding or pus, call/return!  You may shower, but no soaking baths/hot tubs/pools for 1 week.  ° °You have an appointment set up with the Atrial Fibrillation Clinic.  Multiple studies have shown that being followed by a dedicated atrial fibrillation clinic in addition to the standard care you receive from your other physicians improves health. We believe that enrollment in the atrial fibrillation clinic will allow us to better care for you.  ° °The phone number to the Atrial Fibrillation Clinic is [phone]. The clinic is staffed Monday through Friday from 8:30am to 5pm. ° °Parking Directions: The clinic is located in the Heart and Vascular Building connected to [facility]. °1)From [address] turn on to [address] and go to the 3rd entrance  (Heart and Vascular entrance) on the right. °2)Look to the right for Heart &Vascular Parking Garage. °3)A code for the entrance is required please call the clinic to receive this.   °4)Take the elevators to the 1st floor. Registration is in the room with the glass walls at the end of the hallway. ° °If you have any trouble parking or locating the clinic, please don’t hesitate to call [phone]. °

## 2018-05-22 ENCOUNTER — Emergency Department (HOSPITAL_COMMUNITY): Payer: Medicare Other

## 2018-05-22 ENCOUNTER — Observation Stay (HOSPITAL_BASED_OUTPATIENT_CLINIC_OR_DEPARTMENT_OTHER): Payer: Medicare Other

## 2018-05-22 ENCOUNTER — Other Ambulatory Visit: Payer: Self-pay

## 2018-05-22 ENCOUNTER — Observation Stay (HOSPITAL_COMMUNITY)
Admission: EM | Admit: 2018-05-22 | Discharge: 2018-05-23 | Disposition: A | Discharge status: Home / Self Care | Payer: Medicare Other | Attending: Cardiology | Admitting: Cardiology

## 2018-05-22 ENCOUNTER — Encounter (HOSPITAL_COMMUNITY): Payer: Self-pay | Admitting: Emergency Medicine

## 2018-05-22 DIAGNOSIS — E785 Hyperlipidemia, unspecified: Secondary | ICD-10-CM | POA: Diagnosis present

## 2018-05-22 DIAGNOSIS — Z7982 Long term (current) use of aspirin: Secondary | ICD-10-CM | POA: Insufficient documentation

## 2018-05-22 DIAGNOSIS — R0789 Other chest pain: Secondary | ICD-10-CM | POA: Diagnosis not present

## 2018-05-22 DIAGNOSIS — R7989 Other specified abnormal findings of blood chemistry: Secondary | ICD-10-CM | POA: Insufficient documentation

## 2018-05-22 DIAGNOSIS — I351 Nonrheumatic aortic (valve) insufficiency: Secondary | ICD-10-CM

## 2018-05-22 DIAGNOSIS — Z79899 Other long term (current) drug therapy: Secondary | ICD-10-CM | POA: Insufficient documentation

## 2018-05-22 DIAGNOSIS — J40 Bronchitis, not specified as acute or chronic: Secondary | ICD-10-CM | POA: Diagnosis present

## 2018-05-22 DIAGNOSIS — R079 Chest pain, unspecified: Secondary | ICD-10-CM | POA: Diagnosis present

## 2018-05-22 DIAGNOSIS — I2510 Atherosclerotic heart disease of native coronary artery without angina pectoris: Secondary | ICD-10-CM | POA: Diagnosis present

## 2018-05-22 DIAGNOSIS — I361 Nonrheumatic tricuspid (valve) insufficiency: Secondary | ICD-10-CM | POA: Diagnosis not present

## 2018-05-22 DIAGNOSIS — Z9889 Other specified postprocedural states: Secondary | ICD-10-CM | POA: Diagnosis not present

## 2018-05-22 DIAGNOSIS — I10 Essential (primary) hypertension: Secondary | ICD-10-CM | POA: Diagnosis present

## 2018-05-22 DIAGNOSIS — R0602 Shortness of breath: Secondary | ICD-10-CM | POA: Diagnosis not present

## 2018-05-22 DIAGNOSIS — G8918 Other acute postprocedural pain: Principal | ICD-10-CM | POA: Insufficient documentation

## 2018-05-22 DIAGNOSIS — R778 Other specified abnormalities of plasma proteins: Secondary | ICD-10-CM

## 2018-05-22 DIAGNOSIS — Z7901 Long term (current) use of anticoagulants: Secondary | ICD-10-CM | POA: Diagnosis not present

## 2018-05-22 DIAGNOSIS — I4819 Other persistent atrial fibrillation: Secondary | ICD-10-CM | POA: Insufficient documentation

## 2018-05-22 HISTORY — DX: Chest pain, unspecified: R07.9

## 2018-05-22 HISTORY — DX: Bronchitis, not specified as acute or chronic: J40

## 2018-05-22 LAB — CBC
HCT: 40.1 % (ref 39.0–52.0)
Hemoglobin: 12.7 g/dL — ABNORMAL LOW (ref 13.0–17.0)
MCH: 31.8 pg (ref 26.0–34.0)
MCHC: 31.7 g/dL (ref 30.0–36.0)
MCV: 100.5 fL — ABNORMAL HIGH (ref 80.0–100.0)
Platelets: 151 10*3/uL (ref 150–400)
RBC: 3.99 MIL/uL — ABNORMAL LOW (ref 4.22–5.81)
RDW: 14.5 % (ref 11.5–15.5)
WBC: 14.4 10*3/uL — ABNORMAL HIGH (ref 4.0–10.5)
nRBC: 0 % (ref 0.0–0.2)

## 2018-05-22 LAB — BASIC METABOLIC PANEL
Anion gap: 8 (ref 5–15)
BUN: 16 mg/dL (ref 8–23)
CO2: 22 mmol/L (ref 22–32)
Calcium: 8.3 mg/dL — ABNORMAL LOW (ref 8.9–10.3)
Chloride: 106 mmol/L (ref 98–111)
Creatinine, Ser: 0.98 mg/dL (ref 0.61–1.24)
GFR calc Af Amer: >60 mL/min (ref >60)
GFR calc non Af Amer: >60 mL/min (ref >60)
Glucose, Bld: 98 mg/dL (ref 70–99)
Potassium: 4.4 mmol/L (ref 3.5–5.1)
Sodium: 136 mmol/L (ref 135–145)

## 2018-05-22 LAB — POCT I-STAT TROPONIN I: Troponin i, poc: 0.82 ng/mL (ref 0.00–0.08)

## 2018-05-22 LAB — BRAIN NATRIURETIC PEPTIDE: B Natriuretic Peptide: 83 pg/mL (ref 0.0–100.0)

## 2018-05-22 LAB — ECHOCARDIOGRAM COMPLETE
Height: 71 in
Weight: 4320 oz

## 2018-05-22 LAB — TROPONIN I
Troponin I: 0.75 ng/mL (ref <0.03)
Troponin I: 0.64 ng/mL (ref <0.03)

## 2018-05-22 MED ORDER — FUROSEMIDE 10 MG/ML IJ SOLN
40.0000 mg | Freq: Once | INTRAMUSCULAR | Status: AC
  Administered 2018-05-22: 40 mg via INTRAVENOUS
  Filled 2018-05-22: qty 4

## 2018-05-22 MED ORDER — ONDANSETRON HCL 4 MG/2ML IJ SOLN: 4.0000 mg | Freq: Four times a day (QID) | INTRAMUSCULAR | Status: DC | PRN

## 2018-05-22 MED ORDER — ATORVASTATIN CALCIUM 10 MG PO TABS
20.0000 mg | ORAL_TABLET | Freq: Two times a day (BID) | ORAL | Status: DC
  Administered 2018-05-22 – 2018-05-23 (×2): 20 mg via ORAL
  Filled 2018-05-22 (×2): qty 2

## 2018-05-22 MED ORDER — ACETAMINOPHEN 325 MG PO TABS
650.0000 mg | ORAL_TABLET | ORAL | Status: DC | PRN
  Administered 2018-05-22: 650 mg via ORAL
  Filled 2018-05-22: qty 2

## 2018-05-22 MED ORDER — METOPROLOL TARTRATE 50 MG PO TABS
50.0000 mg | ORAL_TABLET | Freq: Two times a day (BID) | ORAL | Status: DC
  Administered 2018-05-22 – 2018-05-23 (×2): 50 mg via ORAL
  Filled 2018-05-22 (×2): qty 1

## 2018-05-22 MED ORDER — MAGNESIUM HYDROXIDE 400 MG/5ML PO SUSP
30.0000 mL | Freq: Every evening | ORAL | Status: DC | PRN
  Administered 2018-05-22: 30 mL via ORAL
  Filled 2018-05-22: qty 30

## 2018-05-22 MED ORDER — NITROGLYCERIN 0.4 MG SL SUBL: 0.4000 mg | SUBLINGUAL_TABLET | SUBLINGUAL | Status: DC | PRN

## 2018-05-22 MED ORDER — APIXABAN 5 MG PO TABS
5.0000 mg | ORAL_TABLET | Freq: Two times a day (BID) | ORAL | Status: DC
  Administered 2018-05-23: 5 mg via ORAL
  Filled 2018-05-22 (×2): qty 1

## 2018-05-22 MED ORDER — ASPIRIN 81 MG PO CHEW
243.0000 mg | CHEWABLE_TABLET | Freq: Once | ORAL | Status: AC
  Administered 2018-05-22: 243 mg via ORAL
  Filled 2018-05-22: qty 3

## 2018-05-22 MED ORDER — SODIUM CHLORIDE 0.9% FLUSH
3.0000 mL | Freq: Once | INTRAVENOUS | Status: AC
  Administered 2018-05-22: 3 mL via INTRAVENOUS

## 2018-05-22 MED ORDER — PERFLUTREN LIPID MICROSPHERE
1.0000 mL | INTRAVENOUS | Status: AC | PRN
  Administered 2018-05-22: 2 mL via INTRAVENOUS
  Filled 2018-05-22: qty 10

## 2018-05-22 MED ORDER — ASPIRIN 81 MG PO CHEW: 81.0000 mg | CHEWABLE_TABLET | Freq: Every evening | ORAL | Status: DC

## 2018-05-22 NOTE — H&P (Signed)
Cardiology Admission History and Physical:   Patient ID: Ronald InglesRobert S Hull MRN: [id number]; DOB: 09/01/1945   Admission date: 05/22/2018  Primary Care Provider: Renford DillsPolite, Ronald, MD Primary Cardiologist: Tonny BollmanMichael Cooper, MD  Primary Electrophysiologist:  Will Jorja LoaMartin Camnitz, MD   Chief Complaint:  Chest pain  Patient Profile:   Ronald Hull is a 73 y.o. male with history of CAD with prior stenting to LCX and RCA, afib with ablation Jan 23,2020 presents with chest pain.   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 History of CAD with prior DES to LCX and RCA in 01/2018.   Reports chest heaviness and SOB starting 10pm last night. Worst with laying down. Not better with NG at home. Constant pain now at 12 hrs, though much improved in ER. Associated SOB as well. Has had some coughing, mild yellow productive sputum he attributes to being intubated for his ablation procedure.    WBC 14.4 Hgb 12.7 Plt 151 K 4.4 Cr 0.98  Trop 0.82--> CXR borderline cardiomegaly, mild congestion EKG SR, no acute ischemic changes   - 10/2019cath  mild to mod LAD disease FFR 0.89, severe LCX disease, severe RCA disease. Received DES to LCX and RCA.  - May 20 2018 afib ablation: succesful isolation of all 4 pulmonary veins, Additional left atrial ablation was performed with a standard box lesion created along the posterior wall of the left atrium. Atrial fibrillation successfully cardioverted to sinus rhythm. - 09/2015 echo 55-60%, indeterminant diastolic function, no WMAs    Past Medical History:  Diagnosis Date  . Arthritis   . CAD (coronary artery disease) 02/24/2018   LHC 10/19: pLAD 50, mLAD 50 (dFFR=0.89), oLCx 95, pRCA 30, mRCA 80 >> PCI: DES to LCx; DES to RCA // Xience28 Trial participant (will DC Plavix after 30 d and remain on ASA + Apixaban)  . Complication of  anesthesia    BP dropped post colonoscopy  . Hypercholesteremia   . Hypertension   . Persistent atrial fibrillation   . Sleep apnea    does use a cpap  . Wears glasses     Past Surgical History:  Procedure Laterality Date  . ATRIAL FIBRILLATION ABLATION N/A 05/20/2018   Procedure: ATRIAL FIBRILLATION ABLATION;  Surgeon: Regan Lemmingamnitz, Will Martin, MD;  [facility];  Service: Cardiovascular;  Laterality: N/A;  . CARDIOVERSION N/A 11/01/2015   Procedure: CARDIOVERSION;  Surgeon: Chrystie NoseKenneth C Hilty, MD;  [facility];  Service: Cardiovascular;  Laterality: N/A;  . CARDIOVERSION N/A 11/23/2015   Procedure: CARDIOVERSION;  Surgeon: Will Jorja LoaMartin Camnitz, MD;  [facility];  Service: Cardiovascular;  Laterality: N/A;  . CARDIOVERSION N/A 10/12/2017   Procedure: CARDIOVERSION;  Surgeon: Chrystie NoseHilty, Kenneth C, MD;  [facility];  Service: Cardiovascular;  Laterality: N/A;  . COLONOSCOPY    . CORONARY STENT INTERVENTION N/A 02/24/2018   Procedure: CORONARY STENT INTERVENTION;  Surgeon: Tonny Bollmanooper, Michael, MD;  [facility];  Service: Cardiovascular;  Laterality: N/A;  . INTRAVASCULAR PRESSURE WIRE/FFR STUDY N/A 02/24/2018   Procedure: INTRAVASCULAR PRESSURE WIRE/FFR STUDY;  Surgeon: Tonny Bollmanooper, Michael, MD;  [facility];  Service: Cardiovascular;  Laterality: N/A;  . KNEE ARTHROSCOPY  2000  . LEFT HEART CATH AND CORONARY ANGIOGRAPHY N/A 02/24/2018   Procedure: LEFT HEART CATH AND CORONARY ANGIOGRAPHY;  Surgeon: Tonny Bollmanooper, Michael, MD;  [facility];  Service: Cardiovascular;  Laterality: N/A;  . SHOULDER ARTHROSCOPY WITH ROTATOR CUFF REPAIR AND SUBACROMIAL DECOMPRESSION Right 07/15/2012   Procedure: RIGHT ARTHROSCOPY SHOULDER DECOMPRESSION  SUBACROMIAL PARTIAL ACROMIOPLASTY WITH CORACOACROMIAL RELEASE,  DISTAL CLAVICULECTOMY, resect biceps debride labrium WITH ROTATOR CUFF REPAIR;  Surgeon: [name], MD;  Location: [facility];  Service: Orthopedics;  Laterality: Right;     Medications Prior to Admission: Prior to Admission medications   Medication Sig Start Date End Date Taking? Authorizing Provider  acetaminophen (TYLENOL) 325 MG tablet Take 650 mg by mouth every 6 (six) hours as needed (for pain.).   Yes [provider]  ASPIRIN LOW DOSE 81 MG chewable tablet Chew 81 mg by mouth every evening.  02/25/18  Yes [provider]  atorvastatin (LIPITOR) 40 MG tablet Take 1 tablet (40 mg total) by mouth daily. Patient taking differently: Take 20 mg by mouth 2 (two) times daily.  02/22/18 05/23/18 Yes Weaver, Scott T, PA-C  ELIQUIS 5 MG TABS tablet TAKE 1 TABLET(5 MG) BY MOUTH TWICE DAILY Patient taking differently: Take 5 mg by mouth 2 (two) times daily.  12/15/17  Yes Camnitz, Andree Coss, MD  hydrocortisone cream 1 % Apply 1 application topically 2 (two) times daily as needed for itching.   Yes [provider]  metoprolol tartrate (LOPRESSOR) 50 MG tablet Take 1 tablet (50 mg total) by mouth 2 (two) times daily. 02/22/18 05/23/18 Yes Weaver, Scott T, PA-C  nitroGLYCERIN (NITROSTAT) 0.4 MG SL tablet Place 1 tablet (0.4 mg total) under the tongue every 5 (five) minutes as needed. 02/25/18  Yes Arty Baumgartner, NP     Allergies:    Allergies  Allergen Reactions  . Lisinopril Cough    Social History:   Social History   Socioeconomic History  . Marital status: Married    Spouse name: Not on file  . Number of children: Not on file  . Years of education: Not on file  . Highest education level: Not on file  Occupational History  . Not on file  Social Needs  . Financial resource strain: Not on file  . Food insecurity:    Worry: Not on file    Inability: Not on file  . Transportation needs:    Medical: Not on file    Non-medical: Not on file  Tobacco Use  . Smoking status: Former Smoker    Last attempt to quit: 07/09/1973    Years since quitting: 44.8  . Smokeless  tobacco: Never Used  Substance and Sexual Activity  . Alcohol use: Yes    Comment: beers daily  . Drug use: No  . Sexual activity: Not Currently    Birth control/protection: Abstinence  Lifestyle  . Physical activity:    Days per week: Not on file    Minutes per session: Not on file  . Stress: Not on file  Relationships  . Social connections:    Talks on phone: Not on file    Gets together: Not on file    Attends religious service: Not on file    Active member of club or organization: Not on file    Attends meetings of clubs or organizations: Not on file    Relationship status: Not on file  . Intimate partner violence:    Fear of current or ex partner: Not on file    Emotionally abused: Not on file    Physically abused: Not on file    Forced sexual activity: Not on file  Other Topics Concern  . Not on file  Social History Narrative  . Not on file    Family History:   The patient's family history includes Cancer in his sister; Clotting disorder in his mother; Heart disease in his brother; Heart failure in his father; Hypertension in his sister; Other in his mother.    ROS:  Please see the history of present illness.  All other ROS reviewed and negative.     Physical Exam/Data:   Vitals:   05/22/18 0806 05/22/18 0830 05/22/18 0900 05/22/18 0925  BP: 127/72 121/69 120/73 120/73  Pulse: 76 71 68 68  Resp: 17   17  Temp:      TempSrc:      SpO2: 99% 98% 99% 99%  Weight:      Height:       No intake or output data in the 24 hours ending 05/22/18 0951 Last 3 Weights 05/22/2018 05/21/2018 05/20/2018  Weight (lbs) 270 lb 275 lb 2.2 oz 265 lb  Weight (kg) 122.471 kg 124.8 kg 120.203 kg     Body mass index is 37.66 kg/m.  General:  Well nourished, well developed, in no acute distress HEENT: normal Lymph: no adenopathy Neck: noJVD Cardiac:  normal S1, S2; RRR; no murmur Lungs:  clear to auscultation bilaterally, no wheezing, rhonchi or rales  Abd: soft, nontender, no  hepatomegaly  Ext: mild bilateral edema Musculoskeletal:  No deformities, BUE and BLE strength normal and equal Skin: warm and dry  Neuro:  CNs 2-12 intact, no focal abnormalities noted Psych:  Normal affect     Laboratory Data:  Chemistry Recent Labs  Lab 05/22/18 0349  NA 136  K 4.4  CL 106  CO2 22  GLUCOSE 98  BUN 16  CREATININE 0.98  CALCIUM 8.3*  GFRNONAA >60  GFRAA >60  ANIONGAP 8    No results for input(s): PROT, ALBUMIN, AST, ALT, ALKPHOS, BILITOT in the last 168 hours. Hematology Recent Labs  Lab 05/22/18 0349  WBC 14.4*  RBC 3.99*  HGB 12.7*  HCT 40.1  MCV 100.5*  MCH 31.8  MCHC 31.7  RDW 14.5  PLT 151   Cardiac EnzymesNo results for input(s): TROPONINI in the last 168 hours.  Recent Labs  Lab 05/22/18 0355  TROPIPOC 0.82*    BNPNo results for input(s): BNP, PROBNP in the last 168 hours.  DDimer No results for input(s): DDIMER in the last 168 hours.  Radiology/Studies:  Dg Chest 2 View  Result Date: 05/22/2018 CLINICAL DATA:  Crushing chest pain on the left. EXAM: CHEST - 2 VIEW COMPARISON:  11/25/2012 FINDINGS: Top-normal heart size. Nonaneurysmal thoracic aorta. Mild central vascular congestion without alveolar consolidation. No pneumothorax or effusion. No acute osseous abnormality. IMPRESSION: Borderline cardiomegaly with mild central vascular congestion. Electronically Signed   By: Tollie Eth M.D.   On: 05/22/2018 03:20    Assessment and Plan:   1. Elevated troponin/SOB - initial trop 0.82, he is s/p afib ablation on Jan 23 - from literature review trop elevation after afib ablation is expected, one study showing the mean trop I 1 day after ablation up was 3 ng/ml - overall troponin elevation finding nonspecific, EKG without ischemic changes - cycle trop trend, obtain echo - suspect all related to recent ablation.   2. SOB - CXR with mild congestion, mild LE edema he reports is chronic. Due to SOB will give lasix IV 40mg  x 1.  - add  on BNP to labs - f/u echo  3. CAD - history of stents to RCA and LCX in 01/2018 - atypical chest pain lasting 12 hours, somewhat worst with position.  - current presentation not specific for ACS, follow trop trend  4. Afib - s/p ablation Jan 23 - EKG shows SR - he has been on eliquis and ASA. Had nosebleeds on triple therapy. He is part of Xience trial on ASA and eliquis.   5. Leukocytosis - no urinary or GI symptoms. Mild productive cough after his intubation for his ablation. No fevers, CXR mild congestion and no focal infiltrate - follow trend, if febrile pan culture. If productive cough progresses treat for bronchitis.    Plan for observation 24 hours. Awaiting bed at [facility].    For questions or updates, please contact [facility] Please consult [email or web address] for contact info under        Signed, Dina Rich, MD  05/22/2018 9:51 AM

## 2018-05-22 NOTE — ED Notes (Signed)
I gave critical IStat Troponin results to MD Molpus

## 2018-05-22 NOTE — ED Triage Notes (Signed)
Patient states he started having crushing pain on the left chest. Patient states that he was in the hospital for a procedure yesterday. Patient states it woke him up out of his sleep.

## 2018-05-22 NOTE — Progress Notes (Signed)
  Echocardiogram 2D Echocardiogram with definity has been performed.  Ronald Hull M 05/22/2018, 3:36 PM

## 2018-05-22 NOTE — ED Provider Notes (Signed)
[facility]   CSN: [id number] Arrival date & time: 05/22/18  0300     History   Chief Complaint Chief Complaint  Patient presents with  . Chest Pain    HPI Ronald Hull is a 73 y.o. male with a hx of arthritis, CAD, HTN, a-fib presents to the Emergency Department complaining of gradual, persistent, progressively worsening chest pain worsening around 10:30pm. Associated symptoms include SOB.  Pt reports taking nitro without relief and tylenol.  Pt reports a hx of cardiac stent 3 mos ago.  Pt also reports recent cardioversion and ablation Thursday morning by Dr. Elberta Fortisamnitz.  Pt was d/c home Friday morning. He reports his some chest pain throughout the day, but became much worse around 10pm.  He reports some SOB at that time, but increasing pressure and SOB throughout the night.  Pt reports he has been compliant with his medications including eliquis.  No alleviating factors.  Pt reports laying flat made the pain and SOB worse.  Pt denies fever, chills, headache, neck pain, neck stiffness, adb pain, N/V/D, weakness, syncope.      The history is provided by the patient and medical records. No language interpreter was used.    Past Medical History:  Diagnosis Date  . Arthritis   . CAD (coronary artery disease) 02/24/2018   LHC 10/19: pLAD 50, mLAD 50 (dFFR=0.89), oLCx 95, pRCA 30, mRCA 80 >> PCI: DES to LCx; DES to RCA // Xience28 Trial participant (will DC Plavix after 30 d and remain on ASA + Apixaban)  . Complication of anesthesia    BP dropped post colonoscopy  . Hypercholesteremia   . Hypertension   . Persistent atrial fibrillation   . Sleep apnea    does use a cpap  . Wears glasses     Patient Active Problem List   Diagnosis Date Noted  . Hyperlipidemia 03/12/2018  . Essential hypertension 03/12/2018  . CAD (coronary artery disease) 02/24/2018  . Persistent atrial fibrillation     Past Surgical History:  Procedure  Laterality Date  . ATRIAL FIBRILLATION ABLATION N/A 05/20/2018   Procedure: ATRIAL FIBRILLATION ABLATION;  Surgeon: Regan Lemmingamnitz, Will Martin, MD;  [facility];  Service: Cardiovascular;  Laterality: N/A;  . CARDIOVERSION N/A 11/01/2015   Procedure: CARDIOVERSION;  Surgeon: Chrystie NoseKenneth C Hilty, MD;  [facility];  Service: Cardiovascular;  Laterality: N/A;  . CARDIOVERSION N/A 11/23/2015   Procedure: CARDIOVERSION;  Surgeon: Will Jorja LoaMartin Camnitz, MD;  [facility];  Service: Cardiovascular;  Laterality: N/A;  . CARDIOVERSION N/A 10/12/2017   Procedure: CARDIOVERSION;  Surgeon: Chrystie NoseHilty, Kenneth C, MD;  [facility];  Service: Cardiovascular;  Laterality: N/A;  . COLONOSCOPY    . CORONARY STENT INTERVENTION N/A 02/24/2018   Procedure: CORONARY STENT INTERVENTION;  Surgeon: Tonny Bollmanooper, Michael, MD;  [facility];  Service: Cardiovascular;  Laterality: N/A;  . INTRAVASCULAR PRESSURE WIRE/FFR STUDY N/A 02/24/2018   Procedure: INTRAVASCULAR PRESSURE WIRE/FFR STUDY;  Surgeon: Tonny Bollmanooper, Michael, MD;  [facility];  Service: Cardiovascular;  Laterality: N/A;  . KNEE ARTHROSCOPY  2000  . LEFT HEART CATH AND CORONARY ANGIOGRAPHY N/A 02/24/2018   Procedure: LEFT HEART CATH AND CORONARY ANGIOGRAPHY;  Surgeon: Tonny Bollmanooper, Michael, MD;  [facility];  Service: Cardiovascular;  Laterality: N/A;  . SHOULDER ARTHROSCOPY WITH ROTATOR CUFF REPAIR AND SUBACROMIAL DECOMPRESSION Right 07/15/2012   Procedure: RIGHT ARTHROSCOPY SHOULDER DECOMPRESSION  SUBACROMIAL PARTIAL ACROMIOPLASTY WITH CORACOACROMIAL RELEASE,  DISTAL  CLAVICULECTOMY, resect biceps debride labrium WITH ROTATOR CUFF REPAIR;  Surgeon: [name], MD;  Location: [facility];  Service: Orthopedics;  Laterality: Right;        Home Medications    Prior to Admission medications   Medication Sig Start Date End Date Taking? Authorizing Provider  acetaminophen (TYLENOL)  325 MG tablet Take 650 mg by mouth every 6 (six) hours as needed (for pain.).   Yes [provider]  ASPIRIN LOW DOSE 81 MG chewable tablet Chew 81 mg by mouth every evening.  02/25/18  Yes [provider]  atorvastatin (LIPITOR) 40 MG tablet Take 1 tablet (40 mg total) by mouth daily. Patient taking differently: Take 20 mg by mouth 2 (two) times daily.  02/22/18 05/23/18 Yes Weaver, Scott T, PA-C  ELIQUIS 5 MG TABS tablet TAKE 1 TABLET(5 MG) BY MOUTH TWICE DAILY Patient taking differently: Take 5 mg by mouth 2 (two) times daily.  12/15/17  Yes Camnitz, Andree Coss, MD  hydrocortisone cream 1 % Apply 1 application topically 2 (two) times daily as needed for itching.   Yes [provider]  metoprolol tartrate (LOPRESSOR) 50 MG tablet Take 1 tablet (50 mg total) by mouth 2 (two) times daily. 02/22/18 05/23/18 Yes Weaver, Scott T, PA-C  nitroGLYCERIN (NITROSTAT) 0.4 MG SL tablet Place 1 tablet (0.4 mg total) under the tongue every 5 (five) minutes as needed. 02/25/18  Yes Arty Baumgartner, NP    Family History Family History  Problem Relation Age of Onset  . Heart failure Father   . Other Mother        bad fall  . Clotting disorder Mother   . Cancer Sister   . Hypertension Sister   . Heart disease Brother     Social History Social History   Tobacco Use  . Smoking status: Former Smoker    Last attempt to quit: 07/09/1973    Years since quitting: 44.8  . Smokeless tobacco: Never Used  Substance Use Topics  . Alcohol use: Yes    Comment: beers daily  . Drug use: No     Allergies   Lisinopril   Review of Systems Review of Systems  Constitutional: Negative for appetite change, diaphoresis, fatigue, fever and unexpected weight change.  HENT: Negative for mouth sores.   Eyes: Negative for visual disturbance.  Respiratory: Positive for chest tightness and shortness of breath. Negative for cough and wheezing.   Cardiovascular: Positive for chest pain.    Gastrointestinal: Negative for abdominal pain, constipation, diarrhea, nausea and vomiting.  Endocrine: Negative for polydipsia, polyphagia and polyuria.  Genitourinary: Negative for dysuria, frequency, hematuria and urgency.  Musculoskeletal: Negative for back pain and neck stiffness.  Skin: Negative for rash.  Allergic/Immunologic: Negative for immunocompromised state.  Neurological: Negative for syncope, light-headedness and headaches.  Hematological: Does not bruise/bleed easily.  Psychiatric/Behavioral: Negative for sleep disturbance. The patient is not nervous/anxious.      Physical Exam Updated Vital Signs BP 125/70 (BP Location: Left Arm)   Pulse 85   Temp 98.3 F (36.8 C) (Oral)   Resp (!) 23   Ht 5\' 11"  (1.803 m)   Wt 122.5 kg   SpO2 100%   BMI 37.66 kg/m   Physical Exam Vitals signs and nursing note reviewed.  Constitutional:      General: He is not in acute distress.    Appearance: He is well-developed. He is not diaphoretic.     Comments: Awake, alert, nontoxic appearance  HENT:  Head: Normocephalic and atraumatic.     Mouth/Throat:     Pharynx: No oropharyngeal exudate.  Eyes:     General: No scleral icterus.    Conjunctiva/sclera: Conjunctivae normal.  Neck:     Musculoskeletal: Normal range of motion and neck supple.  Cardiovascular:     Rate and Rhythm: Normal rate and regular rhythm.  Pulmonary:     Effort: Pulmonary effort is normal. No respiratory distress.     Breath sounds: Normal breath sounds. No wheezing, rhonchi or rales.  Abdominal:     General: Bowel sounds are normal.     Palpations: Abdomen is soft. There is no mass.     Tenderness: There is no abdominal tenderness. There is no guarding or rebound.  Musculoskeletal: Normal range of motion.  Skin:    General: Skin is warm and dry.  Neurological:     Mental Status: He is alert.     Comments: Speech is clear and goal oriented Moves extremities without ataxia      ED  Treatments / Results  Labs (all labs ordered are listed, but only abnormal results are displayed) Labs Reviewed  BASIC METABOLIC PANEL - Abnormal; Notable for the following components:      Result Value   Calcium 8.3 (*)    All other components within normal limits  CBC - Abnormal; Notable for the following components:   WBC 14.4 (*)    RBC 3.99 (*)    Hemoglobin 12.7 (*)    MCV 100.5 (*)    All other components within normal limits  POCT I-STAT TROPONIN I - Abnormal; Notable for the following components:   Troponin i, poc 0.82 (*)    All other components within normal limits  I-STAT TROPONIN, ED    EKG EKG Interpretation  Date/Time:  Saturday May 22 2018 03:06:52 EST Ventricular Rate:  75 PR Interval:    QRS Duration: 103 QT Interval:  369 QTC Calculation: 413 R Axis:   16 Text Interpretation:  Sinus rhythm Normal ECG No significant change was found Confirmed by Paula Libra ([id number]) on 05/22/2018 3:09:49 AM   Radiology Dg Chest 2 View  Result Date: 05/22/2018 CLINICAL DATA:  Crushing chest pain on the left. EXAM: CHEST - 2 VIEW COMPARISON:  11/25/2012 FINDINGS: Top-normal heart size. Nonaneurysmal thoracic aorta. Mild central vascular congestion without alveolar consolidation. No pneumothorax or effusion. No acute osseous abnormality. IMPRESSION: Borderline cardiomegaly with mild central vascular congestion. Electronically Signed   By: Tollie Eth M.D.   On: 05/22/2018 03:20    Procedures .Critical Care Performed by: Dierdre Forth, PA-C Authorized by: Dierdre Forth, PA-C   Critical care provider statement:    Critical care time (minutes):  45   Critical care time was exclusive of:  Separately billable procedures and treating other patients and teaching time   Critical care was necessary to treat or prevent imminent or life-threatening deterioration of the following conditions:  Cardiac failure   Critical care was time spent personally by me on the  following activities:  Discussions with consultants, evaluation of patient's response to treatment, examination of patient, ordering and performing treatments and interventions, ordering and review of laboratory studies, ordering and review of radiographic studies, pulse oximetry, re-evaluation of patient's condition, obtaining history from patient or surrogate and review of old charts   I assumed direction of critical care for this patient from another provider in my specialty: yes     (including critical care time)  Medications Ordered in ED Medications  sodium chloride flush (NS) 0.9 % injection 3 mL (has no administration in time range)  aspirin chewable tablet 243 mg (243 mg Oral Given 05/22/18 0456)     Initial Impression / Assessment and Plan / ED Course  I have reviewed the triage vital signs and the nursing notes.  Pertinent labs & imaging results that were available during my care of the patient were reviewed by me and considered in my medical decision making (see chart for details).  Clinical Course as of May 22 618  Sat May 22, 2018  0444 Pt reports last ASA dose was 8pm.  He took 81mg  at that time   [HM]  0600 Multiple attempts to contact Cardiology without call back.   [HM]    Clinical Course User Index [HM] Allesandra Huebsch, Boyd KerbsHannah, PA-C    Presents to the emergency department with several hours of worsening chest pain with associated shortness of breath.  Patient with elevated troponin here in the emergency room at 0.82.  EKG is without acute abnormality including no ischemia and no ST elevation.  Other labs are reassuring.  CBC does show leukocytosis however this is to be expected in the setting of recent ablation.  Patient has no infectious symptoms including fever, chills, cough, congestion.  Given additional aspirin to equal full dose.  Multiple attempts to contact cardiology without response.  I have personally paged the physician 2 times in addition to the attempts by  our secretary.  I do not want to heparinize patient at this time until I have discussed the situation with them.  The patient was discussed with and seen by Dr. Read DriversMolpus who agrees with the treatment plan as we await cardiology.    At shift change care was transferred to Glenford BayleyAlex Law, PA-C who will discuss with cardiology and determine disposition.     Final Clinical Impressions(s) / ED Diagnoses   Final diagnoses:  Elevated troponin I level  Chest pain, unspecified type  Shortness of breath    ED Discharge Orders    None       Mardene SayerMuthersbaugh, Boyd KerbsHannah, PA-C 05/22/18 [id number]    Molpus, Jonny RuizJohn, MD 05/22/18 [phone]

## 2018-05-22 NOTE — ED Notes (Signed)
ED TO INPATIENT HANDOFF REPORT  Name/Age/Gender [name] 73 y.o. male  Code Status Code Status History    Date Active Date Inactive Code Status Order ID Comments User Context   05/20/2018 1204 05/21/2018 1223 Full Code [id number]  Regan Lemming, MD Inpatient   02/24/2018 1748 02/25/2018 1346 Full Code [id number]  Tonny Bollman, MD Inpatient    Advance Directive Documentation     Most Recent Value  Type of Advance Directive  Healthcare Power of Attorney, Living will  Pre-existing out of facility DNR order (yellow form or pink MOST form)  -  "MOST" Form in Place?  -      Home/SNF/Other Home  Chief Complaint chest pain  Level of Care/Admitting Diagnosis ED Disposition    ED Disposition Condition Comment   Admit  Hospital Area: [facility] [[id number]]  Level of Care: Cardiac Telemetry [103]  Diagnosis: Chest pain [245809]  Admitting Physician: Antoine Poche [[id number]]  Attending Physician: Antoine Poche [phone]  PT Class (Do Not Modify): Observation [104]  PT Acc Code (Do Not Modify): Observation [10022]       Medical History Past Medical History:  Diagnosis Date  . Arthritis   . CAD (coronary artery disease) 02/24/2018   LHC 10/19: pLAD 50, mLAD 50 (dFFR=0.89), oLCx 95, pRCA 30, mRCA 80 >> PCI: DES to LCx; DES to RCA // Xience28 Trial participant (will DC Plavix after 30 d and remain on ASA + Apixaban)  . Complication of anesthesia    BP dropped post colonoscopy  . Hypercholesteremia   . Hypertension   . Persistent atrial fibrillation   . Sleep apnea    does use a cpap  . Wears glasses     Allergies Allergies  Allergen Reactions  . Lisinopril Cough    IV Location/Drains/Wounds Patient Lines/Drains/Airways Status   Active Line/Drains/Airways    Name:   Placement date:   Placement time:   Site:   Days:   Peripheral IV 05/22/18 Right Hand   05/22/18    0348    Hand   less than 1          Labs/Imaging Results for  orders placed or performed during the hospital encounter of 05/22/18 (from the past 48 hour(s))  Basic metabolic panel     Status: Abnormal   Collection Time: 05/22/18  3:49 AM  Result Value Ref Range   Sodium 136 135 - 145 mmol/L   Potassium 4.4 3.5 - 5.1 mmol/L   Chloride 106 98 - 111 mmol/L   CO2 22 22 - 32 mmol/L   Glucose, Bld 98 70 - 99 mg/dL   BUN 16 8 - 23 mg/dL   Creatinine, Ser 3.41 0.61 - 1.24 mg/dL   Calcium 8.3 (L) 8.9 - 10.3 mg/dL   GFR calc non Af Amer >60 >60 mL/min   GFR calc Af Amer >60 >60 mL/min   Anion gap 8 5 - 15    Comment: Performed at [facility], [address], [place], [place] [address]  CBC     Status: Abnormal   Collection Time: 05/22/18  3:49 AM  Result Value Ref Range   WBC 14.4 (H) 4.0 - 10.5 K/uL   RBC 3.99 (L) 4.22 - 5.81 MIL/uL   Hemoglobin 12.7 (L) 13.0 - 17.0 g/dL   HCT 24.0 97.3 - 53.2 %   MCV 100.5 (H) 80.0 - 100.0 fL   MCH 31.8 26.0 - 34.0 pg   MCHC 31.7 30.0 -  36.0 g/dL   RDW 63.8 46.6 - 59.9 %   Platelets 151 150 - 400 K/uL   nRBC 0.0 0.0 - 0.2 %    Comment: Performed at [facility], [address], [place], [place] [address]  Brain natriuretic peptide     Status: None   Collection Time: 05/22/18  3:49 AM  Result Value Ref Range   B Natriuretic Peptide 83.0 0.0 - 100.0 pg/mL    Comment: Performed at [facility], [address], [place], [place] [address]  POCT i-Stat troponin I     Status: Abnormal   Collection Time: 05/22/18  3:55 AM  Result Value Ref Range   Troponin i, poc 0.82 (HH) 0.00 - 0.08 ng/mL   Comment NOTIFIED PHYSICIAN    Comment 3            Comment: Due to the release kinetics of cTnI, a negative result within the first hours of the onset of symptoms does not rule out myocardial infarction with certainty. If myocardial infarction is still suspected, repeat the test at appropriate intervals.   Troponin I - Now Then Q6H     Status: Abnormal   Collection  Time: 05/22/18 10:55 AM  Result Value Ref Range   Troponin I 0.75 (HH) <0.03 ng/mL    Comment: CRITICAL RESULT CALLED TO, READ BACK BY AND VERIFIED WITH: CLAPP,S. RN AT 1150 05/22/18 MULLINS,T Performed at [facility], [address] [place], [place] [address]    Dg Chest 2 View  Result Date: 05/22/2018 CLINICAL DATA:  Crushing chest pain on the left. EXAM: CHEST - 2 VIEW COMPARISON:  11/25/2012 FINDINGS: Top-normal heart size. Nonaneurysmal thoracic aorta. Mild central vascular congestion without alveolar consolidation. No pneumothorax or effusion. No acute osseous abnormality. IMPRESSION: Borderline cardiomegaly with mild central vascular congestion. Electronically Signed   By: Tollie Eth M.D.   On: 05/22/2018 03:20   EKG Interpretation  Date/Time:  Saturday May 22 2018 03:06:52 EST Ventricular Rate:  75 PR Interval:    QRS Duration: 103 QT Interval:  369 QTC Calculation: 413 R Axis:   16 Text Interpretation:  Sinus rhythm Normal ECG No significant change was found Confirmed by Paula Libra ([id number]) on 05/22/2018 3:09:49 AM Also confirmed by Paula Libra ([id number]), editor Lanae Boast [phone])  on 05/22/2018 11:41:31 AM   Pending Labs Unresulted Labs (From admission, onward)    Start     Ordered   05/22/18 0942  Troponin I - Now Then Q6H  Now then every 6 hours,   R     05/22/18 0942   Signed and Held  Basic metabolic panel  Tomorrow morning,   R     Signed and Held   Signed and Held  CBC  Tomorrow morning,   R     Signed and Held          Vitals/Pain Today's Vitals   05/22/18 1056 05/22/18 1100 05/22/18 1306 05/22/18 1459  BP: 123/68 130/85 132/85 130/78  Pulse: 64 63 66 64  Resp: 16 17 16 18   Temp:      TempSrc:      SpO2: 96% 97% 97% 98%  Weight:      Height:      PainSc:        Isolation Precautions No active isolations  Medications Medications  perflutren lipid microspheres (DEFINITY) IV suspension (has no administration in time  range)  sodium chloride flush (NS) 0.9 % injection 3 mL (3  mLs Intravenous Given 05/22/18 1057)  aspirin chewable tablet 243 mg (243 mg Oral Given 05/22/18 0456)  furosemide (LASIX) injection 40 mg (40 mg Intravenous Given 05/22/18 1056)    Mobility walks

## 2018-05-22 NOTE — ED Provider Notes (Signed)
Signout from [employer], PA-C at shift change See previous provider note for full H&P  Briefly, patient presents with chest pain and shortness of breath following atrial fibrillation ablation Thursday morning by Dr. Elberta Fortis.  Patient also had cardiac stents replaced 3 months ago.  He was discharged from the hospital on 02/18/2019.  He reports his chest pain began around 10 PM and it was so bad he had trouble lying down.  Nitroglycerin did not resolve it.  Patient's troponin is elevated to 0.82.  He does not appear fluid overloaded.  Patient took 81 mg of aspirin prior to arrival and was completed here.  At shift change, waiting on cardiology consult.  I spoke with Dr. Aron Baba, cardiology fellow, who recommends admission to their service at [facility] for cardiology evaluation and work-up.  Patient will be transferred via [facility].  Patient understands and agrees with plan.  He continues to have no pain, however is having shortness of breath.  Oxygen saturations at 95%, but patient placed on 2 L nasal cannula for comfort.    Emi Holes, PA-C 05/22/18 3295    Paula Libra, MD 05/22/18 2240

## 2018-05-22 NOTE — ED Notes (Signed)
Date and time results received: 05/22/18 1150 (use smartphrase ".now" to insert current time)  Test: Trop Critical Value: 0.75  Name of Provider Notified: Kathlene November, RN  Orders Received? Or Actions Taken?: Actions Taken: Occupational hygienist

## 2018-05-23 ENCOUNTER — Encounter (HOSPITAL_COMMUNITY): Payer: Self-pay | Admitting: Cardiology

## 2018-05-23 DIAGNOSIS — R0789 Other chest pain: Secondary | ICD-10-CM | POA: Diagnosis not present

## 2018-05-23 DIAGNOSIS — G8918 Other acute postprocedural pain: Secondary | ICD-10-CM | POA: Diagnosis not present

## 2018-05-23 DIAGNOSIS — R7989 Other specified abnormal findings of blood chemistry: Secondary | ICD-10-CM | POA: Diagnosis not present

## 2018-05-23 DIAGNOSIS — I4819 Other persistent atrial fibrillation: Secondary | ICD-10-CM | POA: Diagnosis not present

## 2018-05-23 DIAGNOSIS — I2510 Atherosclerotic heart disease of native coronary artery without angina pectoris: Secondary | ICD-10-CM | POA: Diagnosis not present

## 2018-05-23 DIAGNOSIS — J40 Bronchitis, not specified as acute or chronic: Secondary | ICD-10-CM

## 2018-05-23 HISTORY — DX: Bronchitis, not specified as acute or chronic: J40

## 2018-05-23 LAB — BASIC METABOLIC PANEL
Anion gap: 10 (ref 5–15)
BUN: 14 mg/dL (ref 8–23)
CO2: 27 mmol/L (ref 22–32)
Calcium: 8.2 mg/dL — ABNORMAL LOW (ref 8.9–10.3)
Chloride: 102 mmol/L (ref 98–111)
Creatinine, Ser: 1.02 mg/dL (ref 0.61–1.24)
GFR calc Af Amer: >60 mL/min (ref >60)
GFR calc non Af Amer: >60 mL/min (ref >60)
Glucose, Bld: 102 mg/dL — ABNORMAL HIGH (ref 70–99)
Potassium: 4.3 mmol/L (ref 3.5–5.1)
Sodium: 139 mmol/L (ref 135–145)

## 2018-05-23 LAB — CBC
HCT: 37 % — ABNORMAL LOW (ref 39.0–52.0)
Hemoglobin: 12.1 g/dL — ABNORMAL LOW (ref 13.0–17.0)
MCH: 31.5 pg (ref 26.0–34.0)
MCHC: 32.7 g/dL (ref 30.0–36.0)
MCV: 96.4 fL (ref 80.0–100.0)
Platelets: 136 10*3/uL — ABNORMAL LOW (ref 150–400)
RBC: 3.84 MIL/uL — ABNORMAL LOW (ref 4.22–5.81)
RDW: 14.5 % (ref 11.5–15.5)
WBC: 11 10*3/uL — ABNORMAL HIGH (ref 4.0–10.5)
nRBC: 0 % (ref 0.0–0.2)

## 2018-05-23 LAB — TROPONIN I: Troponin I: 0.53 ng/mL (ref <0.03)

## 2018-05-23 MED ORDER — AZITHROMYCIN 250 MG PO TABS: ORAL_TABLET | ORAL | 0 refills | Status: AC

## 2018-05-23 NOTE — Progress Notes (Signed)
      Patient seen and discussed with PA Annie Paras, I agree with her documentation. Presented with chest pain and elevated troponin after recent discharge for afib ablation.  Trop elevation has trended down, EKG SR without ischemic changes. Echo with LVEF 60-65%, no WMAs, no significant effusion. Presentation consistent overall with postablation chest pain and trop elevation. Leukocytosis resolving, afebrile, ongoing productive cough, with give 5 day course of azithromycin. Discharge home today        Signed, Dina Rich, MD  05/23/2018, 10:36 AM

## 2018-05-23 NOTE — Progress Notes (Signed)
Progress Note  Patient Name: Ronald Hull Date of Encounter: 05/23/2018  Primary Cardiologist: Tonny Bollman, MD  EP Dr. Elberta Fortis   Subjective   Chest pain resolved. Some SOB and occ wheeze   Inpatient Medications    Scheduled Meds: . apixaban  5 mg Oral BID  . aspirin  81 mg Oral QPM  . atorvastatin  20 mg Oral BID  . metoprolol tartrate  50 mg Oral BID   Continuous Infusions:  PRN Meds: acetaminophen, magnesium hydroxide, nitroGLYCERIN, ondansetron (ZOFRAN) IV   Vital Signs    Vitals:   05/22/18 1908 05/22/18 1931 05/23/18 0552 05/23/18 0928  BP: 105/60  119/72 (!) 116/52  Pulse:  89 78 79  Resp:      Temp:  98.4 F (36.9 C) 98 F (36.7 C)   TempSrc:  Oral Oral   SpO2:  96% 92%   Weight:   123.4 kg   Height:        Intake/Output Summary (Last 24 hours) at 05/23/2018 1023 Last data filed at 05/23/2018 0830 Gross per 24 hour  Intake 600 ml  Output 450 ml  Net 150 ml   Last 3 Weights 05/23/2018 05/22/2018 05/21/2018  Weight (lbs) 272 lb 270 lb 275 lb 2.2 oz  Weight (kg) 123.378 kg 122.471 kg 124.8 kg      Telemetry    SR - Personally Reviewed  ECG    SR normal EKG - Personally Reviewed  Physical Exam   GEN: No acute distress.   Neck: No JVD Cardiac: RRR, no murmurs, rubs, or gallops.  Respiratory: Clear to auscultation bilaterally. GI: Soft, nontender, non-distended  MS: No edema; No deformity. Neuro:  Nonfocal  Psych: Normal affect   Labs    Chemistry Recent Labs  Lab 05/22/18 0349 05/23/18 0123  NA 136 139  K 4.4 4.3  CL 106 102  CO2 22 27  GLUCOSE 98 102*  BUN 16 14  CREATININE 0.98 1.02  CALCIUM 8.3* 8.2*  GFRNONAA >60 >60  GFRAA >60 >60  ANIONGAP 8 10     Hematology Recent Labs  Lab 05/22/18 0349 05/23/18 0123  WBC 14.4* 11.0*  RBC 3.99* 3.84*  HGB 12.7* 12.1*  HCT 40.1 37.0*  MCV 100.5* 96.4  MCH 31.8 31.5  MCHC 31.7 32.7  RDW 14.5 14.5  PLT 151 136*    Cardiac Enzymes Recent Labs  Lab 05/22/18 1055  05/22/18 2006 05/23/18 0123  TROPONINI 0.75* 0.64* 0.53*    Recent Labs  Lab 05/22/18 0355  TROPIPOC 0.82*     BNP Recent Labs  Lab 05/22/18 0349  BNP 83.0     DDimer No results for input(s): DDIMER in the last 168 hours.   Radiology    Dg Chest 2 View  Result Date: 05/22/2018 CLINICAL DATA:  Crushing chest pain on the left. EXAM: CHEST - 2 VIEW COMPARISON:  11/25/2012 FINDINGS: Top-normal heart size. Nonaneurysmal thoracic aorta. Mild central vascular congestion without alveolar consolidation. No pneumothorax or effusion. No acute osseous abnormality. IMPRESSION: Borderline cardiomegaly with mild central vascular congestion. Electronically Signed   By: Tollie Eth M.D.   On: 05/22/2018 03:20    Cardiac Studies   ECHO 05/22/18  Study Conclusions  - Procedure narrative: Transthoracic echocardiography. Image   quality was poor. The study was technically difficult, as a   result of poor acoustic windows. Intravenous contrast (Definity)   was administered. - Left ventricle: The cavity size was normal. Wall thickness was   increased in a  pattern of mild LVH. Systolic function was normal.   The estimated ejection fraction was in the range of 60% to 65%.   Wall motion was normal; there were no regional wall motion   abnormalities. Doppler parameters are consistent with restrictive   physiology, indicative of decreased left ventricular diastolic   compliance and/or increased left atrial pressure. Doppler   parameters are consistent with high ventricular filling pressure. - Aortic valve: Mildly calcified annulus. Trileaflet. There was   mild regurgitation. - Mitral valve: Moderately calcified annulus. Normal thickness   leaflets . There was mild regurgitation. - Tricuspid valve: There was mild regurgitation. - Systemic veins: IVC poorly visualized but appears dilated.   Patient Profile     73 y.o. male with history of CAD with prior stenting to LCX and RCA, afib with  ablation Jan 23,2020 now admitted with chest pain.  Assessment & Plan    Elevated troponin and SOB --troponin 0.82; 0.75; 0.64; and 0.53  with a fib ablation jan 23rd,  May be elevated from ablation alone.  EKG without acute changes.  Echo with poor quality but normal Ef and no RWMA.  No pericardial effusion. --pain may be due to ablation.   SOB with mild congestion on CXR and elevated WBC.  IV lasix given BNP was normal.  Pt + 150 since admit.    CAD with stents to RCA and LCX in 01/2018  Now atypical chest pain.    A fib  S/p ablation 05/20/18.  Maintaining SR.  On eliquis and ASA part of Xience trial.    Leukocytosis today 11 WBC down from 14.4 ,  plts at 136 today   Pt is afebrile.  -occ upper airway wheeze   Possible discharge today.    For questions or updates, please contact [facility] Please consult [email or web address] for contact info under        Signed, Nada Boozer, NP  05/23/2018, 10:23 AM

## 2018-05-23 NOTE — Care Management CC44 (Signed)
Condition Code 44 Documentation Completed  Patient Details  Name: ZAKARIYE PERSAD MRN: [id number] Date of Birth: May 02, 1945   Condition Code 44 given:  Yes Patient signature on Condition Code 44 notice:  Yes Documentation of 2 MD's agreement:  Yes Code 44 added to claim:  Yes    Deveron Furlong, RN 05/23/2018, 11:46 AM

## 2018-05-23 NOTE — Discharge Instructions (Signed)
Call if continued problems  z pak called in to [facility]  Heart Healthy diet.   Follow discharge instructions from the 24th as well. See that AVS.

## 2018-05-23 NOTE — Discharge Summary (Signed)
Discharge Summary    Patient ID: Ronald Hull MRN: [id number]; DOB: 1945-06-17  Admit date: 05/22/2018 Discharge date: 05/23/2018  Primary Care Provider: Renford Dills, MD  Primary Cardiologist: Tonny Bollman, MD  Primary Electrophysiologist:  Regan Lemming, MD   Discharge Diagnoses    Principal Problem:   Chest pain secondary to ablation, with expected mild elevation in troponin Active Problems:   Persistent atrial fibrillation   CAD (coronary artery disease)   Hyperlipidemia   Essential hypertension   Bronchitis   Allergies Allergies  Allergen Reactions  . Lisinopril Cough    Diagnostic Studies/Procedures    Echo 05/22/18 Study Conclusions  - Procedure narrative: Transthoracic echocardiography. Image   quality was poor. The study was technically difficult, as a   result of poor acoustic windows. Intravenous contrast (Definity)   was administered. - Left ventricle: The cavity size was normal. Wall thickness was   increased in a pattern of mild LVH. Systolic function was normal.   The estimated ejection fraction was in the range of 60% to 65%.   Wall motion was normal; there were no regional wall motion   abnormalities. Doppler parameters are consistent with restrictive   physiology, indicative of decreased left ventricular diastolic   compliance and/or increased left atrial pressure. Doppler   parameters are consistent with high ventricular filling pressure. - Aortic valve: Mildly calcified annulus. Trileaflet. There was   mild regurgitation. - Mitral valve: Moderately calcified annulus. Normal thickness   leaflets . There was mild regurgitation. - Tricuspid valve: There was mild regurgitation. - Systemic veins: IVC poorly visualized but appears dilated.  _____________   History of Present Illness     73 year old male with hx of CAD and stents to LCX and RCA 01/2018, and recent a fib ablation 05/20/18 presented for chest pain.  He has hx of prior  DCCVs and amiodarone failure.  He had ablation and was discharged 05/21/18.   He began having chest heaviness and SOB that increased with laying down.  Tried NTG without much relief.  Pain persisted and he came to ER.  He did have cough mild yellow mucus.  In ER his WBC was 14.4 and some congestion on CXR.Marland Kitchen  EKG was stable SR .  Troponin was elevated 0.82; 0.75; 0.64; and 0.53.  Please note per literature tropnonins may rise to 3 post ablation.  He was admitted and serial troponins done.  Also with IV lasix.    Hospital Course     Consultants:  none  Today troponin rise again thought to be related to ablation.  His pain has resolved.  He does feel SOB at times and tightness in throat, he believes to bronchitis.  No fever.  He has been seen and evaluated by Dr. Wyline Mood and found stable for discharge.  We will add z pak for home use.  His WBC is decreasing.   _____________  Discharge Vitals Blood pressure (!) 116/52, pulse 79, temperature 98 F (36.7 C), temperature source Oral, resp. rate 18, height 5\' 11"  (1.803 m), weight 123.4 kg, SpO2 92 %.  Filed Weights   05/22/18 0316 05/23/18 0552  Weight: 122.5 kg 123.4 kg    Labs & Radiologic Studies    CBC Recent Labs    05/22/18 0349 05/23/18 0123  WBC 14.4* 11.0*  HGB 12.7* 12.1*  HCT 40.1 37.0*  MCV 100.5* 96.4  PLT 151 136*   Basic Metabolic Panel Recent Labs    88/91/69 0349 05/23/18 0123  NA 136  139  K 4.4 4.3  CL 106 102  CO2 22 27  GLUCOSE 98 102*  BUN 16 14  CREATININE 0.98 1.02  CALCIUM 8.3* 8.2*   Liver Function Tests No results for input(s): AST, ALT, ALKPHOS, BILITOT, PROT, ALBUMIN in the last 72 hours. No results for input(s): LIPASE, AMYLASE in the last 72 hours. Cardiac Enzymes Recent Labs    05/22/18 1055 05/22/18 2006 05/23/18 0123  TROPONINI 0.75* 0.64* 0.53*   BNP Invalid input(s): POCBNP D-Dimer No results for input(s): DDIMER in the last 72 hours. Hemoglobin A1C No results for input(s):  HGBA1C in the last 72 hours. Fasting Lipid Panel No results for input(s): CHOL, HDL, LDLCALC, TRIG, CHOLHDL, LDLDIRECT in the last 72 hours. Thyroid Function Tests No results for input(s): TSH, T4TOTAL, T3FREE, THYROIDAB in the last 72 hours.  Invalid input(s): FREET3 _____________  Dg Chest 2 View  Result Date: 05/22/2018 CLINICAL DATA:  Crushing chest pain on the left. EXAM: CHEST - 2 VIEW COMPARISON:  11/25/2012 FINDINGS: Top-normal heart size. Nonaneurysmal thoracic aorta. Mild central vascular congestion without alveolar consolidation. No pneumothorax or effusion. No acute osseous abnormality. IMPRESSION: Borderline cardiomegaly with mild central vascular congestion. Electronically Signed   By: Tollie Eth M.D.   On: 05/22/2018 03:20   Disposition   Pt is being discharged home today in good condition.  Follow-up Plans & Appointments   Call if continued problems  z pak called in to [facility]  Heart Healthy diet.   Follow discharge instructions from the 24th as well. See that AVS.    Follow-up Information    Newman Nip, NP Follow up on 06/15/2018.   Specialties:  Nurse Practitioner, Cardiology Why:  at 9:30 AM in a fib clinic Contact information: [address] [place] [address] [phone]        Tereso Newcomer T, PA-C Follow up on 06/16/2018.   Specialties:  Cardiology, Physician Assistant Why:  at 2:15 Pm  Contact information: [address] [place] [address] [phone]            Discharge Medications   Allergies as of 05/23/2018      Reactions   Lisinopril Cough      Medication List    TAKE these medications   acetaminophen 325 MG tablet Commonly known as:  TYLENOL Take 650 mg by mouth every 6 (six) hours as needed (for pain.).   ASPIRIN LOW DOSE 81 MG chewable tablet Generic drug:  aspirin Chew 81 mg by mouth every evening.   atorvastatin 40 MG tablet Commonly known as:  LIPITOR Take 1 tablet (40 mg total) by  mouth daily. What changed:    how much to take  when to take this   azithromycin 250 MG tablet Commonly known as:  ZITHROMAX Z-PAK Take 2 tablets (500 mg) on  Day 1,  followed by 1 tablet (250 mg) once daily on Days 2 through 5.   ELIQUIS 5 MG Tabs tablet Generic drug:  apixaban TAKE 1 TABLET(5 MG) BY MOUTH TWICE DAILY What changed:  See the new instructions.   hydrocortisone cream 1 % Apply 1 application topically 2 (two) times daily as needed for itching.   metoprolol tartrate 50 MG tablet Commonly known as:  LOPRESSOR Take 1 tablet (50 mg total) by mouth 2 (two) times daily.   nitroGLYCERIN 0.4 MG SL tablet Commonly known as:  NITROSTAT Place 1 tablet (0.4 mg total) under the tongue every 5 (five) minutes as needed.  Acute coronary syndrome (MI, NSTEMI, STEMI, etc) this admission?: No.    Outstanding Labs/Studies   none  Duration of Discharge Encounter   Greater than 30 minutes including physician time.  Signed, Nada Boozer, NP 05/23/2018, 11:02 AM

## 2018-05-24 ENCOUNTER — Encounter (HOSPITAL_COMMUNITY): Payer: Medicare Other

## 2018-05-24 ENCOUNTER — Ambulatory Visit (HOSPITAL_COMMUNITY): Payer: Medicare Other

## 2018-05-26 ENCOUNTER — Encounter (HOSPITAL_COMMUNITY): Payer: Medicare Other

## 2018-05-26 ENCOUNTER — Ambulatory Visit (HOSPITAL_COMMUNITY): Payer: Medicare Other

## 2018-05-28 ENCOUNTER — Ambulatory Visit (HOSPITAL_COMMUNITY): Payer: Medicare Other

## 2018-05-28 ENCOUNTER — Encounter (HOSPITAL_COMMUNITY): Payer: Medicare Other

## 2018-05-31 ENCOUNTER — Ambulatory Visit (HOSPITAL_COMMUNITY): Payer: Medicare Other

## 2018-05-31 ENCOUNTER — Encounter (HOSPITAL_COMMUNITY)
Admission: RE | Admit: 2018-05-31 | Discharge: 2018-05-31 | Disposition: A | Discharge status: Home / Self Care | Payer: Medicare Other | Source: Ambulatory Visit | Attending: Cardiovascular Disease | Admitting: Cardiovascular Disease

## 2018-05-31 DIAGNOSIS — Z955 Presence of coronary angioplasty implant and graft: Secondary | ICD-10-CM

## 2018-06-02 ENCOUNTER — Ambulatory Visit (HOSPITAL_COMMUNITY): Payer: Medicare Other

## 2018-06-02 ENCOUNTER — Encounter (HOSPITAL_COMMUNITY)
Admission: RE | Admit: 2018-06-02 | Discharge: 2018-06-02 | Disposition: A | Discharge status: Home / Self Care | Payer: Medicare Other | Source: Ambulatory Visit | Attending: Cardiovascular Disease | Admitting: Cardiovascular Disease

## 2018-06-02 DIAGNOSIS — Z955 Presence of coronary angioplasty implant and graft: Secondary | ICD-10-CM

## 2018-06-03 NOTE — Progress Notes (Signed)
Cardiac Individual Treatment Plan  Patient Details  Name: Ronald Hull MRN: [id number] Date of Birth: [birth year] Referring Provider:     CARDIAC REHAB PHASE II ORIENTATION from 05/06/2018 in [facility] CARDIAC [facility]  Referring Provider  Tonny Bollman MD       Initial Encounter Date:    CARDIAC REHAB PHASE II ORIENTATION from 05/06/2018 in [facility] CARDIAC REHAB  Date  05/06/18      Visit Diagnosis: S/P drug eluting coronary stent placement  Patient's Home Medications on Admission:  Current Outpatient Medications:  .  acetaminophen (TYLENOL) 325 MG tablet, Take 650 mg by mouth every 6 (six) hours as needed (for pain.)., Disp: , Rfl:  .  ASPIRIN LOW DOSE 81 MG chewable tablet, Chew 81 mg by mouth every evening. , Disp: , Rfl: 0 .  atorvastatin (LIPITOR) 40 MG tablet, Take 1 tablet (40 mg total) by mouth daily. (Patient taking differently: Take 20 mg by mouth 2 (two) times daily. ), Disp: 90 tablet, Rfl: 3 .  ELIQUIS 5 MG TABS tablet, TAKE 1 TABLET(5 MG) BY MOUTH TWICE DAILY (Patient taking differently: Take 5 mg by mouth 2 (two) times daily. ), Disp: 60 tablet, Rfl: 5 .  hydrocortisone cream 1 %, Apply 1 application topically 2 (two) times daily as needed for itching., Disp: , Rfl:  .  metoprolol tartrate (LOPRESSOR) 50 MG tablet, Take 1 tablet (50 mg total) by mouth 2 (two) times daily., Disp: 180 tablet, Rfl: 3 .  nitroGLYCERIN (NITROSTAT) 0.4 MG SL tablet, Place 1 tablet (0.4 mg total) under the tongue every 5 (five) minutes as needed., Disp: 25 tablet, Rfl: 2  Past Medical History: Past Medical History:  Diagnosis Date  . Arthritis   . Bronchitis 05/23/2018  . CAD (coronary artery disease) 02/24/2018   LHC 10/19: pLAD 50, mLAD 50 (dFFR=0.89), oLCx 95, pRCA 30, mRCA 80 >> PCI: DES to LCx; DES to RCA // [id number] Trial participant (will DC Plavix after 30 d and remain on ASA + Apixaban)  . Chest pain secondary to ablation, with expected mild  elevation in troponin 05/22/2018  . Complication of anesthesia    BP dropped post colonoscopy  . Hypercholesteremia   . Hypertension   . Persistent atrial fibrillation   . Sleep apnea    does use a cpap  . Wears glasses     Tobacco Use: Social History   Tobacco Use  Smoking Status Former Smoker  . Last attempt to quit: 07/09/1973  . Years since quitting: 44.9  Smokeless Tobacco Never Used    Labs: Recent Review Advice worker    Labs for ITP Cardiac and Pulmonary Rehab Latest Ref Rng & Units 11/23/2015 04/09/2018   Cholestrol 100 - 199 mg/dL - 82(N)   LDLCALC 0 - 99 mg/dL - 50   HDL >56 mg/dL - 21(H)   Trlycerides 0 - 149 mg/dL - 77   TCO2 0 - 086 mmol/L 26 -      Capillary Blood Glucose: No results found for: GLUCAP   Exercise Target Goals: Exercise Program Goal: Individual exercise prescription set using results from initial 6 min walk test and THRR while considering  patient's activity barriers and safety.   Exercise Prescription Goal: Initial exercise prescription builds to 30-45 minutes a day of aerobic activity, 2-3 days per week.  Home exercise guidelines will be given to patient during program as part of exercise prescription that the participant will acknowledge.  Activity Barriers & Risk Stratification:  Activity Barriers & Cardiac Risk Stratification - 05/06/18 1047      Activity Barriers & Cardiac Risk Stratification   Activity Barriers  Deconditioning;Balance Concerns;Other (comment)    Comments  Occasional Knee Pain    Cardiac Risk Stratification  Moderate       6 Minute Walk: 6 Minute Walk    Row Name 05/06/18 1044 05/06/18 1121       6 Minute Walk   Phase  Initial  -    Distance  1478 feet  -    Walk Time  6 minutes  -    # of Rest Breaks  0  -    MPH  2.79  -    METS  2.76  -    RPE  11  -    Perceived Dyspnea   0  -    VO2 Peak  9.65  -    Symptoms  No  Yes (comment)    Comments  -  Mild SOB +1    Resting HR  73 bpm  -    Resting BP   120/70  -    Resting Oxygen Saturation   95 %  -    Exercise Oxygen Saturation  during 6 min walk  99 %  -    Max Ex. HR  134 bpm  -    Max Ex. BP  130/74  -    2 Minute Post BP  110/76  -       Oxygen Initial Assessment:   Oxygen Re-Evaluation:   Oxygen Discharge (Final Oxygen Re-Evaluation):   Initial Exercise Prescription: Initial Exercise Prescription - 05/06/18 1000      Date of Initial Exercise RX and Referring Provider   Date  05/06/18    Referring Provider  Tonny Bollman MD     Expected Discharge Date  08/13/18      NuStep   Level  2    SPM  75    Minutes  10    METs  2.3      Arm Ergometer   Level  2.5    Watts  40    Minutes  10    METs  2.68      Track   Laps  11    Minutes  10    METs  2.91      Prescription Details   Frequency (times per week)  3x    Duration  Progress to 30 minutes of continuous aerobic without signs/symptoms of physical distress      Intensity   THRR 40-80% of Max Heartrate  5-118    Ratings of Perceived Exertion  11-13    Perceived Dyspnea  0-4      Progression   Progression  Continue progressive overload as per policy without signs/symptoms or physical distress.      Resistance Training   Training Prescription  Yes    Weight  3lbs    Reps  10-15       Perform Capillary Blood Glucose checks as needed.  Exercise Prescription Changes: Exercise Prescription Changes    Row Name 05/10/18 1031 05/14/18 1030 05/31/18 1427         Response to Exercise   Blood Pressure (Admit)  120/70  116/70  118/62     Blood Pressure (Exercise)  138/82  120/72  120/80     Blood Pressure (Exit)  116/60  120/80  120/80     Heart Rate (Admit)  97 bpm  84 bpm  57 bpm     Heart Rate (Exercise)  145 bpm  136 bpm  104 bpm     Heart Rate (Exit)  95 bpm  92 bpm  57 bpm     Rating of Perceived Exertion (Exercise)  11  11  12      Perceived Dyspnea (Exercise)  0  0  0     Symptoms  Elevated HR   None  None     Comments  Pt oriented to  exercise equipment  None  Pt's first day back since on medical hold      Duration  Progress to 30 minutes of  aerobic without signs/symptoms of physical distress  Progress to 30 minutes of  aerobic without signs/symptoms of physical distress  Progress to 30 minutes of  aerobic without signs/symptoms of physical distress     Intensity  THRR unchanged  THRR unchanged  THRR unchanged       Progression   Progression  Continue to progress workloads to maintain intensity without signs/symptoms of physical distress.  Continue to progress workloads to maintain intensity without signs/symptoms of physical distress.  Continue to progress workloads to maintain intensity without signs/symptoms of physical distress.     Average METs  2.23  2.52  2.69       Resistance Training   Training Prescription  Yes  Yes  No     Weight  3lbs  3lbs  3lbs     Reps  10-15  10-15  10-15     Time  10 Minutes  10 Minutes  10 Minutes       NuStep   Level  2  2  2      SPM  75  75  80     Minutes  10  10  10      METs  2.1  2.2  2.8       Arm Ergometer   Level  2.5  2.5  2.5     Watts  40  40  40     Minutes  10  10  10      METs  2.04  2.05  2.05       Track   Laps  9  13  13      Minutes  10  10  10      METs  2.53  3.3  3.3        Exercise Comments: Exercise Comments    Row Name 05/10/18 1033 06/03/18 1429         Exercise Comments  Pt's first day of exercise. Pt oriented to exercise equipment. Pt responded well to workloads. Will continue to monitor and progress as tolerated.   Pt has returned to exercise from ablation procedure. Pt is responding well to exercise workloads. Will continue to monitor and progress pt as tolerated. Will follow up with pt regarding home exercise plan.          Exercise Goals and Review: Exercise Goals    Row Name 05/06/18 1041             Exercise Goals   Increase Physical Activity  Yes       Intervention  Provide advice, education, support and counseling about  physical activity/exercise needs.;Develop an individualized exercise prescription for aerobic and resistive training based on initial evaluation findings, risk stratification, comorbidities and participant's personal goals.       Expected Outcomes  Short Term: Attend rehab on a regular  basis to increase amount of physical activity.;Long Term: Add in home exercise to make exercise part of routine and to increase amount of physical activity.;Long Term: Exercising regularly at least 3-5 days a week.       Increase Strength and Stamina  Yes       Intervention  Provide advice, education, support and counseling about physical activity/exercise needs.;Develop an individualized exercise prescription for aerobic and resistive training based on initial evaluation findings, risk stratification, comorbidities and participant's personal goals.       Expected Outcomes  Short Term: Increase workloads from initial exercise prescription for resistance, speed, and METs.;Short Term: Perform resistance training exercises routinely during rehab and add in resistance training at home;Long Term: Improve cardiorespiratory fitness, muscular endurance and strength as measured by increased METs and functional capacity (6MWT)       Able to understand and use rate of perceived exertion (RPE) scale  Yes       Intervention  Provide education and explanation on how to use RPE scale       Expected Outcomes  Short Term: Able to use RPE daily in rehab to express subjective intensity level;Long Term:  Able to use RPE to guide intensity level when exercising independently       Knowledge and understanding of Target Heart Rate Range (THRR)  Yes       Intervention  Provide education and explanation of THRR including how the numbers were predicted and where they are located for reference       Expected Outcomes  Short Term: Able to state/look up THRR;Short Term: Able to use daily as guideline for intensity in rehab;Long Term: Able to use THRR to  govern intensity when exercising independently       Able to check pulse independently  Yes       Intervention  Review the importance of being able to check your own pulse for safety during independent exercise;Provide education and demonstration on how to check pulse in carotid and radial arteries.       Expected Outcomes  Short Term: Able to explain why pulse checking is important during independent exercise;Long Term: Able to check pulse independently and accurately       Understanding of Exercise Prescription  Yes       Intervention  Provide education, explanation, and written materials on patient's individual exercise prescription       Expected Outcomes  Short Term: Able to explain program exercise prescription;Long Term: Able to explain home exercise prescription to exercise independently          Exercise Goals Re-Evaluation :   Discharge Exercise Prescription (Final Exercise Prescription Changes): Exercise Prescription Changes - 05/31/18 1427      Response to Exercise   Blood Pressure (Admit)  118/62    Blood Pressure (Exercise)  120/80    Blood Pressure (Exit)  120/80    Heart Rate (Admit)  57 bpm    Heart Rate (Exercise)  104 bpm    Heart Rate (Exit)  57 bpm    Rating of Perceived Exertion (Exercise)  12    Perceived Dyspnea (Exercise)  0    Symptoms  None    Comments  Pt's first day back since on medical hold     Duration  Progress to 30 minutes of  aerobic without signs/symptoms of physical distress    Intensity  THRR unchanged      Progression   Progression  Continue to progress workloads to maintain intensity without signs/symptoms  of physical distress.    Average METs  2.69      Resistance Training   Training Prescription  No    Weight  3lbs    Reps  10-15    Time  10 Minutes      NuStep   Level  2    SPM  80    Minutes  10    METs  2.8      Arm Ergometer   Level  2.5    Watts  40    Minutes  10    METs  2.05      Track   Laps  13    Minutes  10     METs  3.3       Nutrition:  Target Goals: Understanding of nutrition guidelines, daily intake of sodium 1500mg , cholesterol 200mg , calories 30% from fat and 7% or less from saturated fats, daily to have 5 or more servings of fruits and vegetables.  Biometrics: Pre Biometrics - 05/06/18 1045      Pre Biometrics   Height  5\' 11"  (1.803 m)    Weight  126 kg    Waist Circumference  49 inches    Hip Circumference  53.5 inches    Waist to Hip Ratio  0.92 %    BMI (Calculated)  38.76    Triceps Skinfold  24 mm    % Body Fat  37.6 %    Grip Strength  44 kg    Flexibility  9.5 in    Single Leg Stand  7.84 seconds        Nutrition Therapy Plan and Nutrition Goals: Nutrition Therapy & Goals - 05/06/18 1427      Nutrition Therapy   Diet  heart healthy      Personal Nutrition Goals   Nutrition Goal  Pt to identify and limit food sources of saturated fat, trans fat, refined carbohydrates and sodium    Personal Goal #2  Pt to identify food quantities necessary to achieve weight loss of 6-24 lbs. at graduation from cardiac rehab.    Personal Goal #3  Pt to identify and limit food sources of saturated fat, trans fat, refined carbohydrates and sodium      Intervention Plan   Intervention  Prescribe, educate and counsel regarding individualized specific dietary modifications aiming towards targeted core components such as weight, hypertension, lipid management, diabetes, heart failure and other comorbidities.    Expected Outcomes  Short Term Goal: Understand basic principles of dietary content, such as calories, fat, sodium, cholesterol and nutrients.;Long Term Goal: Adherence to prescribed nutrition plan.       Nutrition Assessments: Nutrition Assessments - 05/06/18 1428      MEDFICTS Scores   Pre Score  56       Nutrition Goals Re-Evaluation: Nutrition Goals Re-Evaluation    Row Name 05/06/18 1427             Goals   Current Weight  277 lb 12.5 oz (126 kg)           Nutrition Goals Re-Evaluation: Nutrition Goals Re-Evaluation    Row Name 05/06/18 1427             Goals   Current Weight  277 lb 12.5 oz (126 kg)          Nutrition Goals Discharge (Final Nutrition Goals Re-Evaluation): Nutrition Goals Re-Evaluation - 05/06/18 1427      Goals   Current Weight  277 lb 12.5  oz (126 kg)       Psychosocial: Target Goals: Acknowledge presence or absence of significant depression and/or stress, maximize coping skills, provide positive support system. Participant is able to verbalize types and ability to use techniques and skills needed for reducing stress and depression.  Initial Review & Psychosocial Screening: Initial Psych Review & Screening - 05/06/18 0942      Initial Review   Current issues with  None Identified      Family Dynamics   Good Support System?  Yes   Ronald Hull lists his wife, children, and close friends as sources of support.      Barriers   Psychosocial barriers to participate in program  There are no identifiable barriers or psychosocial needs.      Screening Interventions   Interventions  Encouraged to exercise;Provide feedback about the scores to participant    Expected Outcomes  Short Term goal: Identification and review with participant of any Quality of Life or Depression concerns found by scoring the questionnaire.;Long Term goal: The participant improves quality of Life and PHQ9 Scores as seen by post scores and/or verbalization of changes       Quality of Life Scores: Quality of Life - 05/06/18 0949      Quality of Life   Select  Quality of Life      Quality of Life Scores   Health/Function Pre  16.6 %    Socioeconomic Pre  19.5 %    Psych/Spiritual Pre  18.93 %    Family Pre  22.4 %    GLOBAL Pre  18.57 %      Scores of 19 and below usually indicate a poorer quality of life in these areas.  A difference of  2-3 points is a clinically meaningful difference.  A difference of 2-3 points in the total score  of the Quality of Life Index has been associated with significant improvement in overall quality of life, self-image, physical symptoms, and general health in studies assessing change in quality of life.  PHQ-9: Recent Review Flowsheet Data    Depression screen [facility] 2/9 05/10/2018 02/12/2015   Decreased Interest 0 0   Down, Depressed, Hopeless 0 0   PHQ - 2 Score 0 0     Interpretation of Total Score  Total Score Depression Severity:  1-4 = Minimal depression, 5-9 = Mild depression, 10-14 = Moderate depression, 15-19 = Moderately severe depression, 20-27 = Severe depression   Psychosocial Evaluation and Intervention: Psychosocial Evaluation - 06/03/18 1044      Psychosocial Evaluation & Interventions   Interventions  Stress management education;Relaxation education;Encouraged to exercise with the program and follow exercise prescription    Comments  Ronald Hull has had his ablation.  He is hopeful to maintain NSR.     Expected Outcomes  Ronald Hull will maintain a positive outlook with good coping skills.     Continue Psychosocial Services   Follow up required by staff       Psychosocial Re-Evaluation: Psychosocial Re-Evaluation    Row Name 06/03/18 1047             Psychosocial Re-Evaluation   Current issues with  None Identified       Comments  No psychosocial interventions.       Expected Outcomes  Ronald Hull will maintain a positive outlook.        Interventions  Relaxation education;Stress management education;Encouraged to attend Cardiac Rehabilitation for the exercise       Continue Psychosocial Services   No  Follow up required          Psychosocial Discharge (Final Psychosocial Re-Evaluation): Psychosocial Re-Evaluation - 06/03/18 1047      Psychosocial Re-Evaluation   Current issues with  None Identified    Comments  No psychosocial interventions.    Expected Outcomes  Ronald Hull will maintain a positive outlook.     Interventions  Relaxation education;Stress management education;Encouraged  to attend Cardiac Rehabilitation for the exercise    Continue Psychosocial Services   No Follow up required       Vocational Rehabilitation: Provide vocational rehab assistance to qualifying candidates.   Vocational Rehab Evaluation & Intervention: Vocational Rehab - 05/06/18 0950      Initial Vocational Rehab Evaluation & Intervention   Assessment shows need for Vocational Rehabilitation  No       Education: Education Goals: Education classes will be provided on a weekly basis, covering required topics. Participant will state understanding/return demonstration of topics presented.  Learning Barriers/Preferences: Learning Barriers/Preferences - 05/06/18 1036      Learning Barriers/Preferences   Learning Barriers  Sight    Learning Preferences  Written Material;Verbal Instruction       Education Topics: Count Your Pulse:  -Group instruction provided by verbal instruction, demonstration, patient participation and written materials to support subject.  Instructors address importance of being able to find your pulse and how to count your pulse when at home without a heart monitor.  Patients get hands on experience counting their pulse with staff help and individually.   Heart Attack, Angina, and Risk Factor Modification:  -Group instruction provided by verbal instruction, video, and written materials to support subject.  Instructors address signs and symptoms of angina and heart attacks.    Also discuss risk factors for heart disease and how to make changes to improve heart health risk factors.   Functional Fitness:  -Group instruction provided by verbal instruction, demonstration, patient participation, and written materials to support subject.  Instructors address safety measures for doing things around the house.  Discuss how to get up and down off the floor, how to pick things up properly, how to safely get out of a chair without assistance, and balance training.   Meditation  and Mindfulness:  -Group instruction provided by verbal instruction, patient participation, and written materials to support subject.  Instructor addresses importance of mindfulness and meditation practice to help reduce stress and improve awareness.  Instructor also leads participants through a meditation exercise.    Stretching for Flexibility and Mobility:  -Group instruction provided by verbal instruction, patient participation, and written materials to support subject.  Instructors lead participants through series of stretches that are designed to increase flexibility thus improving mobility.  These stretches are additional exercise for major muscle groups that are typically performed during regular warm up and cool down.   Hands Only CPR:  -Group verbal, video, and participation provides a basic overview of AHA guidelines for community CPR. Role-play of emergencies allow participants the opportunity to practice calling for help and chest compression technique with discussion of AED use.   Hypertension: -Group verbal and written instruction that provides a basic overview of hypertension including the most recent diagnostic guidelines, risk factor reduction with self-care instructions and medication management.    Nutrition I class: Heart Healthy Eating:  -Group instruction provided by PowerPoint slides, verbal discussion, and written materials to support subject matter. The instructor gives an explanation and review of the Therapeutic Lifestyle Changes diet recommendations, which includes a discussion on lipid goals,  dietary fat, sodium, fiber, plant stanol/sterol esters, sugar, and the components of a well-balanced, healthy diet.   Nutrition II class: Lifestyle Skills:  -Group instruction provided by PowerPoint slides, verbal discussion, and written materials to support subject matter. The instructor gives an explanation and review of label reading, grocery shopping for heart health,  heart healthy recipe modifications, and ways to make healthier choices when eating out.   Diabetes Question & Answer:  -Group instruction provided by PowerPoint slides, verbal discussion, and written materials to support subject matter. The instructor gives an explanation and review of diabetes co-morbidities, pre- and post-prandial blood glucose goals, pre-exercise blood glucose goals, signs, symptoms, and treatment of hypoglycemia and hyperglycemia, and foot care basics.   Diabetes Blitz:  -Group instruction provided by PowerPoint slides, verbal discussion, and written materials to support subject matter. The instructor gives an explanation and review of the physiology behind type 1 and type 2 diabetes, diabetes medications and rational behind using different medications, pre- and post-prandial blood glucose recommendations and Hemoglobin A1c goals, diabetes diet, and exercise including blood glucose guidelines for exercising safely.    Portion Distortion:  -Group instruction provided by PowerPoint slides, verbal discussion, written materials, and food models to support subject matter. The instructor gives an explanation of serving size versus portion size, changes in portions sizes over the last 20 years, and what consists of a serving from each food group.   Stress Management:  -Group instruction provided by verbal instruction, video, and written materials to support subject matter.  Instructors review role of stress in heart disease and how to cope with stress positively.     Exercising on Your Own:  -Group instruction provided by verbal instruction, power point, and written materials to support subject.  Instructors discuss benefits of exercise, components of exercise, frequency and intensity of exercise, and end points for exercise.  Also discuss use of nitroglycerin and activating EMS.  Review options of places to exercise outside of rehab.  Review guidelines for sex with heart  disease.   Cardiac Drugs I:  -Group instruction provided by verbal instruction and written materials to support subject.  Instructor reviews cardiac drug classes: antiplatelets, anticoagulants, beta blockers, and statins.  Instructor discusses reasons, side effects, and lifestyle considerations for each drug class.   Cardiac Drugs II:  -Group instruction provided by verbal instruction and written materials to support subject.  Instructor reviews cardiac drug classes: angiotensin converting enzyme inhibitors (ACE-I), angiotensin II receptor blockers (ARBs), nitrates, and calcium channel blockers.  Instructor discusses reasons, side effects, and lifestyle considerations for each drug class.   Anatomy and Physiology of the Circulatory System:  Group verbal and written instruction and models provide basic cardiac anatomy and physiology, with the coronary electrical and arterial systems. Review of: AMI, Angina, Valve disease, Heart Failure, Peripheral Artery Disease, Cardiac Arrhythmia, Pacemakers, and the ICD.   Other Education:  -Group or individual verbal, written, or video instructions that support the educational goals of the cardiac rehab program.   Holiday Eating Survival Tips:  -Group instruction provided by PowerPoint slides, verbal discussion, and written materials to support subject matter. The instructor gives patients tips, tricks, and techniques to help them not only survive but enjoy the holidays despite the onslaught of food that accompanies the holidays.   Knowledge Questionnaire Score: Knowledge Questionnaire Score - 05/06/18 1036      Knowledge Questionnaire Score   Pre Score  19/24       Core Components/Risk Factors/Patient Goals at Admission: Personal Goals and  Risk Factors at Admission - 05/06/18 1040      Core Components/Risk Factors/Patient Goals on Admission    Weight Management  Yes;Obesity    Intervention  Weight Management: Develop a combined nutrition and  exercise program designed to reach desired caloric intake, while maintaining appropriate intake of nutrient and fiber, sodium and fats, and appropriate energy expenditure required for the weight goal.;Weight Management: Provide education and appropriate resources to help participant work on and attain dietary goals.;Obesity: Provide education and appropriate resources to help participant work on and attain dietary goals.;Weight Management/Obesity: Establish reasonable short term and long term weight goals.    Admit Weight  277 lb 12.5 oz (126 kg)    Goal Weight: Short Term  260 lb (117.9 kg)    Goal Weight: Long Term  230 lb (104.3 kg)    Expected Outcomes  Short Term: Continue to assess and modify interventions until short term weight is achieved;Long Term: Adherence to nutrition and physical activity/exercise program aimed toward attainment of established weight goal;Weight Loss: Understanding of general recommendations for a balanced deficit meal plan, which promotes 1-2 lb weight loss per week and includes a negative energy balance of [phone] kcal/d;Understanding recommendations for meals to include 15-35% energy as protein, 25-35% energy from fat, 35-60% energy from carbohydrates, less than 200mg  of dietary cholesterol, 20-35 gm of total fiber daily;Understanding of distribution of calorie intake throughout the day with the consumption of 4-5 meals/snacks    Hypertension  Yes    Intervention  Provide education on lifestyle modifcations including regular physical activity/exercise, weight management, moderate sodium restriction and increased consumption of fresh fruit, vegetables, and low fat dairy, alcohol moderation, and smoking cessation.;Monitor prescription use compliance.    Expected Outcomes  Short Term: Continued assessment and intervention until BP is < 140/24mm HG in hypertensive participants. < 130/27mm HG in hypertensive participants with diabetes, heart failure or chronic kidney  disease.;Long Term: Maintenance of blood pressure at goal levels.    Lipids  Yes    Intervention  Provide education and support for participant on nutrition & aerobic/resistive exercise along with prescribed medications to achieve LDL 70mg , HDL >40mg .    Expected Outcomes  Short Term: Participant states understanding of desired cholesterol values and is compliant with medications prescribed. Participant is following exercise prescription and nutrition guidelines.;Long Term: Cholesterol controlled with medications as prescribed, with individualized exercise RX and with personalized nutrition plan. Value goals: LDL < 70mg , HDL > 40 mg.       Core Components/Risk Factors/Patient Goals Review:  Goals and Risk Factor Review    Row Name 05/10/18 1542 06/03/18 1048           Core Components/Risk Factors/Patient Goals Review   Personal Goals Review  Weight Management/Obesity;Lipids;Hypertension  Weight Management/Obesity;Lipids;Hypertension      Review  Pt willing to participate in CR exericise.  Ronald Hull would like to increase his exercise tolerance and stamina.  He would also like to continue to lose weight.   Pt willing to participate in CR exericise.  Ronald Hull was on hold for his ablation and subsequent recovery.  He has returned to exercise and tolerated it well, maintaining NSR.       Expected Outcomes  Pt will continue to participate in CR exercise, nutrition, and lifestyle modification opportunities.   Pt will continue to participate in CR exercise, nutrition, and lifestyle modification opportunities.          Core Components/Risk Factors/Patient Goals at Discharge (Final Review):  Goals and Risk Factor Review -  06/03/18 1048      Core Components/Risk Factors/Patient Goals Review   Personal Goals Review  Weight Management/Obesity;Lipids;Hypertension    Review  Pt willing to participate in CR exericise.  Ronald Hull was on hold for his ablation and subsequent recovery.  He has returned to exercise and  tolerated it well, maintaining NSR.     Expected Outcomes  Pt will continue to participate in CR exercise, nutrition, and lifestyle modification opportunities.        ITP Comments: ITP Comments    Row Name 05/06/18 0940 05/06/18 1032 05/10/18 1537 06/03/18 1051     ITP Comments  Dr. Armanda Magic, Medical Director  Dr. Armanda Magic, Medical Director  Pt started exercise today and tolerated it fairly well.  HR elevated at times above THR.   30 Day ITP Review. Ronald Hull was on hold for his ablation and subsequent recovery.  He has returned to exercise and tolerated it well, maintaining NSR.       Comments: See ITP Comments.

## 2018-06-04 ENCOUNTER — Encounter (HOSPITAL_COMMUNITY)
Admission: RE | Admit: 2018-06-04 | Discharge: 2018-06-04 | Disposition: A | Discharge status: Home / Self Care | Payer: Medicare Other | Source: Ambulatory Visit | Attending: Cardiovascular Disease | Admitting: Cardiovascular Disease

## 2018-06-04 ENCOUNTER — Ambulatory Visit (HOSPITAL_COMMUNITY): Payer: Medicare Other

## 2018-06-04 DIAGNOSIS — Z955 Presence of coronary angioplasty implant and graft: Secondary | ICD-10-CM | POA: Diagnosis not present

## 2018-06-07 ENCOUNTER — Ambulatory Visit (HOSPITAL_COMMUNITY): Payer: Medicare Other

## 2018-06-07 ENCOUNTER — Encounter (HOSPITAL_COMMUNITY)
Admission: RE | Admit: 2018-06-07 | Discharge: 2018-06-07 | Disposition: A | Discharge status: Home / Self Care | Payer: Medicare Other | Source: Ambulatory Visit | Attending: Cardiovascular Disease | Admitting: Cardiovascular Disease

## 2018-06-07 DIAGNOSIS — Z955 Presence of coronary angioplasty implant and graft: Secondary | ICD-10-CM

## 2018-06-07 NOTE — Progress Notes (Signed)
Ronald Hull 73 y.o. male Nutrition Note Spoke with pt. Nutrition plan and goals reviewed with pt. Pt is following heart healthy diet. Pt wants to lose wt. Pt has been trying to lose wt by decreasing processed foods, eating lean protein, eating smaller portions. Heart healthy weight loss tips reviewed (label reading, how to build a healthy plate, portion sizes, eating frequently across the day). Pt has not been trying mindful eating exercises. Reviewed eating mindfully/ intuitively with patient and set goal with him to try these exercises at home. Per discussion, pt does not use canned/convenience foods often. Pt does not add salt to food. Pt does not eat out frequently. Pt expressed understanding of the information reviewed. Pt aware of nutrition education classes offered and would like to attend nutrition classes.  No results found for: HGBA1C  Wt Readings from Last 3 Encounters:  05/23/18 272 lb (123.4 kg)  05/21/18 275 lb 2.2 oz (124.8 kg)  05/06/18 277 lb 12.5 oz (126 kg)    Nutrition Diagnosis  Food-and nutrition-related knowledge deficit related to lack of exposure to information as related to diagnosis of: ? CVD  Obese  II = 35-39.9 related to excessive energy intake as evidenced by a Body mass index is 38.74 kg/m.  Nutrition Intervention ? Pt's individual nutrition plan reviewed with pt. ? Benefits of adopting Heart Healthy diet discussed when Med[employer]icts reviewed.   ? Pt given handouts for: ? Mindful/Intuitive eating  Goal(s)  Pt to identify and limit food sources of saturated fat, trans fat, refined carbohydrates and sodium  Pt to identify food quantities necessary to achieve weight loss of 6-24 lbs. at graduation from cardiac rehab.  Pt to practice mindful and intuitive eating  Plan:   Pt to attend nutrition classes ? Nutrition I ? Nutrition II ? Portion Distortion   Will provide client-centered nutrition education as part of interdisciplinary care  Monitor and  evaluate progress toward nutrition goal with team.    Ross Marcus, MS, RD, LDN 06/07/2018 12:03 PM

## 2018-06-09 ENCOUNTER — Ambulatory Visit (HOSPITAL_COMMUNITY): Payer: Medicare Other

## 2018-06-09 ENCOUNTER — Encounter (HOSPITAL_COMMUNITY)
Admission: RE | Admit: 2018-06-09 | Discharge: 2018-06-09 | Disposition: A | Discharge status: Home / Self Care | Payer: Medicare Other | Source: Ambulatory Visit | Attending: Cardiovascular Disease | Admitting: Cardiovascular Disease

## 2018-06-09 DIAGNOSIS — Z955 Presence of coronary angioplasty implant and graft: Secondary | ICD-10-CM | POA: Diagnosis not present

## 2018-06-11 ENCOUNTER — Encounter (HOSPITAL_COMMUNITY)
Admission: RE | Admit: 2018-06-11 | Discharge: 2018-06-11 | Disposition: A | Discharge status: Home / Self Care | Payer: Medicare Other | Source: Ambulatory Visit | Attending: Cardiovascular Disease | Admitting: Cardiovascular Disease

## 2018-06-11 ENCOUNTER — Ambulatory Visit (HOSPITAL_COMMUNITY): Payer: Medicare Other

## 2018-06-11 DIAGNOSIS — Z955 Presence of coronary angioplasty implant and graft: Secondary | ICD-10-CM | POA: Diagnosis not present

## 2018-06-14 ENCOUNTER — Ambulatory Visit (HOSPITAL_COMMUNITY): Payer: Medicare Other

## 2018-06-14 ENCOUNTER — Encounter (HOSPITAL_COMMUNITY)
Admission: RE | Admit: 2018-06-14 | Discharge: 2018-06-14 | Disposition: A | Discharge status: Home / Self Care | Payer: Medicare Other | Source: Ambulatory Visit | Attending: Cardiovascular Disease | Admitting: Cardiovascular Disease

## 2018-06-14 DIAGNOSIS — Z955 Presence of coronary angioplasty implant and graft: Secondary | ICD-10-CM | POA: Diagnosis not present

## 2018-06-15 ENCOUNTER — Encounter (HOSPITAL_COMMUNITY): Payer: Self-pay | Admitting: Nurse Practitioner

## 2018-06-15 ENCOUNTER — Ambulatory Visit (HOSPITAL_COMMUNITY)
Admission: RE | Admit: 2018-06-15 | Discharge: 2018-06-15 | Disposition: A | Discharge status: Home / Self Care | Payer: Medicare Other | Source: Ambulatory Visit | Attending: Nurse Practitioner | Admitting: Nurse Practitioner

## 2018-06-15 VITALS — BP 140/76 | HR 54 | Ht 71.0 in | Wt 269.0 lb

## 2018-06-15 DIAGNOSIS — M1990 Unspecified osteoarthritis, unspecified site: Secondary | ICD-10-CM | POA: Diagnosis not present

## 2018-06-15 DIAGNOSIS — E7800 Pure hypercholesterolemia, unspecified: Secondary | ICD-10-CM | POA: Insufficient documentation

## 2018-06-15 DIAGNOSIS — I4819 Other persistent atrial fibrillation: Secondary | ICD-10-CM | POA: Diagnosis not present

## 2018-06-15 DIAGNOSIS — Z7901 Long term (current) use of anticoagulants: Secondary | ICD-10-CM | POA: Diagnosis not present

## 2018-06-15 DIAGNOSIS — Z79899 Other long term (current) drug therapy: Secondary | ICD-10-CM | POA: Insufficient documentation

## 2018-06-15 DIAGNOSIS — Z87891 Personal history of nicotine dependence: Secondary | ICD-10-CM | POA: Insufficient documentation

## 2018-06-15 DIAGNOSIS — J40 Bronchitis, not specified as acute or chronic: Secondary | ICD-10-CM | POA: Insufficient documentation

## 2018-06-15 DIAGNOSIS — Z7982 Long term (current) use of aspirin: Secondary | ICD-10-CM | POA: Insufficient documentation

## 2018-06-15 DIAGNOSIS — I10 Essential (primary) hypertension: Secondary | ICD-10-CM | POA: Insufficient documentation

## 2018-06-15 DIAGNOSIS — I2510 Atherosclerotic heart disease of native coronary artery without angina pectoris: Secondary | ICD-10-CM | POA: Insufficient documentation

## 2018-06-15 NOTE — Progress Notes (Signed)
Primary Care Physician: Renford Dills, MD Referring Physician: Dr. Elberta Fortis Cardiologist: Dr. Madolyn Frieze Ronald Hull is a 73 y.o. male with a h/o persistent afib, CAD, that is in the afib clinic for f/u of afib ablation 1/23. He reports no afib since the procedure. He is in rhythm today.No swallowing or groin issues.  Today, he denies symptoms of palpitations, chest pain, shortness of breath, orthopnea, PND, lower extremity edema, dizziness, presyncope, syncope, or neurologic sequela. The patient is tolerating medications without difficulties and is otherwise without complaint today.   Past Medical History:  Diagnosis Date  . Arthritis   . Bronchitis 05/23/2018  . CAD (coronary artery disease) 02/24/2018   LHC 10/19: pLAD 50, mLAD 50 (dFFR=0.89), oLCx 95, pRCA 30, mRCA 80 >> PCI: DES to LCx; DES to RCA // [id number] Trial participant (will DC Plavix after 30 d and remain on ASA + Apixaban)  . Chest pain secondary to ablation, with expected mild elevation in troponin 05/22/2018  . Complication of anesthesia    BP dropped post colonoscopy  . Hypercholesteremia   . Hypertension   . Persistent atrial fibrillation   . Sleep apnea    does use a cpap  . Wears glasses    Past Surgical History:  Procedure Laterality Date  . ATRIAL FIBRILLATION ABLATION N/A 05/20/2018   Procedure: ATRIAL FIBRILLATION ABLATION;  Surgeon: Regan Lemming, MD;  Location: [facility] INVASIVE CV LAB;  Service: Cardiovascular;  Laterality: N/A;  . CARDIOVERSION N/A 11/01/2015   Procedure: CARDIOVERSION;  Surgeon: [name], MD;  Location: [facility] ENDOSCOPY;  Service: Cardiovascular;  Laterality: N/A;  . CARDIOVERSION N/A 11/23/2015   Procedure: CARDIOVERSION;  Surgeon: Will [name], MD;  Location: [facility] ENDOSCOPY;  Service: Cardiovascular;  Laterality: N/A;  . CARDIOVERSION N/A 10/12/2017   Procedure: CARDIOVERSION;  Surgeon: [name], MD;  Location: [facility] ENDOSCOPY;  Service: Cardiovascular;  Laterality:  N/A;  . COLONOSCOPY    . CORONARY STENT INTERVENTION N/A 02/24/2018   Procedure: CORONARY STENT INTERVENTION;  Surgeon: Tonny Bollman, MD;  Location: [facility] INVASIVE CV LAB;  Service: Cardiovascular;  Laterality: N/A;  . INTRAVASCULAR PRESSURE WIRE/FFR STUDY N/A 02/24/2018   Procedure: INTRAVASCULAR PRESSURE WIRE/FFR STUDY;  Surgeon: Tonny Bollman, MD;  Location: [facility] INVASIVE CV LAB;  Service: Cardiovascular;  Laterality: N/A;  . KNEE ARTHROSCOPY  2000  . LEFT HEART CATH AND CORONARY ANGIOGRAPHY N/A 02/24/2018   Procedure: LEFT HEART CATH AND CORONARY ANGIOGRAPHY;  Surgeon: Tonny Bollman, MD;  Location: [facility] INVASIVE CV LAB;  Service: Cardiovascular;  Laterality: N/A;  . SHOULDER ARTHROSCOPY WITH ROTATOR CUFF REPAIR AND SUBACROMIAL DECOMPRESSION Right 07/15/2012   Procedure: RIGHT ARTHROSCOPY SHOULDER DECOMPRESSION  SUBACROMIAL PARTIAL ACROMIOPLASTY WITH CORACOACROMIAL RELEASE,  DISTAL CLAVICULECTOMY, resect biceps debride labrium WITH ROTATOR CUFF REPAIR;  Surgeon: [name], MD;  Location: [facility];  Service: Orthopedics;  Laterality: Right;    Current Outpatient Medications  Medication Sig Dispense Refill  . acetaminophen (TYLENOL) 325 MG tablet Take 650 mg by mouth every 6 (six) hours as needed (for pain.).    Marland Kitchen ASPIRIN LOW DOSE 81 MG chewable tablet Chew 81 mg by mouth every evening.   0  . atorvastatin (LIPITOR) 40 MG tablet Take 1 tablet (40 mg total) by mouth daily. 90 tablet 3  . ELIQUIS 5 MG TABS tablet TAKE 1 TABLET(5 MG) BY MOUTH TWICE DAILY 60 tablet 5  . hydrocortisone cream 1 % Apply 1 application topically 2 (two) times daily as needed for  itching.    . metoprolol tartrate (LOPRESSOR) 50 MG tablet Take 1 tablet (50 mg total) by mouth 2 (two) times daily. 180 tablet 3  . nitroGLYCERIN (NITROSTAT) 0.4 MG SL tablet Place 1 tablet (0.4 mg total) under the tongue every 5 (five) minutes as needed. (Patient not taking: Reported on 06/15/2018) 25 tablet 2   No  current facility-administered medications for this encounter.     Allergies  Allergen Reactions  . Lisinopril Cough    Social History   Socioeconomic History  . Marital status: Married    Spouse name: Not on file  . Number of children: Not on file  . Years of education: Not on file  . Highest education level: Not on file  Occupational History  . Not on file  Social Needs  . Financial resource strain: Not very hard  . Food insecurity:    Worry: Never true    Inability: Never true  . Transportation needs:    Medical: No    Non-medical: No  Tobacco Use  . Smoking status: Former Smoker    Last attempt to quit: 07/09/1973    Years since quitting: 44.9  . Smokeless tobacco: Never Used  Substance and Sexual Activity  . Alcohol use: Yes    Comment: beers daily  . Drug use: No  . Sexual activity: Not Currently    Birth control/protection: Abstinence  Lifestyle  . Physical activity:    Days per week: Not on file    Minutes per session: Not on file  . Stress: Not on file  Relationships  . Social connections:    Talks on phone: Not on file    Gets together: Not on file    Attends religious service: Not on file    Active member of club or organization: Not on file    Attends meetings of clubs or organizations: Not on file    Relationship status: Not on file  . Intimate partner violence:    Fear of current or ex partner: No    Emotionally abused: No    Physically abused: No    Forced sexual activity: No  Other Topics Concern  . Not on file  Social History Narrative  . Not on file    Family History  Problem Relation Age of Onset  . Heart failure Father   . Other Mother        bad fall  . Clotting disorder Mother   . Cancer Sister   . Hypertension Sister   . Heart disease Brother     ROS- All systems are reviewed and negative except as per the HPI above  Physical Exam: Vitals:   06/15/18 0939  BP: 140/76  Pulse: (!) 54  Weight: 122 kg  Height: 5\' 11"   (1.803 m)   Wt Readings from Last 3 Encounters:  06/15/18 122 kg  05/23/18 123.4 kg  05/21/18 124.8 kg    Labs: Lab Results  Component Value Date   NA 139 05/23/2018   K 4.3 05/23/2018   CL 102 05/23/2018   CO2 27 05/23/2018   GLUCOSE 102 (H) 05/23/2018   BUN 14 05/23/2018   CREATININE 1.02 05/23/2018   CALCIUM 8.2 (L) 05/23/2018   No results found for: INR Lab Results  Component Value Date   CHOL 96 (L) 04/09/2018   HDL 31 (L) 04/09/2018   LDLCALC 50 04/09/2018   TRIG 77 04/09/2018     GEN- The patient is well appearing, alert and oriented x 3  today.   Head- normocephalic, atraumatic Eyes-  Sclera clear, conjunctiva pink Ears- hearing intact Oropharynx- clear Neck- supple, no JVP Lymph- no cervical lymphadenopathy Lungs- Clear to ausculation bilaterally, normal work of breathing Heart- Regular rate and rhythm, no murmurs, rubs or gallops, PMI not laterally displaced GI- soft, NT, ND, + BS Extremities- no clubbing, cyanosis, or edema MS- no significant deformity or atrophy Skin- no rash or lesion Psych- euthymic mood, full affect Neuro- strength and sensation are intact  EKG-sinus brady at 54 bpm, PR int 202 ms, qrs int 92 ms, qtc 383 ms    Assessment and Plan: 1. Persistent  afib Enjoying SR since the procedure Denies any issues  2. CAD Has a hospitalization for chest pain 2 days after the procedure, had mild expected elevation of troponin, pain thought to be 2/2 abaltion Currently no anginal symptoms Continue cardiac rehab  3. CHA2DS2VASc score of at least 3 Reminded not to interrupt anticoagulation for the 3 months following ablation   4. HTN  Stable   F/u pending with Dr. Elberta Fortis 3 months post procedure   Elvina Sidle. [facility] [address] [place], [place] [address] [phone]

## 2018-06-16 ENCOUNTER — Encounter (HOSPITAL_COMMUNITY)
Admission: RE | Admit: 2018-06-16 | Discharge: 2018-06-16 | Disposition: A | Discharge status: Home / Self Care | Payer: Medicare Other | Source: Ambulatory Visit | Attending: Cardiovascular Disease | Admitting: Cardiovascular Disease

## 2018-06-16 ENCOUNTER — Ambulatory Visit: Payer: Medicare Other | Admitting: Physician Assistant

## 2018-06-16 ENCOUNTER — Encounter: Payer: Self-pay | Admitting: Physician Assistant

## 2018-06-16 ENCOUNTER — Ambulatory Visit (HOSPITAL_COMMUNITY): Payer: Medicare Other

## 2018-06-16 VITALS — BP 114/78 | HR 72 | Ht 71.0 in | Wt 269.4 lb

## 2018-06-16 DIAGNOSIS — I10 Essential (primary) hypertension: Secondary | ICD-10-CM

## 2018-06-16 DIAGNOSIS — I4819 Other persistent atrial fibrillation: Secondary | ICD-10-CM

## 2018-06-16 DIAGNOSIS — E7800 Pure hypercholesterolemia, unspecified: Secondary | ICD-10-CM

## 2018-06-16 DIAGNOSIS — Z955 Presence of coronary angioplasty implant and graft: Secondary | ICD-10-CM | POA: Diagnosis not present

## 2018-06-16 DIAGNOSIS — I2510 Atherosclerotic heart disease of native coronary artery without angina pectoris: Secondary | ICD-10-CM | POA: Diagnosis not present

## 2018-06-16 NOTE — Patient Instructions (Signed)
Medication Instructions:  No changes  If you need a refill on your cardiac medications before your next appointment, please call your pharmacy.   Lab work: None   If you have labs (blood work) drawn today and your tests are completely normal, you will receive your results only by: Marland Kitchen MyChart Message (if you have MyChart) OR . A paper copy in the mail If you have any lab test that is abnormal or we need to change your treatment, we will call you to review the results.  Testing/Procedures: None   Follow-Up: At [facility], you and your health needs are our priority.  As part of our continuing mission to provide you with exceptional heart care, we have created designated Provider Care Teams.  These Care Teams include your primary Cardiologist (physician) and Advanced Practice Providers (APPs -  Physician Assistants and Nurse Practitioners) who all work together to provide you with the care you need, when you need it. You will need a follow up appointment in:  6 months.  Please call our office 2 months in advance to schedule this appointment.  You may see Tonny Bollman, MD or one of the following Advanced Practice Providers on your designated Care Team: Tereso Newcomer, PA-C  Any Other Special Instructions Will Be Listed Below (If Applicable).

## 2018-06-16 NOTE — Progress Notes (Signed)
Cardiology Office Note:    Date:  06/16/2018   ID:  Ronald Hull, DOB 09/16/1945, MRN [id number]  PCP:  Polite, Ronald, MD  Cardiologist:  Ronald Cooper, MD   Electrophysiologist:  Ronald Martin Camnitz, MD   Referring MD: Polite, Ronald, MD   Chief Complaint  Patient presents with  . Follow-up    CAD     History of Present Illness:    Ronald Hull is a 73 y.o. male with persistentatrial fibrillation,hypertension,hyperlipidemia, sleep apnea. He has undergone several cardioversions with return of atrial fibrillation. He was previously on Amiodarone. Most recently he was on Multaq.  He had recurrent atrial fibrillation in September 2019 while on a trip in [place].  He underwent cardioversion but went back into atrial fibrillation.  He underwent evaluation for PVI ablation.  CT scan did demonstrate significantly elevated calcium score and hemodynamically significant mid LAD and proximal LCx stenosis by FFR.  Therefore, he underwent cardiac catheterization which demonstrated severe mid RCA stenosis and severe proximal LCx stenosis.  Both lesions were treated with drug-eluting stents.  There was mild to moderate diffuse LAD stenosis with borderline FFR.  He was enrolled in the Xience 28 trial (Apixaban, clopidogrel and aspirin for 30 days; then discontinue clopidogrel and remain on aspirin and Apixaban).  He was last seen in 02/2018.  Since then, he underwent PVI ablation for his atrial fibrillation by Dr. Camnitz in 04/2018.  He was admitted several days later with chest pain and elevated Troponins.  There was no clear trend and the Troponin abnormality was felt to be related to the recent ablation.  Echo demonstrated normal LVF.     Mr. Sharps returns for follow up.  He is here alone.  He has not had any further chest pain since his ablation.  He denies shortness of breath, syncope, swelling, bleeding issues.    Prior CV studies:   The following studies were reviewed  today:  Echo 05/22/2018 Mild LVH, EF 60-65, no RWMA, restrictive physio, mild AI, mod MAC, mild MR, mild TR  Cardiac catheterization 02/24/2018 LAD proximal 50, mid 50 (DFR 0.89) LCx ostial 95 RCA proximal 30, mid 80 PCI: 3.5 x 15 mm Xience Sierra DES to the ostial LCx PCI: 4 x 15 mm Xience Sierra DES to the mid RCA    Cardiac CTA w/ Calcium Score 02/17/18 IMPRESSION: 1. There is normal pulmonary vein drainage into the left atrium. 2. The left atrial appendage is fairly small with no evidence for a thrombus. 3. The esophagus runs in the left atrial midline and is not in the proximity to any of the pulmonary veins. 4. The esophagus runs to the left from the left atrial midline and is in the proximity to the left common pulmonary vein trunk. 5. Calcium score is 3318 that correlates with 96 percentile for age/sex. We Ronald try to obtain CT FFR, if unable to process sec to motion, a functional nuclear study with D SPECT camera is recommended. 6. Mild calcifications of the aortic valve leaflet extending into the LVOT. 7. Posterior mitral annular calcifications  FFR 1. Left Main: No significant stenosis. 2. LAD: Proximal: 0.94, distal 0.77. 3. LCX: 0.5. 4. RCA: No significant stenosis.  IMPRESSION: 1. CT FFR analysis showed hemodynamically significant stenosis in the mid LAD and proximal LCX arteries. A cardiac catheterization is Recommended.  Echo 10/24/2015 Moderate LVH, EF 55-60, normal wall motion, trivial AI, mild MR, mild LAE, normal RVSF, mild RAE, PASP 29  Past Medical History:    Diagnosis Date  . Arthritis   . Bronchitis 05/23/2018  . CAD (coronary artery disease) 02/24/2018   LHC 10/19: pLAD 50, mLAD 50 (dFFR=0.89), oLCx 95, pRCA 30, mRCA 80 >> PCI: DES to LCx; DES to RCA // [facility] participant (Ronald DC Plavix after 30 d and remain on ASA + Apixaban)  . Chest pain secondary to ablation, with expected mild elevation in troponin 05/22/2018  .  Complication of anesthesia    BP dropped post colonoscopy  . Hypercholesteremia   . Hypertension   . Persistent atrial fibrillation   . Sleep apnea    does use a cpap  . Wears glasses    Surgical Hx: The patient  has a past surgical history that includes Colonoscopy; Knee arthroscopy (2000); Shoulder arthroscopy with rotator cuff repair and subacromial decompression (Right, 07/15/2012); Cardioversion (N/A, 11/01/2015); Cardioversion (N/A, 11/23/2015); Cardioversion (N/A, 10/12/2017); LEFT HEART CATH AND CORONARY ANGIOGRAPHY (N/A, 02/24/2018); INTRAVASCULAR PRESSURE WIRE/FFR STUDY (N/A, 02/24/2018); CORONARY STENT INTERVENTION (N/A, 02/24/2018); and ATRIAL FIBRILLATION ABLATION (N/A, 05/20/2018).   Current Medications: Current Meds  Medication Sig  . acetaminophen (TYLENOL) 325 MG tablet Take 650 mg by mouth every 6 (six) hours as needed (for pain.).  Marland Kitchen ASPIRIN LOW DOSE 81 MG chewable tablet Chew 81 mg by mouth every evening.   Marland Kitchen ELIQUIS 5 MG TABS tablet TAKE 1 TABLET(5 MG) BY MOUTH TWICE DAILY  . hydrocortisone cream 1 % Apply 1 application topically 2 (two) times daily as needed for itching.  . nitroGLYCERIN (NITROSTAT) 0.4 MG SL tablet Place 1 tablet (0.4 mg total) under the tongue every 5 (five) minutes as needed.     Allergies:   Lisinopril   Social History   Tobacco Use  . Smoking status: Former Smoker    Last attempt to quit: 07/09/1973    Years since quitting: 44.9  . Smokeless tobacco: Never Used  Substance Use Topics  . Alcohol use: Yes    Comment: beers daily  . Drug use: No     Family Hx: The patient's family history includes Cancer in his sister; Clotting disorder in his mother; Heart disease in his brother; Heart failure in his father; Hypertension in his sister; Other in his mother.  ROS:   Please see the history of present illness.    ROS All other systems reviewed and are negative.   EKGs/Labs/Other Test Reviewed:    EKG:  EKG is not ordered today.    Recent  Labs: 04/09/2018: ALT 26 05/22/2018: B Natriuretic Peptide 83.0 05/23/2018: BUN 14; Creatinine, Ser 1.02; Hemoglobin 12.1; Platelets 136; Potassium 4.3; Sodium 139   Recent Lipid Panel Lab Results  Component Value Date/Time   CHOL 96 (L) 04/09/2018 09:59 AM   TRIG 77 04/09/2018 09:59 AM   HDL 31 (L) 04/09/2018 09:59 AM   CHOLHDL 3.1 04/09/2018 09:59 AM   LDLCALC 50 04/09/2018 09:59 AM    Physical Exam:    VS:  BP 114/78   Pulse 72   Ht _0  (1.803 m)   Wt 269 lb 6.4 oz (122.2 kg)   SpO2 97%   BMI 37.57 kg/m     Wt Readings from Last 3 Encounters:  06/16/18 269 lb 6.4 oz (122.2 kg)  06/15/18 269 lb (122 kg)  05/23/18 272 lb (123.4 kg)     Physical Exam  Constitutional: He is oriented to person, place, and time. He appears well-developed and well-nourished. No distress.  HENT:  Head: Normocephalic and atraumatic.  Eyes: No scleral icterus.  Neck:  No JVD present. No thyromegaly present.  Cardiovascular: Normal rate and regular rhythm.  No murmur heard. Pulmonary/Chest: Effort normal. He has no wheezes. He has no rales.  Abdominal: Soft. He exhibits no distension.  Musculoskeletal:        General: No edema.  Lymphadenopathy:    He has no cervical adenopathy.  Neurological: He is alert and oriented to person, place, and time.  Skin: Skin is warm and dry.  Psychiatric: He has a normal mood and affect.    ASSESSMENT & PLAN:    Coronary artery disease involving native coronary artery of native heart without angina pectoris S/p DES to ostial LCx and DES to mid RCA in 01/2018.  He is not having any anginal symptoms.  He continues to go to cardiac rehabilitation.  He is now on ASA and Apixaban. Continue beta-blocker, statin.  FU with Dr. Burt Knack in 6 mos.   Persistent atrial fibrillation Maintaining NSR since his PVI ablation.  FU with Dr. Curt Bears as planned.  Continue Apixaban.  Essential hypertension The patient's blood pressure is controlled on his current regimen.   Continue current therapy.    Pure hypercholesterolemia   LDL optimal on most recent lab work.  Continue current Rx.      Dispo:  Return in about 6 months (around 12/15/2018) for Routine Follow Up, w/ Dr. Burt Knack.   Medication Adjustments/Labs and Tests Ordered: Current medicines are reviewed at length with the patient today.  Concerns regarding medicines are outlined above.  Tests Ordered: No orders of the defined types were placed in this encounter.  Medication Changes: No orders of the defined types were placed in this encounter.   Signed, Richardson Dopp, PA-C  06/16/2018 2:58 PM    [facility] [place], [place], [place]  [address] Phone: [phone]; Fax: [phone]

## 2018-06-16 NOTE — H&P (View-Only) (Signed)
Cardiology Office Note:    Date:  06/16/2018   ID:  Ronald Hull, DOB 1945/12/31, MRN [id number]  PCP:  Seward Carol, MD  Cardiologist:  Sherren Mocha, MD   Electrophysiologist:  Constance Haw, MD   Referring MD: Seward Carol, MD   Chief Complaint  Patient presents with  . Follow-up    CAD     History of Present Illness:    Ronald Hull is a 73 y.o. male with persistentatrial fibrillation,hypertension,hyperlipidemia, sleep apnea. He has undergone several cardioversions with return of atrial fibrillation. He was previously on Amiodarone. Most recently he was on Multaq.  He had recurrent atrial fibrillation in September 2019 while on a trip in [place].  He underwent cardioversion but went back into atrial fibrillation.  He underwent evaluation for PVI ablation.  CT scan did demonstrate significantly elevated calcium score and hemodynamically significant mid LAD and proximal LCx stenosis by FFR.  Therefore, he underwent cardiac catheterization which demonstrated severe mid RCA stenosis and severe proximal LCx stenosis.  Both lesions were treated with drug-eluting stents.  There was mild to moderate diffuse LAD stenosis with borderline FFR.  He was enrolled in the Xience 28 trial (Apixaban, clopidogrel and aspirin for 30 days; then discontinue clopidogrel and remain on aspirin and Apixaban).  He was last seen in 02/2018.  Since then, he underwent PVI ablation for his atrial fibrillation by Dr. Curt Bears in 04/2018.  He was admitted several days later with chest pain and elevated Troponins.  There was no clear trend and the Troponin abnormality was felt to be related to the recent ablation.  Echo demonstrated normal LVF.     [name] returns for follow up.  He is here alone.  He has not had any further chest pain since his ablation.  He denies shortness of breath, syncope, swelling, bleeding issues.    Prior CV studies:   The following studies were reviewed  today:  Echo 05/22/2018 Mild LVH, EF 60-65, no RWMA, restrictive physio, mild AI, mod MAC, mild MR, mild TR  Cardiac catheterization 02/24/2018 LAD proximal 50, mid 50 (DFR 0.89) LCx ostial 95 RCA proximal 30, mid 80 PCI: 3.5 x 15 mm Xience Sierra DES to the ostial LCx PCI: 4 x 15 mm Xience Sierra DES to the mid RCA    Cardiac CTA w/ Calcium Score 02/17/18 IMPRESSION: 1. There is normal pulmonary vein drainage into the left atrium. 2. The left atrial appendage is fairly small with no evidence for a thrombus. 3. The esophagus runs in the left atrial midline and is not in the proximity to any of the pulmonary veins. 4. The esophagus runs to the left from the left atrial midline and is in the proximity to the left common pulmonary vein trunk. 5. Calcium score is 3318 that correlates with 96 percentile for age/sex. We will try to obtain CT FFR, if unable to process sec to motion, a functional nuclear study with D SPECT camera is recommended. 6. Mild calcifications of the aortic valve leaflet extending into the LVOT. 7. Posterior mitral annular calcifications  FFR 1. Left Main: No significant stenosis. 2. LAD: Proximal: 0.94, distal 0.77. 3. LCX: 0.5. 4. RCA: No significant stenosis.  IMPRESSION: 1. CT FFR analysis showed hemodynamically significant stenosis in the mid LAD and proximal LCX arteries. A cardiac catheterization is Recommended.  Echo 10/24/2015 Moderate LVH, EF 55-60, normal wall motion, trivial AI, mild MR, mild LAE, normal RVSF, mild RAE, PASP 29  Past Medical History:  Diagnosis Date  . Arthritis   . Bronchitis 05/23/2018  . CAD (coronary artery disease) 02/24/2018   LHC 10/19: pLAD 50, mLAD 50 (dFFR=0.89), oLCx 95, pRCA 30, mRCA 80 >> PCI: DES to LCx; DES to RCA // [facility] participant (will DC Plavix after 30 d and remain on ASA + Apixaban)  . Chest pain secondary to ablation, with expected mild elevation in troponin 05/22/2018  .  Complication of anesthesia    BP dropped post colonoscopy  . Hypercholesteremia   . Hypertension   . Persistent atrial fibrillation   . Sleep apnea    does use a cpap  . Wears glasses    Surgical Hx: The patient  has a past surgical history that includes Colonoscopy; Knee arthroscopy (2000); Shoulder arthroscopy with rotator cuff repair and subacromial decompression (Right, 07/15/2012); Cardioversion (N/A, 11/01/2015); Cardioversion (N/A, 11/23/2015); Cardioversion (N/A, 10/12/2017); LEFT HEART CATH AND CORONARY ANGIOGRAPHY (N/A, 02/24/2018); INTRAVASCULAR PRESSURE WIRE/FFR STUDY (N/A, 02/24/2018); CORONARY STENT INTERVENTION (N/A, 02/24/2018); and ATRIAL FIBRILLATION ABLATION (N/A, 05/20/2018).   Current Medications: Current Meds  Medication Sig  . acetaminophen (TYLENOL) 325 MG tablet Take 650 mg by mouth every 6 (six) hours as needed (for pain.).  Marland Kitchen ASPIRIN LOW DOSE 81 MG chewable tablet Chew 81 mg by mouth every evening.   Marland Kitchen ELIQUIS 5 MG TABS tablet TAKE 1 TABLET(5 MG) BY MOUTH TWICE DAILY  . hydrocortisone cream 1 % Apply 1 application topically 2 (two) times daily as needed for itching.  . nitroGLYCERIN (NITROSTAT) 0.4 MG SL tablet Place 1 tablet (0.4 mg total) under the tongue every 5 (five) minutes as needed.     Allergies:   Lisinopril   Social History   Tobacco Use  . Smoking status: Former Smoker    Last attempt to quit: 07/09/1973    Years since quitting: 44.9  . Smokeless tobacco: Never Used  Substance Use Topics  . Alcohol use: Yes    Comment: beers daily  . Drug use: No     Family Hx: The patient's family history includes Cancer in his sister; Clotting disorder in his mother; Heart disease in his brother; Heart failure in his father; Hypertension in his sister; Other in his mother.  ROS:   Please see the history of present illness.    ROS All other systems reviewed and are negative.   EKGs/Labs/Other Test Reviewed:    EKG:  EKG is not ordered today.    Recent  Labs: 04/09/2018: ALT 26 05/22/2018: B Natriuretic Peptide 83.0 05/23/2018: BUN 14; Creatinine, Ser 1.02; Hemoglobin 12.1; Platelets 136; Potassium 4.3; Sodium 139   Recent Lipid Panel Lab Results  Component Value Date/Time   CHOL 96 (L) 04/09/2018 09:59 AM   TRIG 77 04/09/2018 09:59 AM   HDL 31 (L) 04/09/2018 09:59 AM   CHOLHDL 3.1 04/09/2018 09:59 AM   LDLCALC 50 04/09/2018 09:59 AM    Physical Exam:    VS:  BP 114/78   Pulse 72   Ht _0  (1.803 m)   Wt 269 lb 6.4 oz (122.2 kg)   SpO2 97%   BMI 37.57 kg/m     Wt Readings from Last 3 Encounters:  06/16/18 269 lb 6.4 oz (122.2 kg)  06/15/18 269 lb (122 kg)  05/23/18 272 lb (123.4 kg)     Physical Exam  Constitutional: He is oriented to person, place, and time. He appears well-developed and well-nourished. No distress.  HENT:  Head: Normocephalic and atraumatic.  Eyes: No scleral icterus.  Neck:  No JVD present. No thyromegaly present.  Cardiovascular: Normal rate and regular rhythm.  No murmur heard. Pulmonary/Chest: Effort normal. He has no wheezes. He has no rales.  Abdominal: Soft. He exhibits no distension.  Musculoskeletal:        General: No edema.  Lymphadenopathy:    He has no cervical adenopathy.  Neurological: He is alert and oriented to person, place, and time.  Skin: Skin is warm and dry.  Psychiatric: He has a normal mood and affect.    ASSESSMENT & PLAN:    Coronary artery disease involving native coronary artery of native heart without angina pectoris S/p DES to ostial LCx and DES to mid RCA in 01/2018.  He is not having any anginal symptoms.  He continues to go to cardiac rehabilitation.  He is now on ASA and Apixaban. Continue beta-blocker, statin.  FU with Dr. Burt Knack in 6 mos.   Persistent atrial fibrillation Maintaining NSR since his PVI ablation.  FU with Dr. Curt Bears as planned.  Continue Apixaban.  Essential hypertension The patient's blood pressure is controlled on his current regimen.   Continue current therapy.    Pure hypercholesterolemia   LDL optimal on most recent lab work.  Continue current Rx.      Dispo:  Return in about 6 months (around 12/15/2018) for Routine Follow Up, w/ Dr. Burt Knack.   Medication Adjustments/Labs and Tests Ordered: Current medicines are reviewed at length with the patient today.  Concerns regarding medicines are outlined above.  Tests Ordered: No orders of the defined types were placed in this encounter.  Medication Changes: No orders of the defined types were placed in this encounter.   Signed, Richardson Dopp, PA-C  06/16/2018 2:58 PM    [facility] [place], [place], [place]  [address] Phone: [phone]; Fax: [phone]

## 2018-06-17 ENCOUNTER — Other Ambulatory Visit: Payer: Self-pay | Admitting: Cardiology

## 2018-06-18 ENCOUNTER — Encounter (HOSPITAL_COMMUNITY)
Admission: RE | Admit: 2018-06-18 | Discharge: 2018-06-18 | Disposition: A | Discharge status: Home / Self Care | Payer: Medicare Other | Source: Ambulatory Visit | Attending: Cardiovascular Disease | Admitting: Cardiovascular Disease

## 2018-06-18 ENCOUNTER — Ambulatory Visit (HOSPITAL_COMMUNITY): Payer: Medicare Other

## 2018-06-18 DIAGNOSIS — Z955 Presence of coronary angioplasty implant and graft: Secondary | ICD-10-CM

## 2018-06-19 ENCOUNTER — Telehealth: Payer: Self-pay | Admitting: Cardiology

## 2018-06-19 DIAGNOSIS — Z7982 Long term (current) use of aspirin: Secondary | ICD-10-CM | POA: Diagnosis not present

## 2018-06-19 DIAGNOSIS — Z955 Presence of coronary angioplasty implant and graft: Secondary | ICD-10-CM | POA: Diagnosis not present

## 2018-06-19 DIAGNOSIS — I499 Cardiac arrhythmia, unspecified: Secondary | ICD-10-CM | POA: Diagnosis not present

## 2018-06-19 DIAGNOSIS — I4891 Unspecified atrial fibrillation: Secondary | ICD-10-CM | POA: Diagnosis not present

## 2018-06-19 DIAGNOSIS — R002 Palpitations: Secondary | ICD-10-CM | POA: Diagnosis not present

## 2018-06-19 DIAGNOSIS — E785 Hyperlipidemia, unspecified: Secondary | ICD-10-CM | POA: Diagnosis not present

## 2018-06-19 DIAGNOSIS — Z7901 Long term (current) use of anticoagulants: Secondary | ICD-10-CM | POA: Diagnosis not present

## 2018-06-19 DIAGNOSIS — I2510 Atherosclerotic heart disease of native coronary artery without angina pectoris: Secondary | ICD-10-CM | POA: Diagnosis not present

## 2018-06-19 NOTE — Telephone Encounter (Signed)
The patient called and reports that his AF has returned since his recent ablation. He was seen at an ED in [place], where he is visiting now. He requests a follow up appointment in the next few days. I told him I would let the day team know.

## 2018-06-20 DIAGNOSIS — Z7901 Long term (current) use of anticoagulants: Secondary | ICD-10-CM | POA: Diagnosis not present

## 2018-06-20 DIAGNOSIS — I4891 Unspecified atrial fibrillation: Secondary | ICD-10-CM | POA: Diagnosis not present

## 2018-06-20 DIAGNOSIS — I2510 Atherosclerotic heart disease of native coronary artery without angina pectoris: Secondary | ICD-10-CM | POA: Diagnosis not present

## 2018-06-20 DIAGNOSIS — E785 Hyperlipidemia, unspecified: Secondary | ICD-10-CM | POA: Diagnosis not present

## 2018-06-21 ENCOUNTER — Encounter (HOSPITAL_COMMUNITY): Payer: Medicare Other

## 2018-06-21 ENCOUNTER — Ambulatory Visit (HOSPITAL_COMMUNITY): Payer: Medicare Other

## 2018-06-22 ENCOUNTER — Ambulatory Visit (HOSPITAL_COMMUNITY): Payer: Medicare Other | Admitting: Nurse Practitioner

## 2018-06-22 ENCOUNTER — Telehealth: Payer: Self-pay | Admitting: Cardiology

## 2018-06-22 NOTE — Telephone Encounter (Signed)
Pt has appt with Dr. Elberta Fortis 08/24/18 at this time. I will route to Dory Horn, RN for Dr. Elberta Fortis as to if needing sooner appt.

## 2018-06-22 NOTE — Telephone Encounter (Signed)
New Message   PT is calling because he was in the hospital in [place] and they told him to follow up with his cardiologist. PT asked to speak with the nurse  Please call back

## 2018-06-23 ENCOUNTER — Encounter (HOSPITAL_COMMUNITY): Payer: Medicare Other

## 2018-06-23 ENCOUNTER — Ambulatory Visit (HOSPITAL_COMMUNITY): Payer: Medicare Other

## 2018-06-24 ENCOUNTER — Encounter (HOSPITAL_COMMUNITY): Payer: Self-pay | Admitting: Physician Assistant

## 2018-06-24 ENCOUNTER — Ambulatory Visit (HOSPITAL_COMMUNITY)
Admission: RE | Admit: 2018-06-24 | Discharge: 2018-06-24 | Disposition: A | Discharge status: Home / Self Care | Payer: Medicare Other | Source: Ambulatory Visit | Attending: Nurse Practitioner | Admitting: Nurse Practitioner

## 2018-06-24 VITALS — BP 118/82 | HR 76 | Ht 71.0 in | Wt 272.0 lb

## 2018-06-24 DIAGNOSIS — Z6837 Body mass index (BMI) 37.0-37.9, adult: Secondary | ICD-10-CM | POA: Insufficient documentation

## 2018-06-24 DIAGNOSIS — I10 Essential (primary) hypertension: Secondary | ICD-10-CM | POA: Insufficient documentation

## 2018-06-24 DIAGNOSIS — M1990 Unspecified osteoarthritis, unspecified site: Secondary | ICD-10-CM | POA: Insufficient documentation

## 2018-06-24 DIAGNOSIS — Z79899 Other long term (current) drug therapy: Secondary | ICD-10-CM | POA: Insufficient documentation

## 2018-06-24 DIAGNOSIS — Z8249 Family history of ischemic heart disease and other diseases of the circulatory system: Secondary | ICD-10-CM | POA: Insufficient documentation

## 2018-06-24 DIAGNOSIS — E7800 Pure hypercholesterolemia, unspecified: Secondary | ICD-10-CM | POA: Insufficient documentation

## 2018-06-24 DIAGNOSIS — Z7901 Long term (current) use of anticoagulants: Secondary | ICD-10-CM | POA: Diagnosis not present

## 2018-06-24 DIAGNOSIS — Z87891 Personal history of nicotine dependence: Secondary | ICD-10-CM | POA: Insufficient documentation

## 2018-06-24 DIAGNOSIS — Z809 Family history of malignant neoplasm, unspecified: Secondary | ICD-10-CM | POA: Diagnosis not present

## 2018-06-24 DIAGNOSIS — E669 Obesity, unspecified: Secondary | ICD-10-CM | POA: Insufficient documentation

## 2018-06-24 DIAGNOSIS — I2510 Atherosclerotic heart disease of native coronary artery without angina pectoris: Secondary | ICD-10-CM | POA: Insufficient documentation

## 2018-06-24 DIAGNOSIS — I4819 Other persistent atrial fibrillation: Secondary | ICD-10-CM | POA: Diagnosis not present

## 2018-06-24 DIAGNOSIS — G4733 Obstructive sleep apnea (adult) (pediatric): Secondary | ICD-10-CM | POA: Diagnosis not present

## 2018-06-24 DIAGNOSIS — Z888 Allergy status to other drugs, medicaments and biological substances status: Secondary | ICD-10-CM | POA: Diagnosis not present

## 2018-06-24 DIAGNOSIS — R9431 Abnormal electrocardiogram [ECG] [EKG]: Secondary | ICD-10-CM | POA: Insufficient documentation

## 2018-06-24 LAB — CBC
HCT: 45.1 % (ref 39.0–52.0)
Hemoglobin: 14.6 g/dL (ref 13.0–17.0)
MCH: 31.2 pg (ref 26.0–34.0)
MCHC: 32.4 g/dL (ref 30.0–36.0)
MCV: 96.4 fL (ref 80.0–100.0)
Platelets: 167 10*3/uL (ref 150–400)
RBC: 4.68 MIL/uL (ref 4.22–5.81)
RDW: 13.8 % (ref 11.5–15.5)
WBC: 6.9 10*3/uL (ref 4.0–10.5)
nRBC: 0 % (ref 0.0–0.2)

## 2018-06-24 LAB — BASIC METABOLIC PANEL
Anion gap: 4 — ABNORMAL LOW (ref 5–15)
BUN: 11 mg/dL (ref 8–23)
CO2: 26 mmol/L (ref 22–32)
Calcium: 8.8 mg/dL — ABNORMAL LOW (ref 8.9–10.3)
Chloride: 106 mmol/L (ref 98–111)
Creatinine, Ser: 0.98 mg/dL (ref 0.61–1.24)
GFR calc Af Amer: >60 mL/min (ref >60)
GFR calc non Af Amer: >60 mL/min (ref >60)
Glucose, Bld: 101 mg/dL — ABNORMAL HIGH (ref 70–99)
Potassium: 4.3 mmol/L (ref 3.5–5.1)
Sodium: 136 mmol/L (ref 135–145)

## 2018-06-24 NOTE — Progress Notes (Signed)
I have reviewed a Home Exercise Prescription with [name] . Ronald Hull is currently exercising at home. The patient was advised to continue to walk 3-4 days a week for 20-35 minutes.  [name] and I discussed how to progress their exercise prescription. The patient stated that they understand the exercise prescription.  We reviewed exercise guidelines, target heart rate during exercise, RPE Scale, weather conditions, NTG use, endpoints for exercise, warmup and cool down.  Patient is encouraged to come to me with any questions. I will continue to follow up with the patient to assist them with progression and safety.     York Cerise MS, ACSM CEP 4:03 PM 06/11/2018

## 2018-06-24 NOTE — Patient Instructions (Signed)
Cardioversion scheduled for Wednesday, March 4th  - Arrive at the [facility] and go to admitting at 1:30PM  -Do not eat or drink anything after midnight the night prior to your procedure.  - Take all your morning medication with a sip of water prior to arrival.  - You will not be able to drive home after your procedure.

## 2018-06-24 NOTE — Progress Notes (Signed)
Primary Care Physician: Renford Dills, MD Referring Physician: Dr. Elberta Fortis Cardiologist: Dr. Madolyn Frieze Ronald Hull is a 73 y.o. male with a h/o persistent afib, CAD, that is in the afib clinic for f/u. Patient reports that he felt well immediately after his afib ablation with much more energy and increased exercise tolerance at cardiac rehab. However, about one week ago while traveling in [place], he became more fatigued and noted on his watch that his HR was elevated to about 150 bpm. He went to the local ER and was rate controlled and offered DCCV which he declined stating he preferred to follow up with his usual doctors. He remains in rate controlled afib today.  Today, he denies symptoms of palpitations, chest pain, orthopnea, PND, lower extremity edema, dizziness, presyncope, syncope, or neurologic sequela. The patient is tolerating medications without difficulties and is otherwise without complaint today.   Past Medical History:  Diagnosis Date  . Arthritis   . Bronchitis 05/23/2018  . CAD (coronary artery disease) 02/24/2018   LHC 10/19: pLAD 50, mLAD 50 (dFFR=0.89), oLCx 95, pRCA 30, mRCA 80 >> PCI: DES to LCx; DES to RCA // [id number] Trial participant (will DC Plavix after 30 d and remain on ASA + Apixaban)  . Chest pain secondary to ablation, with expected mild elevation in troponin 05/22/2018  . Complication of anesthesia    BP dropped post colonoscopy  . Hypercholesteremia   . Hypertension   . Persistent atrial fibrillation   . Sleep apnea    does use a cpap  . Wears glasses    Past Surgical History:  Procedure Laterality Date  . ATRIAL FIBRILLATION ABLATION N/A 05/20/2018   Procedure: ATRIAL FIBRILLATION ABLATION;  Surgeon: Regan Lemming, MD;  Location: [facility] INVASIVE CV LAB;  Service: Cardiovascular;  Laterality: N/A;  . CARDIOVERSION N/A 11/01/2015   Procedure: CARDIOVERSION;  Surgeon: [name], MD;  Location: [facility] ENDOSCOPY;  Service: Cardiovascular;   Laterality: N/A;  . CARDIOVERSION N/A 11/23/2015   Procedure: CARDIOVERSION;  Surgeon: Will [name], MD;  Location: [facility] ENDOSCOPY;  Service: Cardiovascular;  Laterality: N/A;  . CARDIOVERSION N/A 10/12/2017   Procedure: CARDIOVERSION;  Surgeon: [name], MD;  Location: [facility] ENDOSCOPY;  Service: Cardiovascular;  Laterality: N/A;  . COLONOSCOPY    . CORONARY STENT INTERVENTION N/A 02/24/2018   Procedure: CORONARY STENT INTERVENTION;  Surgeon: Tonny Bollman, MD;  Location: [facility] INVASIVE CV LAB;  Service: Cardiovascular;  Laterality: N/A;  . INTRAVASCULAR PRESSURE WIRE/FFR STUDY N/A 02/24/2018   Procedure: INTRAVASCULAR PRESSURE WIRE/FFR STUDY;  Surgeon: Tonny Bollman, MD;  Location: [facility] INVASIVE CV LAB;  Service: Cardiovascular;  Laterality: N/A;  . KNEE ARTHROSCOPY  2000  . LEFT HEART CATH AND CORONARY ANGIOGRAPHY N/A 02/24/2018   Procedure: LEFT HEART CATH AND CORONARY ANGIOGRAPHY;  Surgeon: Tonny Bollman, MD;  Location: [facility] INVASIVE CV LAB;  Service: Cardiovascular;  Laterality: N/A;  . SHOULDER ARTHROSCOPY WITH ROTATOR CUFF REPAIR AND SUBACROMIAL DECOMPRESSION Right 07/15/2012   Procedure: RIGHT ARTHROSCOPY SHOULDER DECOMPRESSION  SUBACROMIAL PARTIAL ACROMIOPLASTY WITH CORACOACROMIAL RELEASE,  DISTAL CLAVICULECTOMY, resect biceps debride labrium WITH ROTATOR CUFF REPAIR;  Surgeon: [name], MD;  Location: [facility];  Service: Orthopedics;  Laterality: Right;    Current Outpatient Medications  Medication Sig Dispense Refill  . acetaminophen (TYLENOL) 325 MG tablet Take 650 mg by mouth every 6 (six) hours as needed (for pain.).    Marland Kitchen ASPIRIN LOW DOSE 81 MG chewable tablet Chew 81 mg by mouth  every evening.   0  . atorvastatin (LIPITOR) 40 MG tablet Take 1 tablet (40 mg total) by mouth daily. 90 tablet 3  . ELIQUIS 5 MG TABS tablet TAKE 1 TABLET(5 MG) BY MOUTH TWICE DAILY 60 tablet 5  . hydrocortisone cream 1 % Apply 1 application topically 2 (two) times daily as  needed for itching.    . metoprolol tartrate (LOPRESSOR) 50 MG tablet Take 1 tablet (50 mg total) by mouth 2 (two) times daily. 180 tablet 3  . nitroGLYCERIN (NITROSTAT) 0.4 MG SL tablet Place 1 tablet (0.4 mg total) under the tongue every 5 (five) minutes as needed. 25 tablet 2   No current facility-administered medications for this encounter.     Allergies  Allergen Reactions  . Lisinopril Cough    Social History   Socioeconomic History  . Marital status: Married    Spouse name: Not on file  . Number of children: Not on file  . Years of education: Not on file  . Highest education level: Not on file  Occupational History  . Not on file  Social Needs  . Financial resource strain: Not very hard  . Food insecurity:    Worry: Never true    Inability: Never true  . Transportation needs:    Medical: No    Non-medical: No  Tobacco Use  . Smoking status: Former Smoker    Last attempt to quit: 07/09/1973    Years since quitting: 44.9  . Smokeless tobacco: Never Used  Substance and Sexual Activity  . Alcohol use: Yes    Comment: beers daily  . Drug use: No  . Sexual activity: Not Currently    Birth control/protection: Abstinence  Lifestyle  . Physical activity:    Days per week: Not on file    Minutes per session: Not on file  . Stress: Not on file  Relationships  . Social connections:    Talks on phone: Not on file    Gets together: Not on file    Attends religious service: Not on file    Active member of club or organization: Not on file    Attends meetings of clubs or organizations: Not on file    Relationship status: Not on file  . Intimate partner violence:    Fear of current or ex partner: No    Emotionally abused: No    Physically abused: No    Forced sexual activity: No  Other Topics Concern  . Not on file  Social History Narrative  . Not on file    Family History  Problem Relation Age of Onset  . Heart failure Father   . Other Mother        bad fall   . Clotting disorder Mother   . Cancer Sister   . Hypertension Sister   . Heart disease Brother     ROS- All systems are reviewed and negative except as per the HPI above  Physical Exam: Vitals:   06/24/18 0837  Weight: 123.4 kg  Height: 5\' 11"  (1.803 m)   Wt Readings from Last 3 Encounters:  06/24/18 123.4 kg  06/16/18 122.2 kg  06/15/18 122 kg    Labs: Lab Results  Component Value Date   NA 139 05/23/2018   K 4.3 05/23/2018   CL 102 05/23/2018   CO2 27 05/23/2018   GLUCOSE 102 (H) 05/23/2018   BUN 14 05/23/2018   CREATININE 1.02 05/23/2018   CALCIUM 8.2 (L) 05/23/2018   No results  found for: INR Lab Results  Component Value Date   CHOL 96 (L) 04/09/2018   HDL 31 (L) 04/09/2018   LDLCALC 50 04/09/2018   TRIG 77 04/09/2018    GEN- The patient is well appearing obese male, alert and oriented x 3 today.   HEENT-head normocephalic, atraumatic, sclera clear, conjunctiva pink, hearing intact, trachea midline. Lungs- Clear to ausculation bilaterally, normal work of breathing Heart- irregular rate and rhythm, no murmurs, rubs or gallops  GI- soft, NT, ND, + BS Extremities- no clubbing, cyanosis, or edema MS- no significant deformity or atrophy Skin- no rash or lesion Psych- euthymic mood, full affect Neuro- strength and sensation are intact   EKG- afib HR 76, QRS 98, QTc 389, motion artifact.   Echo 05/22/18 demonstrates - Procedure narrative: Transthoracic echocardiography. Image   quality was poor. The study was technically difficult, as a   result of poor acoustic windows. Intravenous contrast (Definity)   was administered. - Left ventricle: The cavity size was normal. Wall thickness was increased in a pattern of mild LVH. Systolic function was normal.   The estimated ejection fraction was in the range of 60% to 65%.   Wall motion was normal; there were no regional wall motion   abnormalities. Doppler parameters are consistent with restrictive   physiology,  indicative of decreased left ventricular diastolic   compliance and/or increased left atrial pressure. Doppler   parameters are consistent with high ventricular filling pressure. - Aortic valve: Mildly calcified annulus. Trileaflet. There was   mild regurgitation. - Mitral valve: Moderately calcified annulus. Normal thickness   leaflets . There was mild regurgitation. - Tricuspid valve: There was mild regurgitation. - Systemic veins: IVC poorly visualized but appears dilated.  Assessment and Plan: 1. Persistent atrial fibrillation S/p ablation 05/20/18 with Dr Elberta Fortis. Patient enjoyed SR for about one month post ablation but is now back in persistent afib. Will arrange DCCV. Patient reports no missed doses of anticoagulation. Check Bmet/CBC  This patients CHA2DS2-VASc Score and unadjusted Ischemic Stroke Rate (% per year) is equal to 3.2 % stroke rate/year from a score of 3  Above score calculated as 1 point each if present [CHF, HTN, DM, Vascular=MI/PAD/Aortic Plaque, Age if 65-74, or Male] Above score calculated as 2 points each if present [Age > 75, or Stroke/TIA/TE]  2. CAD S/p DES to RCA and LCx. Currently no anginal symptoms Continue present therapy and risk factor modification.  3. HTN  Stable, no changes today.  4. OSA Compliant with CPAP therapy.  5. Obesity Encouraged lifestyle modifications including diet and exercise. Continue cardiac rehab. Patient reports he is doing well with this and has lost 30 lbs.  Follow up one week post DCCV. Follow up with Dr Elberta Fortis as scheduled.   [name] PA-C [facility] [address] [place], [place] [address] [phone]

## 2018-06-25 ENCOUNTER — Encounter (HOSPITAL_COMMUNITY)
Admission: RE | Admit: 2018-06-25 | Discharge: 2018-06-25 | Disposition: A | Discharge status: Home / Self Care | Payer: Medicare Other | Source: Ambulatory Visit | Attending: Cardiovascular Disease | Admitting: Cardiovascular Disease

## 2018-06-25 ENCOUNTER — Ambulatory Visit (HOSPITAL_COMMUNITY): Payer: Medicare Other

## 2018-06-25 DIAGNOSIS — Z955 Presence of coronary angioplasty implant and graft: Secondary | ICD-10-CM

## 2018-06-28 ENCOUNTER — Ambulatory Visit (HOSPITAL_COMMUNITY): Payer: Medicare Other

## 2018-06-28 ENCOUNTER — Encounter (HOSPITAL_COMMUNITY)
Admission: RE | Admit: 2018-06-28 | Discharge: 2018-06-28 | Disposition: A | Discharge status: Home / Self Care | Payer: Medicare Other | Source: Ambulatory Visit | Attending: Cardiovascular Disease | Admitting: Cardiovascular Disease

## 2018-06-28 DIAGNOSIS — Z955 Presence of coronary angioplasty implant and graft: Secondary | ICD-10-CM | POA: Diagnosis not present

## 2018-06-30 ENCOUNTER — Ambulatory Visit (HOSPITAL_COMMUNITY): Payer: Medicare Other | Admitting: Certified Registered Nurse Anesthetist

## 2018-06-30 ENCOUNTER — Ambulatory Visit (HOSPITAL_COMMUNITY): Payer: Medicare Other

## 2018-06-30 ENCOUNTER — Encounter (HOSPITAL_COMMUNITY)
Admission: RE | Disposition: A | Discharge status: Home / Self Care | Payer: Self-pay | Source: Home / Self Care | Attending: Cardiology

## 2018-06-30 ENCOUNTER — Encounter (HOSPITAL_COMMUNITY): Payer: Medicare Other

## 2018-06-30 ENCOUNTER — Ambulatory Visit (HOSPITAL_COMMUNITY)
Admission: RE | Admit: 2018-06-30 | Discharge: 2018-06-30 | Disposition: A | Discharge status: Home / Self Care | Payer: Medicare Other | Attending: Cardiology | Admitting: Cardiology

## 2018-06-30 ENCOUNTER — Other Ambulatory Visit: Payer: Self-pay

## 2018-06-30 ENCOUNTER — Encounter (HOSPITAL_COMMUNITY): Payer: Self-pay | Admitting: *Deleted

## 2018-06-30 DIAGNOSIS — Z888 Allergy status to other drugs, medicaments and biological substances status: Secondary | ICD-10-CM | POA: Diagnosis not present

## 2018-06-30 DIAGNOSIS — Z7901 Long term (current) use of anticoagulants: Secondary | ICD-10-CM | POA: Insufficient documentation

## 2018-06-30 DIAGNOSIS — I4819 Other persistent atrial fibrillation: Secondary | ICD-10-CM | POA: Diagnosis not present

## 2018-06-30 DIAGNOSIS — G4730 Sleep apnea, unspecified: Secondary | ICD-10-CM | POA: Diagnosis not present

## 2018-06-30 DIAGNOSIS — E785 Hyperlipidemia, unspecified: Secondary | ICD-10-CM | POA: Insufficient documentation

## 2018-06-30 DIAGNOSIS — I4891 Unspecified atrial fibrillation: Secondary | ICD-10-CM | POA: Diagnosis not present

## 2018-06-30 DIAGNOSIS — Z955 Presence of coronary angioplasty implant and graft: Secondary | ICD-10-CM | POA: Diagnosis not present

## 2018-06-30 DIAGNOSIS — I480 Paroxysmal atrial fibrillation: Secondary | ICD-10-CM

## 2018-06-30 DIAGNOSIS — E7800 Pure hypercholesterolemia, unspecified: Secondary | ICD-10-CM | POA: Insufficient documentation

## 2018-06-30 DIAGNOSIS — Z87891 Personal history of nicotine dependence: Secondary | ICD-10-CM | POA: Diagnosis not present

## 2018-06-30 DIAGNOSIS — Z7902 Long term (current) use of antithrombotics/antiplatelets: Secondary | ICD-10-CM | POA: Insufficient documentation

## 2018-06-30 DIAGNOSIS — I2510 Atherosclerotic heart disease of native coronary artery without angina pectoris: Secondary | ICD-10-CM | POA: Diagnosis not present

## 2018-06-30 DIAGNOSIS — I10 Essential (primary) hypertension: Secondary | ICD-10-CM | POA: Insufficient documentation

## 2018-06-30 HISTORY — PX: CARDIOVERSION: SHX1299

## 2018-06-30 SURGERY — CARDIOVERSION
Anesthesia: General

## 2018-06-30 MED ORDER — PROPOFOL 10 MG/ML IV BOLUS
INTRAVENOUS | Status: DC | PRN
  Administered 2018-06-30: 70 mg via INTRAVENOUS
  Administered 2018-06-30: 30 mg via INTRAVENOUS

## 2018-06-30 MED ORDER — LIDOCAINE 2% (20 MG/ML) 5 ML SYRINGE
INTRAMUSCULAR | Status: DC | PRN
  Administered 2018-06-30: 100 mg via INTRAVENOUS

## 2018-06-30 MED ORDER — SODIUM CHLORIDE 0.9 % IV SOLN
INTRAVENOUS | Status: DC | PRN
  Administered 2018-06-30: 15:00:00 via INTRAVENOUS

## 2018-06-30 NOTE — Telephone Encounter (Signed)
Pt seen 2/27 in AFib clinic.

## 2018-06-30 NOTE — Interval H&P Note (Signed)
History and Physical Interval Note:  06/30/2018 3:07 PM  Ronald Hull  has presented today for surgery, with the diagnosis of afib  The various methods of treatment have been discussed with the patient and family. After consideration of risks, benefits and other options for treatment, the patient has consented to  Procedure(s): CARDIOVERSION (N/A) as a surgical intervention .  The patient's history has been reviewed, patient examined, no change in status, stable for surgery.  I have reviewed the patient's chart and labs.  Questions were answered to the patient's satisfaction.     [employer]

## 2018-06-30 NOTE — Anesthesia Preprocedure Evaluation (Signed)
Anesthesia Evaluation  Patient identified by MRN, date of birth, ID band Patient awake    Reviewed: Allergy & Precautions, NPO status , Patient's Chart, lab work & pertinent test results  Airway Mallampati: I  TM Distance: >3 FB Neck ROM: Full    Dental   Pulmonary sleep apnea , former smoker,    Pulmonary exam normal        Cardiovascular hypertension, + CAD  Normal cardiovascular exam+ dysrhythmias Atrial Fibrillation      Neuro/Psych    GI/Hepatic   Endo/Other    Renal/GU      Musculoskeletal   Abdominal   Peds  Hematology   Anesthesia Other Findings   Reproductive/Obstetrics                             Anesthesia Physical Anesthesia Plan  ASA: III  Anesthesia Plan: General   Post-op Pain Management:    Induction: Intravenous  PONV Risk Score and Plan: 2 and Treatment may vary due to age or medical condition  Airway Management Planned: Mask  Additional Equipment:   Intra-op Plan:   Post-operative Plan:   Informed Consent: I have reviewed the patients History and Physical, chart, labs and discussed the procedure including the risks, benefits and alternatives for the proposed anesthesia with the patient or authorized representative who has indicated his/her understanding and acceptance.       Plan Discussed with: CRNA and Surgeon  Anesthesia Plan Comments:         Anesthesia Quick Evaluation

## 2018-06-30 NOTE — Anesthesia Postprocedure Evaluation (Signed)
Anesthesia Post Note  Patient: Ronald Hull  Procedure(s) Performed: CARDIOVERSION (N/A )     Patient location during evaluation: PACU Anesthesia Type: General Level of consciousness: awake and alert Pain management: pain level controlled Vital Signs Assessment: post-procedure vital signs reviewed and stable Respiratory status: spontaneous breathing, nonlabored ventilation, respiratory function stable and patient connected to nasal cannula oxygen Cardiovascular status: blood pressure returned to baseline and stable Postop Assessment: no apparent nausea or vomiting Anesthetic complications: no    Last Vitals:  Vitals:   06/30/18 1550 06/30/18 1600  BP: 139/77 125/68  Pulse: 64 61  Resp: 18 16  Temp:    SpO2: 97% 98%    Last Pain:  Vitals:   06/30/18 1547  TempSrc: Oral  PainSc: 0-No pain                 Norwood Quezada DAVID

## 2018-06-30 NOTE — CV Procedure (Signed)
    Electrical Cardioversion Procedure Note Ronald Hull [id number] 24-Dec-1945  Procedure: Electrical Cardioversion Indications:  Atrial Fibrillation  Time Out: Verified patient identification, verified procedure,medications/allergies/relevent history reviewed, required imaging and test results available.  Performed  Procedure Details  The patient was NPO after midnight. Anesthesia was administered at the beside  by [facility] with propofol.  Cardioversion was performed with synchronized biphasic defibrillation via AP pads with 120, 200 joules.  2 attempt(s) were performed.  The patient converted to normal sinus rhythm. The patient tolerated the procedure well   IMPRESSION:  Successful cardioversion of atrial fibrillation    Ronald Hull 06/30/2018, 3:38 PM

## 2018-06-30 NOTE — Transfer of Care (Signed)
Immediate Anesthesia Transfer of Care Note  Patient: Ronald Hull  Procedure(s) Performed: CARDIOVERSION (N/A )  Patient Location: Endoscopy Unit  Anesthesia Type:General  Level of Consciousness: drowsy and responds to stimulation  Airway & Oxygen Therapy: Patient Spontanous Breathing  Post-op Assessment: Report given to RN and Post -op Vital signs reviewed and stable  Post vital signs: Reviewed and stable  Last Vitals:  Vitals Value Taken Time  BP 125/97 06/30/2018  3:36 PM  Temp    Pulse 71 06/30/2018  3:38 PM  Resp 20 06/30/2018  3:38 PM  SpO2 95 % 06/30/2018  3:38 PM    Last Pain:  Vitals:   06/30/18 1514  TempSrc: Oral  PainSc: 0-No pain         Complications: No apparent anesthesia complications

## 2018-07-01 NOTE — Progress Notes (Signed)
Cardiac Individual Treatment Plan  Patient Details  Name: Ronald Hull MRN: [id number] Date of Birth: [birth year] Referring Provider:     CARDIAC REHAB PHASE II ORIENTATION from 05/06/2018 in [facility] CARDIAC [facility]  Referring Provider  Tonny Bollman MD       Initial Encounter Date:    CARDIAC REHAB PHASE II ORIENTATION from 05/06/2018 in [facility] CARDIAC REHAB  Date  05/06/18      Visit Diagnosis: S/P drug eluting coronary stent placement  Patient's Home Medications on Admission:  Current Outpatient Medications:  .  acetaminophen (TYLENOL) 325 MG tablet, Take 325 mg by mouth every 6 (six) hours as needed (for pain.). , Disp: , Rfl:  .  ASPIRIN LOW DOSE 81 MG chewable tablet, Chew 81 mg by mouth every evening. , Disp: , Rfl: 0 .  atorvastatin (LIPITOR) 40 MG tablet, Take 1 tablet (40 mg total) by mouth daily. (Patient taking differently: Take 40 mg by mouth at bedtime. ), Disp: 90 tablet, Rfl: 3 .  ELIQUIS 5 MG TABS tablet, TAKE 1 TABLET(5 MG) BY MOUTH TWICE DAILY (Patient taking differently: Take 5 mg by mouth 2 (two) times daily. ), Disp: 60 tablet, Rfl: 5 .  metoprolol tartrate (LOPRESSOR) 50 MG tablet, Take 1 tablet (50 mg total) by mouth 2 (two) times daily., Disp: 180 tablet, Rfl: 3 .  nitroGLYCERIN (NITROSTAT) 0.4 MG SL tablet, Place 1 tablet (0.4 mg total) under the tongue every 5 (five) minutes as needed. (Patient taking differently: Place 0.4 mg under the tongue every 5 (five) minutes as needed for chest pain. ), Disp: 25 tablet, Rfl: 2 .  zaleplon (SONATA) 5 MG capsule, Take 5 mg by mouth at bedtime as needed for sleep., Disp: , Rfl:   Past Medical History: Past Medical History:  Diagnosis Date  . Arthritis   . Bronchitis 05/23/2018  . CAD (coronary artery disease) 02/24/2018   LHC 10/19: pLAD 50, mLAD 50 (dFFR=0.89), oLCx 95, pRCA 30, mRCA 80 >> PCI: DES to LCx; DES to RCA // [id number] Trial participant (will DC Plavix after 30 d  and remain on ASA + Apixaban)  . Chest pain secondary to ablation, with expected mild elevation in troponin 05/22/2018  . Complication of anesthesia    BP dropped post colonoscopy  . Hypercholesteremia   . Hypertension   . Persistent atrial fibrillation   . Sleep apnea    does use a cpap  . Wears glasses     Tobacco Use: Social History   Tobacco Use  Smoking Status Former Smoker  . Last attempt to quit: 07/09/1973  . Years since quitting: 45.0  Smokeless Tobacco Never Used    Labs: Recent Review Advice worker    Labs for ITP Cardiac and Pulmonary Rehab Latest Ref Rng & Units 11/23/2015 04/09/2018   Cholestrol 100 - 199 mg/dL - 40(J)   LDLCALC 0 - 99 mg/dL - 50   HDL >81 mg/dL - 19(J)   Trlycerides 0 - 149 mg/dL - 77   TCO2 0 - 478 mmol/L 26 -      Capillary Blood Glucose: No results found for: GLUCAP   Exercise Target Goals: Exercise Program Goal: Individual exercise prescription set using results from initial 6 min walk test and THRR while considering  patient's activity barriers and safety.   Exercise Prescription Goal: Initial exercise prescription builds to 30-45 minutes a day of aerobic activity, 2-3 days per week.  Home exercise guidelines will be given to patient  during program as part of exercise prescription that the participant will acknowledge.  Activity Barriers & Risk Stratification: Activity Barriers & Cardiac Risk Stratification - 05/06/18 1047      Activity Barriers & Cardiac Risk Stratification   Activity Barriers  Deconditioning;Balance Concerns;Other (comment)    Comments  Occasional Knee Pain    Cardiac Risk Stratification  Moderate       6 Minute Walk: 6 Minute Walk    Row Name 05/06/18 1044 05/06/18 1121       6 Minute Walk   Phase  Initial  -    Distance  1478 feet  -    Walk Time  6 minutes  -    # of Rest Breaks  0  -    MPH  2.79  -    METS  2.76  -    RPE  11  -    Perceived Dyspnea   0  -    VO2 Peak  9.65  -    Symptoms   No  Yes (comment)    Comments  -  Mild SOB +1    Resting HR  73 bpm  -    Resting BP  120/70  -    Resting Oxygen Saturation   95 %  -    Exercise Oxygen Saturation  during 6 min walk  99 %  -    Max Ex. HR  134 bpm  -    Max Ex. BP  130/74  -    2 Minute Post BP  110/76  -       Oxygen Initial Assessment:   Oxygen Re-Evaluation:   Oxygen Discharge (Final Oxygen Re-Evaluation):   Initial Exercise Prescription: Initial Exercise Prescription - 05/06/18 1000      Date of Initial Exercise RX and Referring Provider   Date  05/06/18    Referring Provider  Tonny Bollman MD     Expected Discharge Date  08/13/18      NuStep   Level  2    SPM  75    Minutes  10    METs  2.3      Arm Ergometer   Level  2.5    Watts  40    Minutes  10    METs  2.68      Track   Laps  11    Minutes  10    METs  2.91      Prescription Details   Frequency (times per week)  3x    Duration  Progress to 30 minutes of continuous aerobic without signs/symptoms of physical distress      Intensity   THRR 40-80% of Max Heartrate  5-118    Ratings of Perceived Exertion  11-13    Perceived Dyspnea  0-4      Progression   Progression  Continue progressive overload as per policy without signs/symptoms or physical distress.      Resistance Training   Training Prescription  Yes    Weight  3lbs    Reps  10-15       Perform Capillary Blood Glucose checks as needed.  Exercise Prescription Changes: Exercise Prescription Changes    Row Name 05/10/18 1031 05/14/18 1030 05/31/18 1427 06/18/18 1610       Response to Exercise   Blood Pressure (Admit)  120/70  116/70  118/62  120/72    Blood Pressure (Exercise)  138/82  120/72  120/80  128/80  Blood Pressure (Exit)  116/60  120/80  120/80  102/66    Heart Rate (Admit)  97 bpm  84 bpm  57 bpm  64 bpm    Heart Rate (Exercise)  145 bpm  136 bpm  104 bpm  108 bpm    Heart Rate (Exit)  95 bpm  92 bpm  57 bpm  74 bpm    Rating of Perceived  Exertion (Exercise)  11  11  12  12     Perceived Dyspnea (Exercise)  0  0  0  0    Symptoms  Elevated HR   None  None  None    Comments  Pt oriented to exercise equipment  None  Pt's first day back since on medical hold   None    Duration  Progress to 30 minutes of  aerobic without signs/symptoms of physical distress  Progress to 30 minutes of  aerobic without signs/symptoms of physical distress  Progress to 30 minutes of  aerobic without signs/symptoms of physical distress  Continue with 30 min of aerobic exercise without signs/symptoms of physical distress.    Intensity  THRR unchanged  THRR unchanged  THRR unchanged  THRR unchanged      Progression   Progression  Continue to progress workloads to maintain intensity without signs/symptoms of physical distress.  Continue to progress workloads to maintain intensity without signs/symptoms of physical distress.  Continue to progress workloads to maintain intensity without signs/symptoms of physical distress.  Continue to progress workloads to maintain intensity without signs/symptoms of physical distress.    Average METs  2.23  2.52  2.69  2.64      Resistance Training   Training Prescription  Yes  Yes  No  Yes    Weight  3lbs  3lbs  3lbs  5lbs    Reps  10-15  10-15  10-15  10-15    Time  10 Minutes  10 Minutes  10 Minutes  10 Minutes      NuStep   Level  2  2  2  4     SPM  75  75  80  95    Minutes  10  10  10  10     METs  2.1  2.2  2.8  3.3      Arm Ergometer   Level  2.5  2.5  2.5  2.5    Watts  40  40  40  40    Minutes  10  10  10  10     METs  2.04  2.05  2.05  2.07      Track   Laps  9  13  13  14     Minutes  10  10  10  10     METs  2.53  3.3  3.3  3.45      Home Exercise Plan   Plans to continue exercise at  -  -  -  Home (comment)    Frequency  -  -  -  Add 2 additional days to program exercise sessions.    Initial Home Exercises Provided  -  -  -  06/11/18       Exercise Comments: Exercise Comments    Row Name  05/10/18 1033 06/03/18 1429 06/11/18 1602       Exercise Comments  Pt's first day of exercise. Pt oriented to exercise equipment. Pt responded well to workloads. Will continue to monitor and progress as tolerated.  Pt has returned to exercise from ablation procedure. Pt is responding well to exercise workloads. Will continue to monitor and progress pt as tolerated. Will follow up with pt regarding home exercise plan.   Reviewed HEP with pt. Will continue to monitor and progress pt as tolerated.         Exercise Goals and Review: Exercise Goals    Row Name 05/06/18 1041             Exercise Goals   Increase Physical Activity  Yes       Intervention  Provide advice, education, support and counseling about physical activity/exercise needs.;Develop an individualized exercise prescription for aerobic and resistive training based on initial evaluation findings, risk stratification, comorbidities and participant's personal goals.       Expected Outcomes  Short Term: Attend rehab on a regular basis to increase amount of physical activity.;Long Term: Add in home exercise to make exercise part of routine and to increase amount of physical activity.;Long Term: Exercising regularly at least 3-5 days a week.       Increase Strength and Stamina  Yes       Intervention  Provide advice, education, support and counseling about physical activity/exercise needs.;Develop an individualized exercise prescription for aerobic and resistive training based on initial evaluation findings, risk stratification, comorbidities and participant's personal goals.       Expected Outcomes  Short Term: Increase workloads from initial exercise prescription for resistance, speed, and METs.;Short Term: Perform resistance training exercises routinely during rehab and add in resistance training at home;Long Term: Improve cardiorespiratory fitness, muscular endurance and strength as measured by increased METs and functional capacity  ( )       Able to understand and use rate of perceived exertion (RPE) scale  Yes       Intervention  Provide education and explanation on how to use RPE scale       Expected Outcomes  Short Term: Able to use RPE daily in rehab to express subjective intensity level;Long Term:  Able to use RPE to guide intensity level when exercising independently       Knowledge and understanding of Target Heart Rate Range (THRR)  Yes       Intervention  Provide education and explanation of THRR including how the numbers were predicted and where they are located for reference       Expected Outcomes  Short Term: Able to state/look up THRR;Short Term: Able to use daily as guideline for intensity in rehab;Long Term: Able to use THRR to govern intensity when exercising independently       Able to check pulse independently  Yes       Intervention  Review the importance of being able to check your own pulse for safety during independent exercise;Provide education and demonstration on how to check pulse in carotid and radial arteries.       Expected Outcomes  Short Term: Able to explain why pulse checking is important during independent exercise;Long Term: Able to check pulse independently and accurately       Understanding of Exercise Prescription  Yes       Intervention  Provide education, explanation, and written materials on patient's individual exercise prescription       Expected Outcomes  Short Term: Able to explain program exercise prescription;Long Term: Able to explain home exercise prescription to exercise independently          Exercise Goals Re-Evaluation : Exercise Goals Re-Evaluation    Row Name 06/11/18  1602             Exercise Goal Re-Evaluation   Exercise Goals Review  Increase Physical Activity;Able to understand and use rate of perceived exertion (RPE) scale;Knowledge and understanding of Target Heart Rate Range (THRR);Understanding of Exercise Prescription;Increase Strength and Stamina;Able  to check pulse independently       Comments  Reviewed HEP. Reviewed THRR Range, RPE Scale, weather conditions, NTG use, endpoints of exercise, and warmup and cool down.        Expected Outcomes  Pt will continue to walk 3-4 days a week for 20-35 minutes. Pt will continue to increase cardiovasuclar strength. Will continue to monitor.           Discharge Exercise Prescription (Final Exercise Prescription Changes): Exercise Prescription Changes - 06/18/18 1610      Response to Exercise   Blood Pressure (Admit)  120/72    Blood Pressure (Exercise)  128/80    Blood Pressure (Exit)  102/66    Heart Rate (Admit)  64 bpm    Heart Rate (Exercise)  108 bpm    Heart Rate (Exit)  74 bpm    Rating of Perceived Exertion (Exercise)  12    Perceived Dyspnea (Exercise)  0    Symptoms  None    Comments  None    Duration  Continue with 30 min of aerobic exercise without signs/symptoms of physical distress.    Intensity  THRR unchanged      Progression   Progression  Continue to progress workloads to maintain intensity without signs/symptoms of physical distress.    Average METs  2.64      Resistance Training   Training Prescription  Yes    Weight  5lbs    Reps  10-15    Time  10 Minutes      NuStep   Level  4    SPM  95    Minutes  10    METs  3.3      Arm Ergometer   Level  2.5    Watts  40    Minutes  10    METs  2.07      Track   Laps  14    Minutes  10    METs  3.45      Home Exercise Plan   Plans to continue exercise at  Home (comment)    Frequency  Add 2 additional days to program exercise sessions.    Initial Home Exercises Provided  06/11/18       Nutrition:  Target Goals: Understanding of nutrition guidelines, daily intake of sodium 1500mg , cholesterol 200mg , calories 30% from fat and 7% or less from saturated fats, daily to have 5 or more servings of fruits and vegetables.  Biometrics: Pre Biometrics - 05/06/18 1045      Pre Biometrics   Height  5\' 11"  (1.803  m)    Weight  126 kg    Waist Circumference  49 inches    Hip Circumference  53.5 inches    Waist to Hip Ratio  0.92 %    BMI (Calculated)  38.76    Triceps Skinfold  24 mm    % Body Fat  37.6 %    Grip Strength  44 kg    Flexibility  9.5 in    Single Leg Stand  7.84 seconds        Nutrition Therapy Plan and Nutrition Goals: Nutrition Therapy & Goals - 05/06/18 1427  Nutrition Therapy   Diet  heart healthy      Personal Nutrition Goals   Nutrition Goal  Pt to identify and limit food sources of saturated fat, trans fat, refined carbohydrates and sodium    Personal Goal #2  Pt to identify food quantities necessary to achieve weight loss of 6-24 lbs. at graduation from cardiac rehab.    Personal Goal #3  Pt to identify and limit food sources of saturated fat, trans fat, refined carbohydrates and sodium      Intervention Plan   Intervention  Prescribe, educate and counsel regarding individualized specific dietary modifications aiming towards targeted core components such as weight, hypertension, lipid management, diabetes, heart failure and other comorbidities.    Expected Outcomes  Short Term Goal: Understand basic principles of dietary content, such as calories, fat, sodium, cholesterol and nutrients.;Long Term Goal: Adherence to prescribed nutrition plan.       Nutrition Assessments: Nutrition Assessments - 05/06/18 1428      MEDFICTS Scores   Pre Score  56       Nutrition Goals Re-Evaluation: Nutrition Goals Re-Evaluation    Row Name 05/06/18 1427             Goals   Current Weight  277 lb 12.5 oz (126 kg)          Nutrition Goals Re-Evaluation: Nutrition Goals Re-Evaluation    Row Name 05/06/18 1427             Goals   Current Weight  277 lb 12.5 oz (126 kg)          Nutrition Goals Discharge (Final Nutrition Goals Re-Evaluation): Nutrition Goals Re-Evaluation - 05/06/18 1427      Goals   Current Weight  277 lb 12.5 oz (126 kg)        Psychosocial: Target Goals: Acknowledge presence or absence of significant depression and/or stress, maximize coping skills, provide positive support system. Participant is able to verbalize types and ability to use techniques and skills needed for reducing stress and depression.  Initial Review & Psychosocial Screening: Initial Psych Review & Screening - 05/06/18 0942      Initial Review   Current issues with  None Identified      Family Dynamics   Good Support System?  Yes   Ronald CountsBob lists his wife, children, and close friends as sources of support.      Barriers   Psychosocial barriers to participate in program  There are no identifiable barriers or psychosocial needs.      Screening Interventions   Interventions  Encouraged to exercise;Provide feedback about the scores to participant    Expected Outcomes  Short Term goal: Identification and review with participant of any Quality of Life or Depression concerns found by scoring the questionnaire.;Long Term goal: The participant improves quality of Life and PHQ9 Scores as seen by post scores and/or verbalization of changes       Quality of Life Scores: Quality of Life - 05/06/18 0949      Quality of Life   Select  Quality of Life      Quality of Life Scores   Health/Function Pre  16.6 %    Socioeconomic Pre  19.5 %    Psych/Spiritual Pre  18.93 %    Family Pre  22.4 %    GLOBAL Pre  18.57 %      Scores of 19 and below usually indicate a poorer quality of life in these areas.  A difference  of  2-3 points is a clinically meaningful difference.  A difference of 2-3 points in the total score of the Quality of Life Index has been associated with significant improvement in overall quality of life, self-image, physical symptoms, and general health in studies assessing change in quality of life.  PHQ-9: Recent Review Flowsheet Data    Depression screen [facility] 2/9 05/10/2018 02/12/2015   Decreased Interest 0 0   Down, Depressed,  Hopeless 0 0   PHQ - 2 Score 0 0     Interpretation of Total Score  Total Score Depression Severity:  1-4 = Minimal depression, 5-9 = Mild depression, 10-14 = Moderate depression, 15-19 = Moderately severe depression, 20-27 = Severe depression   Psychosocial Evaluation and Intervention: Psychosocial Evaluation - 06/03/18 1044      Psychosocial Evaluation & Interventions   Interventions  Stress management education;Relaxation education;Encouraged to exercise with the program and follow exercise prescription    Comments  Ronald Hull has had his ablation.  He is hopeful to maintain NSR.     Expected Outcomes  Ronald Hull will maintain a positive outlook with good coping skills.     Continue Psychosocial Services   Follow up required by staff       Psychosocial Re-Evaluation: Psychosocial Re-Evaluation    Row Name 06/03/18 1047 07/01/18 1504           Psychosocial Re-Evaluation   Current issues with  None Identified  None Identified      Comments  No psychosocial interventions.  Ronald Hull is back in [employer].  Emotional support given.       Expected Outcomes  Ronald Hull will maintain a positive outlook.   Ronald Hull will maintain a positive outlook.       Interventions  Relaxation education;Stress management education;Encouraged to attend Cardiac Rehabilitation for the exercise  Relaxation education;Stress management education;Encouraged to attend Cardiac Rehabilitation for the exercise      Continue Psychosocial Services   No Follow up required  Follow up required by staff         Psychosocial Discharge (Final Psychosocial Re-Evaluation): Psychosocial Re-Evaluation - 07/01/18 1504      Psychosocial Re-Evaluation   Current issues with  None Identified    Comments  Ronald Hull is back in [employer].  Emotional support given.     Expected Outcomes  Ronald Hull will maintain a positive outlook.     Interventions  Relaxation education;Stress management education;Encouraged to attend Cardiac Rehabilitation for the exercise     Continue Psychosocial Services   Follow up required by staff       Vocational Rehabilitation: Provide vocational rehab assistance to qualifying candidates.   Vocational Rehab Evaluation & Intervention: Vocational Rehab - 05/06/18 0950      Initial Vocational Rehab Evaluation & Intervention   Assessment shows need for Vocational Rehabilitation  No       Education: Education Goals: Education classes will be provided on a weekly basis, covering required topics. Participant will state understanding/return demonstration of topics presented.  Learning Barriers/Preferences: Learning Barriers/Preferences - 05/06/18 1036      Learning Barriers/Preferences   Learning Barriers  Sight    Learning Preferences  Written Material;Verbal Instruction       Education Topics: Count Your Pulse:  -Group instruction provided by verbal instruction, demonstration, patient participation and written materials to support subject.  Instructors address importance of being able to find your pulse and how to count your pulse when at home without a heart monitor.  Patients get hands on experience  counting their pulse with staff help and individually.   CARDIAC REHAB PHASE II EXERCISE from 06/25/2018 in [facility] CARDIAC REHAB  Date  06/25/18  Educator  RN  Instruction Review Code  2- Demonstrated Understanding      Heart Attack, Angina, and Risk Factor Modification:  -Group instruction provided by verbal instruction, video, and written materials to support subject.  Instructors address signs and symptoms of angina and heart attacks.    Also discuss risk factors for heart disease and how to make changes to improve heart health risk factors.   Functional Fitness:  -Group instruction provided by verbal instruction, demonstration, patient participation, and written materials to support subject.  Instructors address safety measures for doing things around the house.  Discuss how to get up and  down off the floor, how to pick things up properly, how to safely get out of a chair without assistance, and balance training.   Meditation and Mindfulness:  -Group instruction provided by verbal instruction, patient participation, and written materials to support subject.  Instructor addresses importance of mindfulness and meditation practice to help reduce stress and improve awareness.  Instructor also leads participants through a meditation exercise.    Stretching for Flexibility and Mobility:  -Group instruction provided by verbal instruction, patient participation, and written materials to support subject.  Instructors lead participants through series of stretches that are designed to increase flexibility thus improving mobility.  These stretches are additional exercise for major muscle groups that are typically performed during regular warm up and cool down.   Hands Only CPR:  -Group verbal, video, and participation provides a basic overview of AHA guidelines for community CPR. Role-play of emergencies allow participants the opportunity to practice calling for help and chest compression technique with discussion of AED use.   Hypertension: -Group verbal and written instruction that provides a basic overview of hypertension including the most recent diagnostic guidelines, risk factor reduction with self-care instructions and medication management.   CARDIAC REHAB PHASE II EXERCISE from 06/25/2018 in [facility] CARDIAC REHAB  Date  06/04/18  Educator  RN  Instruction Review Code  2- Demonstrated Understanding       Nutrition I class: Heart Healthy Eating:  -Group instruction provided by PowerPoint slides, verbal discussion, and written materials to support subject matter. The instructor gives an explanation and review of the Therapeutic Lifestyle Changes diet recommendations, which includes a discussion on lipid goals, dietary fat, sodium, fiber, plant stanol/sterol  esters, sugar, and the components of a well-balanced, healthy diet.   Nutrition II class: Lifestyle Skills:  -Group instruction provided by PowerPoint slides, verbal discussion, and written materials to support subject matter. The instructor gives an explanation and review of label reading, grocery shopping for heart health, heart healthy recipe modifications, and ways to make healthier choices when eating out.   Diabetes Question & Answer:  -Group instruction provided by PowerPoint slides, verbal discussion, and written materials to support subject matter. The instructor gives an explanation and review of diabetes co-morbidities, pre- and post-prandial blood glucose goals, pre-exercise blood glucose goals, signs, symptoms, and treatment of hypoglycemia and hyperglycemia, and foot care basics.   Diabetes Blitz:  -Group instruction provided by PowerPoint slides, verbal discussion, and written materials to support subject matter. The instructor gives an explanation and review of the physiology behind type 1 and type 2 diabetes, diabetes medications and rational behind using different medications, pre- and post-prandial blood glucose recommendations and Hemoglobin A1c goals, diabetes diet, and exercise  including blood glucose guidelines for exercising safely.    Portion Distortion:  -Group instruction provided by PowerPoint slides, verbal discussion, written materials, and food models to support subject matter. The instructor gives an explanation of serving size versus portion size, changes in portions sizes over the last 20 years, and what consists of a serving from each food group.   Stress Management:  -Group instruction provided by verbal instruction, video, and written materials to support subject matter.  Instructors review role of stress in heart disease and how to cope with stress positively.     Exercising on Your Own:  -Group instruction provided by verbal instruction, power point, and  written materials to support subject.  Instructors discuss benefits of exercise, components of exercise, frequency and intensity of exercise, and end points for exercise.  Also discuss use of nitroglycerin and activating EMS.  Review options of places to exercise outside of rehab.  Review guidelines for sex with heart disease.   Cardiac Drugs I:  -Group instruction provided by verbal instruction and written materials to support subject.  Instructor reviews cardiac drug classes: antiplatelets, anticoagulants, beta blockers, and statins.  Instructor discusses reasons, side effects, and lifestyle considerations for each drug class.   Cardiac Drugs II:  -Group instruction provided by verbal instruction and written materials to support subject.  Instructor reviews cardiac drug classes: angiotensin converting enzyme inhibitors (ACE-I), angiotensin II receptor blockers (ARBs), nitrates, and calcium channel blockers.  Instructor discusses reasons, side effects, and lifestyle considerations for each drug class.   Anatomy and Physiology of the Circulatory System:  Group verbal and written instruction and models provide basic cardiac anatomy and physiology, with the coronary electrical and arterial systems. Review of: AMI, Angina, Valve disease, Heart Failure, Peripheral Artery Disease, Cardiac Arrhythmia, Pacemakers, and the ICD.   CARDIAC REHAB PHASE II EXERCISE from 06/25/2018 in [facility] CARDIAC REHAB  Date  06/02/18  Educator  RN  Instruction Review Code  2- Demonstrated Understanding      Other Education:  -Group or individual verbal, written, or video instructions that support the educational goals of the cardiac rehab program.   Holiday Eating Survival Tips:  -Group instruction provided by PowerPoint slides, verbal discussion, and written materials to support subject matter. The instructor gives patients tips, tricks, and techniques to help them not only survive but enjoy the  holidays despite the onslaught of food that accompanies the holidays.   Knowledge Questionnaire Score: Knowledge Questionnaire Score - 05/06/18 1036      Knowledge Questionnaire Score   Pre Score  19/24       Core Components/Risk Factors/Patient Goals at Admission: Personal Goals and Risk Factors at Admission - 05/06/18 1040      Core Components/Risk Factors/Patient Goals on Admission    Weight Management  Yes;Obesity    Intervention  Weight Management: Develop a combined nutrition and exercise program designed to reach desired caloric intake, while maintaining appropriate intake of nutrient and fiber, sodium and fats, and appropriate energy expenditure required for the weight goal.;Weight Management: Provide education and appropriate resources to help participant work on and attain dietary goals.;Obesity: Provide education and appropriate resources to help participant work on and attain dietary goals.;Weight Management/Obesity: Establish reasonable short term and long term weight goals.    Admit Weight  277 lb 12.5 oz (126 kg)    Goal Weight: Short Term  260 lb (117.9 kg)    Goal Weight: Long Term  230 lb (104.3 kg)    Expected Outcomes  Short Term: Continue to assess and modify interventions until short term weight is achieved;Long Term: Adherence to nutrition and physical activity/exercise program aimed toward attainment of established weight goal;Weight Loss: Understanding of general recommendations for a balanced deficit meal plan, which promotes 1-2 lb weight loss per week and includes a negative energy balance of [phone] kcal/d;Understanding recommendations for meals to include 15-35% energy as protein, 25-35% energy from fat, 35-60% energy from carbohydrates, less than 200mg  of dietary cholesterol, 20-35 gm of total fiber daily;Understanding of distribution of calorie intake throughout the day with the consumption of 4-5 meals/snacks    Hypertension  Yes    Intervention  Provide  education on lifestyle modifcations including regular physical activity/exercise, weight management, moderate sodium restriction and increased consumption of fresh fruit, vegetables, and low fat dairy, alcohol moderation, and smoking cessation.;Monitor prescription use compliance.    Expected Outcomes  Short Term: Continued assessment and intervention until BP is < 140/31mm HG in hypertensive participants. < 130/18mm HG in hypertensive participants with diabetes, heart failure or chronic kidney disease.;Long Term: Maintenance of blood pressure at goal levels.    Lipids  Yes    Intervention  Provide education and support for participant on nutrition & aerobic/resistive exercise along with prescribed medications to achieve LDL 70mg , HDL >40mg .    Expected Outcomes  Short Term: Participant states understanding of desired cholesterol values and is compliant with medications prescribed. Participant is following exercise prescription and nutrition guidelines.;Long Term: Cholesterol controlled with medications as prescribed, with individualized exercise RX and with personalized nutrition plan. Value goals: LDL < 70mg , HDL > 40 mg.       Core Components/Risk Factors/Patient Goals Review:  Goals and Risk Factor Review    Row Name 05/10/18 1542 06/03/18 1048 07/01/18 1504         Core Components/Risk Factors/Patient Goals Review   Personal Goals Review  Weight Management/Obesity;Lipids;Hypertension  Weight Management/Obesity;Lipids;Hypertension  Weight Management/Obesity;Lipids;Hypertension     Review  Pt willing to participate in CR exericise.  Ronald Hull would like to increase his exercise tolerance and stamina.  He would also like to continue to lose weight.   Pt willing to participate in CR exericise.  Ronald Hull was on hold for his ablation and subsequent recovery.  He has returned to exercise and tolerated it well, maintaining NSR.   Pt with multple CAD RFs willing to participate in CR exericise.  Pt has tolerated  exercise well.  Unfortunately, when he was on vacation Ronald Hull developed Atrial Fib.  Will continue to monitor pt.      Expected Outcomes  Pt will continue to participate in CR exercise, nutrition, and lifestyle modification opportunities.   Pt will continue to participate in CR exercise, nutrition, and lifestyle modification opportunities.   Pt will continue to participate in CR exercise, nutrition, and lifestyle modification opportunities.         Core Components/Risk Factors/Patient Goals at Discharge (Final Review):  Goals and Risk Factor Review - 07/01/18 1504      Core Components/Risk Factors/Patient Goals Review   Personal Goals Review  Weight Management/Obesity;Lipids;Hypertension    Review  Pt with multple CAD RFs willing to participate in CR exericise.  Pt has tolerated exercise well.  Unfortunately, when he was on vacation Ronald Hull developed Atrial Fib.  Will continue to monitor pt.     Expected Outcomes  Pt will continue to participate in CR exercise, nutrition, and lifestyle modification opportunities.        ITP Comments: ITP Comments    Row  Name 05/06/18 0940 05/06/18 1032 05/10/18 1537 06/03/18 1051 07/01/18 1502   ITP Comments  Dr. Armanda Magic, Medical Director  Dr. Armanda Magic, Medical Director  Pt started exercise today and tolerated it fairly well.  HR elevated at times above THR.   30 Day ITP Review. Ronald Hull was on hold for his ablation and subsequent recovery.  He has returned to exercise and tolerated it well, maintaining NSR.  30 Day ITP Review.  Pt has tolerated exercise well.  Unfortunately, when he was on vacation Ronald Hull developed Atrial Fib.  Will continue to monitor pt.       Comments: See ITP Comments.

## 2018-07-02 ENCOUNTER — Encounter (HOSPITAL_COMMUNITY): Payer: Self-pay | Admitting: Cardiology

## 2018-07-02 ENCOUNTER — Ambulatory Visit (HOSPITAL_COMMUNITY): Payer: Medicare Other

## 2018-07-02 ENCOUNTER — Encounter (HOSPITAL_COMMUNITY)
Admission: RE | Admit: 2018-07-02 | Discharge: 2018-07-02 | Disposition: A | Discharge status: Home / Self Care | Payer: Medicare Other | Source: Ambulatory Visit | Attending: Cardiovascular Disease | Admitting: Cardiovascular Disease

## 2018-07-02 DIAGNOSIS — Z955 Presence of coronary angioplasty implant and graft: Secondary | ICD-10-CM

## 2018-07-05 ENCOUNTER — Ambulatory Visit (HOSPITAL_COMMUNITY): Payer: Medicare Other

## 2018-07-05 ENCOUNTER — Telehealth (HOSPITAL_COMMUNITY): Payer: Self-pay | Admitting: Internal Medicine

## 2018-07-05 ENCOUNTER — Encounter (HOSPITAL_COMMUNITY): Payer: Medicare Other

## 2018-07-05 DIAGNOSIS — Z006 Encounter for examination for normal comparison and control in clinical research program: Secondary | ICD-10-CM

## 2018-07-05 DIAGNOSIS — E7800 Pure hypercholesterolemia, unspecified: Secondary | ICD-10-CM | POA: Diagnosis not present

## 2018-07-05 DIAGNOSIS — Z1389 Encounter for screening for other disorder: Secondary | ICD-10-CM | POA: Diagnosis not present

## 2018-07-05 DIAGNOSIS — Z0000 Encounter for general adult medical examination without abnormal findings: Secondary | ICD-10-CM | POA: Diagnosis not present

## 2018-07-05 DIAGNOSIS — I4891 Unspecified atrial fibrillation: Secondary | ICD-10-CM | POA: Diagnosis not present

## 2018-07-05 NOTE — Research (Signed)
Xience 28 F/U:  Received notification that patient was scheduled for a cardioversion which was successfully done on 06-30-2018. Documented in [facility] and not needed to submit to IRB due to not being related to the device or needing a protocol change.

## 2018-07-07 ENCOUNTER — Other Ambulatory Visit: Payer: Self-pay

## 2018-07-07 ENCOUNTER — Ambulatory Visit (HOSPITAL_COMMUNITY): Payer: Medicare Other

## 2018-07-07 ENCOUNTER — Encounter (HOSPITAL_COMMUNITY)
Admission: RE | Admit: 2018-07-07 | Discharge: 2018-07-07 | Disposition: A | Discharge status: Home / Self Care | Payer: Medicare Other | Source: Ambulatory Visit | Attending: Cardiovascular Disease | Admitting: Cardiovascular Disease

## 2018-07-07 DIAGNOSIS — Z955 Presence of coronary angioplasty implant and graft: Secondary | ICD-10-CM

## 2018-07-09 ENCOUNTER — Other Ambulatory Visit: Payer: Self-pay

## 2018-07-09 ENCOUNTER — Ambulatory Visit (HOSPITAL_COMMUNITY)
Admission: RE | Admit: 2018-07-09 | Discharge: 2018-07-09 | Disposition: A | Discharge status: Home / Self Care | Payer: Medicare Other | Source: Ambulatory Visit | Attending: Physician Assistant | Admitting: Physician Assistant

## 2018-07-09 ENCOUNTER — Ambulatory Visit (HOSPITAL_COMMUNITY): Payer: Medicare Other

## 2018-07-09 ENCOUNTER — Encounter (HOSPITAL_COMMUNITY): Payer: Self-pay | Admitting: Physician Assistant

## 2018-07-09 ENCOUNTER — Encounter (HOSPITAL_COMMUNITY)
Admission: RE | Admit: 2018-07-09 | Discharge: 2018-07-09 | Disposition: A | Discharge status: Home / Self Care | Payer: Medicare Other | Source: Ambulatory Visit | Attending: Cardiovascular Disease | Admitting: Cardiovascular Disease

## 2018-07-09 VITALS — BP 116/68 | HR 50 | Ht 71.0 in | Wt 266.0 lb

## 2018-07-09 DIAGNOSIS — Z8249 Family history of ischemic heart disease and other diseases of the circulatory system: Secondary | ICD-10-CM | POA: Diagnosis not present

## 2018-07-09 DIAGNOSIS — Z6837 Body mass index (BMI) 37.0-37.9, adult: Secondary | ICD-10-CM | POA: Diagnosis not present

## 2018-07-09 DIAGNOSIS — I4819 Other persistent atrial fibrillation: Secondary | ICD-10-CM | POA: Diagnosis not present

## 2018-07-09 DIAGNOSIS — E7800 Pure hypercholesterolemia, unspecified: Secondary | ICD-10-CM | POA: Insufficient documentation

## 2018-07-09 DIAGNOSIS — I2510 Atherosclerotic heart disease of native coronary artery without angina pectoris: Secondary | ICD-10-CM | POA: Diagnosis not present

## 2018-07-09 DIAGNOSIS — Z87891 Personal history of nicotine dependence: Secondary | ICD-10-CM | POA: Insufficient documentation

## 2018-07-09 DIAGNOSIS — E669 Obesity, unspecified: Secondary | ICD-10-CM | POA: Insufficient documentation

## 2018-07-09 DIAGNOSIS — G4733 Obstructive sleep apnea (adult) (pediatric): Secondary | ICD-10-CM | POA: Diagnosis not present

## 2018-07-09 DIAGNOSIS — Z79899 Other long term (current) drug therapy: Secondary | ICD-10-CM | POA: Insufficient documentation

## 2018-07-09 DIAGNOSIS — Z7901 Long term (current) use of anticoagulants: Secondary | ICD-10-CM | POA: Diagnosis not present

## 2018-07-09 DIAGNOSIS — Z955 Presence of coronary angioplasty implant and graft: Secondary | ICD-10-CM

## 2018-07-09 DIAGNOSIS — Z7982 Long term (current) use of aspirin: Secondary | ICD-10-CM | POA: Insufficient documentation

## 2018-07-09 DIAGNOSIS — Z888 Allergy status to other drugs, medicaments and biological substances status: Secondary | ICD-10-CM | POA: Diagnosis not present

## 2018-07-09 DIAGNOSIS — I10 Essential (primary) hypertension: Secondary | ICD-10-CM | POA: Insufficient documentation

## 2018-07-09 NOTE — Progress Notes (Signed)
Primary Care Physician: Renford Dills, MD Referring Physician: Dr. Elberta Fortis Cardiologist: Dr. Madolyn Frieze Ronald Hull is a 73 y.o. male with a h/o persistent afib, CAD, that is in the afib clinic for f/u. Patient reports that he felt well immediately after his afib ablation with much more energy and increased exercise tolerance at cardiac rehab. However, about one week ago while traveling in [place], he became more fatigued and noted on his watch that his HR was elevated to about 150 bpm. He went to the local ER and was rate controlled and offered DCCV which he declined stating he preferred to follow up with his usual doctors.   He is now s/p DCCV on 06/30/18. He reports that he feels well with no fatigue symptoms. He is in SR today. He denies chest pain, swallowing or groin issues. He reports no missed doses of anticoagulation.  Today, he denies symptoms of palpitations, chest pain, orthopnea, PND, lower extremity edema, dizziness, presyncope, syncope, or neurologic sequela. The patient is tolerating medications without difficulties and is otherwise without complaint today.   Past Medical History:  Diagnosis Date  . Arthritis   . Bronchitis 05/23/2018  . CAD (coronary artery disease) 02/24/2018   LHC 10/19: pLAD 50, mLAD 50 (dFFR=0.89), oLCx 95, pRCA 30, mRCA 80 >> PCI: DES to LCx; DES to RCA // Xience28 Trial participant (will DC Plavix after 30 d and remain on ASA + Apixaban)  . Chest pain secondary to ablation, with expected mild elevation in troponin 05/22/2018  . Complication of anesthesia    BP dropped post colonoscopy  . Hypercholesteremia   . Hypertension   . Persistent atrial fibrillation   . Sleep apnea    does use a cpap  . Wears glasses    Past Surgical History:  Procedure Laterality Date  . ATRIAL FIBRILLATION ABLATION N/A 05/20/2018   Procedure: ATRIAL FIBRILLATION ABLATION;  Surgeon: Regan Lemming, MD;  Location: [facility] INVASIVE CV LAB;  Service: Cardiovascular;   Laterality: N/A;  . CARDIOVERSION N/A 11/01/2015   Procedure: CARDIOVERSION;  Surgeon: [name], MD;  Location: [facility] ENDOSCOPY;  Service: Cardiovascular;  Laterality: N/A;  . CARDIOVERSION N/A 11/23/2015   Procedure: CARDIOVERSION;  Surgeon: Will [name], MD;  Location: [facility] ENDOSCOPY;  Service: Cardiovascular;  Laterality: N/A;  . CARDIOVERSION N/A 10/12/2017   Procedure: CARDIOVERSION;  Surgeon: [name], MD;  Location: [facility] ENDOSCOPY;  Service: Cardiovascular;  Laterality: N/A;  . CARDIOVERSION N/A 06/30/2018   Procedure: CARDIOVERSION;  Surgeon: Jake Bathe, MD;  Location: [facility] ENDOSCOPY;  Service: Cardiovascular;  Laterality: N/A;  . COLONOSCOPY    . CORONARY STENT INTERVENTION N/A 02/24/2018   Procedure: CORONARY STENT INTERVENTION;  Surgeon: Tonny Bollman, MD;  Location: [facility] INVASIVE CV LAB;  Service: Cardiovascular;  Laterality: N/A;  . INTRAVASCULAR PRESSURE WIRE/FFR STUDY N/A 02/24/2018   Procedure: INTRAVASCULAR PRESSURE WIRE/FFR STUDY;  Surgeon: Tonny Bollman, MD;  Location: [facility] INVASIVE CV LAB;  Service: Cardiovascular;  Laterality: N/A;  . KNEE ARTHROSCOPY  2000  . LEFT HEART CATH AND CORONARY ANGIOGRAPHY N/A 02/24/2018   Procedure: LEFT HEART CATH AND CORONARY ANGIOGRAPHY;  Surgeon: Tonny Bollman, MD;  Location: [facility] INVASIVE CV LAB;  Service: Cardiovascular;  Laterality: N/A;  . SHOULDER ARTHROSCOPY WITH ROTATOR CUFF REPAIR AND SUBACROMIAL DECOMPRESSION Right 07/15/2012   Procedure: RIGHT ARTHROSCOPY SHOULDER DECOMPRESSION  SUBACROMIAL PARTIAL ACROMIOPLASTY WITH CORACOACROMIAL RELEASE,  DISTAL CLAVICULECTOMY, resect biceps debride labrium WITH ROTATOR CUFF REPAIR;  Surgeon: [name], MD;  Location:  [facility];  Service: Orthopedics;  Laterality: Right;    Current Outpatient Medications  Medication Sig Dispense Refill  . acetaminophen (TYLENOL) 325 MG tablet Take 325 mg by mouth every 6 (six) hours as needed (for pain.).     Marland Kitchen ASPIRIN LOW DOSE  81 MG chewable tablet Chew 81 mg by mouth every evening.   0  . atorvastatin (LIPITOR) 40 MG tablet Take 1 tablet (40 mg total) by mouth daily. (Patient taking differently: Take 40 mg by mouth at bedtime. ) 90 tablet 3  . ELIQUIS 5 MG TABS tablet TAKE 1 TABLET(5 MG) BY MOUTH TWICE DAILY (Patient taking differently: Take 5 mg by mouth 2 (two) times daily. ) 60 tablet 5  . metoprolol tartrate (LOPRESSOR) 50 MG tablet Take 1 tablet (50 mg total) by mouth 2 (two) times daily. 180 tablet 3  . zaleplon (SONATA) 5 MG capsule Take 5 mg by mouth at bedtime as needed for sleep.    . nitroGLYCERIN (NITROSTAT) 0.4 MG SL tablet Place 1 tablet (0.4 mg total) under the tongue every 5 (five) minutes as needed. (Patient not taking: Reported on 07/09/2018) 25 tablet 2   No current facility-administered medications for this encounter.     Allergies  Allergen Reactions  . Lisinopril Cough    Social History   Socioeconomic History  . Marital status: Married    Spouse name: Not on file  . Number of children: Not on file  . Years of education: Not on file  . Highest education level: Not on file  Occupational History  . Not on file  Social Needs  . Financial resource strain: Not very hard  . Food insecurity:    Worry: Never true    Inability: Never true  . Transportation needs:    Medical: No    Non-medical: No  Tobacco Use  . Smoking status: Former Smoker    Last attempt to quit: 07/09/1973    Years since quitting: 45.0  . Smokeless tobacco: Never Used  Substance and Sexual Activity  . Alcohol use: Yes    Alcohol/week: 2.0 standard drinks    Types: 2 Glasses of wine per week  . Drug use: No  . Sexual activity: Not Currently    Birth control/protection: Abstinence  Lifestyle  . Physical activity:    Days per week: Not on file    Minutes per session: Not on file  . Stress: Not on file  Relationships  . Social connections:    Talks on phone: Not on file    Gets together: Not on file     Attends religious service: Not on file    Active member of club or organization: Not on file    Attends meetings of clubs or organizations: Not on file    Relationship status: Not on file  . Intimate partner violence:    Fear of current or ex partner: No    Emotionally abused: No    Physically abused: No    Forced sexual activity: No  Other Topics Concern  . Not on file  Social History Narrative  . Not on file    Family History  Problem Relation Age of Onset  . Heart failure Father   . Other Mother        bad fall  . Clotting disorder Mother   . Cancer Sister   . Hypertension Sister   . Heart disease Brother     ROS- All systems are reviewed and negative except  as per the HPI above  Physical Exam: Vitals:   07/09/18 1018  BP: 116/68  Pulse: (!) 50  Weight: 120.7 kg  Height: 5\' 11"  (1.803 m)   Wt Readings from Last 3 Encounters:  07/09/18 120.7 kg  06/30/18 118.8 kg  06/24/18 123.4 kg    Labs: Lab Results  Component Value Date   NA 136 06/24/2018   K 4.3 06/24/2018   CL 106 06/24/2018   CO2 26 06/24/2018   GLUCOSE 101 (H) 06/24/2018   BUN 11 06/24/2018   CREATININE 0.98 06/24/2018   CALCIUM 8.8 (L) 06/24/2018   No results found for: INR Lab Results  Component Value Date   CHOL 96 (L) 04/09/2018   HDL 31 (L) 04/09/2018   LDLCALC 50 04/09/2018   TRIG 77 04/09/2018    GEN- The patient is well appearing obese male, alert and oriented x 3 today.   HEENT-head normocephalic, atraumatic, sclera clear, conjunctiva pink, hearing intact, trachea midline. Lungs- Clear to ausculation bilaterally, normal work of breathing Heart- Regular rate and rhythm, no murmurs, rubs or gallops  GI- soft, NT, ND, + BS Extremities- no clubbing, cyanosis, or edema MS- no significant deformity or atrophy Skin- no rash or lesion Psych- euthymic mood, full affect Neuro- strength and sensation are intact   EKG-  Sinus bradycardia HR 50, PR 178, QRS 100, QTc 386  Echo 05/22/18  demonstrates - Procedure narrative: Transthoracic echocardiography. Image   quality was poor. The study was technically difficult, as a   result of poor acoustic windows. Intravenous contrast (Definity)   was administered. - Left ventricle: The cavity size was normal. Wall thickness was increased in a pattern of mild LVH. Systolic function was normal.   The estimated ejection fraction was in the range of 60% to 65%.   Wall motion was normal; there were no regional wall motion   abnormalities. Doppler parameters are consistent with restrictive   physiology, indicative of decreased left ventricular diastolic   compliance and/or increased left atrial pressure. Doppler   parameters are consistent with high ventricular filling pressure. - Aortic valve: Mildly calcified annulus. Trileaflet. There was   mild regurgitation. - Mitral valve: Moderately calcified annulus. Normal thickness   leaflets . There was mild regurgitation. - Tricuspid valve: There was mild regurgitation. - Systemic veins: IVC poorly visualized but appears dilated.  Assessment and Plan: 1. Persistent atrial fibrillation S/p ablation 05/20/18 with Dr Elberta Fortis. Now s/p DCCV, appears to be maintaining SR.  Continue metoprolol and Eliquis. Patient reports no missed doses of anticoagulation.  This patients CHA2DS2-VASc Score and unadjusted Ischemic Stroke Rate (% per year) is equal to 3.2 % stroke rate/year from a score of 3  Above score calculated as 1 point each if present [CHF, HTN, DM, Vascular=MI/PAD/Aortic Plaque, Age if 65-74, or Male] Above score calculated as 2 points each if present [Age > 75, or Stroke/TIA/TE]  2. CAD S/p DES to RCA and LCx. Currently no anginal symptoms. Continue present therapy and risk factor modification.  3. HTN  Stable, no changes today.  4. OSA Compliant with CPAP therapy.  5. Obesity Body mass index is 37.1 kg/m. Encouraged lifestyle modifications including diet and exercise.  Continue cardiac rehab.  Follow up with Dr Elberta Fortis as scheduled.   [name] PA-C Afib [facility] [address] [place], [place] [address] [phone]

## 2018-07-12 ENCOUNTER — Telehealth (HOSPITAL_COMMUNITY): Payer: Self-pay | Admitting: Internal Medicine

## 2018-07-12 ENCOUNTER — Ambulatory Visit (HOSPITAL_COMMUNITY): Payer: Medicare Other

## 2018-07-12 ENCOUNTER — Encounter (HOSPITAL_COMMUNITY): Payer: Medicare Other

## 2018-07-14 ENCOUNTER — Encounter (HOSPITAL_COMMUNITY): Payer: Medicare Other

## 2018-07-14 ENCOUNTER — Ambulatory Visit (HOSPITAL_COMMUNITY): Payer: Medicare Other

## 2018-07-16 ENCOUNTER — Ambulatory Visit: Payer: Medicare Other | Admitting: Cardiology

## 2018-07-16 ENCOUNTER — Ambulatory Visit (HOSPITAL_COMMUNITY): Payer: Medicare Other

## 2018-07-16 ENCOUNTER — Encounter (HOSPITAL_COMMUNITY): Payer: Medicare Other

## 2018-07-19 ENCOUNTER — Encounter (HOSPITAL_COMMUNITY): Payer: Medicare Other

## 2018-07-19 ENCOUNTER — Ambulatory Visit (HOSPITAL_COMMUNITY): Payer: Medicare Other

## 2018-07-21 ENCOUNTER — Ambulatory Visit (HOSPITAL_COMMUNITY): Payer: Medicare Other

## 2018-07-21 ENCOUNTER — Encounter (HOSPITAL_COMMUNITY): Payer: Medicare Other

## 2018-07-22 ENCOUNTER — Telehealth (HOSPITAL_COMMUNITY): Payer: Self-pay

## 2018-07-22 NOTE — Telephone Encounter (Signed)
Pt called regarding extended closure of Cardiac Rehab for 4 weeks due to COVID-19, tentative reopen date of 08/16/2018.   Left message with wife. Wife states pt is doing fine and is practicing social distancing.   York Cerise MS, ACSM CEP 10:46 AM 07/22/2018

## 2018-07-22 NOTE — Progress Notes (Signed)
Cardiac Individual Treatment Plan  Patient Details  Name: Ronald Hull MRN: [id number] Date of Birth: January 26, 1946 Referring Provider:     CARDIAC REHAB PHASE II ORIENTATION from 05/06/2018 in [facility] CARDIAC [facility]  Referring Provider  Tonny Bollman MD       Initial Encounter Date:    CARDIAC REHAB PHASE II ORIENTATION from 05/06/2018 in [facility] CARDIAC REHAB  Date  05/06/18      Visit Diagnosis: S/P drug eluting coronary stent placement  Patient's Home Medications on Admission:  Current Outpatient Medications:  .  acetaminophen (TYLENOL) 325 MG tablet, Take 325 mg by mouth every 6 (six) hours as needed (for pain.). , Disp: , Rfl:  .  ASPIRIN LOW DOSE 81 MG chewable tablet, Chew 81 mg by mouth every evening. , Disp: , Rfl: 0 .  atorvastatin (LIPITOR) 40 MG tablet, Take 1 tablet (40 mg total) by mouth daily. (Patient taking differently: Take 40 mg by mouth at bedtime. ), Disp: 90 tablet, Rfl: 3 .  ELIQUIS 5 MG TABS tablet, TAKE 1 TABLET(5 MG) BY MOUTH TWICE DAILY (Patient taking differently: Take 5 mg by mouth 2 (two) times daily. ), Disp: 60 tablet, Rfl: 5 .  metoprolol tartrate (LOPRESSOR) 50 MG tablet, Take 1 tablet (50 mg total) by mouth 2 (two) times daily., Disp: 180 tablet, Rfl: 3 .  nitroGLYCERIN (NITROSTAT) 0.4 MG SL tablet, Place 1 tablet (0.4 mg total) under the tongue every 5 (five) minutes as needed. (Patient not taking: Reported on 07/09/2018), Disp: 25 tablet, Rfl: 2 .  zaleplon (SONATA) 5 MG capsule, Take 5 mg by mouth at bedtime as needed for sleep., Disp: , Rfl:   Past Medical History: Past Medical History:  Diagnosis Date  . Arthritis   . Bronchitis 05/23/2018  . CAD (coronary artery disease) 02/24/2018   LHC 10/19: pLAD 50, mLAD 50 (dFFR=0.89), oLCx 95, pRCA 30, mRCA 80 >> PCI: DES to LCx; DES to RCA // [id number] Trial participant (will DC Plavix after 30 d and remain on ASA + Apixaban)  . Chest pain secondary to ablation,  with expected mild elevation in troponin 05/22/2018  . Complication of anesthesia    BP dropped post colonoscopy  . Hypercholesteremia   . Hypertension   . Persistent atrial fibrillation   . Sleep apnea    does use a cpap  . Wears glasses     Tobacco Use: Social History   Tobacco Use  Smoking Status Former Smoker  . Last attempt to quit: 07/09/1973  . Years since quitting: 45.0  Smokeless Tobacco Never Used    Labs: Recent Review Advice worker    Labs for ITP Cardiac and Pulmonary Rehab Latest Ref Rng & Units 11/23/2015 04/09/2018   Cholestrol 100 - 199 mg/dL - 33(A)   LDLCALC 0 - 99 mg/dL - 50   HDL >07 mg/dL - 62(U)   Trlycerides 0 - 149 mg/dL - 77   TCO2 0 - 633 mmol/L 26 -      Capillary Blood Glucose: No results found for: GLUCAP   Exercise Target Goals: Exercise Program Goal: Individual exercise prescription set using results from initial 6 min walk test and THRR while considering  patient's activity barriers and safety.   Exercise Prescription Goal: Initial exercise prescription builds to 30-45 minutes a day of aerobic activity, 2-3 days per week.  Home exercise guidelines will be given to patient during program as part of exercise prescription that the participant will acknowledge.  Activity Barriers & Risk Stratification: Activity Barriers & Cardiac Risk Stratification - 05/06/18 1047      Activity Barriers & Cardiac Risk Stratification   Activity Barriers  Deconditioning;Balance Concerns;Other (comment)    Comments  Occasional Knee Pain    Cardiac Risk Stratification  Moderate       6 Minute Walk: 6 Minute Walk    Row Name 05/06/18 1044 05/06/18 1121       6 Minute Walk   Phase  Initial  -    Distance  1478 feet  -    Walk Time  6 minutes  -    # of Rest Breaks  0  -    MPH  2.79  -    METS  2.76  -    RPE  11  -    Perceived Dyspnea   0  -    VO2 Peak  9.65  -    Symptoms  No  Yes (comment)    Comments  -  Mild SOB +1    Resting HR  73  bpm  -    Resting BP  120/70  -    Resting Oxygen Saturation   95 %  -    Exercise Oxygen Saturation  during 6 min walk  99 %  -    Max Ex. HR  134 bpm  -    Max Ex. BP  130/74  -    2 Minute Post BP  110/76  -       Oxygen Initial Assessment:   Oxygen Re-Evaluation:   Oxygen Discharge (Final Oxygen Re-Evaluation):   Initial Exercise Prescription: Initial Exercise Prescription - 05/06/18 1000      Date of Initial Exercise RX and Referring Provider   Date  05/06/18    Referring Provider  Tonny Bollman MD     Expected Discharge Date  08/13/18      NuStep   Level  2    SPM  75    Minutes  10    METs  2.3      Arm Ergometer   Level  2.5    Watts  40    Minutes  10    METs  2.68      Track   Laps  11    Minutes  10    METs  2.91      Prescription Details   Frequency (times per week)  3x    Duration  Progress to 30 minutes of continuous aerobic without signs/symptoms of physical distress      Intensity   THRR 40-80% of Max Heartrate  5-118    Ratings of Perceived Exertion  11-13    Perceived Dyspnea  0-4      Progression   Progression  Continue progressive overload as per policy without signs/symptoms or physical distress.      Resistance Training   Training Prescription  Yes    Weight  3lbs    Reps  10-15       Perform Capillary Blood Glucose checks as needed.  Exercise Prescription Changes: Exercise Prescription Changes    Row Name 05/10/18 1031 05/14/18 1030 05/31/18 1427 06/18/18 1610 06/28/18 1305     Response to Exercise   Blood Pressure (Admit)  120/70  116/70  118/62  120/72  128/66   Blood Pressure (Exercise)  138/82  120/72  120/80  128/80  124/68   Blood Pressure (Exit)  116/60  120/80  120/80  102/66  120/80   Heart Rate (Admit)  97 bpm  84 bpm  57 bpm  64 bpm  91 bpm   Heart Rate (Exercise)  145 bpm  136 bpm  104 bpm  108 bpm  138 bpm   Heart Rate (Exit)  95 bpm  92 bpm  57 bpm  74 bpm  87 bpm   Rating of Perceived Exertion  (Exercise)  11  11  12  12  11    Perceived Dyspnea (Exercise)  0  0  0  0  0   Symptoms  Elevated HR   None  None  None  None   Comments  Pt oriented to exercise equipment  None  Pt's first day back since on medical hold   None  None   Duration  Progress to 30 minutes of  aerobic without signs/symptoms of physical distress  Progress to 30 minutes of  aerobic without signs/symptoms of physical distress  Progress to 30 minutes of  aerobic without signs/symptoms of physical distress  Continue with 30 min of aerobic exercise without signs/symptoms of physical distress.  Continue with 30 min of aerobic exercise without signs/symptoms of physical distress.   Intensity  THRR unchanged  THRR unchanged  THRR unchanged  THRR unchanged  THRR unchanged     Progression   Progression  Continue to progress workloads to maintain intensity without signs/symptoms of physical distress.  Continue to progress workloads to maintain intensity without signs/symptoms of physical distress.  Continue to progress workloads to maintain intensity without signs/symptoms of physical distress.  Continue to progress workloads to maintain intensity without signs/symptoms of physical distress.  Continue to progress workloads to maintain intensity without signs/symptoms of physical distress.   Average METs  2.23  2.52  2.69  2.64  3.73     Resistance Training   Training Prescription  Yes  Yes  No  Yes  Yes   Weight  3lbs  3lbs  3lbs  5lbs  5lbs   Reps  10-15  10-15  10-15  10-15  10-15   Time  10 Minutes  10 Minutes  10 Minutes  10 Minutes  10 Minutes     NuStep   Level  2  2  2  4  4    SPM  75  75  80  95  95   Minutes  10  10  10  10  10    METs  2.1  2.2  2.8  3.3  4     Arm Ergometer   Level  2.5  2.5  2.5  2.5  2.5   Watts  40  40  40  40  40   Minutes  10  10  10  10  10    METs  2.04  2.05  2.05  2.07  2.07     Track   Laps  9  13  13  14  14    Minutes  10  10  10  10  10    METs  2.53  3.3  3.3  3.45  3.45      Home Exercise Plan   Plans to continue exercise at  -  -  -  Home (comment)  Home (comment) Walking   Frequency  -  -  -  Add 2 additional days to program exercise sessions.  Add 2 additional days to program exercise sessions.   Initial Home Exercises Provided  -  -  -  06/11/18  06/11/18   Row Name 07/09/18 1310             Response to Exercise   Blood Pressure (Admit)  126/78       Blood Pressure (Exercise)  128/80       Blood Pressure (Exit)  124/72       Heart Rate (Admit)  55 bpm       Heart Rate (Exercise)  107 bpm       Heart Rate (Exit)  68 bpm       Rating of Perceived Exertion (Exercise)  13       Perceived Dyspnea (Exercise)  0       Symptoms  None       Comments  None       Duration  Continue with 30 min of aerobic exercise without signs/symptoms of physical distress.       Intensity  THRR unchanged         Progression   Progression  Continue to progress workloads to maintain intensity without signs/symptoms of physical distress.       Average METs  3.9         Resistance Training   Training Prescription  Yes       Weight  5lbs       Reps  10-15       Time  10 Minutes         NuStep   Level  4       SPM  95       Minutes  10       METs  4.7         Arm Ergometer   Level  2.5       Watts  40       Minutes  10       METs  3.5         Track   Laps  14       Minutes  10       METs  3.45         Home Exercise Plan   Plans to continue exercise at  Home (comment) Walking       Frequency  Add 2 additional days to program exercise sessions.       Initial Home Exercises Provided  06/11/18          Exercise Comments: Exercise Comments    Row Name 05/10/18 1033 06/03/18 1429 06/11/18 1602 07/16/18 1311     Exercise Comments  Pt's first day of exercise. Pt oriented to exercise equipment. Pt responded well to workloads. Will continue to monitor and progress as tolerated.   Pt has returned to exercise from ablation procedure. Pt is responding well to  exercise workloads. Will continue to monitor and progress pt as tolerated. Will follow up with pt regarding home exercise plan.   Reviewed HEP with pt. Will continue to monitor and progress pt as tolerated.   Pt has been tolerating exercise well. Exercise is currently on hold due to department closure per CDC recommendation to hlep prevent COVID-19.        Exercise Goals and Review: Exercise Goals    Row Name 05/06/18 1041             Exercise Goals   Increase Physical Activity  Yes       Intervention  Provide advice, education, support and counseling about physical activity/exercise needs.;Develop an individualized exercise prescription for  aerobic and resistive training based on initial evaluation findings, risk stratification, comorbidities and participant's personal goals.       Expected Outcomes  Short Term: Attend rehab on a regular basis to increase amount of physical activity.;Long Term: Add in home exercise to make exercise part of routine and to increase amount of physical activity.;Long Term: Exercising regularly at least 3-5 days a week.       Increase Strength and Stamina  Yes       Intervention  Provide advice, education, support and counseling about physical activity/exercise needs.;Develop an individualized exercise prescription for aerobic and resistive training based on initial evaluation findings, risk stratification, comorbidities and participant's personal goals.       Expected Outcomes  Short Term: Increase workloads from initial exercise prescription for resistance, speed, and METs.;Short Term: Perform resistance training exercises routinely during rehab and add in resistance training at home;Long Term: Improve cardiorespiratory fitness, muscular endurance and strength as measured by increased METs and functional capacity ( )       Able to understand and use rate of perceived exertion (RPE) scale  Yes       Intervention  Provide education and explanation on how to use  RPE scale       Expected Outcomes  Short Term: Able to use RPE daily in rehab to express subjective intensity level;Long Term:  Able to use RPE to guide intensity level when exercising independently       Knowledge and understanding of Target Heart Rate Range (THRR)  Yes       Intervention  Provide education and explanation of THRR including how the numbers were predicted and where they are located for reference       Expected Outcomes  Short Term: Able to state/look up THRR;Short Term: Able to use daily as guideline for intensity in rehab;Long Term: Able to use THRR to govern intensity when exercising independently       Able to check pulse independently  Yes       Intervention  Review the importance of being able to check your own pulse for safety during independent exercise;Provide education and demonstration on how to check pulse in carotid and radial arteries.       Expected Outcomes  Short Term: Able to explain why pulse checking is important during independent exercise;Long Term: Able to check pulse independently and accurately       Understanding of Exercise Prescription  Yes       Intervention  Provide education, explanation, and written materials on patient's individual exercise prescription       Expected Outcomes  Short Term: Able to explain program exercise prescription;Long Term: Able to explain home exercise prescription to exercise independently          Exercise Goals Re-Evaluation : Exercise Goals Re-Evaluation    Row Name 06/11/18 1602             Exercise Goal Re-Evaluation   Exercise Goals Review  Increase Physical Activity;Able to understand and use rate of perceived exertion (RPE) scale;Knowledge and understanding of Target Heart Rate Range (THRR);Understanding of Exercise Prescription;Increase Strength and Stamina;Able to check pulse independently       Comments  Reviewed HEP. Reviewed THRR Range, RPE Scale, weather conditions, NTG use, endpoints of exercise, and  warmup and cool down.        Expected Outcomes  Pt will continue to walk 3-4 days a week for 20-35 minutes. Pt will continue to increase cardiovasuclar strength. Will continue to  monitor.           Discharge Exercise Prescription (Final Exercise Prescription Changes): Exercise Prescription Changes - 07/09/18 1310      Response to Exercise   Blood Pressure (Admit)  126/78    Blood Pressure (Exercise)  128/80    Blood Pressure (Exit)  124/72    Heart Rate (Admit)  55 bpm    Heart Rate (Exercise)  107 bpm    Heart Rate (Exit)  68 bpm    Rating of Perceived Exertion (Exercise)  13    Perceived Dyspnea (Exercise)  0    Symptoms  None    Comments  None    Duration  Continue with 30 min of aerobic exercise without signs/symptoms of physical distress.    Intensity  THRR unchanged      Progression   Progression  Continue to progress workloads to maintain intensity without signs/symptoms of physical distress.    Average METs  3.9      Resistance Training   Training Prescription  Yes    Weight  5lbs    Reps  10-15    Time  10 Minutes      NuStep   Level  4    SPM  95    Minutes  10    METs  4.7      Arm Ergometer   Level  2.5    Watts  40    Minutes  10    METs  3.5      Track   Laps  14    Minutes  10    METs  3.45      Home Exercise Plan   Plans to continue exercise at  Home (comment)   Walking   Frequency  Add 2 additional days to program exercise sessions.    Initial Home Exercises Provided  06/11/18       Nutrition:  Target Goals: Understanding of nutrition guidelines, daily intake of sodium 1500mg , cholesterol 200mg , calories 30% from fat and 7% or less from saturated fats, daily to have 5 or more servings of fruits and vegetables.  Biometrics: Pre Biometrics - 05/06/18 1045      Pre Biometrics   Height   (1.803 m)    Weight  126 kg    Waist Circumference  49 inches    Hip Circumference  53.5 inches    Waist to Hip Ratio  0.92 %    BMI  (Calculated)  38.76    Triceps Skinfold  24 mm    % Body Fat  37.6 %    Grip Strength  44 kg    Flexibility  9.5 in    Single Leg Stand  7.84 seconds        Nutrition Therapy Plan and Nutrition Goals: Nutrition Therapy & Goals - 05/06/18 1427      Nutrition Therapy   Diet  heart healthy      Personal Nutrition Goals   Nutrition Goal  Pt to identify and limit food sources of saturated fat, trans fat, refined carbohydrates and sodium    Personal Goal #2  Pt to identify food quantities necessary to achieve weight loss of 6-24 lbs. at graduation from cardiac rehab.    Personal Goal #3  Pt to identify and limit food sources of saturated fat, trans fat, refined carbohydrates and sodium      Intervention Plan   Intervention  Prescribe, educate and counsel regarding individualized specific dietary modifications aiming towards  targeted core components such as weight, hypertension, lipid management, diabetes, heart failure and other comorbidities.    Expected Outcomes  Short Term Goal: Understand basic principles of dietary content, such as calories, fat, sodium, cholesterol and nutrients.;Long Term Goal: Adherence to prescribed nutrition plan.       Nutrition Assessments: Nutrition Assessments - 05/06/18 1428      MEDFICTS Scores   Pre Score  56       Nutrition Goals Re-Evaluation: Nutrition Goals Re-Evaluation    Row Name 05/06/18 1427             Goals   Current Weight  277 lb 12.5 oz (126 kg)          Nutrition Goals Re-Evaluation: Nutrition Goals Re-Evaluation    Row Name 05/06/18 1427             Goals   Current Weight  277 lb 12.5 oz (126 kg)          Nutrition Goals Discharge (Final Nutrition Goals Re-Evaluation): Nutrition Goals Re-Evaluation - 05/06/18 1427      Goals   Current Weight  277 lb 12.5 oz (126 kg)       Psychosocial: Target Goals: Acknowledge presence or absence of significant depression and/or stress, maximize coping skills, provide  positive support system. Participant is able to verbalize types and ability to use techniques and skills needed for reducing stress and depression.  Initial Review & Psychosocial Screening: Initial Psych Review & Screening - 05/06/18 0942      Initial Review   Current issues with  None Identified      Family Dynamics   Good Support System?  Yes   Ronald Hull lists his wife, children, and close friends as sources of support.      Barriers   Psychosocial barriers to participate in program  There are no identifiable barriers or psychosocial needs.      Screening Interventions   Interventions  Encouraged to exercise;Provide feedback about the scores to participant    Expected Outcomes  Short Term goal: Identification and review with participant of any Quality of Life or Depression concerns found by scoring the questionnaire.;Long Term goal: The participant improves quality of Life and PHQ9 Scores as seen by post scores and/or verbalization of changes       Quality of Life Scores: Quality of Life - 05/06/18 0949      Quality of Life   Select  Quality of Life      Quality of Life Scores   Health/Function Pre  16.6 %    Socioeconomic Pre  19.5 %    Psych/Spiritual Pre  18.93 %    Family Pre  22.4 %    GLOBAL Pre  18.57 %      Scores of 19 and below usually indicate a poorer quality of life in these areas.  A difference of  2-3 points is a clinically meaningful difference.  A difference of 2-3 points in the total score of the Quality of Life Index has been associated with significant improvement in overall quality of life, self-image, physical symptoms, and general health in studies assessing change in quality of life.  PHQ-9: Recent Review Flowsheet Data    Depression screen [facility] 2/9 05/10/2018 02/12/2015   Decreased Interest 0 0   Down, Depressed, Hopeless 0 0   PHQ - 2 Score 0 0     Interpretation of Total Score  Total Score Depression Severity:  1-4 = Minimal depression, 5-9 = Mild  depression, 10-14 = Moderate depression, 15-19 = Moderately severe depression, 20-27 = Severe depression   Psychosocial Evaluation and Intervention: Psychosocial Evaluation - 06/03/18 1044      Psychosocial Evaluation & Interventions   Interventions  Stress management education;Relaxation education;Encouraged to exercise with the program and follow exercise prescription    Comments  Ronald Hull has had his ablation.  He is hopeful to maintain NSR.     Expected Outcomes  Ronald Hull will maintain a positive outlook with good coping skills.     Continue Psychosocial Services   Follow up required by staff       Psychosocial Re-Evaluation: Psychosocial Re-Evaluation    Row Name 06/03/18 1047 07/01/18 1504 07/15/18 1606         Psychosocial Re-Evaluation   Current issues with  None Identified  None Identified  None Identified     Comments  No psychosocial interventions.  Ronald Hull is back in [employer].  Emotional support given.   No psychosocial interventions necessary.     Expected Outcomes  Ronald Hull will maintain a positive outlook.   Ronald Hull will maintain a positive outlook.   Ronald Hull will maintain a positive outlook.      Interventions  Relaxation education;Stress management education;Encouraged to attend Cardiac Rehabilitation for the exercise  Relaxation education;Stress management education;Encouraged to attend Cardiac Rehabilitation for the exercise  Relaxation education;Stress management education;Encouraged to attend Cardiac Rehabilitation for the exercise     Continue Psychosocial Services   No Follow up required  Follow up required by staff  No Follow up required        Psychosocial Discharge (Final Psychosocial Re-Evaluation): Psychosocial Re-Evaluation - 07/15/18 1606      Psychosocial Re-Evaluation   Current issues with  None Identified    Comments  No psychosocial interventions necessary.    Expected Outcomes  Ronald Hull will maintain a positive outlook.     Interventions  Relaxation education;Stress management  education;Encouraged to attend Cardiac Rehabilitation for the exercise    Continue Psychosocial Services   No Follow up required       Vocational Rehabilitation: Provide vocational rehab assistance to qualifying candidates.   Vocational Rehab Evaluation & Intervention: Vocational Rehab - 05/06/18 0950      Initial Vocational Rehab Evaluation & Intervention   Assessment shows need for Vocational Rehabilitation  No       Education: Education Goals: Education classes will be provided on a weekly basis, covering required topics. Participant will state understanding/return demonstration of topics presented.  Learning Barriers/Preferences: Learning Barriers/Preferences - 05/06/18 1036      Learning Barriers/Preferences   Learning Barriers  Sight    Learning Preferences  Written Material;Verbal Instruction       Education Topics: Count Your Pulse:  -Group instruction provided by verbal instruction, demonstration, patient participation and written materials to support subject.  Instructors address importance of being able to find your pulse and how to count your pulse when at home without a heart monitor.  Patients get hands on experience counting their pulse with staff help and individually.   CARDIAC REHAB PHASE II EXERCISE from 07/09/2018 in [facility] CARDIAC REHAB  Date  06/25/18  Educator  RN  Instruction Review Code  2- Demonstrated Understanding      Heart Attack, Angina, and Risk Factor Modification:  -Group instruction provided by verbal instruction, video, and written materials to support subject.  Instructors address signs and symptoms of angina and heart attacks.    Also discuss risk factors for heart disease and  how to make changes to improve heart health risk factors.   Functional Fitness:  -Group instruction provided by verbal instruction, demonstration, patient participation, and written materials to support subject.  Instructors address safety  measures for doing things around the house.  Discuss how to get up and down off the floor, how to pick things up properly, how to safely get out of a chair without assistance, and balance training.   Meditation and Mindfulness:  -Group instruction provided by verbal instruction, patient participation, and written materials to support subject.  Instructor addresses importance of mindfulness and meditation practice to help reduce stress and improve awareness.  Instructor also leads participants through a meditation exercise.    Stretching for Flexibility and Mobility:  -Group instruction provided by verbal instruction, patient participation, and written materials to support subject.  Instructors lead participants through series of stretches that are designed to increase flexibility thus improving mobility.  These stretches are additional exercise for major muscle groups that are typically performed during regular warm up and cool down.   Hands Only CPR:  -Group verbal, video, and participation provides a basic overview of AHA guidelines for community CPR. Role-play of emergencies allow participants the opportunity to practice calling for help and chest compression technique with discussion of AED use.   Hypertension: -Group verbal and written instruction that provides a basic overview of hypertension including the most recent diagnostic guidelines, risk factor reduction with self-care instructions and medication management.   CARDIAC REHAB PHASE II EXERCISE from 07/09/2018 in [facility] CARDIAC REHAB  Date  07/09/18  Educator  RN  Instruction Review Code  2- Demonstrated Understanding       Nutrition I class: Heart Healthy Eating:  -Group instruction provided by PowerPoint slides, verbal discussion, and written materials to support subject matter. The instructor gives an explanation and review of the Therapeutic Lifestyle Changes diet recommendations, which includes a  discussion on lipid goals, dietary fat, sodium, fiber, plant stanol/sterol esters, sugar, and the components of a well-balanced, healthy diet.   Nutrition II class: Lifestyle Skills:  -Group instruction provided by PowerPoint slides, verbal discussion, and written materials to support subject matter. The instructor gives an explanation and review of label reading, grocery shopping for heart health, heart healthy recipe modifications, and ways to make healthier choices when eating out.   Diabetes Question & Answer:  -Group instruction provided by PowerPoint slides, verbal discussion, and written materials to support subject matter. The instructor gives an explanation and review of diabetes co-morbidities, pre- and post-prandial blood glucose goals, pre-exercise blood glucose goals, signs, symptoms, and treatment of hypoglycemia and hyperglycemia, and foot care basics.   Diabetes Blitz:  -Group instruction provided by PowerPoint slides, verbal discussion, and written materials to support subject matter. The instructor gives an explanation and review of the physiology behind type 1 and type 2 diabetes, diabetes medications and rational behind using different medications, pre- and post-prandial blood glucose recommendations and Hemoglobin A1c goals, diabetes diet, and exercise including blood glucose guidelines for exercising safely.    Portion Distortion:  -Group instruction provided by PowerPoint slides, verbal discussion, written materials, and food models to support subject matter. The instructor gives an explanation of serving size versus portion size, changes in portions sizes over the last 20 years, and what consists of a serving from each food group.   Stress Management:  -Group instruction provided by verbal instruction, video, and written materials to support subject matter.  Instructors review role of stress in heart disease  and how to cope with stress positively.     Exercising on Your  Own:  -Group instruction provided by verbal instruction, power point, and written materials to support subject.  Instructors discuss benefits of exercise, components of exercise, frequency and intensity of exercise, and end points for exercise.  Also discuss use of nitroglycerin and activating EMS.  Review options of places to exercise outside of rehab.  Review guidelines for sex with heart disease.   Cardiac Drugs I:  -Group instruction provided by verbal instruction and written materials to support subject.  Instructor reviews cardiac drug classes: antiplatelets, anticoagulants, beta blockers, and statins.  Instructor discusses reasons, side effects, and lifestyle considerations for each drug class.   Cardiac Drugs II:  -Group instruction provided by verbal instruction and written materials to support subject.  Instructor reviews cardiac drug classes: angiotensin converting enzyme inhibitors (ACE-I), angiotensin II receptor blockers (ARBs), nitrates, and calcium channel blockers.  Instructor discusses reasons, side effects, and lifestyle considerations for each drug class.   Anatomy and Physiology of the Circulatory System:  Group verbal and written instruction and models provide basic cardiac anatomy and physiology, with the coronary electrical and arterial systems. Review of: AMI, Angina, Valve disease, Heart Failure, Peripheral Artery Disease, Cardiac Arrhythmia, Pacemakers, and the ICD.   CARDIAC REHAB PHASE II EXERCISE from 07/09/2018 in [facility] CARDIAC REHAB  Date  06/02/18  Educator  RN  Instruction Review Code  2- Demonstrated Understanding      Other Education:  -Group or individual verbal, written, or video instructions that support the educational goals of the cardiac rehab program.   Holiday Eating Survival Tips:  -Group instruction provided by PowerPoint slides, verbal discussion, and written materials to support subject matter. The instructor gives  patients tips, tricks, and techniques to help them not only survive but enjoy the holidays despite the onslaught of food that accompanies the holidays.   Knowledge Questionnaire Score: Knowledge Questionnaire Score - 05/06/18 1036      Knowledge Questionnaire Score   Pre Score  19/24       Core Components/Risk Factors/Patient Goals at Admission: Personal Goals and Risk Factors at Admission - 05/06/18 1040      Core Components/Risk Factors/Patient Goals on Admission    Weight Management  Yes;Obesity    Intervention  Weight Management: Develop a combined nutrition and exercise program designed to reach desired caloric intake, while maintaining appropriate intake of nutrient and fiber, sodium and fats, and appropriate energy expenditure required for the weight goal.;Weight Management: Provide education and appropriate resources to help participant work on and attain dietary goals.;Obesity: Provide education and appropriate resources to help participant work on and attain dietary goals.;Weight Management/Obesity: Establish reasonable short term and long term weight goals.    Admit Weight  277 lb 12.5 oz (126 kg)    Goal Weight: Short Term  260 lb (117.9 kg)    Goal Weight: Long Term  230 lb (104.3 kg)    Expected Outcomes  Short Term: Continue to assess and modify interventions until short term weight is achieved;Long Term: Adherence to nutrition and physical activity/exercise program aimed toward attainment of established weight goal;Weight Loss: Understanding of general recommendations for a balanced deficit meal plan, which promotes 1-2 lb weight loss per week and includes a negative energy balance of [phone] kcal/d;Understanding recommendations for meals to include 15-35% energy as protein, 25-35% energy from fat, 35-60% energy from carbohydrates, less than  of dietary cholesterol, 20-35 gm of total fiber daily;Understanding of  distribution of calorie intake throughout the day with the  consumption of 4-5 meals/snacks    Hypertension  Yes    Intervention  Provide education on lifestyle modifcations including regular physical activity/exercise, weight management, moderate sodium restriction and increased consumption of fresh fruit, vegetables, and low fat dairy, alcohol moderation, and smoking cessation.;Monitor prescription use compliance.    Expected Outcomes  Short Term: Continued assessment and intervention until BP is < 140/83mm HG in hypertensive participants. < 130/16mm HG in hypertensive participants with diabetes, heart failure or chronic kidney disease.;Long Term: Maintenance of blood pressure at goal levels.    Lipids  Yes    Intervention  Provide education and support for participant on nutrition & aerobic/resistive exercise along with prescribed medications to achieve LDL 70mg , HDL >40mg .    Expected Outcomes  Short Term: Participant states understanding of desired cholesterol values and is compliant with medications prescribed. Participant is following exercise prescription and nutrition guidelines.;Long Term: Cholesterol controlled with medications as prescribed, with individualized exercise RX and with personalized nutrition plan. Value goals: LDL < 70mg , HDL > 40 mg.       Core Components/Risk Factors/Patient Goals Review:  Goals and Risk Factor Review    Row Name 05/10/18 1542 06/03/18 1048 07/01/18 1504 07/15/18 1606       Core Components/Risk Factors/Patient Goals Review   Personal Goals Review  Weight Management/Obesity;Lipids;Hypertension  Weight Management/Obesity;Lipids;Hypertension  Weight Management/Obesity;Lipids;Hypertension  Weight Management/Obesity;Lipids;Hypertension    Review  Pt willing to participate in CR exericise.  Ronald Hull would like to increase his exercise tolerance and stamina.  He would also like to continue to lose weight.   Pt willing to participate in CR exericise.  Ronald Hull was on hold for his ablation and subsequent recovery.  He has returned  to exercise and tolerated it well, maintaining NSR.   Pt with multple CAD RFs willing to participate in CR exericise.  Pt has tolerated exercise well.  Unfortunately, when he was on vacation Ronald Hull developed Atrial Fib.  Will continue to monitor pt.   Pt continues to tolerate exercise well.  Exercise currently on hold as department is closed per recommended guidelines from federal government to prevent spread of COVID-19.     Expected Outcomes  Pt will continue to participate in CR exercise, nutrition, and lifestyle modification opportunities.   Pt will continue to participate in CR exercise, nutrition, and lifestyle modification opportunities.   Pt will continue to participate in CR exercise, nutrition, and lifestyle modification opportunities.   Pt will continue to participate in CR exercise, nutrition, and lifestyle modification opportunities.        Core Components/Risk Factors/Patient Goals at Discharge (Final Review):  Goals and Risk Factor Review - 07/15/18 1606      Core Components/Risk Factors/Patient Goals Review   Personal Goals Review  Weight Management/Obesity;Lipids;Hypertension    Review  Pt continues to tolerate exercise well.  Exercise currently on hold as department is closed per recommended guidelines from federal government to prevent spread of COVID-19.     Expected Outcomes  Pt will continue to participate in CR exercise, nutrition, and lifestyle modification opportunities.        ITP Comments: ITP Comments    Row Name 05/06/18 0940 05/06/18 1032 05/10/18 1537 06/03/18 1051 07/01/18 1502   ITP Comments  Dr. Armanda Magic, Medical Director  Dr. Armanda Magic, Medical Director  Pt started exercise today and tolerated it fairly well.  HR elevated at times above THR.   30 Day ITP Review. Ronald Hull was  on hold for his ablation and subsequent recovery.  He has returned to exercise and tolerated it well, maintaining NSR.  30 Day ITP Review.  Pt has tolerated exercise well.  Unfortunately, when  he was on vacation Ronald Hull developed Atrial Fib.  Will continue to monitor pt.    Row Name 07/15/18 1605           ITP Comments  30 Day ITP Review.  PPt continues to tolerate exercise well.  Exercise currently on hold as department is closed per recommended guidelines from federal government to prevent spread of COVID-19.           Comments: See ITP Comments.

## 2018-07-23 ENCOUNTER — Encounter (HOSPITAL_COMMUNITY): Payer: Medicare Other

## 2018-07-23 ENCOUNTER — Ambulatory Visit (HOSPITAL_COMMUNITY): Payer: Medicare Other

## 2018-07-26 ENCOUNTER — Ambulatory Visit (HOSPITAL_COMMUNITY): Payer: Medicare Other

## 2018-07-26 ENCOUNTER — Encounter (HOSPITAL_COMMUNITY): Payer: Medicare Other

## 2018-07-28 ENCOUNTER — Encounter (HOSPITAL_COMMUNITY): Payer: Medicare Other

## 2018-07-28 ENCOUNTER — Ambulatory Visit (HOSPITAL_COMMUNITY): Payer: Medicare Other

## 2018-07-28 ENCOUNTER — Telehealth (HOSPITAL_COMMUNITY): Payer: Self-pay | Admitting: *Deleted

## 2018-07-28 NOTE — Telephone Encounter (Signed)
Pt called in stating he went back into afib overnight. He does endorse he missed his nighttime medications (including metoprolol and eliquis). He is mostly asympomatic just knows his in AF. He states his bp is 118/75 HR 84-130s and irratic. He just took his morning medication about 1 hour ago. He will take an extra 25mg  of metoprolol at lunchtime if still out of rhythm and call tomorrow with update. Pt in agreement

## 2018-07-30 ENCOUNTER — Encounter (HOSPITAL_COMMUNITY): Payer: Self-pay

## 2018-07-30 ENCOUNTER — Encounter (HOSPITAL_COMMUNITY): Payer: Medicare Other

## 2018-07-30 ENCOUNTER — Ambulatory Visit (HOSPITAL_COMMUNITY): Payer: Medicare Other

## 2018-07-30 ENCOUNTER — Ambulatory Visit (HOSPITAL_COMMUNITY)
Admission: RE | Admit: 2018-07-30 | Discharge: 2018-07-30 | Disposition: A | Discharge status: Home / Self Care | Payer: Medicare Other | Source: Ambulatory Visit | Attending: Physician Assistant | Admitting: Physician Assistant

## 2018-07-30 ENCOUNTER — Other Ambulatory Visit: Payer: Self-pay

## 2018-07-30 VITALS — BP 120/65 | HR 95

## 2018-07-30 DIAGNOSIS — G4733 Obstructive sleep apnea (adult) (pediatric): Secondary | ICD-10-CM

## 2018-07-30 DIAGNOSIS — I2510 Atherosclerotic heart disease of native coronary artery without angina pectoris: Secondary | ICD-10-CM

## 2018-07-30 DIAGNOSIS — E669 Obesity, unspecified: Secondary | ICD-10-CM

## 2018-07-30 DIAGNOSIS — I4819 Other persistent atrial fibrillation: Secondary | ICD-10-CM | POA: Diagnosis not present

## 2018-07-30 DIAGNOSIS — I10 Essential (primary) hypertension: Secondary | ICD-10-CM

## 2018-07-30 NOTE — Progress Notes (Signed)
Electrophysiology TeleHealth Note   Due to national recommendations of social distancing due to COVID 19, Audio/video telehealth visit is felt to be most appropriate for this patient at this time.  See MyChart message from today for patient consent regarding telehealth for the Atrial Fibrillation Clinic.    Date:  07/30/2018   ID:  Ronald Hull, DOB 24-May-1945, MRN [id number]  Location: home  Provider location: [address] [place], [place] [address] Evaluation Performed: Follow up  PCP:  Renford Dills, MD  Primary Cardiologist:  Dr Excell Seltzer Primary Electrophysiologist: Dr Elberta Fortis   CC: Follow up for atrial fibrillation   History of Present Illness: Ronald Hull is a 73 y.o. male who presents via audio/video conferencing for a telehealth visit today. Patient reports that he has done well since his DCCV on 06/30/18 until the morning of 07/28/18 when his Apple Watch alerted him that he was in afib. He does note some increased SOB on exertion while walking up stairs or hills. He took an extra dose of metoprolol with no change in symptoms. He sent in his Apple Watch strips which do show rate controlled afib. He admits that he missed his night medications including his BB and Eliquis prior to going into afib.  Today, he denies symptoms of palpitations, chest pain, orthopnea, PND, lower extremity edema, claudication, dizziness, presyncope, syncope, bleeding, or neurologic sequela. The patient is tolerating medications without difficulties and is otherwise without complaint today.   he denies symptoms of cough, fevers, chills, or new SOB worrisome for COVID 19.     Atrial Fibrillation Risk Factors:  he does have symptoms or diagnosis of sleep apnea. he is compliant with CPAP therapy. he does not have a history of rheumatic fever. he does not have a history of alcohol use.  he has a BMI of There is no height or weight on file to calculate BMI..  BP 120/65 Pulse 95 Provided by  patient  Past Medical History:  Diagnosis Date  . Arthritis   . Bronchitis 05/23/2018  . CAD (coronary artery disease) 02/24/2018   LHC 10/19: pLAD 50, mLAD 50 (dFFR=0.89), oLCx 95, pRCA 30, mRCA 80 >> PCI: DES to LCx; DES to RCA // X[employer] Trial participant (will DC Plavix after 30 d and remain on ASA + Apixaban)  . Chest pain secondary to ablation, with expected mild elevation in troponin 05/22/2018  . Complication of anesthesia    BP dropped post colonoscopy  . Hypercholesteremia   . Hypertension   . Persistent atrial fibrillation   . Sleep apnea    does use a cpap  . Wears glasses    Past Surgical History:  Procedure Laterality Date  . ATRIAL FIBRILLATION ABLATION N/A 05/20/2018   Procedure: ATRIAL FIBRILLATION ABLATION;  Surgeon: Regan Lemming, MD;  Location: [facility] INVASIVE CV LAB;  Service: Cardiovascular;  Laterality: N/A;  . CARDIOVERSION N/A 11/01/2015   Procedure: CARDIOVERSION;  Surgeon: [name], MD;  Location: [facility] ENDOSCOPY;  Service: Cardiovascular;  Laterality: N/A;  . CARDIOVERSION N/A 11/23/2015   Procedure: CARDIOVERSION;  Surgeon: Will [name], MD;  Location: [facility] ENDOSCOPY;  Service: Cardiovascular;  Laterality: N/A;  . CARDIOVERSION N/A 10/12/2017   Procedure: CARDIOVERSION;  Surgeon: [name], MD;  Location: [facility] ENDOSCOPY;  Service: Cardiovascular;  Laterality: N/A;  . CARDIOVERSION N/A 06/30/2018   Procedure: CARDIOVERSION;  Surgeon: Jake Bathe, MD;  Location: [facility] ENDOSCOPY;  Service: Cardiovascular;  Laterality: N/A;  . COLONOSCOPY    .  CORONARY STENT INTERVENTION N/A 02/24/2018   Procedure: CORONARY STENT INTERVENTION;  Surgeon: Tonny Bollman, MD;  Location: [facility] INVASIVE CV LAB;  Service: Cardiovascular;  Laterality: N/A;  . INTRAVASCULAR PRESSURE WIRE/FFR STUDY N/A 02/24/2018   Procedure: INTRAVASCULAR PRESSURE WIRE/FFR STUDY;  Surgeon: Tonny Bollman, MD;  Location: [facility] INVASIVE CV LAB;  Service: Cardiovascular;  Laterality: N/A;  . KNEE  ARTHROSCOPY  2000  . LEFT HEART CATH AND CORONARY ANGIOGRAPHY N/A 02/24/2018   Procedure: LEFT HEART CATH AND CORONARY ANGIOGRAPHY;  Surgeon: Tonny Bollman, MD;  Location: [facility] INVASIVE CV LAB;  Service: Cardiovascular;  Laterality: N/A;  . SHOULDER ARTHROSCOPY WITH ROTATOR CUFF REPAIR AND SUBACROMIAL DECOMPRESSION Right 07/15/2012   Procedure: RIGHT ARTHROSCOPY SHOULDER DECOMPRESSION  SUBACROMIAL PARTIAL ACROMIOPLASTY WITH CORACOACROMIAL RELEASE,  DISTAL CLAVICULECTOMY, resect biceps debride labrium WITH ROTATOR CUFF REPAIR;  Surgeon: [name], MD;  Location: [facility];  Service: Orthopedics;  Laterality: Right;     Current Outpatient Medications  Medication Sig Dispense Refill  . acetaminophen (TYLENOL) 325 MG tablet Take 325 mg by mouth every 6 (six) hours as needed (for pain.).     Marland Kitchen ASPIRIN LOW DOSE 81 MG chewable tablet Chew 81 mg by mouth every evening.   0  . atorvastatin (LIPITOR) 40 MG tablet Take 1 tablet (40 mg total) by mouth daily. (Patient taking differently: Take 40 mg by mouth at bedtime. ) 90 tablet 3  . ELIQUIS 5 MG TABS tablet TAKE 1 TABLET(5 MG) BY MOUTH TWICE DAILY (Patient taking differently: Take 5 mg by mouth 2 (two) times daily. ) 60 tablet 5  . metoprolol tartrate (LOPRESSOR) 50 MG tablet Take 1 tablet (50 mg total) by mouth 2 (two) times daily. 180 tablet 3  . nitroGLYCERIN (NITROSTAT) 0.4 MG SL tablet Place 1 tablet (0.4 mg total) under the tongue every 5 (five) minutes as needed. (Patient not taking: Reported on 07/09/2018) 25 tablet 2  . zaleplon (SONATA) 5 MG capsule Take 5 mg by mouth at bedtime as needed for sleep.     No current facility-administered medications for this encounter.     Allergies:   Lisinopril   Social History:  The patient  reports that he quit smoking about 45 years ago. He has never used smokeless tobacco. He reports current alcohol use of about 2.0 standard drinks of alcohol per week. He reports that he does not use  drugs.   Family History:  The patient's  family history includes Cancer in his sister; Clotting disorder in his mother; Heart disease in his brother; Heart failure in his father; Hypertension in his sister; Other in his mother.    ROS:  Please see the history of present illness.   All other systems are personally reviewed and negative.   Exam: Well appearing, alert and conversant, regular work of breathing,  good skin color  Recent Labs: 04/09/2018: ALT 26 05/22/2018: B Natriuretic Peptide 83.0 06/24/2018: BUN 11; Creatinine, Ser 0.98; Hemoglobin 14.6; Platelets 167; Potassium 4.3; Sodium 136  personally reviewed    Other studies personally reviewed: Additional studies/ records that were reviewed today include: Epic notes   The patient presents wearable device technology report for my review today. On my review, the patient presents Apple Watch tracings from 07/30/18. The tracings reveal afib HR 99, PVC    ASSESSMENT AND PLAN:  1. Persistent atrial fibrillation S/p ablation 05/20/18 with Dr Elberta Fortis. S/p DCCV 06/30/18. Patient back in afib with rates mostly controlled per patient's Apple Watch.  We discussed therapeutic  options including reloading amiodarone. Will consider starting amiodarone at next follow up visit as patient needs to be on 3 full weeks of anticoagulation without missed doses. May need repeat ablation in the future. Continue metoprolol 50 mg BID. May take an extra dose for heart racing. Continue Eliquis 5 mg BID  This patients CHA2DS2-VASc Score and unadjusted Ischemic Stroke Rate (% per year) is equal to 3.2 % stroke rate/year from a score of 3  Above score calculated as 1 point each if present [CHF, HTN, DM, Vascular=MI/PAD/Aortic Plaque, Age if 65-74, or Male] Above score calculated as 2 points each if present [Age > 75, or Stroke/TIA/TE]  2. CAD S/p DES to RCA and LCx 01/2018. No anginal symptoms.  3. HTN Stable, no changes today  4. OSA Patient  compliant with CPAP therapy.  5. Obesity Lifestyle modification was discussed and encouraged including regular physical activity and weight reduction.   COVID screen The patient does not have any symptoms that suggest any further testing/ screening at this time.  Social distancing reinforced today.    Follow-up with Dr Elberta Fortis as scheduled.  Current medicines are reviewed at length with the patient today.   The patient does not have concerns regarding his medicines.  The following changes were made today:  none  Labs/ tests ordered today include:  No orders of the defined types were placed in this encounter.   Patient Risk:  after full review of this patients clinical status, I feel that they are at moderate risk at this time.   Today, I have spent 15 minutes with the patient with telehealth technology discussing atrial fibrillation, amiodarone, and importance of anticoagulation.    Dalia Heading PA-C 07/30/2018 1:16 PM  Afib Clinic [facility] [address] [place], [place] [address] [phone]

## 2018-07-30 NOTE — Telephone Encounter (Signed)
Patient called back into report still in AF. Questioning if he should resume any of his previous AAT drugs (has been on multaq, flecainide and amiodarone). Pt states his exercise tolerance is reduced but no shortness of breath. HR running anywhere from 80-140s with activity. Will schedule for visit with [name], PA this afternoon.

## 2018-08-02 ENCOUNTER — Ambulatory Visit (HOSPITAL_COMMUNITY): Payer: Medicare Other

## 2018-08-02 ENCOUNTER — Encounter (HOSPITAL_COMMUNITY): Payer: Medicare Other

## 2018-08-04 ENCOUNTER — Telehealth (HOSPITAL_COMMUNITY): Payer: Self-pay | Admitting: *Deleted

## 2018-08-04 ENCOUNTER — Ambulatory Visit (HOSPITAL_COMMUNITY): Payer: Medicare Other

## 2018-08-04 ENCOUNTER — Encounter (HOSPITAL_COMMUNITY): Payer: Medicare Other

## 2018-08-04 NOTE — Telephone Encounter (Signed)
Called to notify patient that the cardiac and pulmonary rehabilitation department remains closed at this time due to COVID-19 restrictions. Pt verbalizes understanding. Patient is walking 30 minutes daily. Pt is very appreciative of the program and is looking forward to returning.  Artist Pais, MS, ACSM CEP 08/04/2018

## 2018-08-06 ENCOUNTER — Ambulatory Visit (HOSPITAL_COMMUNITY): Payer: Medicare Other

## 2018-08-06 ENCOUNTER — Encounter (HOSPITAL_COMMUNITY): Payer: Medicare Other

## 2018-08-09 ENCOUNTER — Ambulatory Visit (HOSPITAL_COMMUNITY): Payer: Medicare Other

## 2018-08-09 ENCOUNTER — Encounter (HOSPITAL_COMMUNITY): Payer: Medicare Other

## 2018-08-10 DIAGNOSIS — Z006 Encounter for examination for normal comparison and control in clinical research program: Secondary | ICD-10-CM

## 2018-08-10 NOTE — Research (Signed)
Xience 28 Research 6 Month Follow-Up:   Pt is doing okay at this time, he has recently gone back in Atrial Fibrillation, but states that he is feeling well. Cardiac Rehabilitation is held at this time due to Covid 19 however, the patient is able to walk regularly. Will continue to follow up with patient.    Contact Type:  []  Office/hospital [x]  Phone  Subject compliant with DAPT regimen per protocol requirement?  [x]  Yes  []  No  Any changes in antiplatelet medication? []  Yes  [x]  No  Any new or changed adverse events? []  Yes  [x]  No  Any new or changed anticoagulant medication? []  Yes  [x]  No     Only to be used at 1 month follow up visit     Subject is free from Myocardial Infarction, repeat coronary revascularization, stroke, or stent thrombosis within the 1 month after stenting? []  Yes  []  No  Subject is compliant with 1 month DAPT without interruption of either ASA and/or P2Y12 receptor inhibitor for >7 consecutive days []  Yes  []  No  Investigator is comfortable with stopping P2Y12 inhibitor after 43-month visit and subject has been instructed to stop P2Y12? []  Yes  []  No  If questions 3, 4, and 5 are all Yes - Subject is 1 month clear? []  Yes  []  No

## 2018-08-11 ENCOUNTER — Ambulatory Visit (HOSPITAL_COMMUNITY): Payer: Medicare Other

## 2018-08-11 ENCOUNTER — Encounter (HOSPITAL_COMMUNITY): Payer: Medicare Other

## 2018-08-17 ENCOUNTER — Telehealth: Payer: Self-pay | Admitting: *Deleted

## 2018-08-17 NOTE — Telephone Encounter (Signed)
Virtual Visit Pre-Appointment Phone Call  "(Name), I am calling you today to discuss your upcoming appointment. We are currently trying to limit exposure to the virus that causes COVID-19 by seeing patients at home rather than in the office."  1. "What is the BEST phone number to call the day of the visit?" - include this in appointment notes  2. "Do you have or have access to (through a family member/friend) a smartphone with video capability that we can use for your visit?" a. If yes - list this number in appt notes as "cell" (if different from BEST phone #) and list the appointment type as a VIDEO visit in appointment notes b. If no - list the appointment type as a PHONE visit in appointment notes  3. Confirm consent - "In the setting of the current Covid19 crisis, you are scheduled for a (phone or video) visit with your provider on (date) at (time).  Just as we do with many in-office visits, in order for you to participate in this visit, we must obtain consent.  If you'd like, I can send this to your mychart (if signed up) or email for you to review.  Otherwise, I can obtain your verbal consent now.  All virtual visits are billed to your insurance company just like a normal visit would be.  By agreeing to a virtual visit, we'd like you to understand that the technology does not allow for your provider to perform an examination, and thus may limit your provider's ability to fully assess your condition. If your provider identifies any concerns that need to be evaluated in person, we will make arrangements to do so.  Finally, though the technology is pretty good, we cannot assure that it will always work on either your or our end, and in the setting of a video visit, we may have to convert it to a phone-only visit.  In either situation, we cannot ensure that we have a secure connection.  Are you willing to proceed?" STAFF: Did the patient verbally acknowledge consent to telehealth visit? Document  YES/NO here: YES  4. Advise patient to be prepared - "Two hours prior to your appointment, go ahead and check your blood pressure, pulse, oxygen saturation, and your weight (if you have the equipment to check those) and write them all down. When your visit starts, your provider will ask you for this information. If you have an Apple Watch or Kardia device, please plan to have heart rate information ready on the day of your appointment. Please have a pen and paper handy nearby the day of the visit as well."  5. Give patient instructions for MyChart download to smartphone OR Doximity/Doxy.me as below if video visit (depending on what platform provider is using)  6. Inform patient they will receive a phone call 15 minutes prior to their appointment time (may be from unknown caller ID) so they should be prepared to answer    TELEPHONE CALL NOTE  Ronald Hull has been deemed a candidate for a follow-up tele-health visit to limit community exposure during the Covid-19 pandemic. I spoke with the patient via phone to ensure availability of phone/video source, confirm preferred email & phone number, and discuss instructions and expectations.  I reminded Ronald Hull to be prepared with any vital sign and/or heart rhythm information that could potentially be obtained via home monitoring, at the time of his visit. I reminded Ronald Hull to expect a phone call prior to  his visit.  Ronald Hull 08/17/2018 10:49 AM   INSTRUCTIONS FOR DOWNLOADING THE MYCHART APP TO SMARTPHONE  - The patient must first make sure to have activated MyChart and know their login information - If Apple, go to [employer] and type in MyChart in the search bar and download the app. If Android, ask patient to go to [employer] and type in [place] in the search bar and download the app. The app is free but as with any other app downloads, their phone may require them to verify saved payment information or  Apple/Android password.  - The patient will need to then log into the app with their MyChart username and password, and select [facility] as their healthcare provider to link the account. When it is time for your visit, go to the MyChart app, find appointments, and click Begin Video Visit. Be sure to Select Allow for your device to access the Microphone and Camera for your visit. You will then be connected, and your provider will be with you shortly.  **If they have any issues connecting, or need assistance please contact MyChart service desk [phone])**  **If using a computer, in order to ensure the best quality for their visit they will need to use either of the following Internet Browsers: [employer] or Google Chrome**  IF USING DOXIMITY or DOXY.ME - The patient will receive a link just prior to their visit by text.     FULL LENGTH CONSENT FOR TELE-HEALTH VISIT   I hereby voluntarily request, consent and authorize [facility] and its employed or contracted physicians, physician assistants, nurse practitioners or other licensed health care professionals (the Practitioner), to provide me with telemedicine health care services (the "Services") as deemed necessary by the treating Practitioner. I acknowledge and consent to receive the Services by the Practitioner via telemedicine. I understand that the telemedicine visit will involve communicating with the Practitioner through live audiovisual communication technology and the disclosure of certain medical information by electronic transmission. I acknowledge that I have been given the opportunity to request an in-person assessment or other available alternative prior to the telemedicine visit and am voluntarily participating in the telemedicine visit.  I understand that I have the right to withhold or withdraw my consent to the use of telemedicine in the course of my care at any time, without affecting my right to future care  or treatment, and that the Practitioner or I may terminate the telemedicine visit at any time. I understand that I have the right to inspect all information obtained and/or recorded in the course of the telemedicine visit and may receive copies of available information for a reasonable fee.  I understand that some of the potential risks of receiving the Services via telemedicine include:  Marland Kitchen Delay or interruption in medical evaluation due to technological equipment failure or disruption; . Information transmitted may not be sufficient (e.g. poor resolution of images) to allow for appropriate medical decision making by the Practitioner; and/or  . In rare instances, security protocols could fail, causing a breach of personal health information.  Furthermore, I acknowledge that it is my responsibility to provide information about my medical history, conditions and care that is complete and accurate to the best of my ability. I acknowledge that Practitioner's advice, recommendations, and/or decision may be based on factors not within their control, such as incomplete or inaccurate data provided by me or distortions of diagnostic images or specimens that may result from electronic transmissions. I understand  that the practice of medicine is not an exact science and that Practitioner makes no warranties or guarantees regarding treatment outcomes. I acknowledge that I will receive a copy of this consent concurrently upon execution via email to the email address I last provided but may also request a printed copy by calling the office of [place].    I understand that my insurance will be billed for this visit.   I have read or had this consent read to me. . I understand the contents of this consent, which adequately explains the benefits and risks of the Services being provided via telemedicine.  . I have been provided ample opportunity to ask questions regarding this consent and the Services and have had  my questions answered to my satisfaction. . I give my informed consent for the services to be provided through the use of telemedicine in my medical care  By participating in this telemedicine visit I agree to the above.

## 2018-08-24 ENCOUNTER — Encounter: Payer: Self-pay | Admitting: Cardiology

## 2018-08-24 ENCOUNTER — Telehealth (INDEPENDENT_AMBULATORY_CARE_PROVIDER_SITE_OTHER): Payer: Medicare Other | Admitting: Cardiology

## 2018-08-24 ENCOUNTER — Other Ambulatory Visit: Payer: Self-pay | Admitting: Cardiology

## 2018-08-24 ENCOUNTER — Other Ambulatory Visit: Payer: Self-pay

## 2018-08-24 DIAGNOSIS — I4819 Other persistent atrial fibrillation: Secondary | ICD-10-CM | POA: Diagnosis not present

## 2018-08-24 MED ORDER — AMIODARONE HCL 200 MG PO TABS: 200.0000 mg | ORAL_TABLET | Freq: Every day | ORAL | 1 refills | Status: DC

## 2018-08-24 MED ORDER — AMIODARONE HCL 200 MG PO TABS: ORAL_TABLET | ORAL | 0 refills | Status: DC

## 2018-08-24 NOTE — H&P (View-Only) (Signed)
Electrophysiology TeleHealth Note   Due to national recommendations of social distancing due to COVID 19, an audio/video telehealth visit is felt to be most appropriate for this patient at this time.  See Epic message for the patient's consent to telehealth for [facility]   Date:  08/24/2018   ID:  Ronald Hull, DOB [birth year], MRN [id number]  Location: patient's home  Provider location: [address] [place]  Evaluation Performed: Follow-up visit  PCP:  Renford Dills, MD  Cardiologist:  Tonny Bollman, MD  Electrophysiologist:  Dr Elberta Fortis  Chief Complaint:  Atrial fibrillation  History of Present Illness:    Ronald Hull is a 73 y.o. male who presents via audio/video conferencing for a telehealth visit today.  Since last being seen in our clinic, the patient reports doing very well.  Today, he denies symptoms of palpitations, chest pain, shortness of breath,  lower extremity edema, dizziness, presyncope, or syncope.  The patient is otherwise without complaint today.  The patient denies symptoms of fevers, chills, cough, or new SOB worrisome for COVID 19.  He has a history of atrial fibrillation.  He is status post ablation 05/20/2018.  Today, denies symptoms of chest pain, shortness of breath, orthopnea, PND, lower extremity edema, claudication, dizziness, presyncope, syncope, bleeding, or neurologic sequela. The patient is tolerating medications without difficulties.  Unfortunately, he has gone into atrial fibrillation 2 times since his ablation.  The first time, he was on a trip to [place].  He traveled all day, walked 11,000 steps and was up most of the night.  He went into atrial fibrillation.  Went to the emergency room there and may control his rate.  He came back to [facility] and had a cardioversion.  He went back in atrial fibrillation the beginning of April.  He said that he missed 1 dose of metoprolol and Eliquis.  Since then he has had weakness and  fatigue.  Past Medical History:  Diagnosis Date   Arthritis    Bronchitis 05/23/2018   CAD (coronary artery disease) 02/24/2018   LHC 10/19: pLAD 50, mLAD 50 (dFFR=0.89), oLCx 95, pRCA 30, mRCA 80 >> PCI: DES to LCx; DES to RCA // Xience28 Trial participant (Lynise Porr DC Plavix after 30 d and remain on ASA + Apixaban)   Chest pain secondary to ablation, with expected mild elevation in troponin 05/22/2018   Complication of anesthesia    BP dropped post colonoscopy   Hypercholesteremia    Hypertension    Persistent atrial fibrillation    Sleep apnea    does use a cpap   Wears glasses     Past Surgical History:  Procedure Laterality Date   ATRIAL FIBRILLATION ABLATION N/A 05/20/2018   Procedure: ATRIAL FIBRILLATION ABLATION;  Surgeon: Regan Lemming, MD;  Location: [facility] INVASIVE CV LAB;  Service: Cardiovascular;  Laterality: N/A;   CARDIOVERSION N/A 11/01/2015   Procedure: CARDIOVERSION;  Surgeon: [name], MD;  Location: [facility] ENDOSCOPY;  Service: Cardiovascular;  Laterality: N/A;   CARDIOVERSION N/A 11/23/2015   Procedure: CARDIOVERSION;  Surgeon: Sreeja Spies [name], MD;  Location: [facility] ENDOSCOPY;  Service: Cardiovascular;  Laterality: N/A;   CARDIOVERSION N/A 10/12/2017   Procedure: CARDIOVERSION;  Surgeon: [name], MD;  Location: [facility] ENDOSCOPY;  Service: Cardiovascular;  Laterality: N/A;   CARDIOVERSION N/A 06/30/2018   Procedure: CARDIOVERSION;  Surgeon: Jake Bathe, MD;  Location: [facility] ENDOSCOPY;  Service: Cardiovascular;  Laterality: N/A;   COLONOSCOPY     CORONARY  STENT INTERVENTION N/A 02/24/2018   Procedure: CORONARY STENT INTERVENTION;  Surgeon: Tonny Bollman, MD;  Location: [facility] INVASIVE CV LAB;  Service: Cardiovascular;  Laterality: N/A;   INTRAVASCULAR PRESSURE WIRE/FFR STUDY N/A 02/24/2018   Procedure: INTRAVASCULAR PRESSURE WIRE/FFR STUDY;  Surgeon: Tonny Bollman, MD;  Location: [facility] INVASIVE CV LAB;  Service: Cardiovascular;  Laterality: N/A;    KNEE ARTHROSCOPY  2000   LEFT HEART CATH AND CORONARY ANGIOGRAPHY N/A 02/24/2018   Procedure: LEFT HEART CATH AND CORONARY ANGIOGRAPHY;  Surgeon: Tonny Bollman, MD;  Location: [facility] INVASIVE CV LAB;  Service: Cardiovascular;  Laterality: N/A;   SHOULDER ARTHROSCOPY WITH ROTATOR CUFF REPAIR AND SUBACROMIAL DECOMPRESSION Right 07/15/2012   Procedure: RIGHT ARTHROSCOPY SHOULDER DECOMPRESSION  SUBACROMIAL PARTIAL ACROMIOPLASTY WITH CORACOACROMIAL RELEASE,  DISTAL CLAVICULECTOMY, resect biceps debride labrium WITH ROTATOR CUFF REPAIR;  Surgeon: [name], MD;  Location: [facility];  Service: Orthopedics;  Laterality: Right;    Current Outpatient Medications  Medication Sig Dispense Refill   acetaminophen (TYLENOL) 325 MG tablet Take 325 mg by mouth every 6 (six) hours as needed (for pain.).      ASPIRIN LOW DOSE 81 MG chewable tablet Chew 81 mg by mouth every evening.   0   ELIQUIS 5 MG TABS tablet TAKE 1 TABLET(5 MG) BY MOUTH TWICE DAILY (Patient taking differently: Take 5 mg by mouth 2 (two) times daily. ) 60 tablet 5   nitroGLYCERIN (NITROSTAT) 0.4 MG SL tablet Place 1 tablet (0.4 mg total) under the tongue every 5 (five) minutes as needed. 25 tablet 2   zaleplon (SONATA) 5 MG capsule Take 5 mg by mouth at bedtime as needed for sleep.     amiodarone (PACERONE) 200 MG tablet Take 2 tablets (400 mg total) TWICE daily for 2 weeks, then take 1 tablet (200 mg total) TWICE daily for 2 weeks, then take 1 tablet (200 mg total) ONCE daily 84 tablet 0   amiodarone (PACERONE) 200 MG tablet Take 1 tablet (200 mg total) by mouth daily. 90 tablet 1   atorvastatin (LIPITOR) 40 MG tablet Take 1 tablet (40 mg total) by mouth daily. (Patient taking differently: Take 40 mg by mouth at bedtime. ) 90 tablet 3   metoprolol tartrate (LOPRESSOR) 50 MG tablet Take 1 tablet (50 mg total) by mouth 2 (two) times daily. 180 tablet 3   No current facility-administered medications for this visit.      Allergies:   Lisinopril   Social History:  The patient  reports that he quit smoking about 45 years ago. He has never used smokeless tobacco. He reports current alcohol use of about 2.0 standard drinks of alcohol per week. He reports that he does not use drugs.   Family History:  The patient's  family history includes Cancer in his sister; Clotting disorder in his mother; Heart disease in his brother; Heart failure in his father; Hypertension in his sister; Other in his mother.   ROS:  Please see the history of present illness.   All other systems are personally reviewed and negative.    Exam:    Vital Signs:  BP (!) 136/97    Pulse 82    Wt 263 lb 8 oz (119.5 kg)    BMI 36.75 kg/m   Well appearing, alert and conversant, regular work of breathing,  good skin color Eyes- anicteric, neuro- grossly intact, skin- no apparent rash or lesions or cyanosis, mouth- oral mucosa is pink   Labs/Other Tests and Data Reviewed:  Recent Labs: 04/09/2018: ALT 26 05/22/2018: B Natriuretic Peptide 83.0 06/24/2018: BUN 11; Creatinine, Ser 0.98; Hemoglobin 14.6; Platelets 167; Potassium 4.3; Sodium 136   Wt Readings from Last 3 Encounters:  08/24/18 263 lb 8 oz (119.5 kg)  07/09/18 266 lb (120.7 kg)  06/30/18 262 lb (118.8 kg)     Other studies personally reviewed: Additional studies/ records that were reviewed today include: ECG 07/09/2018 Review of the above records today demonstrates:   Sinus rhythm, LVH  TTE 05/22/2018 - Procedure narrative: Transthoracic echocardiography. Image   quality was poor. The study was technically difficult, as a   result of poor acoustic windows. Intravenous contrast (Definity)   was administered. - Left ventricle: The cavity size was normal. Wall thickness was   increased in a pattern of mild LVH. Systolic function was normal.   The estimated ejection fraction was in the range of 60% to 65%.   Wall motion was normal; there were no regional wall motion    abnormalities. Doppler parameters are consistent with restrictive   physiology, indicative of decreased left ventricular diastolic   compliance and/or increased left atrial pressure. Doppler   parameters are consistent with high ventricular filling pressure. - Aortic valve: Mildly calcified annulus. Trileaflet. There was   mild regurgitation. - Mitral valve: Moderately calcified annulus. Normal thickness   leaflets . There was mild regurgitation. - Tricuspid valve: There was mild regurgitation. - Systemic veins: IVC poorly visualized but appears dilated.    ASSESSMENT & PLAN:    1.  Persistent atrial fibrillation: Currently on Eliquis.  Is now status post ablation 05/20/2018.  Unfortunately he has gone back into atrial fibrillation 2 times.  His atrial fibrillation is certainly persistent.  I talked to him about further management options including medications versus repeat ablation.   Risks include bleeding, tamponade, heart block, stroke, damage to surrounding organs. At this point, he would prefer to have a repeat ablation performed, though this [place] be likely in a few months.  Due to that, we Waunita Sandstrom plan for initiation of amiodarone today.  I Makelle Marrone have him follow-up in the A. fib clinic in 2 weeks.  If he remains in atrial fibrillation, Shavaun Osterloh likely plan for cardioversion.  This patients CHA2DS2-VASc Score and unadjusted Ischemic Stroke Rate (% per year) is equal to 3.2 % stroke rate/year from a score of 3  Above score calculated as 1 point each if present [CHF, HTN, DM, Vascular=MI/PAD/Aortic Plaque, Age if 65-74, or Male] Above score calculated as 2 points each if present [Age > 75, or Stroke/TIA/TE]   2.  Hypertension: Mildly elevated today but is usually much better controlled.  He feels that it is better controlled when he is in normal rhythm.  No changes.  3.  OSA: CPAP compliance encouraged  4.  Morbid obesity: Diet and exercise encouraged  5.  Coronary artery disease: Found  on pre-ablation CT.  Status post stenting of the circumflex and RCA.   COVID 19 screen The patient denies symptoms of COVID 19 at this time.  The importance of social distancing was discussed today.  Follow-up: 3 months   Current medicines are reviewed at length with the patient today.   The patient does not have concerns regarding his medicines.  The following changes were made today:  none  Labs/ tests ordered today include:  No orders of the defined types were placed in this encounter.    Patient Risk:  after full review of this patients clinical status, I feel that they  are at moderate risk at this time.  Today, I have spent 15 minutes with the patient with telehealth technology discussing atrial fibrillation.    Signed, Joyceann Kruser Jorja LoaMartin Avi Archuleta, MD  08/24/2018 9:28 AM     [facility] [address] [place] [address] [phone] (office) [phone] (fax)

## 2018-08-24 NOTE — Progress Notes (Signed)
Electrophysiology TeleHealth Note   Due to national recommendations of social distancing due to COVID 19, an audio/video telehealth visit is felt to be most appropriate for this patient at this time.  See Epic message for the patient's consent to telehealth for [facility]   Date:  08/24/2018   ID:  Ronald Hull, DOB [birth year], MRN [id number]  Location: patient's home  Provider location: [address] [place]  Evaluation Performed: Follow-up visit  PCP:  Renford Dills, MD  Cardiologist:  Tonny Bollman, MD  Electrophysiologist:  Dr Elberta Fortis  Chief Complaint:  Atrial fibrillation  History of Present Illness:    Ronald Hull is a 73 y.o. male who presents via audio/video conferencing for a telehealth visit today.  Since last being seen in our clinic, the patient reports doing very well.  Today, he denies symptoms of palpitations, chest pain, shortness of breath,  lower extremity edema, dizziness, presyncope, or syncope.  The patient is otherwise without complaint today.  The patient denies symptoms of fevers, chills, cough, or new SOB worrisome for COVID 19.  He has a history of atrial fibrillation.  He is status post ablation 05/20/2018.  Today, denies symptoms of chest pain, shortness of breath, orthopnea, PND, lower extremity edema, claudication, dizziness, presyncope, syncope, bleeding, or neurologic sequela. The patient is tolerating medications without difficulties.  Unfortunately, he has gone into atrial fibrillation 2 times since his ablation.  The first time, he was on a trip to [place].  He traveled all day, walked 11,000 steps and was up most of the night.  He went into atrial fibrillation.  Went to the emergency room there and may control his rate.  He came back to [facility] and had a cardioversion.  He went back in atrial fibrillation the beginning of April.  He said that he missed 1 dose of metoprolol and Eliquis.  Since then he has had weakness and  fatigue.  Past Medical History:  Diagnosis Date   Arthritis    Bronchitis 05/23/2018   CAD (coronary artery disease) 02/24/2018   LHC 10/19: pLAD 50, mLAD 50 (dFFR=0.89), oLCx 95, pRCA 30, mRCA 80 >> PCI: DES to LCx; DES to RCA // Xience28 Trial participant (Karene Bracken DC Plavix after 30 d and remain on ASA + Apixaban)   Chest pain secondary to ablation, with expected mild elevation in troponin 05/22/2018   Complication of anesthesia    BP dropped post colonoscopy   Hypercholesteremia    Hypertension    Persistent atrial fibrillation    Sleep apnea    does use a cpap   Wears glasses     Past Surgical History:  Procedure Laterality Date   ATRIAL FIBRILLATION ABLATION N/A 05/20/2018   Procedure: ATRIAL FIBRILLATION ABLATION;  Surgeon: Regan Lemming, MD;  Location: [facility] INVASIVE CV LAB;  Service: Cardiovascular;  Laterality: N/A;   CARDIOVERSION N/A 11/01/2015   Procedure: CARDIOVERSION;  Surgeon: [name], MD;  Location: [facility] ENDOSCOPY;  Service: Cardiovascular;  Laterality: N/A;   CARDIOVERSION N/A 11/23/2015   Procedure: CARDIOVERSION;  Surgeon: Vienne Corcoran [name], MD;  Location: [facility] ENDOSCOPY;  Service: Cardiovascular;  Laterality: N/A;   CARDIOVERSION N/A 10/12/2017   Procedure: CARDIOVERSION;  Surgeon: [name], MD;  Location: [facility] ENDOSCOPY;  Service: Cardiovascular;  Laterality: N/A;   CARDIOVERSION N/A 06/30/2018   Procedure: CARDIOVERSION;  Surgeon: Jake Bathe, MD;  Location: [facility] ENDOSCOPY;  Service: Cardiovascular;  Laterality: N/A;   COLONOSCOPY     CORONARY  STENT INTERVENTION N/A 02/24/2018   Procedure: CORONARY STENT INTERVENTION;  Surgeon: Tonny Bollman, MD;  Location: [facility] INVASIVE CV LAB;  Service: Cardiovascular;  Laterality: N/A;   INTRAVASCULAR PRESSURE WIRE/FFR STUDY N/A 02/24/2018   Procedure: INTRAVASCULAR PRESSURE WIRE/FFR STUDY;  Surgeon: Tonny Bollman, MD;  Location: [facility] INVASIVE CV LAB;  Service: Cardiovascular;  Laterality: N/A;    KNEE ARTHROSCOPY  2000   LEFT HEART CATH AND CORONARY ANGIOGRAPHY N/A 02/24/2018   Procedure: LEFT HEART CATH AND CORONARY ANGIOGRAPHY;  Surgeon: Tonny Bollman, MD;  Location: [facility] INVASIVE CV LAB;  Service: Cardiovascular;  Laterality: N/A;   SHOULDER ARTHROSCOPY WITH ROTATOR CUFF REPAIR AND SUBACROMIAL DECOMPRESSION Right 07/15/2012   Procedure: RIGHT ARTHROSCOPY SHOULDER DECOMPRESSION  SUBACROMIAL PARTIAL ACROMIOPLASTY WITH CORACOACROMIAL RELEASE,  DISTAL CLAVICULECTOMY, resect biceps debride labrium WITH ROTATOR CUFF REPAIR;  Surgeon: [name], MD;  Location: [facility];  Service: Orthopedics;  Laterality: Right;    Current Outpatient Medications  Medication Sig Dispense Refill   acetaminophen (TYLENOL) 325 MG tablet Take 325 mg by mouth every 6 (six) hours as needed (for pain.).      ASPIRIN LOW DOSE 81 MG chewable tablet Chew 81 mg by mouth every evening.   0   ELIQUIS 5 MG TABS tablet TAKE 1 TABLET(5 MG) BY MOUTH TWICE DAILY (Patient taking differently: Take 5 mg by mouth 2 (two) times daily. ) 60 tablet 5   nitroGLYCERIN (NITROSTAT) 0.4 MG SL tablet Place 1 tablet (0.4 mg total) under the tongue every 5 (five) minutes as needed. 25 tablet 2   zaleplon (SONATA) 5 MG capsule Take 5 mg by mouth at bedtime as needed for sleep.     amiodarone (PACERONE) 200 MG tablet Take 2 tablets (400 mg total) TWICE daily for 2 weeks, then take 1 tablet (200 mg total) TWICE daily for 2 weeks, then take 1 tablet (200 mg total) ONCE daily 84 tablet 0   amiodarone (PACERONE) 200 MG tablet Take 1 tablet (200 mg total) by mouth daily. 90 tablet 1   atorvastatin (LIPITOR) 40 MG tablet Take 1 tablet (40 mg total) by mouth daily. (Patient taking differently: Take 40 mg by mouth at bedtime. ) 90 tablet 3   metoprolol tartrate (LOPRESSOR) 50 MG tablet Take 1 tablet (50 mg total) by mouth 2 (two) times daily. 180 tablet 3   No current facility-administered medications for this visit.      Allergies:   Lisinopril   Social History:  The patient  reports that he quit smoking about 45 years ago. He has never used smokeless tobacco. He reports current alcohol use of about 2.0 standard drinks of alcohol per week. He reports that he does not use drugs.   Family History:  The patient's  family history includes Cancer in his sister; Clotting disorder in his mother; Heart disease in his brother; Heart failure in his father; Hypertension in his sister; Other in his mother.   ROS:  Please see the history of present illness.   All other systems are personally reviewed and negative.    Exam:    Vital Signs:  BP (!) 136/97    Pulse 82    Wt 263 lb 8 oz (119.5 kg)    BMI 36.75 kg/m   Well appearing, alert and conversant, regular work of breathing,  good skin color Eyes- anicteric, neuro- grossly intact, skin- no apparent rash or lesions or cyanosis, mouth- oral mucosa is pink   Labs/Other Tests and Data Reviewed:  Recent Labs: °04/09/2018: ALT 26 °05/22/2018: B Natriuretic Peptide 83.0 °06/24/2018: BUN 11; Creatinine, Ser 0.98; Hemoglobin 14.6; Platelets 167; Potassium 4.3; Sodium 136  ° °Wt Readings from Last 3 Encounters:  °08/24/18 263 lb 8 oz (119.5 kg)  °07/09/18 266 lb (120.7 kg)  °06/30/18 262 lb (118.8 kg)  °  ° °Other studies personally reviewed: °Additional studies/ records that were reviewed today include: ECG 07/09/2018 °Review of the above records today demonstrates:   °Sinus rhythm, LVH ° °TTE 05/22/2018 °- Procedure narrative: Transthoracic echocardiography. Image °  quality was poor. The study was technically difficult, as a °  result of poor acoustic windows. Intravenous contrast (Definity) °  was administered. °- Left ventricle: The cavity size was normal. Wall thickness was °  increased in a pattern of mild LVH. Systolic function was normal. °  The estimated ejection fraction was in the range of 60% to 65%. °  Wall motion was normal; there were no regional wall motion °   abnormalities. Doppler parameters are consistent with restrictive °  physiology, indicative of decreased left ventricular diastolic °  compliance and/or increased left atrial pressure. Doppler °  parameters are consistent with high ventricular filling pressure. °- Aortic valve: Mildly calcified annulus. Trileaflet. There was °  mild regurgitation. °- Mitral valve: Moderately calcified annulus. Normal thickness °  leaflets . There was mild regurgitation. °- Tricuspid valve: There was mild regurgitation. °- Systemic veins: IVC poorly visualized but appears dilated. °  ° ° °ASSESSMENT & PLAN:   ° °1.  Persistent atrial fibrillation: Currently on Eliquis.  Is now status post ablation 05/20/2018.  Unfortunately he has gone back into atrial fibrillation 2 times.  His atrial fibrillation is certainly persistent.  I talked to him about further management options including medications versus repeat ablation.   Risks include bleeding, tamponade, heart block, stroke, damage to surrounding organs. At this point, he would prefer to have a repeat ablation performed, though this Sanjay Broadfoot be likely in a few months.  Due to that, we Tomoki Lucken plan for initiation of amiodarone today.  I Atalia Litzinger have him follow-up in the A. fib clinic in 2 weeks.  If he remains in atrial fibrillation, Avalon Coppinger likely plan for cardioversion. ° °This patients CHA2DS2-VASc Score and unadjusted Ischemic Stroke Rate (% per year) is equal to °3.2 % stroke rate/year from a score of 3 ° °Above score calculated as 1 point each if present [CHF, HTN, DM, Vascular=MI/PAD/Aortic Plaque, Age if 65-74, or Male] °Above score calculated as 2 points each if present [Age > 75, or Stroke/TIA/TE] ° ° °2.  Hypertension: Mildly elevated today but is usually much better controlled.  He feels that it is better controlled when he is in normal rhythm.  No changes. ° °3.  OSA: CPAP compliance encouraged ° °4.  Morbid obesity: Diet and exercise encouraged ° °5.  Coronary artery disease: Found  on pre-ablation CT.  Status post stenting of the circumflex and RCA. ° ° °COVID 19 screen °The patient denies symptoms of COVID 19 at this time.  The importance of social distancing was discussed today. ° °Follow-up: 3 months ° ° °Current medicines are reviewed at length with the patient today.   °The patient does not have concerns regarding his medicines.  The following changes were made today:  none ° °Labs/ tests ordered today include:  °No orders of the defined types were placed in this encounter. ° ° ° °Patient Risk:  after full review of this patients clinical status, I feel that they   are at moderate risk at this time.  Today, I have spent 15 minutes with the patient with telehealth technology discussing atrial fibrillation.    Signed, Mattie Nordell Jorja LoaMartin Sirron Francesconi, MD  08/24/2018 9:28 AM     [facility] [address] [place] [address] [phone] (office) [phone] (fax)

## 2018-08-26 ENCOUNTER — Encounter (HOSPITAL_COMMUNITY): Payer: Self-pay

## 2018-09-16 ENCOUNTER — Encounter (HOSPITAL_COMMUNITY): Payer: Self-pay | Admitting: Physician Assistant

## 2018-09-16 ENCOUNTER — Other Ambulatory Visit (HOSPITAL_COMMUNITY): Payer: Self-pay | Admitting: *Deleted

## 2018-09-16 ENCOUNTER — Other Ambulatory Visit: Payer: Self-pay

## 2018-09-16 ENCOUNTER — Ambulatory Visit (HOSPITAL_COMMUNITY)
Admission: RE | Admit: 2018-09-16 | Discharge: 2018-09-16 | Disposition: A | Discharge status: Home / Self Care | Payer: Medicare Other | Source: Ambulatory Visit | Attending: Physician Assistant | Admitting: Physician Assistant

## 2018-09-16 VITALS — BP 132/76 | HR 76 | Ht 71.0 in | Wt 272.0 lb

## 2018-09-16 DIAGNOSIS — G4733 Obstructive sleep apnea (adult) (pediatric): Secondary | ICD-10-CM | POA: Diagnosis not present

## 2018-09-16 DIAGNOSIS — Z8249 Family history of ischemic heart disease and other diseases of the circulatory system: Secondary | ICD-10-CM | POA: Insufficient documentation

## 2018-09-16 DIAGNOSIS — I4819 Other persistent atrial fibrillation: Secondary | ICD-10-CM | POA: Insufficient documentation

## 2018-09-16 DIAGNOSIS — Z6837 Body mass index (BMI) 37.0-37.9, adult: Secondary | ICD-10-CM | POA: Insufficient documentation

## 2018-09-16 DIAGNOSIS — Z87891 Personal history of nicotine dependence: Secondary | ICD-10-CM | POA: Insufficient documentation

## 2018-09-16 DIAGNOSIS — Z7982 Long term (current) use of aspirin: Secondary | ICD-10-CM | POA: Diagnosis not present

## 2018-09-16 DIAGNOSIS — Z79899 Other long term (current) drug therapy: Secondary | ICD-10-CM | POA: Diagnosis not present

## 2018-09-16 DIAGNOSIS — M1990 Unspecified osteoarthritis, unspecified site: Secondary | ICD-10-CM | POA: Diagnosis not present

## 2018-09-16 DIAGNOSIS — E7800 Pure hypercholesterolemia, unspecified: Secondary | ICD-10-CM | POA: Insufficient documentation

## 2018-09-16 DIAGNOSIS — Z7901 Long term (current) use of anticoagulants: Secondary | ICD-10-CM | POA: Insufficient documentation

## 2018-09-16 DIAGNOSIS — Z809 Family history of malignant neoplasm, unspecified: Secondary | ICD-10-CM | POA: Diagnosis not present

## 2018-09-16 DIAGNOSIS — I2510 Atherosclerotic heart disease of native coronary artery without angina pectoris: Secondary | ICD-10-CM | POA: Diagnosis not present

## 2018-09-16 DIAGNOSIS — Z888 Allergy status to other drugs, medicaments and biological substances status: Secondary | ICD-10-CM | POA: Diagnosis not present

## 2018-09-16 DIAGNOSIS — I10 Essential (primary) hypertension: Secondary | ICD-10-CM | POA: Diagnosis not present

## 2018-09-16 DIAGNOSIS — E669 Obesity, unspecified: Secondary | ICD-10-CM | POA: Diagnosis not present

## 2018-09-16 LAB — CBC WITH DIFFERENTIAL/PLATELET
Abs Immature Granulocytes: 0.03 10*3/uL (ref 0.00–0.07)
Basophils Absolute: 0 10*3/uL (ref 0.0–0.1)
Basophils Relative: 0 %
Eosinophils Absolute: 0.1 10*3/uL (ref 0.0–0.5)
Eosinophils Relative: 1 %
HCT: 45.4 % (ref 39.0–52.0)
Hemoglobin: 14.8 g/dL (ref 13.0–17.0)
Immature Granulocytes: 0 %
Lymphocytes Relative: 21 %
Lymphs Abs: 1.5 10*3/uL (ref 0.7–4.0)
MCH: 30.8 pg (ref 26.0–34.0)
MCHC: 32.6 g/dL (ref 30.0–36.0)
MCV: 94.6 fL (ref 80.0–100.0)
Monocytes Absolute: 0.8 10*3/uL (ref 0.1–1.0)
Monocytes Relative: 11 %
Neutro Abs: 4.7 10*3/uL (ref 1.7–7.7)
Neutrophils Relative %: 67 %
Platelets: 171 10*3/uL (ref 150–400)
RBC: 4.8 MIL/uL (ref 4.22–5.81)
RDW: 13.9 % (ref 11.5–15.5)
WBC: 7 10*3/uL (ref 4.0–10.5)
nRBC: 0 % (ref 0.0–0.2)

## 2018-09-16 LAB — COMPREHENSIVE METABOLIC PANEL
ALT: 25 U/L (ref 0–44)
AST: 19 U/L (ref 15–41)
Albumin: 3.6 g/dL (ref 3.5–5.0)
Alkaline Phosphatase: 73 U/L (ref 38–126)
Anion gap: 7 (ref 5–15)
BUN: 15 mg/dL (ref 8–23)
CO2: 25 mmol/L (ref 22–32)
Calcium: 8.9 mg/dL (ref 8.9–10.3)
Chloride: 105 mmol/L (ref 98–111)
Creatinine, Ser: 1.17 mg/dL (ref 0.61–1.24)
GFR calc Af Amer: >60 mL/min (ref >60)
GFR calc non Af Amer: >60 mL/min (ref >60)
Glucose, Bld: 92 mg/dL (ref 70–99)
Potassium: 4.7 mmol/L (ref 3.5–5.1)
Sodium: 137 mmol/L (ref 135–145)
Total Bilirubin: 0.6 mg/dL (ref 0.3–1.2)
Total Protein: 6.5 g/dL (ref 6.5–8.1)

## 2018-09-16 LAB — TSH: TSH: 4.382 u[IU]/mL (ref 0.350–4.500)

## 2018-09-16 NOTE — Patient Instructions (Addendum)
Your cardioversion is scheduled for : Wednesday Sep 22, 2018 at 11:15 Arrive at the [facility] and go to admitting at 9:45 a.m. Do Not eat or drink anything after midnight the night prior to your procedure. Take all your medications with a sip of water prior to arrival. Do NOT miss any doses of your blood thinner. You will NOT be able to drive home after your procedure.   You will need to be covid tested 3 day Prior to your procedure.  This has been scheduled for you.  You will need to self Isolate for 3 days after this test.  So please be prepared to do so.    We will see back in one week post dccv.  This has also been scheduled  If you need to move any appts or have any questions please call us at the number listed at the top of this page.

## 2018-09-16 NOTE — Progress Notes (Signed)
Primary Care Physician: Renford Dills, MD Referring Physician: Dr. Elberta Fortis Cardiologist: Dr. Madolyn Frieze Ronald Hull is a 73 y.o. male with a h/o persistent afib, CAD, that is in the afib clinic for f/u. Patient reports that he felt well immediately after his afib ablation with much more energy and increased exercise tolerance at cardiac rehab. Unfortunately, he has gone into atrial fibrillation 2 times since his ablation.  The first time, he was on a trip to [place].  He traveled all day, walked 11,000 steps and was up most of the night.  He went into atrial fibrillation.  Went to the emergency room there and may control his rate.  He came back to [facility] and had a cardioversion.  He went back in atrial fibrillation the beginning of April.  He said that he missed 1 dose of metoprolol and Eliquis. He appeared to be in persistent afib at his visit with Dr Elberta Fortis and patient was started on amiodarone.   On follow up today he remains in rate controlled afib. He reports that he continues to have SOB and fatigue even with the addition of amiodarone. He denies any missed doses of anticoagulation.  Today, he denies symptoms of palpitations, chest pain, orthopnea, PND, lower extremity edema, dizziness, presyncope, syncope, or neurologic sequela. The patient is tolerating medications without difficulties and is otherwise without complaint today. +SOB, fatigue. No symptoms concerning for COVID-19.  Past Medical History:  Diagnosis Date  . Arthritis   . Bronchitis 05/23/2018  . CAD (coronary artery disease) 02/24/2018   LHC 10/19: pLAD 50, mLAD 50 (dFFR=0.89), oLCx 95, pRCA 30, mRCA 80 >> PCI: DES to LCx; DES to RCA // Xience28 Trial participant (will DC Plavix after 30 d and remain on ASA + Apixaban)  . Chest pain secondary to ablation, with expected mild elevation in troponin 05/22/2018  . Complication of anesthesia    BP dropped post colonoscopy  . Hypercholesteremia   . Hypertension   .  Persistent atrial fibrillation   . Sleep apnea    does use a cpap  . Wears glasses    Past Surgical History:  Procedure Laterality Date  . ATRIAL FIBRILLATION ABLATION N/A 05/20/2018   Procedure: ATRIAL FIBRILLATION ABLATION;  Surgeon: Regan Lemming, MD;  Location: [facility] INVASIVE CV LAB;  Service: Cardiovascular;  Laterality: N/A;  . CARDIOVERSION N/A 11/01/2015   Procedure: CARDIOVERSION;  Surgeon: [name], MD;  Location: [facility] ENDOSCOPY;  Service: Cardiovascular;  Laterality: N/A;  . CARDIOVERSION N/A 11/23/2015   Procedure: CARDIOVERSION;  Surgeon: Will [name], MD;  Location: [facility] ENDOSCOPY;  Service: Cardiovascular;  Laterality: N/A;  . CARDIOVERSION N/A 10/12/2017   Procedure: CARDIOVERSION;  Surgeon: [name], MD;  Location: [facility] ENDOSCOPY;  Service: Cardiovascular;  Laterality: N/A;  . CARDIOVERSION N/A 06/30/2018   Procedure: CARDIOVERSION;  Surgeon: Jake Bathe, MD;  Location: [facility] ENDOSCOPY;  Service: Cardiovascular;  Laterality: N/A;  . COLONOSCOPY    . CORONARY STENT INTERVENTION N/A 02/24/2018   Procedure: CORONARY STENT INTERVENTION;  Surgeon: Tonny Bollman, MD;  Location: [facility] INVASIVE CV LAB;  Service: Cardiovascular;  Laterality: N/A;  . INTRAVASCULAR PRESSURE WIRE/FFR STUDY N/A 02/24/2018   Procedure: INTRAVASCULAR PRESSURE WIRE/FFR STUDY;  Surgeon: Tonny Bollman, MD;  Location: [facility] INVASIVE CV LAB;  Service: Cardiovascular;  Laterality: N/A;  . KNEE ARTHROSCOPY  2000  . LEFT HEART CATH AND CORONARY ANGIOGRAPHY N/A 02/24/2018   Procedure: LEFT HEART CATH AND CORONARY ANGIOGRAPHY;  Surgeon: Tonny Bollman, MD;  Location: [facility] INVASIVE CV LAB;  Service: Cardiovascular;  Laterality: N/A;  . SHOULDER ARTHROSCOPY WITH ROTATOR CUFF REPAIR AND SUBACROMIAL DECOMPRESSION Right 07/15/2012   Procedure: RIGHT ARTHROSCOPY SHOULDER DECOMPRESSION  SUBACROMIAL PARTIAL ACROMIOPLASTY WITH CORACOACROMIAL RELEASE,  DISTAL CLAVICULECTOMY, resect biceps debride labrium WITH  ROTATOR CUFF REPAIR;  Surgeon: [name], MD;  Location: [facility];  Service: Orthopedics;  Laterality: Right;    Current Outpatient Medications  Medication Sig Dispense Refill  . acetaminophen (TYLENOL) 325 MG tablet Take 325 mg by mouth every 6 (six) hours as needed (for pain.).     Marland Kitchen amiodarone (PACERONE) 200 MG tablet Take 2 tablets (400 mg total) TWICE daily for 2 weeks, then take 1 tablet (200 mg total) TWICE daily for 2 weeks, then take 1 tablet (200 mg total) ONCE daily 84 tablet 0  . ASPIRIN LOW DOSE 81 MG chewable tablet Chew 81 mg by mouth every evening.   0  . atorvastatin (LIPITOR) 40 MG tablet Take 1 tablet (40 mg total) by mouth daily. 90 tablet 3  . ELIQUIS 5 MG TABS tablet TAKE 1 TABLET(5 MG) BY MOUTH TWICE DAILY 60 tablet 5  . zaleplon (SONATA) 5 MG capsule Take 5 mg by mouth at bedtime as needed for sleep.    Marland Kitchen amiodarone (PACERONE) 200 MG tablet Take 1 tablet (200 mg total) by mouth daily. (Patient not taking: Reported on 09/16/2018) 90 tablet 1  . metoprolol tartrate (LOPRESSOR) 50 MG tablet Take 1 tablet (50 mg total) by mouth 2 (two) times daily. 180 tablet 3  . nitroGLYCERIN (NITROSTAT) 0.4 MG SL tablet Place 1 tablet (0.4 mg total) under the tongue every 5 (five) minutes as needed. (Patient not taking: Reported on 09/16/2018) 25 tablet 2   No current facility-administered medications for this encounter.     Allergies  Allergen Reactions  . Lisinopril Cough    Social History   Socioeconomic History  . Marital status: Married    Spouse name: Not on file  . Number of children: Not on file  . Years of education: Not on file  . Highest education level: Not on file  Occupational History  . Not on file  Social Needs  . Financial resource strain: Not very hard  . Food insecurity:    Worry: Never true    Inability: Never true  . Transportation needs:    Medical: No    Non-medical: No  Tobacco Use  . Smoking status: Former Smoker    Last  attempt to quit: 07/09/1973    Years since quitting: 45.2  . Smokeless tobacco: Never Used  Substance and Sexual Activity  . Alcohol use: Yes    Alcohol/week: 2.0 standard drinks    Types: 2 Glasses of wine per week  . Drug use: No  . Sexual activity: Not Currently    Birth control/protection: Abstinence  Lifestyle  . Physical activity:    Days per week: Not on file    Minutes per session: Not on file  . Stress: Not on file  Relationships  . Social connections:    Talks on phone: Not on file    Gets together: Not on file    Attends religious service: Not on file    Active member of club or organization: Not on file    Attends meetings of clubs or organizations: Not on file    Relationship status: Not on file  . Intimate partner violence:    Fear of current or ex partner: No  Emotionally abused: No    Physically abused: No    Forced sexual activity: No  Other Topics Concern  . Not on file  Social History Narrative  . Not on file    Family History  Problem Relation Age of Onset  . Heart failure Father   . Other Mother        bad fall  . Clotting disorder Mother   . Cancer Sister   . Hypertension Sister   . Heart disease Brother     ROS- All systems are reviewed and negative except as per the HPI above  Physical Exam: Vitals:   09/16/18 0903  BP: 132/76  Pulse: 76  Weight: 123.4 kg  Height: 5\' 11"  (1.803 m)   Wt Readings from Last 3 Encounters:  09/16/18 123.4 kg  08/24/18 119.5 kg  07/09/18 120.7 kg    Labs: Lab Results  Component Value Date   NA 136 06/24/2018   K 4.3 06/24/2018   CL 106 06/24/2018   CO2 26 06/24/2018   GLUCOSE 101 (H) 06/24/2018   BUN 11 06/24/2018   CREATININE 0.98 06/24/2018   CALCIUM 8.8 (L) 06/24/2018   No results found for: INR Lab Results  Component Value Date   CHOL 96 (L) 04/09/2018   HDL 31 (L) 04/09/2018   LDLCALC 50 04/09/2018   TRIG 77 04/09/2018    GEN- The patient is well appearing obese male, alert and  oriented x 3 today.   HEENT-head normocephalic, atraumatic, sclera clear, conjunctiva pink, hearing intact, trachea midline. Lungs- Clear to ausculation bilaterally, normal work of breathing Heart- irregular rate and rhythm, no murmurs, rubs or gallops  GI- soft, NT, ND, + BS Extremities- no clubbing, cyanosis, or edema MS- no significant deformity or atrophy Skin- no rash or lesion Psych- euthymic mood, full affect Neuro- strength and sensation are intact    EKG-  Afib HR 76, QRS 98, QTc 407  Echo 05/22/18 demonstrates - Procedure narrative: Transthoracic echocardiography. Image   quality was poor. The study was technically difficult, as a   result of poor acoustic windows. Intravenous contrast (Definity)   was administered. - Left ventricle: The cavity size was normal. Wall thickness was increased in a pattern of mild LVH. Systolic function was normal.   The estimated ejection fraction was in the range of 60% to 65%.   Wall motion was normal; there were no regional wall motion   abnormalities. Doppler parameters are consistent with restrictive   physiology, indicative of decreased left ventricular diastolic   compliance and/or increased left atrial pressure. Doppler   parameters are consistent with high ventricular filling pressure. - Aortic valve: Mildly calcified annulus. Trileaflet. There was   mild regurgitation. - Mitral valve: Moderately calcified annulus. Normal thickness   leaflets . There was mild regurgitation. - Tricuspid valve: There was mild regurgitation. - Systemic veins: IVC poorly visualized but appears dilated.  Assessment and Plan: 1. Persistent atrial fibrillation S/p ablation 05/20/18 with Dr Elberta Fortis. Started on amiodarone at his last visit with Dr Elberta Fortis. Pt remains in symptomatic rate controlled afib. Will arrange for DCCV. Continue amiodarone 200 mg BID, then 200 mg daily starting next week. Continue Eliquis 5 mg BID Patient reports no missed doses  of anticoagulation. Check CMP/CBC/TSH  This patients CHA2DS2-VASc Score and unadjusted Ischemic Stroke Rate (% per year) is equal to 3.2 % stroke rate/year from a score of 3  Above score calculated as 1 point each if present [CHF, HTN, DM, Vascular=MI/PAD/Aortic Plaque, Age if  65-74, or Male] Above score calculated as 2 points each if present [Age > 75, or Stroke/TIA/TE]  2. CAD S/p DES to RCA and LCx. No anginal symptoms. Continue present therapy.  3. HTN  Stable, no changes today.  4. OSA Encouraged compliance with CPAP therapy.  5. Obesity Body mass index is 37.94 kg/m. Lifestyle modification was discussed and encouraged including regular physical activity and weight reduction. Pt reports he tries to get 5,000 steps per day on his smart watch.   Follow up in AF clinic one week post DCCV. Dr Elberta Fortis in 2 months.   [name] PA-C Afib Clinic [facility] [address] [place], [place] [address] [phone]

## 2018-09-17 ENCOUNTER — Other Ambulatory Visit (HOSPITAL_COMMUNITY)
Admission: RE | Admit: 2018-09-17 | Discharge: 2018-09-17 | Disposition: A | Discharge status: Home / Self Care | Payer: Medicare Other | Source: Ambulatory Visit | Attending: Cardiology | Admitting: Cardiology

## 2018-09-17 DIAGNOSIS — Z1159 Encounter for screening for other viral diseases: Secondary | ICD-10-CM | POA: Insufficient documentation

## 2018-09-18 LAB — NOVEL CORONAVIRUS, NAA (HOSP ORDER, SEND-OUT TO REF LAB; TAT 18-24 HRS): SARS-CoV-2, NAA: NOT DETECTED

## 2018-09-21 ENCOUNTER — Encounter (HOSPITAL_COMMUNITY): Payer: Self-pay | Admitting: *Deleted

## 2018-09-21 NOTE — Progress Notes (Signed)
Spoke with patient regarding quarantine prior to surgery. Patient was successful staying at home and isolating. Confirmed time for arrival and reminded no visitors will be in facility at this time.

## 2018-09-22 ENCOUNTER — Encounter (HOSPITAL_COMMUNITY): Payer: Self-pay | Admitting: *Deleted

## 2018-09-22 ENCOUNTER — Ambulatory Visit (HOSPITAL_COMMUNITY): Payer: Medicare Other | Admitting: Certified Registered"

## 2018-09-22 ENCOUNTER — Encounter (HOSPITAL_COMMUNITY)
Admission: RE | Disposition: A | Discharge status: Home / Self Care | Payer: Self-pay | Source: Home / Self Care | Attending: Cardiology

## 2018-09-22 ENCOUNTER — Ambulatory Visit (HOSPITAL_COMMUNITY)
Admission: RE | Admit: 2018-09-22 | Discharge: 2018-09-22 | Disposition: A | Discharge status: Home / Self Care | Payer: Medicare Other | Attending: Cardiology | Admitting: Cardiology

## 2018-09-22 DIAGNOSIS — E119 Type 2 diabetes mellitus without complications: Secondary | ICD-10-CM | POA: Insufficient documentation

## 2018-09-22 DIAGNOSIS — Z7901 Long term (current) use of anticoagulants: Secondary | ICD-10-CM | POA: Diagnosis not present

## 2018-09-22 DIAGNOSIS — Z6836 Body mass index (BMI) 36.0-36.9, adult: Secondary | ICD-10-CM | POA: Diagnosis not present

## 2018-09-22 DIAGNOSIS — Z8249 Family history of ischemic heart disease and other diseases of the circulatory system: Secondary | ICD-10-CM | POA: Diagnosis not present

## 2018-09-22 DIAGNOSIS — I4819 Other persistent atrial fibrillation: Secondary | ICD-10-CM | POA: Diagnosis not present

## 2018-09-22 DIAGNOSIS — Z87891 Personal history of nicotine dependence: Secondary | ICD-10-CM | POA: Diagnosis not present

## 2018-09-22 DIAGNOSIS — M1990 Unspecified osteoarthritis, unspecified site: Secondary | ICD-10-CM | POA: Insufficient documentation

## 2018-09-22 DIAGNOSIS — I110 Hypertensive heart disease with heart failure: Secondary | ICD-10-CM | POA: Insufficient documentation

## 2018-09-22 DIAGNOSIS — E7800 Pure hypercholesterolemia, unspecified: Secondary | ICD-10-CM | POA: Insufficient documentation

## 2018-09-22 DIAGNOSIS — G4733 Obstructive sleep apnea (adult) (pediatric): Secondary | ICD-10-CM | POA: Insufficient documentation

## 2018-09-22 DIAGNOSIS — I2510 Atherosclerotic heart disease of native coronary artery without angina pectoris: Secondary | ICD-10-CM | POA: Insufficient documentation

## 2018-09-22 DIAGNOSIS — Z955 Presence of coronary angioplasty implant and graft: Secondary | ICD-10-CM | POA: Diagnosis not present

## 2018-09-22 DIAGNOSIS — Z79899 Other long term (current) drug therapy: Secondary | ICD-10-CM | POA: Insufficient documentation

## 2018-09-22 DIAGNOSIS — Z888 Allergy status to other drugs, medicaments and biological substances status: Secondary | ICD-10-CM | POA: Diagnosis not present

## 2018-09-22 DIAGNOSIS — G4730 Sleep apnea, unspecified: Secondary | ICD-10-CM | POA: Diagnosis not present

## 2018-09-22 DIAGNOSIS — I10 Essential (primary) hypertension: Secondary | ICD-10-CM | POA: Diagnosis not present

## 2018-09-22 DIAGNOSIS — I4891 Unspecified atrial fibrillation: Secondary | ICD-10-CM | POA: Diagnosis not present

## 2018-09-22 HISTORY — PX: CARDIOVERSION: SHX1299

## 2018-09-22 SURGERY — CARDIOVERSION
Anesthesia: General

## 2018-09-22 MED ORDER — APIXABAN 5 MG PO TABS: 5.0000 mg | ORAL_TABLET | Freq: Two times a day (BID) | ORAL | Status: DC

## 2018-09-22 MED ORDER — AMIODARONE HCL 200 MG PO TABS: 200.0000 mg | ORAL_TABLET | Freq: Two times a day (BID) | ORAL | Status: DC

## 2018-09-22 MED ORDER — ATORVASTATIN CALCIUM 40 MG PO TABS: 40.0000 mg | ORAL_TABLET | Freq: Every day | ORAL | Status: DC

## 2018-09-22 MED ORDER — PROPOFOL 10 MG/ML IV BOLUS
INTRAVENOUS | Status: DC | PRN
  Administered 2018-09-22 (×2): 50 mg via INTRAVENOUS

## 2018-09-22 MED ORDER — SODIUM CHLORIDE 0.9 % IV SOLN
INTRAVENOUS | Status: DC
  Administered 2018-09-22: 11:00:00 via INTRAVENOUS

## 2018-09-22 MED ORDER — LIDOCAINE 2% (20 MG/ML) 5 ML SYRINGE
INTRAMUSCULAR | Status: DC | PRN
  Administered 2018-09-22: 60 mg via INTRAVENOUS

## 2018-09-22 NOTE — Transfer of Care (Signed)
Immediate Anesthesia Transfer of Care Note  Patient: Ronald Hull  Procedure(s) Performed: CARDIOVERSION (N/A )  Patient Location: PACU  Anesthesia Type:General  Level of Consciousness: drowsy and patient cooperative  Airway & Oxygen Therapy: Patient Spontanous Breathing and Patient connected to face mask oxygen  Post-op Assessment: Report given to RN and Post -op Vital signs reviewed and stable  Post vital signs: Reviewed and stable  Last Vitals:  Vitals Value Taken Time  BP    Temp    Pulse    Resp    SpO2      Last Pain:  Vitals:   09/22/18 1020  TempSrc: Oral  PainSc: 0-No pain         Complications: No apparent anesthesia complications

## 2018-09-22 NOTE — CV Procedure (Signed)
   Electrical Cardioversion Procedure Note Ronald Hull [id number] 1945/08/16  Procedure: Electrical Cardioversion Indications:  Atrial Fibrillation  Time Out: Verified patient identification, verified procedure,medications/allergies/relevent history reviewed, required imaging and test results available.  Performed  Procedure Details  The patient was NPO after midnight. Anesthesia was administered at the beside  by Dr.Fitzgerald with 90mg  of propofol and Lidocaine 60mg .  Cardioversion was done with synchronized biphasic defibrillation with AP pads with 150watts.  The patient failed to convert to normal sinus rhythm. Cardioversion was done with synchronized biphasic defibrillation with AP pads with 200watts. The patient converted to NSR.  The patient tolerated the procedure well   IMPRESSION:  Successful cardioversion of atrial fibrillation    Ronald Hull 09/22/2018, 10:21 AM

## 2018-09-22 NOTE — Anesthesia Preprocedure Evaluation (Addendum)
Anesthesia Evaluation  Patient identified by MRN, date of birth, ID band Patient awake    Reviewed: Allergy & Precautions, H&P , NPO status , Patient's Chart, lab work & pertinent test results  Airway Mallampati: III  TM Distance: >3 FB Neck ROM: Full    Dental no notable dental hx. (+) Teeth Intact, Dental Advisory Given   Pulmonary sleep apnea and Continuous Positive Airway Pressure Ventilation , former smoker,    Pulmonary exam normal breath sounds clear to auscultation       Cardiovascular hypertension, Pt. on medications and Pt. on home beta blockers + CAD and + Cardiac Stents   Rhythm:Irregular Rate:Normal     Neuro/Psych negative neurological ROS  negative psych ROS   GI/Hepatic negative GI ROS, Neg liver ROS,   Endo/Other  negative endocrine ROS  Renal/GU negative Renal ROS  negative genitourinary   Musculoskeletal  (+) Arthritis , Osteoarthritis,    Abdominal   Peds  Hematology negative hematology ROS (+)   Anesthesia Other Findings   Reproductive/Obstetrics negative OB ROS                            Anesthesia Physical Anesthesia Plan  ASA: III  Anesthesia Plan: General   Post-op Pain Management:    Induction: Intravenous  PONV Risk Score and Plan: 2 and Treatment may vary due to age or medical condition  Airway Management Planned: Mask  Additional Equipment:   Intra-op Plan:   Post-operative Plan:   Informed Consent: I have reviewed the patients History and Physical, chart, labs and discussed the procedure including the risks, benefits and alternatives for the proposed anesthesia with the patient or authorized representative who has indicated his/her understanding and acceptance.     Dental advisory given  Plan Discussed with: CRNA  Anesthesia Plan Comments:         Anesthesia Quick Evaluation

## 2018-09-22 NOTE — Anesthesia Postprocedure Evaluation (Signed)
Anesthesia Post Note  Patient: Ronald Hull  Procedure(s) Performed: CARDIOVERSION (N/A )     Patient location during evaluation: Endoscopy Anesthesia Type: General Level of consciousness: awake and alert Pain management: pain level controlled Vital Signs Assessment: post-procedure vital signs reviewed and stable Respiratory status: spontaneous breathing, nonlabored ventilation and respiratory function stable Cardiovascular status: blood pressure returned to baseline and stable Postop Assessment: no apparent nausea or vomiting Anesthetic complications: no    Last Vitals:  Vitals:   09/22/18 1110 09/22/18 1115  BP:  111/72  Pulse: (!) 56 60  Resp: (!) 21 (!) 23  Temp:    SpO2: 95% 96%    Last Pain:  Vitals:   09/22/18 1115  TempSrc:   PainSc: 0-No pain                 Delylah Stanczyk,W. EDMOND

## 2018-09-22 NOTE — Interval H&P Note (Signed)
History and Physical Interval Note:  09/22/2018 10:21 AM  Ronald Hull  has presented today for surgery, with the diagnosis of A-FIB.  The various methods of treatment have been discussed with the patient and family. After consideration of risks, benefits and other options for treatment, the patient has consented to  Procedure(s): CARDIOVERSION (N/A) as a surgical intervention.  The patient's history has been reviewed, patient examined, no change in status, stable for surgery.  I have reviewed the patient's chart and labs.  Questions were answered to the patient's satisfaction.     Armanda Magic

## 2018-09-23 ENCOUNTER — Telehealth (HOSPITAL_COMMUNITY): Payer: Self-pay

## 2018-09-23 ENCOUNTER — Encounter (HOSPITAL_COMMUNITY): Payer: Self-pay | Admitting: Cardiology

## 2018-09-23 NOTE — Telephone Encounter (Signed)
Phone call made to Pt to provide information about virtual cardiac rehab. Pt was responsive but does not want to proceed with virtual rehab. Pt stated he already tracks his exercise using another application on his smart device.Pt stated he wanted to return to program once we reopen.

## 2018-09-27 ENCOUNTER — Telehealth: Payer: Self-pay | Admitting: Cardiology

## 2018-09-27 NOTE — Telephone Encounter (Signed)
  Patient's wife is calling because Ronald Hull is having issues with chest tightness and he can't take deep breaths. He had a cardioversion done on 09/22/18.  He also has mild flank pain and has been up and down all night using the restroom. She thinks he may have a UTI. His oxygen level is 93 but got down to 84 during the night briefly. She would like to know what they need to do.

## 2018-09-27 NOTE — Telephone Encounter (Signed)
Called patient back about his wife's message.  Patient stated over the weekend he had some chest tightness, but it went away. Patient stated he had some issues with taking deep breaths, but that was with activity and comes and goes. Patient stated he does not feel that bad. Patient also had flank pain, which he stated was worse than his chest tightness that he had. Patient stated he had frequent urination over night. Informed patient that his message would be sent to Dr. Elberta Fortis for further advisement. Encouraged patient if his symptoms get worse to go to the ED. Encouraged patient to call his PCP about his flank pain and frequent urination. Patient verbalized understanding and stated he really did not feel that bad.

## 2018-09-28 NOTE — Telephone Encounter (Signed)
followed up w/ pt per [facility] request. Pt experiencing no more flank pain but c/o fatigue, sleeping a lot, and "coming down with something".  Feels like it is congested r/t.  He denies temperature or COVID symptoms. Pt advised to f/u w/ PCP to discuss further, pt agreeable. Pt will continue to monitor his HR/AFib.  States he is doing ok since decreasing his Metoprolol dose.

## 2018-09-30 ENCOUNTER — Other Ambulatory Visit: Payer: Self-pay

## 2018-09-30 ENCOUNTER — Ambulatory Visit (HOSPITAL_COMMUNITY)
Admission: RE | Admit: 2018-09-30 | Discharge: 2018-09-30 | Disposition: A | Discharge status: Home / Self Care | Payer: Medicare Other | Source: Ambulatory Visit | Attending: Physician Assistant | Admitting: Physician Assistant

## 2018-09-30 ENCOUNTER — Encounter (HOSPITAL_COMMUNITY): Payer: Self-pay | Admitting: Physician Assistant

## 2018-09-30 VITALS — BP 130/72 | HR 50 | Ht 71.0 in | Wt 273.0 lb

## 2018-09-30 DIAGNOSIS — E669 Obesity, unspecified: Secondary | ICD-10-CM | POA: Diagnosis not present

## 2018-09-30 DIAGNOSIS — Z6838 Body mass index (BMI) 38.0-38.9, adult: Secondary | ICD-10-CM | POA: Diagnosis not present

## 2018-09-30 DIAGNOSIS — I10 Essential (primary) hypertension: Secondary | ICD-10-CM | POA: Diagnosis not present

## 2018-09-30 DIAGNOSIS — I2510 Atherosclerotic heart disease of native coronary artery without angina pectoris: Secondary | ICD-10-CM | POA: Diagnosis not present

## 2018-09-30 DIAGNOSIS — G4733 Obstructive sleep apnea (adult) (pediatric): Secondary | ICD-10-CM | POA: Diagnosis not present

## 2018-09-30 DIAGNOSIS — Z79899 Other long term (current) drug therapy: Secondary | ICD-10-CM | POA: Diagnosis not present

## 2018-09-30 DIAGNOSIS — Z87891 Personal history of nicotine dependence: Secondary | ICD-10-CM | POA: Diagnosis not present

## 2018-09-30 DIAGNOSIS — I4819 Other persistent atrial fibrillation: Secondary | ICD-10-CM

## 2018-09-30 DIAGNOSIS — M1990 Unspecified osteoarthritis, unspecified site: Secondary | ICD-10-CM | POA: Diagnosis not present

## 2018-09-30 DIAGNOSIS — E7800 Pure hypercholesterolemia, unspecified: Secondary | ICD-10-CM | POA: Diagnosis not present

## 2018-09-30 DIAGNOSIS — Z7901 Long term (current) use of anticoagulants: Secondary | ICD-10-CM | POA: Insufficient documentation

## 2018-09-30 NOTE — Progress Notes (Signed)
Primary Care Physician: Renford Dills, MD Referring Physician: Dr. Elberta Fortis Cardiologist: Dr. Madolyn Frieze Ronald Hull is a 73 y.o. male with a h/o persistent afib, CAD, that is in the afib clinic for f/u. Patient reports that he felt well immediately after his afib ablation with much more energy and increased exercise tolerance at cardiac rehab. Unfortunately, he has gone into atrial fibrillation 2 times since his ablation.  The first time, he was on a trip to [place].  He traveled all day, walked 11,000 steps and was up most of the night.  He went into atrial fibrillation.  Went to the emergency room there and may control his rate.  He came back to [facility] and had a cardioversion.  He went back in atrial fibrillation the beginning of April.  He said that he missed 1 dose of metoprolol and Eliquis. He appeared to be in persistent afib at his visit with Dr Elberta Fortis and patient was started on amiodarone.   He is now s/p DCCV on 09/22/18 and reports he is "feeling great." His chest and flank discomfort have resolved. Appears to be maintaining SR. He did decrease his BB to 1.5 pills daily 2/2 bradycardia with some dizziness which has resolved.   Today, he denies symptoms of palpitations, chest pain, orthopnea, PND, lower extremity edema, dizziness, presyncope, syncope, or neurologic sequela. The patient is tolerating medications without difficulties and is otherwise without complaint today.   Past Medical History:  Diagnosis Date  . Arthritis   . Bronchitis 05/23/2018  . CAD (coronary artery disease) 02/24/2018   LHC 10/19: pLAD 50, mLAD 50 (dFFR=0.89), oLCx 95, pRCA 30, mRCA 80 >> PCI: DES to LCx; DES to RCA // [id number] Trial participant (will DC Plavix after 30 d and remain on ASA + Apixaban)  . Chest pain secondary to ablation, with expected mild elevation in troponin 05/22/2018  . Complication of anesthesia    BP dropped post colonoscopy  . Hypercholesteremia   . Hypertension   .  Persistent atrial fibrillation   . Sleep apnea    does use a cpap  . Wears glasses    Past Surgical History:  Procedure Laterality Date  . ATRIAL FIBRILLATION ABLATION N/A 05/20/2018   Procedure: ATRIAL FIBRILLATION ABLATION;  Surgeon: Regan Lemming, MD;  Location: [facility] INVASIVE CV LAB;  Service: Cardiovascular;  Laterality: N/A;  . CARDIOVERSION N/A 11/01/2015   Procedure: CARDIOVERSION;  Surgeon: [name], MD;  Location: [facility] ENDOSCOPY;  Service: Cardiovascular;  Laterality: N/A;  . CARDIOVERSION N/A 11/23/2015   Procedure: CARDIOVERSION;  Surgeon: Will [name], MD;  Location: [facility] ENDOSCOPY;  Service: Cardiovascular;  Laterality: N/A;  . CARDIOVERSION N/A 10/12/2017   Procedure: CARDIOVERSION;  Surgeon: [name], MD;  Location: [facility] ENDOSCOPY;  Service: Cardiovascular;  Laterality: N/A;  . CARDIOVERSION N/A 06/30/2018   Procedure: CARDIOVERSION;  Surgeon: Jake Bathe, MD;  Location: [facility] ENDOSCOPY;  Service: Cardiovascular;  Laterality: N/A;  . CARDIOVERSION N/A 09/22/2018   Procedure: CARDIOVERSION;  Surgeon: Quintella Reichert, MD;  Location: [facility] ENDOSCOPY;  Service: Cardiovascular;  Laterality: N/A;  . COLONOSCOPY    . CORONARY STENT INTERVENTION N/A 02/24/2018   Procedure: CORONARY STENT INTERVENTION;  Surgeon: Tonny Bollman, MD;  Location: [facility] INVASIVE CV LAB;  Service: Cardiovascular;  Laterality: N/A;  . INTRAVASCULAR PRESSURE WIRE/FFR STUDY N/A 02/24/2018   Procedure: INTRAVASCULAR PRESSURE WIRE/FFR STUDY;  Surgeon: Tonny Bollman, MD;  Location: [facility] INVASIVE CV LAB;  Service: Cardiovascular;  Laterality: N/A;  .  KNEE ARTHROSCOPY  2000  . LEFT HEART CATH AND CORONARY ANGIOGRAPHY N/A 02/24/2018   Procedure: LEFT HEART CATH AND CORONARY ANGIOGRAPHY;  Surgeon: Tonny Bollman, MD;  Location: [facility] INVASIVE CV LAB;  Service: Cardiovascular;  Laterality: N/A;  . SHOULDER ARTHROSCOPY WITH ROTATOR CUFF REPAIR AND SUBACROMIAL DECOMPRESSION Right 07/15/2012   Procedure: RIGHT  ARTHROSCOPY SHOULDER DECOMPRESSION  SUBACROMIAL PARTIAL ACROMIOPLASTY WITH CORACOACROMIAL RELEASE,  DISTAL CLAVICULECTOMY, resect biceps debride labrium WITH ROTATOR CUFF REPAIR;  Surgeon: [name], MD;  Location: [facility];  Service: Orthopedics;  Laterality: Right;    Current Outpatient Medications  Medication Sig Dispense Refill  . acetaminophen (TYLENOL) 325 MG tablet Take 325 mg by mouth every 6 (six) hours as needed (for pain.).     Marland Kitchen amiodarone (PACERONE) 200 MG tablet Take 200 mg by mouth daily.    Marland Kitchen apixaban (ELIQUIS) 5 MG TABS tablet Take 1 tablet (5 mg total) by mouth 2 (two) times daily.    . ASPIRIN LOW DOSE 81 MG chewable tablet Chew 81 mg by mouth every evening.   0  . atorvastatin (LIPITOR) 40 MG tablet Take 1 tablet (40 mg total) by mouth at bedtime.    . metoprolol tartrate (LOPRESSOR) 50 MG tablet Take 1 tablet (50 mg total) by mouth 2 (two) times daily. 180 tablet 3  . Multiple Vitamin (MULTIVITAMIN WITH MINERALS) TABS tablet Take 1 tablet by mouth 4 (four) times a week.    . nitroGLYCERIN (NITROSTAT) 0.4 MG SL tablet Place 1 tablet (0.4 mg total) under the tongue every 5 (five) minutes as needed. 25 tablet 2  . zaleplon (SONATA) 5 MG capsule Take 5 mg by mouth at bedtime as needed for sleep.     No current facility-administered medications for this encounter.     Allergies  Allergen Reactions  . Lisinopril Cough    Social History   Socioeconomic History  . Marital status: Married    Spouse name: Not on file  . Number of children: Not on file  . Years of education: Not on file  . Highest education level: Not on file  Occupational History  . Not on file  Social Needs  . Financial resource strain: Not very hard  . Food insecurity:    Worry: Never true    Inability: Never true  . Transportation needs:    Medical: No    Non-medical: No  Tobacco Use  . Smoking status: Former Smoker    Last attempt to quit: 07/09/1973    Years since  quitting: 45.2  . Smokeless tobacco: Never Used  Substance and Sexual Activity  . Alcohol use: Yes    Alcohol/week: 2.0 standard drinks    Types: 2 Glasses of wine per week  . Drug use: No  . Sexual activity: Not Currently    Birth control/protection: Abstinence  Lifestyle  . Physical activity:    Days per week: Not on file    Minutes per session: Not on file  . Stress: Not on file  Relationships  . Social connections:    Talks on phone: Not on file    Gets together: Not on file    Attends religious service: Not on file    Active member of club or organization: Not on file    Attends meetings of clubs or organizations: Not on file    Relationship status: Not on file  . Intimate partner violence:    Fear of current or ex partner: No    Emotionally  abused: No    Physically abused: No    Forced sexual activity: No  Other Topics Concern  . Not on file  Social History Narrative  . Not on file    Family History  Problem Relation Age of Onset  . Heart failure Father   . Other Mother        bad fall  . Clotting disorder Mother   . Cancer Sister   . Hypertension Sister   . Heart disease Brother     ROS- All systems are reviewed and negative except as per the HPI above  Physical Exam: Vitals:   09/30/18 1129  BP: 130/72  Pulse: (!) 50  Weight: 123.8 kg  Height: 5\' 11"  (1.803 m)   Wt Readings from Last 3 Encounters:  09/30/18 123.8 kg  09/22/18 123.4 kg  09/16/18 123.4 kg    Labs: Lab Results  Component Value Date   NA 137 09/16/2018   K 4.7 09/16/2018   CL 105 09/16/2018   CO2 25 09/16/2018   GLUCOSE 92 09/16/2018   BUN 15 09/16/2018   CREATININE 1.17 09/16/2018   CALCIUM 8.9 09/16/2018   No results found for: INR Lab Results  Component Value Date   CHOL 96 (L) 04/09/2018   HDL 31 (L) 04/09/2018   LDLCALC 50 04/09/2018   TRIG 77 04/09/2018    GEN- The patient is well appearing obese male, alert and oriented x 3 today.   HEENT-head normocephalic,  atraumatic, sclera clear, conjunctiva pink, hearing intact, trachea midline. Lungs- Clear to ausculation bilaterally, normal work of breathing Heart- Regular rate and rhythm, bradycardia, no murmurs, rubs or gallops  GI- soft, NT, ND, + BS Extremities- no clubbing, cyanosis, or edema MS- no significant deformity or atrophy Skin- no rash or lesion Psych- euthymic mood, full affect Neuro- strength and sensation are intact   EKG-  Sinus bradycardia, HR 50, PR 194, QRS 104, QTc 412  Echo 05/22/18 demonstrates - Procedure narrative: Transthoracic echocardiography. Image   quality was poor. The study was technically difficult, as a   result of poor acoustic windows. Intravenous contrast (Definity)   was administered. - Left ventricle: The cavity size was normal. Wall thickness was increased in a pattern of mild LVH. Systolic function was normal.   The estimated ejection fraction was in the range of 60% to 65%.   Wall motion was normal; there were no regional wall motion   abnormalities. Doppler parameters are consistent with restrictive   physiology, indicative of decreased left ventricular diastolic   compliance and/or increased left atrial pressure. Doppler   parameters are consistent with high ventricular filling pressure. - Aortic valve: Mildly calcified annulus. Trileaflet. There was   mild regurgitation. - Mitral valve: Moderately calcified annulus. Normal thickness   leaflets . There was mild regurgitation. - Tricuspid valve: There was mild regurgitation. - Systemic veins: IVC poorly visualized but appears dilated.  Assessment and Plan: 1. Persistent atrial fibrillation S/p ablation 05/20/18 with Dr Elberta Fortis. S/p DCCV on 09/22/18. Appears to be maintaining SR. Continue amiodarone 200 mg daily. Continue Eliquis 5 mg BID Continue Lopressor 50 mg daily.  This patients CHA2DS2-VASc Score and unadjusted Ischemic Stroke Rate (% per year) is equal to 3.2 % stroke rate/year from a score  of 3  Above score calculated as 1 point each if present [CHF, HTN, DM, Vascular=MI/PAD/Aortic Plaque, Age if 65-74, or Male] Above score calculated as 2 points each if present [Age > 75, or Stroke/TIA/TE]  2. CAD S/p DES  to RCA and LCx. No anginal symptoms. Continue present therapy.  3. HTN  Stable, no changes today.  4. OSA Encouraged compliance with CPAP therapy.  5. Obesity Body mass index is 38.08 kg/m. Lifestyle modification was discussed and encouraged including regular physical activity and weight reduction.   Follow up with Dr Elberta Fortis as scheduled. AF clinic in 6 months.   [name] PA-C [facility] [address] [place], [place] [address] [phone]

## 2018-10-20 DIAGNOSIS — R195 Other fecal abnormalities: Secondary | ICD-10-CM | POA: Diagnosis not present

## 2018-10-26 ENCOUNTER — Telehealth (HOSPITAL_COMMUNITY): Payer: Self-pay

## 2018-10-26 NOTE — Telephone Encounter (Signed)
Attempted to contact pt in regards to rescheduling for CR, LMTCB. °

## 2018-11-22 ENCOUNTER — Ambulatory Visit: Payer: Medicare Other | Admitting: Cardiology

## 2018-12-01 ENCOUNTER — Encounter (HOSPITAL_COMMUNITY): Payer: Self-pay | Admitting: *Deleted

## 2018-12-01 NOTE — Progress Notes (Signed)
Discharge Progress Report  Patient Details  Name: Ronald Hull MRN: [id number] Date of Birth: 06-16-1945 Referring Provider:     CARDIAC REHAB PHASE II ORIENTATION from 05/06/2018 in [place]  Referring Provider  Sherren Mocha MD        Number of Visits: 19  Reason for Discharge:  Early Exit:  Personal and unable to reach pt  Smoking History:  Social History   Tobacco Use  Smoking Status Former Smoker  . Quit date: 07/09/1973  . Years since quitting: 45.4  Smokeless Tobacco Never Used    Diagnosis:  No diagnosis found.  ADL UCSD:   Initial Exercise Prescription:   Discharge Exercise Prescription (Final Exercise Prescription Changes):   Functional Capacity:   Psychological, QOL, Others - Outcomes: PHQ 2/9: Depression screen [facility] 2/9 05/10/2018 02/12/2015  Decreased Interest 0 0  Down, Depressed, Hopeless 0 0  PHQ - 2 Score 0 0    Quality of Life:   Personal Goals: Goals established at orientation with interventions provided to work toward goal.    Personal Goals Discharge: Goals and Risk Factor Review    Row Name 12/01/18 1433             Core Components/Risk Factors/Patient Goals Review   Review  Ronald Hull has been discharged from the CR Program after 19 complete sessions.  Unable to contact patient after reopening of the CR department.       Expected Outcomes  Pt will continue to participate in exercise, nutrition, and lifestyle modification opportunities.          Exercise Goals and Review:   Exercise Goals Re-Evaluation:   Nutrition & Weight - Outcomes:    Nutrition:   Nutrition Discharge:   Education Questionnaire Score:   Goals reviewed with patient; copy given to patient.

## 2018-12-14 ENCOUNTER — Ambulatory Visit (INDEPENDENT_AMBULATORY_CARE_PROVIDER_SITE_OTHER): Payer: Medicare Other | Admitting: Cardiology

## 2018-12-14 ENCOUNTER — Encounter: Payer: Self-pay | Admitting: Cardiology

## 2018-12-14 ENCOUNTER — Other Ambulatory Visit: Payer: Self-pay

## 2018-12-14 VITALS — BP 142/78 | HR 50 | Ht 71.0 in | Wt 277.8 lb

## 2018-12-14 DIAGNOSIS — I4819 Other persistent atrial fibrillation: Secondary | ICD-10-CM

## 2018-12-14 NOTE — Progress Notes (Signed)
Electrophysiology Office Note   Date:  12/14/2018   ID:  Ronald Hull, DOB July 22, 1945, MRN [id number]  PCP:  Renford Dills, MD   Primary Electrophysiologist:  Regan Lemming, MD    No chief complaint on file.    History of Present Illness: Ronald Hull is a 73 y.o. male who presents today for electrophysiology evaluation.   He presented to his primary physician on 10/02/15 with an elevated and irregular heart rate. Cardioversion was attempted and was unsuccessful with repeat cardioversion successfully to 360 J. He was initially placed on amiodarone and has since been switched to Multaq. He went back into atrial fibrillation and had cardioversion 10/12/17.  He was scheduled for ablation, but on his pre-ablation CT scan was found to have coronary artery disease.  He was sent to catheterization and had a drug-eluting stent to his circumflex and RCA on 02/24/2018.  He had A. fib ablation January 2020.  He unfortunately went back into atrial fibrillation 2 times.  He has since been put on amiodarone and has done well.  Today, denies symptoms of palpitations, chest pain, shortness of breath, orthopnea, PND, lower extremity edema, claudication, dizziness, presyncope, syncope, bleeding, or neurologic sequela. The patient is tolerating medications without difficulties.    Past Medical History:  Diagnosis Date  . Arthritis   . Bronchitis 05/23/2018  . CAD (coronary artery disease) 02/24/2018   LHC 10/19: pLAD 50, mLAD 50 (dFFR=0.89), oLCx 95, pRCA 30, mRCA 80 >> PCI: DES to LCx; DES to RCA // Xience28 Trial participant (Talis Iwan DC Plavix after 30 d and remain on ASA + Apixaban)  . Chest pain secondary to ablation, with expected mild elevation in troponin 05/22/2018  . Complication of anesthesia    BP dropped post colonoscopy  . Hypercholesteremia   . Hypertension   . Persistent atrial fibrillation   . Sleep apnea    does use a cpap  . Wears glasses    Past Surgical History:   Procedure Laterality Date  . ATRIAL FIBRILLATION ABLATION N/A 05/20/2018   Procedure: ATRIAL FIBRILLATION ABLATION;  Surgeon: Regan Lemming, MD;  Location: [facility] INVASIVE CV LAB;  Service: Cardiovascular;  Laterality: N/A;  . CARDIOVERSION N/A 11/01/2015   Procedure: CARDIOVERSION;  Surgeon: [name], MD;  Location: [facility] ENDOSCOPY;  Service: Cardiovascular;  Laterality: N/A;  . CARDIOVERSION N/A 11/23/2015   Procedure: CARDIOVERSION;  Surgeon: Tarquin Welcher [name], MD;  Location: [facility] ENDOSCOPY;  Service: Cardiovascular;  Laterality: N/A;  . CARDIOVERSION N/A 10/12/2017   Procedure: CARDIOVERSION;  Surgeon: [name], MD;  Location: [facility] ENDOSCOPY;  Service: Cardiovascular;  Laterality: N/A;  . CARDIOVERSION N/A 06/30/2018   Procedure: CARDIOVERSION;  Surgeon: Jake Bathe, MD;  Location: [facility] ENDOSCOPY;  Service: Cardiovascular;  Laterality: N/A;  . CARDIOVERSION N/A 09/22/2018   Procedure: CARDIOVERSION;  Surgeon: Quintella Reichert, MD;  Location: [facility] ENDOSCOPY;  Service: Cardiovascular;  Laterality: N/A;  . COLONOSCOPY    . CORONARY STENT INTERVENTION N/A 02/24/2018   Procedure: CORONARY STENT INTERVENTION;  Surgeon: Tonny Bollman, MD;  Location: [facility] INVASIVE CV LAB;  Service: Cardiovascular;  Laterality: N/A;  . INTRAVASCULAR PRESSURE WIRE/FFR STUDY N/A 02/24/2018   Procedure: INTRAVASCULAR PRESSURE WIRE/FFR STUDY;  Surgeon: Tonny Bollman, MD;  Location: [facility] INVASIVE CV LAB;  Service: Cardiovascular;  Laterality: N/A;  . KNEE ARTHROSCOPY  2000  . LEFT HEART CATH AND CORONARY ANGIOGRAPHY N/A 02/24/2018   Procedure: LEFT HEART CATH AND CORONARY ANGIOGRAPHY;  Surgeon: Tonny Bollman, MD;  Location: [facility]  INVASIVE CV LAB;  Service: Cardiovascular;  Laterality: N/A;  . SHOULDER ARTHROSCOPY WITH ROTATOR CUFF REPAIR AND SUBACROMIAL DECOMPRESSION Right 07/15/2012   Procedure: RIGHT ARTHROSCOPY SHOULDER DECOMPRESSION  SUBACROMIAL PARTIAL ACROMIOPLASTY WITH CORACOACROMIAL RELEASE,  DISTAL  CLAVICULECTOMY, resect biceps debride labrium WITH ROTATOR CUFF REPAIR;  Surgeon: Loreta Aveaniel F Murphy, MD;  Location: [facility];  Service: Orthopedics;  Laterality: Right;     Current Outpatient Medications  Medication Sig Dispense Refill  . acetaminophen (TYLENOL) 325 MG tablet Take 325 mg by mouth every 6 (six) hours as needed (for pain.).     Marland Kitchen. amiodarone (PACERONE) 200 MG tablet Take 200 mg by mouth daily.    Marland Kitchen. apixaban (ELIQUIS) 5 MG TABS tablet Take 1 tablet (5 mg total) by mouth 2 (two) times daily.    . ASPIRIN LOW DOSE 81 MG chewable tablet Chew 81 mg by mouth every evening.   0  . atorvastatin (LIPITOR) 40 MG tablet Take 1 tablet (40 mg total) by mouth at bedtime.    . metoprolol tartrate (LOPRESSOR) 50 MG tablet Take 25 mg by mouth 2 (two) times daily.    . Multiple Vitamin (MULTIVITAMIN WITH MINERALS) TABS tablet Take 1 tablet by mouth 4 (four) times a week.    . nitroGLYCERIN (NITROSTAT) 0.4 MG SL tablet Place 1 tablet (0.4 mg total) under the tongue every 5 (five) minutes as needed. 25 tablet 2  . zaleplon (SONATA) 5 MG capsule Take 5 mg by mouth at bedtime as needed for sleep.     No current facility-administered medications for this visit.     Allergies:   Lisinopril   Social History:  The patient  reports that he quit smoking about 45 years ago. He has never used smokeless tobacco. He reports current alcohol use of about 2.0 standard drinks of alcohol per week. He reports that he does not use drugs.   Family History:  The patient's family history includes Cancer in his sister; Clotting disorder in his mother; Heart disease in his brother; Heart failure in his father; Hypertension in his sister; Other in his mother.    ROS:  Please see the history of present illness.   Otherwise, review of systems is positive for none.   All other systems are reviewed and negative.   PHYSICAL EXAM: VS:  BP (!) 142/78   Pulse (!) 50   Ht 5\' 11"  (1.803 m)   Wt 277 lb 12.8 oz  (126 kg)   SpO2 96%   BMI 38.75 kg/m  , BMI Body mass index is 38.75 kg/m. GEN: Well nourished, well developed, in no acute distress  HEENT: normal  Neck: no JVD, carotid bruits, or masses Cardiac: RRR; no murmurs, rubs, or gallops,no edema  Respiratory:  clear to auscultation bilaterally, normal work of breathing GI: soft, nontender, nondistended, + BS MS: no deformity or atrophy  Skin: warm and dry Neuro:  Strength and sensation are intact Psych: euthymic mood, full affect  EKG:  EKG is ordered today. Personal review of the ekg ordered shows sinus bradycardia   Recent Labs: 05/22/2018: B Natriuretic Peptide 83.0 09/16/2018: ALT 25; BUN 15; Creatinine, Ser 1.17; Hemoglobin 14.8; Platelets 171; Potassium 4.7; Sodium 137; TSH 4.382    Lipid Panel     Component Value Date/Time   CHOL 96 (L) 04/09/2018 0959   TRIG 77 04/09/2018 0959   HDL 31 (L) 04/09/2018 0959   CHOLHDL 3.1 04/09/2018 0959   LDLCALC 50 04/09/2018 0959     Wt  Readings from Last 3 Encounters:  12/14/18 277 lb 12.8 oz (126 kg)  09/30/18 273 lb (123.8 kg)  09/22/18 272 lb (123.4 kg)      Other studies Reviewed: Additional studies/ records that were reviewed today include: TTE 09/2015 - Left ventricle: The cavity size was normal. Wall thickness was   increased in a pattern of moderate LVH. Systolic function was   normal. The estimated ejection fraction was in the range of 55%   to 60%. Indeterminant diastolic function. Wall motion was normal;   there were no regional wall motion abnormalities. - Aortic valve: There was no stenosis. There was trivial   regurgitation. - Mitral valve: Mildly calcified annulus. There was mild   regurgitation. - Left atrium: The atrium was mildly dilated. - Right ventricle: The cavity size was normal. Systolic function   was normal. - Right atrium: The atrium was mildly dilated. - Tricuspid valve: Peak RV-RA gradient (S): 26 mm Hg. - Pulmonary arteries: PA peak pressure: 29  mm Hg (S). - Inferior vena cava: The vessel was normal in size. The   respirophasic diameter changes were in the normal range (= 50%),   consistent with normal central venous pressure.  LHC 02/24/18  Mid RCA lesion is 80% stenosed.  Prox RCA lesion is 30% stenosed.  Ost Cx to Prox Cx lesion is 95% stenosed.  Prox LAD lesion is 50% stenosed.  Prox LAD to Mid LAD lesion is 50% stenosed.  A drug-eluting stent was successfully placed using a STENT SIERRA 3.50 X 15 MM.  Post intervention, there is a 0% residual stenosis.  A drug-eluting stent was successfully placed using a STENT SIERRA 4.00 X 15 MM.  Post intervention, there is a 0% residual stenosis.   ASSESSMENT AND PLAN:  1.  Persistent atrial fibrillation: Currently on Eliquis.  Failed Multitak.  Was ablation 05/20/2018.  Unfortunately gone into atrial fibrillation 2 other times.  He is since been put on amiodarone and is tolerated it well.  Remains in sinus rhythm.  This patients CHA2DS2-VASc Score and unadjusted Ischemic Stroke Rate (% per year) is equal to 2.2 % stroke rate/year from a score of 2  Above score calculated as 1 point each if present [CHF, HTN, DM, Vascular=MI/PAD/Aortic Plaque, Age if 65-74, or Male] Above score calculated as 2 points each if present [Age > 75, or Stroke/TIA/TE]   2. Hypertension: Mildly elevated today but is usually normal.  No changes.  3. OSA: Stressed CPAP compliance  4.  Morbid obesity: Diet and exercise encouraged  5.  Coronary artery disease: Status post circumflex and RCA stents.  Continue aspirin and Eliquis.  Current medicines are reviewed at length with the patient today.   The patient does not have concerns regarding his medicines.  The following changes were made today: None  Labs/ tests ordered today include:  No orders of the defined types were placed in this encounter.   Disposition:   FU with Jisselle Poth 6 months  Signed, Deija Buhrman Meredith Leeds, MD  12/14/2018  9:52 AM     [facility] [address] [place] [address] [phone] (office) [phone] (fax)

## 2018-12-14 NOTE — Patient Instructions (Signed)
Medication Instructions:  Your physician recommends that you continue on your current medications as directed. Please refer to the Current Medication list given to you today.  If you need a refill on your cardiac medications before your next appointment, please call your pharmacy.   Labwork: None ordered  Testing/Procedures: None ordered  Follow-Up: Your physician wants you to follow-up in: 6 months with Dr. Camnitz.  You will receive a reminder letter in the mail two months in advance. If you don't receive a letter, please call our office to schedule the follow-up appointment.  Thank you for choosing [facility]!!   Bion Todorov, RN [phone]         

## 2018-12-15 ENCOUNTER — Other Ambulatory Visit: Payer: Self-pay | Admitting: Cardiology

## 2018-12-15 NOTE — Telephone Encounter (Signed)
Pt last saw Dr Curt Bears 12/14/18, last labs 09/16/18 Creat 1.17, age 73, weight 126kg, based on specified criteria pt is on appropriate dosage of Eliquis 5mg  BID.  Will refill rx.

## 2019-01-06 ENCOUNTER — Other Ambulatory Visit: Payer: Self-pay | Admitting: Cardiology

## 2019-01-06 DIAGNOSIS — M25512 Pain in left shoulder: Secondary | ICD-10-CM | POA: Diagnosis not present

## 2019-01-07 DIAGNOSIS — M25512 Pain in left shoulder: Secondary | ICD-10-CM | POA: Diagnosis not present

## 2019-01-11 DIAGNOSIS — M25512 Pain in left shoulder: Secondary | ICD-10-CM | POA: Diagnosis not present

## 2019-02-01 NOTE — Progress Notes (Signed)
Cardiology Office Note:    Date:  02/02/2019   ID:  SILVIO SAUSEDO, DOB 01/07/1946, MRN [id number]  PCP:  Seward Carol, MD  Cardiologist:  Sherren Mocha, MD  Electrophysiologist:  Constance Haw, MD   Referring MD: Seward Carol, MD   Chief Complaint  Patient presents with  . Follow-up    CAD    History of Present Illness:    Ronald Hull is a 73 y.o. male with:   Coronary artery disease   S/p DES to [facility] and DES to pLCx 01/2018  Cath 01/2018: LAD with borderline FFR  Xience 28 Trial participant (Apixaban, Clopidogrel, ASA x 30 days, then ASA+Apixaban only)  Persistent AFib   S/p several DCCVs w/ ERAF  Failed Amiodarone, Multaq  S/p PVI Ablation for AF 04/2018 (Camnitz)   C/b chest pain req readmission; elevated Tn related to recent ablation  Recurrent AF >> s/p DCCV 06/2018  Recurrent AF 07/2018 >> Amiodarone Rx  Hypertension   Hyperlipidemia   OSA  Echocardiogram 04/2018: EF 60-65, Gr 3 DD  Ronald Hull has had recurrent AF since his ablation and is now on Amiodarone and maintaining normal sinus rhythm.  He was last seen by Dr. Curt Bears in 11/2018.  He returns for follow-up.  He is here alone.  He has not had chest discomfort.  He has not had significant shortness of breath.  He has not had lower extremity swelling.  He has not had syncope.  He does have difficulty with balance and has fallen a couple of times.  He has not had any head trauma.  He did hurt his left shoulder and may need surgery at some point in the near future.    Prior CV studies:   The following studies were reviewed today:  Echo 05/22/2018 Mild LVH, EF 60-65, no RWMA, restrictive physio, mild AI, mod MAC, mild MR, mild TR  Cardiac catheterization 02/24/2018 LAD proximal 50, mid 50 (DFR 0.89) LCx ostial 95 RCA proximal 30, mid 80 PCI: 3.5 x 15 mm Xience Sierra DES to the ostial LCx PCI: 4 x 15 mm Xience Sierra DES to the mid RCA    Cardiac CTA w/ Calcium Score 02/17/18  IMPRESSION: 1. There is normal pulmonary vein drainage into the left atrium. 2. The left atrial appendage is fairly small with no evidence for a thrombus. 3. The esophagus runs in the left atrial midline and is not in the proximity to any of the pulmonary veins. 4. The esophagus runs to the left from the left atrial midline and is in the proximity to the left common pulmonary vein trunk. 5. Calcium score is 3318 that correlates with 96 percentile for age/sex. We will try to obtain CT FFR, if unable to process sec to motion, a functional nuclear study with D SPECT camera is recommended. 6. Mild calcifications of the aortic valve leaflet extending into the LVOT. 7. Posterior mitral annular calcifications  FFR 1. Left Main: No significant stenosis. 2. LAD: Proximal: 0.94, distal 0.77. 3. LCX: 0.5. 4. RCA: No significant stenosis.  IMPRESSION: 1. CT FFR analysis showed hemodynamically significant stenosis in the mid LAD and proximal LCX arteries. A cardiac catheterization is Recommended.  Echo 10/24/2015 Moderate LVH, EF 55-60, normal wall motion, trivial AI, mild MR, mild LAE, normal RVSF, mild RAE, PASP 29  Past Medical History:  Diagnosis Date  . Arthritis   . Bronchitis 05/23/2018  . CAD (coronary artery disease) 02/24/2018   LHC 10/19: pLAD 50, mLAD 50 (dFFR=0.89),  oLCx 95, pRCA 30, mRCA 80 >> PCI: DES to LCx; DES to RCA // [facility] participant (will DC Plavix after 30 d and remain on ASA + Apixaban)  . Chest pain secondary to ablation, with expected mild elevation in troponin 05/22/2018  . Complication of anesthesia    BP dropped post colonoscopy  . Hypercholesteremia   . Hypertension   . Persistent atrial fibrillation ([place])   . Sleep apnea    does use a cpap  . Wears glasses    Surgical Hx: The patient  has a past surgical history that includes Colonoscopy; Knee arthroscopy (2000); Shoulder arthroscopy with rotator cuff repair and subacromial decompression  (Right, 07/15/2012); Cardioversion (N/A, 11/01/2015); Cardioversion (N/A, 11/23/2015); Cardioversion (N/A, 10/12/2017); LEFT HEART CATH AND CORONARY ANGIOGRAPHY (N/A, 02/24/2018); INTRAVASCULAR PRESSURE WIRE/FFR STUDY (N/A, 02/24/2018); CORONARY STENT INTERVENTION (N/A, 02/24/2018); ATRIAL FIBRILLATION ABLATION (N/A, 05/20/2018); Cardioversion (N/A, 06/30/2018); and Cardioversion (N/A, 09/22/2018).   Current Medications: Current Meds  Medication Sig  . acetaminophen (TYLENOL) 325 MG tablet Take 325 mg by mouth every 6 (six) hours as needed (for pain.).   Marland Kitchen amiodarone (PACERONE) 200 MG tablet Take 200 mg by mouth daily.  . ASPIRIN LOW DOSE 81 MG chewable tablet Chew 81 mg by mouth every evening.   Marland Kitchen atorvastatin (LIPITOR) 40 MG tablet Take 1 tablet (40 mg total) by mouth at bedtime.  Marland Kitchen ELIQUIS 5 MG TABS tablet TAKE 1 TABLET(5 MG) BY MOUTH TWICE DAILY  . metoprolol tartrate (LOPRESSOR) 50 MG tablet Take 25 mg by mouth 2 (two) times daily.  . Multiple Vitamin (MULTIVITAMIN WITH MINERALS) TABS tablet Take 1 tablet by mouth 4 (four) times a week.  . nitroGLYCERIN (NITROSTAT) 0.4 MG SL tablet PLACE ONE TABLET UNDER TONGUE AS NEEDED FOR CHEST PAIN EVERY 5 MINUTES AS NEEDED  . zaleplon (SONATA) 5 MG capsule Take 5 mg by mouth at bedtime as needed for sleep.     Allergies:   Lisinopril   Social History   Tobacco Use  . Smoking status: Former Smoker    Quit date: 07/09/1973    Years since quitting: 45.6  . Smokeless tobacco: Never Used  Substance Use Topics  . Alcohol use: Yes    Alcohol/week: 2.0 standard drinks    Types: 2 Glasses of wine per week  . Drug use: No     Family Hx: The patient's family history includes Cancer in his sister; Clotting disorder in his mother; Heart disease in his brother; Heart failure in his father; Hypertension in his sister; Other in his mother.  ROS:   Please see the history of present illness.    ROS All other systems reviewed and are negative.   EKGs/Labs/Other  Test Reviewed:    EKG:  EKG is not ordered today.  The ekg ordered today demonstrates n/a  Recent Labs: 05/22/2018: B Natriuretic Peptide 83.0 09/16/2018: ALT 25; BUN 15; Creatinine, Ser 1.17; Hemoglobin 14.8; Platelets 171; Potassium 4.7; Sodium 137; TSH 4.382   Recent Lipid Panel Lab Results  Component Value Date/Time   CHOL 96 (L) 04/09/2018 09:59 AM   TRIG 77 04/09/2018 09:59 AM   HDL 31 (L) 04/09/2018 09:59 AM   CHOLHDL 3.1 04/09/2018 09:59 AM   LDLCALC 50 04/09/2018 09:59 AM     Physical Exam:    VS:  BP 140/70   Pulse 71   Ht _0  (1.803 m)   Wt 281 lb 12.8 oz (127.8 kg)   SpO2 97%   BMI 39.30 kg/m  Wt Readings from Last 3 Encounters:  02/02/19 281 lb 12.8 oz (127.8 kg)  12/14/18 277 lb 12.8 oz (126 kg)  09/30/18 273 lb (123.8 kg)     Physical Exam  Constitutional: He is oriented to person, place, and time. He appears well-developed and well-nourished. No distress.  HENT:  Head: Normocephalic and atraumatic.  Eyes: No scleral icterus.  Neck: No JVD present. No thyromegaly present.  Cardiovascular: Normal rate, regular rhythm and normal heart sounds.  No murmur heard. Pulmonary/Chest: Effort normal and breath sounds normal. He has no rales.  Abdominal: Soft. There is no hepatomegaly.  Musculoskeletal:        General: No edema.  Lymphadenopathy:    He has no cervical adenopathy.  Neurological: He is alert and oriented to person, place, and time.  Skin: Skin is warm and dry.  Psychiatric: He has a normal mood and affect.    ASSESSMENT & PLAN:    1. Coronary artery disease involving native coronary artery of native heart without angina pectoris S/p DES to ostial LCx and DES to mid RCA in 01/2018.  He has moderate residual disease in LAD which is borderline significance by DFR.  This is managed medically.  He is a participant in the Xience 28 Trial.  He is doing well without anginal symptoms.  His perioperative risk of a major cardiac event is low at 0.9%  according to the Revised Cardiac Risk Index.  Should he need surgery on his shoulder in the near future, he may proceed without further testing.  Given his multiple stents, he should remain on aspirin without interruption.  He has not had a stroke in the past and, if needed, could hold his Eliquis for 2 days prior to his procedure.  Continue aspirin, Eliquis, metoprolol, atorvastatin.  2. Persistent atrial fibrillation ([place]) He is status post multiple cardioversions and ultimately underwent PVI ablation in 2020.  He is now on amiodarone due to recurrence of atrial fibrillation.  TSH and ALT was normal in May 2020.  Continue current dose of amiodarone, Eliquis.  He does have some decrease in his heart rate at times and feels lethargic with this.  He has noted heart rates in the 40s at times.  I have suggested that we reduce his metoprolol to 12.5 mg twice daily.  However, he prefers that Dr. Curt Bears be involved in this decision.  I will review with Dr. Curt Bears.  3. Essential hypertension Blood pressure above target.  He had a cough with lisinopril in the past.  Start losartan 25 mg daily.  Obtain BMET in 2 weeks.  4. Pure hypercholesterolemia LDL optimal on most recent lab work.  Continue current Rx.    5. Lung nodule 3 mm left lower lobe pulmonary nodule by cardiac CTA in October 2019.  He is a prior smoker.  I will arrange a noncontrast chest CT for follow-up.  6. Falls frequently He has fallen several times recently.  I have asked him to follow-up with primary care to discuss the possibility referral to physical therapy.  Dispo:  Return in about 6 months (around 08/03/2019) for Routine Follow Up, w/ Dr. Burt Knack, or Richardson Dopp, PA-C, (virtual or in-person).   Medication Adjustments/Labs and Tests Ordered: Current medicines are reviewed at length with the patient today.  Concerns regarding medicines are outlined above.  Tests Ordered: Orders Placed This Encounter  Procedures  . CT Chest Wo  Contrast  . Basic metabolic panel   Medication Changes: Meds ordered this encounter  Medications  . losartan (COZAAR) 25 MG tablet    Sig: Take 1 tablet (25 mg total) by mouth daily.    Dispense:  90 tablet    Refill:  3    Signed, Richardson Dopp, PA-C  02/02/2019 11:57 AM    [facility] [place], [place],   [address] Phone: [phone]; Fax: [phone]

## 2019-02-02 ENCOUNTER — Encounter: Payer: Self-pay | Admitting: Physician Assistant

## 2019-02-02 ENCOUNTER — Other Ambulatory Visit: Payer: Self-pay

## 2019-02-02 ENCOUNTER — Ambulatory Visit: Payer: Medicare Other | Admitting: Physician Assistant

## 2019-02-02 VITALS — BP 140/70 | HR 71 | Ht 71.0 in | Wt 281.8 lb

## 2019-02-02 DIAGNOSIS — I4819 Other persistent atrial fibrillation: Secondary | ICD-10-CM | POA: Diagnosis not present

## 2019-02-02 DIAGNOSIS — E7800 Pure hypercholesterolemia, unspecified: Secondary | ICD-10-CM | POA: Diagnosis not present

## 2019-02-02 DIAGNOSIS — R296 Repeated falls: Secondary | ICD-10-CM

## 2019-02-02 DIAGNOSIS — R918 Other nonspecific abnormal finding of lung field: Secondary | ICD-10-CM

## 2019-02-02 DIAGNOSIS — I10 Essential (primary) hypertension: Secondary | ICD-10-CM

## 2019-02-02 DIAGNOSIS — R911 Solitary pulmonary nodule: Secondary | ICD-10-CM

## 2019-02-02 DIAGNOSIS — I2510 Atherosclerotic heart disease of native coronary artery without angina pectoris: Secondary | ICD-10-CM | POA: Diagnosis not present

## 2019-02-02 MED ORDER — LOSARTAN POTASSIUM 25 MG PO TABS: 25.0000 mg | ORAL_TABLET | Freq: Every day | ORAL | 3 refills | Status: DC

## 2019-02-02 NOTE — Patient Instructions (Addendum)
Medication Instructions:   Your physician has recommended you make the following change in your medication:   1) Start Losartan 25MG , 1 tablet by mouth once a day  If you need a refill on your cardiac medications before your next appointment, please call your pharmacy.   Lab work:  Your physician recommends that you return for lab work on 02/16/19 at 10:00AM for a BMET  If you have labs (blood work) drawn today and your tests are completely normal, you will receive your results only by: Marland Kitchen MyChart Message (if you have MyChart) OR . A paper copy in the mail If you have any lab test that is abnormal or we need to change your treatment, we will call you to review the results.  Testing/Procedures:  Non-Cardiac CT scanning, (CAT scanning), is a noninvasive, special x-ray that produces cross-sectional images of the body using x-rays and a computer. CT scans help physicians diagnose and treat medical conditions. For some CT exams, a contrast material is used to enhance visibility in the area of the body being studied. CT scans provide greater clarity and reveal more details than regular x-ray exams.  Follow-Up:  At [facility], you and your health needs are our priority.  As part of our continuing mission to provide you with exceptional heart care, we have created designated Provider Care Teams.  These Care Teams include your primary Cardiologist (physician) and Advanced Practice Providers (APPs -  Physician Assistants and Nurse Practitioners) who all work together to provide you with the care you need, when you need it. You will need a follow up appointment in:  6 months.  Please call our office 2 months in advance to schedule this appointment.  You may see Sherren Mocha, MD or one of the following Advanced Practice Providers on your designated Care Team: Richardson Dopp, PA-C [place], [place] . Daune Perch, NP

## 2019-02-03 DIAGNOSIS — Z006 Encounter for examination for normal comparison and control in clinical research program: Secondary | ICD-10-CM

## 2019-02-03 NOTE — Research (Signed)
Xience 28 Research Study 12 Month Follow up    Contact Type:  []  Office/hospital [x]  Phone  Subject compliant with DAPT regimen per protocol requirement?  [x]  Yes  []  No  Any changes in antiplatelet medication? []  Yes  [x]  No  Any new or changed adverse events? []  Yes  [x]  No  Any new or changed anticoagulant medication? []  Yes  [x]  No     Only to be used at 1 month follow up visit     Subject is free from Myocardial Infarction, repeat coronary revascularization, stroke, or stent thrombosis within the 1 month after stenting? []  Yes  []  No  Subject is compliant with 1 month DAPT without interruption of either ASA and/or P2Y12 receptor inhibitor for >7 consecutive days []  Yes  []  No  Investigator is comfortable with stopping P2Y12 inhibitor after 25-month visit and subject has been instructed to stop P2Y12? []  Yes  []  No  If questions 3, 4, and 5 are all Yes - Subject is 1 month clear? []  Yes  []  No

## 2019-02-06 ENCOUNTER — Telehealth: Payer: Self-pay | Admitting: Physician Assistant

## 2019-02-06 NOTE — Telephone Encounter (Signed)
Please call patient. I reviewed with Dr. Curt Bears.  He agreed we should reduce his dose of Metoprolol. PLAN:  1. Reduce Metoprolol Tartrate to 12.5 mg twice daily  Richardson Dopp, PA-C    02/06/2019 10:34 AM

## 2019-02-07 MED ORDER — METOPROLOL TARTRATE 25 MG PO TABS: 12.5000 mg | ORAL_TABLET | Freq: Two times a day (BID) | ORAL | 2 refills | Status: DC

## 2019-02-07 NOTE — Telephone Encounter (Signed)
S/w pt's wife per [facility]) is aware of recommendation's.  Pt is to take one half tablet (12.5 mg ) bid, sent in to requested pharmacy.  Medication list updated.  Spouse repeated instructions on medication.

## 2019-02-09 DIAGNOSIS — S46012A Strain of muscle(s) and tendon(s) of the rotator cuff of left shoulder, initial encounter: Secondary | ICD-10-CM | POA: Diagnosis not present

## 2019-02-09 DIAGNOSIS — W1839XA Other fall on same level, initial encounter: Secondary | ICD-10-CM | POA: Diagnosis not present

## 2019-02-09 DIAGNOSIS — M25512 Pain in left shoulder: Secondary | ICD-10-CM | POA: Diagnosis not present

## 2019-02-09 DIAGNOSIS — R2689 Other abnormalities of gait and mobility: Secondary | ICD-10-CM | POA: Diagnosis not present

## 2019-02-14 ENCOUNTER — Other Ambulatory Visit: Payer: Self-pay | Admitting: Physician Assistant

## 2019-02-16 ENCOUNTER — Other Ambulatory Visit: Payer: Self-pay

## 2019-02-16 ENCOUNTER — Other Ambulatory Visit: Payer: Medicare Other

## 2019-02-16 DIAGNOSIS — I10 Essential (primary) hypertension: Secondary | ICD-10-CM | POA: Diagnosis not present

## 2019-02-16 DIAGNOSIS — Z23 Encounter for immunization: Secondary | ICD-10-CM | POA: Diagnosis not present

## 2019-02-16 DIAGNOSIS — E7800 Pure hypercholesterolemia, unspecified: Secondary | ICD-10-CM

## 2019-02-16 DIAGNOSIS — I4819 Other persistent atrial fibrillation: Secondary | ICD-10-CM | POA: Diagnosis not present

## 2019-02-16 DIAGNOSIS — I2510 Atherosclerotic heart disease of native coronary artery without angina pectoris: Secondary | ICD-10-CM

## 2019-02-16 DIAGNOSIS — R296 Repeated falls: Secondary | ICD-10-CM

## 2019-02-16 DIAGNOSIS — R918 Other nonspecific abnormal finding of lung field: Secondary | ICD-10-CM

## 2019-02-16 DIAGNOSIS — R911 Solitary pulmonary nodule: Secondary | ICD-10-CM

## 2019-02-16 LAB — BASIC METABOLIC PANEL
BUN/Creatinine Ratio: 15 (ref 10–24)
BUN: 16 mg/dL (ref 8–27)
CO2: 22 mmol/L (ref 20–29)
Calcium: 9 mg/dL (ref 8.6–10.2)
Chloride: 102 mmol/L (ref 96–106)
Creatinine, Ser: 1.08 mg/dL (ref 0.76–1.27)
GFR calc Af Amer: 78 mL/min/{1.73_m2} (ref >59)
GFR calc non Af Amer: 68 mL/min/{1.73_m2} (ref >59)
Glucose: 93 mg/dL (ref 65–99)
Potassium: 4.7 mmol/L (ref 3.5–5.2)
Sodium: 136 mmol/L (ref 134–144)

## 2019-02-17 ENCOUNTER — Telehealth: Payer: Self-pay

## 2019-02-17 NOTE — Telephone Encounter (Signed)
Notes recorded by Frederik Schmidt, RN on 02/17/2019 at 8:04 AM EDT  The patient has been notified of the result and verbalized understanding. All questions (if any) were answered.  Frederik Schmidt, RN 02/17/2019 8:04 AM

## 2019-02-17 NOTE — Telephone Encounter (Signed)
-----   Message from Liliane Shi, [place] sent at 02/16/2019  5:45 PM EDT ----- Kidney function (creatinine), potassium normal. PLAN:   -Continue current medications and follow up as planned.  Richardson Dopp, PA-C    02/16/2019 5:44 PM

## 2019-02-24 ENCOUNTER — Other Ambulatory Visit: Payer: Self-pay

## 2019-02-24 ENCOUNTER — Ambulatory Visit (INDEPENDENT_AMBULATORY_CARE_PROVIDER_SITE_OTHER)
Admission: RE | Admit: 2019-02-24 | Discharge: 2019-02-24 | Disposition: A | Discharge status: Home / Self Care | Payer: Medicare Other | Source: Ambulatory Visit | Attending: Physician Assistant | Admitting: Physician Assistant

## 2019-02-24 DIAGNOSIS — R918 Other nonspecific abnormal finding of lung field: Secondary | ICD-10-CM

## 2019-02-25 ENCOUNTER — Telehealth: Payer: Self-pay

## 2019-02-25 ENCOUNTER — Encounter: Payer: Self-pay | Admitting: Physician Assistant

## 2019-02-25 DIAGNOSIS — R918 Other nonspecific abnormal finding of lung field: Secondary | ICD-10-CM

## 2019-02-25 HISTORY — DX: Other nonspecific abnormal finding of lung field: R91.8

## 2019-02-25 NOTE — Telephone Encounter (Signed)
Notes recorded by Frederik Schmidt, RN on 02/25/2019 at 2:21 PM EDT  [id number] 10/30  ------

## 2019-02-25 NOTE — Telephone Encounter (Signed)
-----   Message from Scott T Weaver, PA-C sent at 02/25/2019  1:00 PM EDT ----- The Chest CT demonstrates that the L upper lobe nodule that was on the prior CT is unchanged.  There are new small (6 mm) L upper lobe nodules that need follow up.  There are some changes left from an old infection (pleural plaques) and some calcification noted (atherosclerosis) which is consistent with the known blockages documented on the prior cardiac catheterization.  There are also changes related to a prior history of smoking (emphysema).   He will need another scan to follow these nodules.  But, I would like him to see Pulmonology to more closely follow this. PLAN:   -Schedule repeat Chest CT without contrast in 6 mos for follow up  -Refer to Pulmonology for pulmonary nodules    -Send copy to PCP. Scott Weaver, PA-C    02/25/2019 12:50 PM  

## 2019-02-28 ENCOUNTER — Telehealth: Payer: Self-pay

## 2019-02-28 DIAGNOSIS — R918 Other nonspecific abnormal finding of lung field: Secondary | ICD-10-CM

## 2019-02-28 NOTE — Telephone Encounter (Signed)
-----   Message from Liliane Shi, [place] sent at 02/25/2019  1:00 PM EDT ----- The Chest CT demonstrates that the L upper lobe nodule that was on the prior CT is unchanged.  There are new small (6 mm) L upper lobe nodules that need follow up.  There are some changes left from an old infection (pleural plaques) and some calcification noted (atherosclerosis) which is consistent with the known blockages documented on the prior cardiac catheterization.  There are also changes related to a prior history of smoking (emphysema).   He will need another scan to follow these nodules.  But, I would like him to see Pulmonology to more closely follow this. PLAN:   -Schedule repeat Chest CT without contrast in 6 mos for follow up  -Refer to Pulmonology for pulmonary nodules    -Send copy to PCP. Richardson Dopp, PA-C    02/25/2019 12:50 PM

## 2019-02-28 NOTE — Telephone Encounter (Signed)
Notes recorded by Frederik Schmidt, RN on 02/28/2019 at 9:58 AM EST  The patient has been notified of the result and verbalized understanding. All questions (if any) were answered.  Frederik Schmidt, RN 02/28/2019 9:58 AM

## 2019-03-15 ENCOUNTER — Other Ambulatory Visit: Payer: Self-pay | Admitting: Cardiology

## 2019-03-16 ENCOUNTER — Ambulatory Visit: Payer: Medicare Other | Admitting: Critical Care Medicine

## 2019-03-16 ENCOUNTER — Other Ambulatory Visit: Payer: Self-pay | Admitting: Cardiology

## 2019-03-16 ENCOUNTER — Other Ambulatory Visit: Payer: Self-pay

## 2019-03-16 ENCOUNTER — Encounter: Payer: Self-pay | Admitting: Critical Care Medicine

## 2019-03-16 VITALS — BP 138/84 | HR 55 | Ht 71.0 in | Wt 280.2 lb

## 2019-03-16 DIAGNOSIS — R918 Other nonspecific abnormal finding of lung field: Secondary | ICD-10-CM | POA: Diagnosis not present

## 2019-03-16 NOTE — Progress Notes (Signed)
Synopsis: Referred in November 2020 for lung nodules by Tereso Newcomer T, PA-C.  Subjective:   PATIENT ID: Ronald Hull GENDER: male DOB: 1945/08/07, MRN: [id number]  Chief Complaint  Patient presents with  . Consult    Pt referred by Dr. Alben Spittle. Pt a CT performed about 1 month ago which showed some lung nodules and also some scarring of lungs. Pt has complaints of SOB which can happen at any time, even laying in bed. Pt denies any complaints of cough or chest pain.    Ronald Hull is a 73 year old gentleman referred for evaluation of pulmonary nodules.  He initially had a CT scan performed prior to left heart catheterization demonstrated nodules.  On follow-up CT scan he had new nodules.  He is a former smoker, 2 packs/day x 10 years before quitting in 1975.  He has had pneumonia several times-1 more severe case about 5 years ago, and several cases of walking pneumonia.  He had childhood asthma which he outgrew.  He has chronic OSA treated with CPAP, which controls his symptoms well.  He denies cough, wheezing, sputum production.  He does notice when he he gets colds they take longer to resolve.  He often has to use albuterol inhaler, but his symptoms do return to baseline.  These have been rather infrequently, usually about 1 time per winter.  He denies cough, sputum production.  He has shortness of breath when sitting still that he relates to a sensation in his throat, but this improves when he stands up and walks around.  He is able to jog about 30 minutes/day. No previous history of malignancy, no family history of lung cancer.       Past Medical History:  Diagnosis Date  . Arthritis   . Bronchitis 05/23/2018  . CAD (coronary artery disease) 02/24/2018   LHC 10/19: pLAD 50, mLAD 50 (dFFR=0.89), oLCx 95, pRCA 30, mRCA 80 >> PCI: DES to LCx; DES to RCA // [id number] Trial participant (will DC Plavix after 30 d and remain on ASA + Apixaban)  . Chest pain secondary to ablation, with  expected mild elevation in troponin 05/22/2018  . Complication of anesthesia    BP dropped post colonoscopy  . Hypercholesteremia   . Hypertension   . Lung nodules 02/25/2019   Chest CT 01/2019: LUL nodule unchanged (benign); new LUL nodules 6 mm, aortic atherosclerosis, emphysema, partially calcified pleural plaques suggesting prior empyema or hemothorax  . Persistent atrial fibrillation (HCC)   . Sleep apnea    does use a cpap  . Wears glasses      Family History  Problem Relation Age of Onset  . Heart failure Father   . Other Mother        bad fall  . Clotting disorder Mother   . Cancer Sister   . Hypertension Sister   . Heart disease Brother      Past Surgical History:  Procedure Laterality Date  . ATRIAL FIBRILLATION ABLATION N/A 05/20/2018   Procedure: ATRIAL FIBRILLATION ABLATION;  Surgeon: Regan Lemming, MD;  Location: [facility] INVASIVE CV LAB;  Service: Cardiovascular;  Laterality: N/A;  . CARDIOVERSION N/A 11/01/2015   Procedure: CARDIOVERSION;  Surgeon: [name], MD;  Location: [facility] ENDOSCOPY;  Service: Cardiovascular;  Laterality: N/A;  . CARDIOVERSION N/A 11/23/2015   Procedure: CARDIOVERSION;  Surgeon: Will [name], MD;  Location: [facility] ENDOSCOPY;  Service: Cardiovascular;  Laterality: N/A;  . CARDIOVERSION N/A 10/12/2017   Procedure: CARDIOVERSION;  Surgeon: Zoila Shutter  C, MD;  Location: [facility] ENDOSCOPY;  Service: Cardiovascular;  Laterality: N/A;  . CARDIOVERSION N/A 06/30/2018   Procedure: CARDIOVERSION;  Surgeon: Jake Bathe, MD;  Location: [facility] ENDOSCOPY;  Service: Cardiovascular;  Laterality: N/A;  . CARDIOVERSION N/A 09/22/2018   Procedure: CARDIOVERSION;  Surgeon: Quintella Reichert, MD;  Location: [facility] ENDOSCOPY;  Service: Cardiovascular;  Laterality: N/A;  . COLONOSCOPY    . CORONARY STENT INTERVENTION N/A 02/24/2018   Procedure: CORONARY STENT INTERVENTION;  Surgeon: Tonny Bollman, MD;  Location: [facility] INVASIVE CV LAB;  Service: Cardiovascular;   Laterality: N/A;  . INTRAVASCULAR PRESSURE WIRE/FFR STUDY N/A 02/24/2018   Procedure: INTRAVASCULAR PRESSURE WIRE/FFR STUDY;  Surgeon: Tonny Bollman, MD;  Location: [facility] INVASIVE CV LAB;  Service: Cardiovascular;  Laterality: N/A;  . KNEE ARTHROSCOPY  2000  . LEFT HEART CATH AND CORONARY ANGIOGRAPHY N/A 02/24/2018   Procedure: LEFT HEART CATH AND CORONARY ANGIOGRAPHY;  Surgeon: Tonny Bollman, MD;  Location: [facility] INVASIVE CV LAB;  Service: Cardiovascular;  Laterality: N/A;  . SHOULDER ARTHROSCOPY WITH ROTATOR CUFF REPAIR AND SUBACROMIAL DECOMPRESSION Right 07/15/2012   Procedure: RIGHT ARTHROSCOPY SHOULDER DECOMPRESSION  SUBACROMIAL PARTIAL ACROMIOPLASTY WITH CORACOACROMIAL RELEASE,  DISTAL CLAVICULECTOMY, resect biceps debride labrium WITH ROTATOR CUFF REPAIR;  Surgeon: [name], MD;  Location: [facility];  Service: Orthopedics;  Laterality: Right;    Social History   Socioeconomic History  . Marital status: Married    Spouse name: Not on file  . Number of children: Not on file  . Years of education: Not on file  . Highest education level: Not on file  Occupational History  . Not on file  Social Needs  . Financial resource strain: Not very hard  . Food insecurity    Worry: Never true    Inability: Never true  . Transportation needs    Medical: No    Non-medical: No  Tobacco Use  . Smoking status: Former Smoker    Packs/day: 2.00    Years: 10.00    Pack years: 20.00    Types: Cigarettes    Quit date: 07/09/1973    Years since quitting: 45.7  . Smokeless tobacco: Never Used  Substance and Sexual Activity  . Alcohol use: Yes    Alcohol/week: 2.0 standard drinks    Types: 2 Glasses of wine per week  . Drug use: No  . Sexual activity: Not Currently    Birth control/protection: Abstinence  Lifestyle  . Physical activity    Days per week: Not on file    Minutes per session: Not on file  . Stress: Not on file  Relationships  . Social Musician  on phone: Not on file    Gets together: Not on file    Attends religious service: Not on file    Active member of club or organization: Not on file    Attends meetings of clubs or organizations: Not on file    Relationship status: Not on file  . Intimate partner violence    Fear of current or ex partner: No    Emotionally abused: No    Physically abused: No    Forced sexual activity: No  Other Topics Concern  . Not on file  Social History Narrative  . Not on file     Allergies  Allergen Reactions  . Lisinopril Cough     Immunization History  Administered Date(s) Administered  . Influenza, High Dose Seasonal PF 02/23/2019    Outpatient Medications Prior to  Visit  Medication Sig Dispense Refill  . acetaminophen (TYLENOL) 325 MG tablet Take 325 mg by mouth every 6 (six) hours as needed (for pain.).     Marland Kitchen. amiodarone (PACERONE) 200 MG tablet Take 200 mg by mouth daily.    . ASPIRIN LOW DOSE 81 MG chewable tablet Chew 81 mg by mouth every evening.   0  . atorvastatin (LIPITOR) 40 MG tablet TAKE 1 TABLET BY MOUTH DAILY 90 tablet 1  . ELIQUIS 5 MG TABS tablet TAKE 1 TABLET(5 MG) BY MOUTH TWICE DAILY 60 tablet 5  . losartan (COZAAR) 25 MG tablet Take 1 tablet (25 mg total) by mouth daily. 90 tablet 3  . metoprolol tartrate (LOPRESSOR) 25 MG tablet Take 0.5 tablets (12.5 mg total) by mouth 2 (two) times daily. 45 tablet 2  . Multiple Vitamin (MULTIVITAMIN WITH MINERALS) TABS tablet Take 1 tablet by mouth 4 (four) times a week.    . nitroGLYCERIN (NITROSTAT) 0.4 MG SL tablet PLACE ONE TABLET UNDER TONGUE AS NEEDED FOR CHEST PAIN EVERY 5 MINUTES AS NEEDED 25 tablet 2  . zaleplon (SONATA) 5 MG capsule Take 5 mg by mouth at bedtime as needed for sleep.     No facility-administered medications prior to visit.     Review of Systems  Constitutional: Negative for chills and fever.  HENT: Negative.   Eyes: Positive for redness.  Respiratory: Positive for shortness of breath. Negative for  cough, sputum production and wheezing.   Cardiovascular: Positive for palpitations and leg swelling.  Gastrointestinal: Negative.   Genitourinary: Negative.   Musculoskeletal: Positive for joint pain.  Skin: Negative for rash.  Neurological: Negative.   Endo/Heme/Allergies: Negative.      Objective:   Vitals:   03/16/19 1102  BP: 138/84  Pulse: (!) 55  SpO2: 97%  Weight: 280 lb 3.2 oz (127.1 kg)  Height: 5\' 11"  (1.803 m)   97% on   RA BMI Readings from Last 3 Encounters:  03/16/19 39.08 kg/m  02/02/19 39.30 kg/m  12/14/18 38.75 kg/m   Wt Readings from Last 3 Encounters:  03/16/19 280 lb 3.2 oz (127.1 kg)  02/02/19 281 lb 12.8 oz (127.8 kg)  12/14/18 277 lb 12.8 oz (126 kg)    Physical Exam Vitals signs reviewed.  Constitutional:      Appearance: Normal appearance. He is obese. He is not ill-appearing.  HENT:     Head: Normocephalic and atraumatic.     Nose:     Comments: Deferred due to masking requirement.    Mouth/Throat:     Comments: Deferred due to masking requirement. Eyes:     General: No scleral icterus. Neck:     Musculoskeletal: Neck supple.  Cardiovascular:     Rate and Rhythm: Normal rate and regular rhythm.     Heart sounds: No murmur.  Pulmonary:     Comments: Breathing comfortably on room air, no conversational dyspnea or tachypnea.  Clear to auscultation bilaterally. Abdominal:     General: There is no distension.     Palpations: Abdomen is soft.     Tenderness: There is no abdominal tenderness.  Musculoskeletal:        General: No deformity.     Comments: Minimal symmetric pretibial edema with stasis dermatitis  Lymphadenopathy:     Cervical: No cervical adenopathy.  Skin:    General: Skin is warm and dry.     Findings: No rash.  Neurological:     General: No focal deficit present.  Mental Status: He is alert.     Coordination: Coordination normal.  Psychiatric:        Mood and Affect: Mood normal.        Behavior: Behavior  normal.      CBC    Component Value Date/Time   WBC 7.0 09/16/2018 0954   RBC 4.80 09/16/2018 0954   HGB 14.8 09/16/2018 0954   HGB 15.2 05/13/2018 1345   HCT 45.4 09/16/2018 0954   HCT 44.6 05/13/2018 1345   PLT 171 09/16/2018 0954   PLT 187 05/13/2018 1345   MCV 94.6 09/16/2018 0954   MCV 93 05/13/2018 1345   MCH 30.8 09/16/2018 0954   MCHC 32.6 09/16/2018 0954   RDW 13.9 09/16/2018 0954   RDW 13.7 05/13/2018 1345   LYMPHSABS 1.5 09/16/2018 0954   MONOABS 0.8 09/16/2018 0954   EOSABS 0.1 09/16/2018 0954   BASOSABS 0.0 09/16/2018 0954      Chest Imaging- films reviewed: CT chest 02/24/2019-apical RUL nodule anterior LUL nodule, posterior RUL nodule.  Calcified pleural thickening on the right.  Elevated right hemidiaphragm with overlying atelectasis.  Linear basilar scars bilaterally with an RLL bleb.  Pulmonary Functions Testing Results: No flowsheet data found.   Echocardiogram 05/22/2018 : LVEF 60-65%, mild LVH with diastolic dysfunction.  Mild MR & AR.  Heart Catheterization 02/24/2018: Multivessel coronary disease, DES x 2 placed    Assessment & Plan:     ICD-10-CM   1. Lung nodules  R91.8 CT Chest Wo Contrast  2. Abnormal findings on diagnostic imaging of lung  R91.8 CT Chest Wo Contrast    LUL 66mm nodule, former smoker  -Follow up CT scan in 6 months -Declined PFTs due to lack of desire for chronic inhaler use to prevent exacerbations.  Given the infrequency, does not seem that he would benefit from chronic inhaled meds even if PFTs demonstrated mild obstruction.   Up-to-date on pneumococcal and influenza vaccines.  RTC in 6 months after CT.   Current Outpatient Medications:  .  acetaminophen (TYLENOL) 325 MG tablet, Take 325 mg by mouth every 6 (six) hours as needed (for pain.). , Disp: , Rfl:  .  amiodarone (PACERONE) 200 MG tablet, Take 200 mg by mouth daily., Disp: , Rfl:  .  ASPIRIN LOW DOSE 81 MG chewable tablet, Chew 81 mg by mouth every  evening. , Disp: , Rfl: 0 .  atorvastatin (LIPITOR) 40 MG tablet, TAKE 1 TABLET BY MOUTH DAILY, Disp: 90 tablet, Rfl: 1 .  ELIQUIS 5 MG TABS tablet, TAKE 1 TABLET(5 MG) BY MOUTH TWICE DAILY, Disp: 60 tablet, Rfl: 5 .  losartan (COZAAR) 25 MG tablet, Take 1 tablet (25 mg total) by mouth daily., Disp: 90 tablet, Rfl: 3 .  metoprolol tartrate (LOPRESSOR) 25 MG tablet, Take 0.5 tablets (12.5 mg total) by mouth 2 (two) times daily., Disp: 45 tablet, Rfl: 2 .  Multiple Vitamin (MULTIVITAMIN WITH MINERALS) TABS tablet, Take 1 tablet by mouth 4 (four) times a week., Disp: , Rfl:  .  nitroGLYCERIN (NITROSTAT) 0.4 MG SL tablet, PLACE ONE TABLET UNDER TONGUE AS NEEDED FOR CHEST PAIN EVERY 5 MINUTES AS NEEDED, Disp: 25 tablet, Rfl: 2 .  zaleplon (SONATA) 5 MG capsule, Take 5 mg by mouth at bedtime as needed for sleep., Disp: , Rfl:    Julian Hy, DO [facility] 03/16/2019 11:37 AM

## 2019-03-16 NOTE — Patient Instructions (Addendum)
Thank you for visiting Dr. Carlis Abbott at [facility]. We recommend the following: Orders Placed This Encounter  Procedures  . CT Chest Wo Contrast   Orders Placed This Encounter  Procedures  . CT Chest Wo Contrast    May 2021    Standing Status:   Future    Standing Expiration Date:   05/15/2020    Order Specific Question:   ** REASON FOR EXAM (FREE TEXT)    Answer:   follow up 6 mm nodule    Order Specific Question:   Preferred imaging location?    Answer:   [facility]    Order Specific Question:   Radiology Contrast Protocol - do NOT remove file path    Answer:   [email or web address]    No orders of the defined types were placed in this encounter.   Return in about 6 months (around 09/13/2019).    Please do your part to reduce the spread of COVID-19.

## 2019-03-17 DIAGNOSIS — H524 Presbyopia: Secondary | ICD-10-CM | POA: Diagnosis not present

## 2019-03-22 ENCOUNTER — Other Ambulatory Visit: Payer: Self-pay

## 2019-03-22 MED ORDER — AMIODARONE HCL 200 MG PO TABS: 200.0000 mg | ORAL_TABLET | Freq: Every day | ORAL | 2 refills | Status: DC

## 2019-04-05 ENCOUNTER — Other Ambulatory Visit: Payer: Self-pay

## 2019-04-05 ENCOUNTER — Ambulatory Visit (HOSPITAL_COMMUNITY)
Admission: RE | Admit: 2019-04-05 | Discharge: 2019-04-05 | Disposition: A | Discharge status: Home / Self Care | Payer: Medicare Other | Source: Ambulatory Visit | Attending: Physician Assistant | Admitting: Physician Assistant

## 2019-04-05 VITALS — BP 150/76 | HR 54 | Ht 71.0 in | Wt 278.0 lb

## 2019-04-05 DIAGNOSIS — Z8249 Family history of ischemic heart disease and other diseases of the circulatory system: Secondary | ICD-10-CM | POA: Diagnosis not present

## 2019-04-05 DIAGNOSIS — D6869 Other thrombophilia: Secondary | ICD-10-CM | POA: Diagnosis not present

## 2019-04-05 DIAGNOSIS — Z87891 Personal history of nicotine dependence: Secondary | ICD-10-CM | POA: Diagnosis not present

## 2019-04-05 DIAGNOSIS — I10 Essential (primary) hypertension: Secondary | ICD-10-CM | POA: Diagnosis not present

## 2019-04-05 DIAGNOSIS — Z79899 Other long term (current) drug therapy: Secondary | ICD-10-CM | POA: Diagnosis not present

## 2019-04-05 DIAGNOSIS — I4819 Other persistent atrial fibrillation: Secondary | ICD-10-CM | POA: Insufficient documentation

## 2019-04-05 DIAGNOSIS — Z6838 Body mass index (BMI) 38.0-38.9, adult: Secondary | ICD-10-CM | POA: Diagnosis not present

## 2019-04-05 DIAGNOSIS — E669 Obesity, unspecified: Secondary | ICD-10-CM | POA: Diagnosis not present

## 2019-04-05 DIAGNOSIS — I2510 Atherosclerotic heart disease of native coronary artery without angina pectoris: Secondary | ICD-10-CM | POA: Insufficient documentation

## 2019-04-05 DIAGNOSIS — Z7901 Long term (current) use of anticoagulants: Secondary | ICD-10-CM | POA: Diagnosis not present

## 2019-04-05 DIAGNOSIS — G4733 Obstructive sleep apnea (adult) (pediatric): Secondary | ICD-10-CM | POA: Diagnosis not present

## 2019-04-05 DIAGNOSIS — Z7982 Long term (current) use of aspirin: Secondary | ICD-10-CM | POA: Diagnosis not present

## 2019-04-05 HISTORY — DX: Other thrombophilia: D68.69

## 2019-04-05 NOTE — Progress Notes (Addendum)
Primary Care Physician: Renford Dills, MD Referring Physician: Dr. Elberta Fortis Cardiologist: Dr. Madolyn Frieze Ronald Hull is a 73 y.o. male with a h/o persistent afib, CAD, that is in the afib clinic for f/u. Patient reports that he felt well immediately after his afib ablation with much more energy and increased exercise tolerance at cardiac rehab. Unfortunately, he has gone into atrial fibrillation 2 times since his ablation.  The first time, he was on a trip to [place].  He traveled all day, walked 11,000 steps and was up most of the night.  He went into atrial fibrillation.  Went to the emergency room there and may control his rate.  He came back to [facility] and had a cardioversion.  He went back in atrial fibrillation the beginning of April.  He said that he missed 1 dose of metoprolol and Eliquis. He appeared to be in persistent afib at his visit with Dr Elberta Fortis and patient was started on amiodarone. Patient underwent successful DCCV on 09/22/18. He is on Eliquis for a CHADS2VASC score of 3.   On follow up today, patient reports that he has done very well with no heart racing or palpitations. He is tolerating the medication without difficulty. His fatigue has improved on the lower dose of BB.   Today, he denies symptoms of palpitations, chest pain, orthopnea, PND, lower extremity edema, dizziness, presyncope, syncope, or neurologic sequela. The patient is tolerating medications without difficulties and is otherwise without complaint today.   Past Medical History:  Diagnosis Date  . Arthritis   . Bronchitis 05/23/2018  . CAD (coronary artery disease) 02/24/2018   LHC 10/19: pLAD 50, mLAD 50 (dFFR=0.89), oLCx 95, pRCA 30, mRCA 80 >> PCI: DES to LCx; DES to RCA // [id number] Trial participant (will DC Plavix after 30 d and remain on ASA + Apixaban)  . Chest pain secondary to ablation, with expected mild elevation in troponin 05/22/2018  . Complication of anesthesia    BP dropped post colonoscopy   . Hypercholesteremia   . Hypertension   . Lung nodules 02/25/2019   Chest CT 01/2019: LUL nodule unchanged (benign); new LUL nodules 6 mm, aortic atherosclerosis, emphysema, partially calcified pleural plaques suggesting prior empyema or hemothorax  . Persistent atrial fibrillation (HCC)   . Sleep apnea    does use a cpap  . Wears glasses    Past Surgical History:  Procedure Laterality Date  . ATRIAL FIBRILLATION ABLATION N/A 05/20/2018   Procedure: ATRIAL FIBRILLATION ABLATION;  Surgeon: Regan Lemming, MD;  Location: [facility] INVASIVE CV LAB;  Service: Cardiovascular;  Laterality: N/A;  . CARDIOVERSION N/A 11/01/2015   Procedure: CARDIOVERSION;  Surgeon: [name], MD;  Location: [facility] ENDOSCOPY;  Service: Cardiovascular;  Laterality: N/A;  . CARDIOVERSION N/A 11/23/2015   Procedure: CARDIOVERSION;  Surgeon: Will [name], MD;  Location: [facility] ENDOSCOPY;  Service: Cardiovascular;  Laterality: N/A;  . CARDIOVERSION N/A 10/12/2017   Procedure: CARDIOVERSION;  Surgeon: [name], MD;  Location: [facility] ENDOSCOPY;  Service: Cardiovascular;  Laterality: N/A;  . CARDIOVERSION N/A 06/30/2018   Procedure: CARDIOVERSION;  Surgeon: Jake Bathe, MD;  Location: [facility] ENDOSCOPY;  Service: Cardiovascular;  Laterality: N/A;  . CARDIOVERSION N/A 09/22/2018   Procedure: CARDIOVERSION;  Surgeon: Quintella Reichert, MD;  Location: [facility] ENDOSCOPY;  Service: Cardiovascular;  Laterality: N/A;  . COLONOSCOPY    . CORONARY STENT INTERVENTION N/A 02/24/2018   Procedure: CORONARY STENT INTERVENTION;  Surgeon: Tonny Bollman, MD;  Location: [facility] INVASIVE CV  LAB;  Service: Cardiovascular;  Laterality: N/A;  . INTRAVASCULAR PRESSURE WIRE/FFR STUDY N/A 02/24/2018   Procedure: INTRAVASCULAR PRESSURE WIRE/FFR STUDY;  Surgeon: Sherren Mocha, MD;  Location: [place] CV LAB;  Service: Cardiovascular;  Laterality: N/A;  . KNEE ARTHROSCOPY  2000  . LEFT HEART CATH AND CORONARY ANGIOGRAPHY N/A 02/24/2018   Procedure:  LEFT HEART CATH AND CORONARY ANGIOGRAPHY;  Surgeon: Sherren Mocha, MD;  Location: [place] CV LAB;  Service: Cardiovascular;  Laterality: N/A;  . SHOULDER ARTHROSCOPY WITH ROTATOR CUFF REPAIR AND SUBACROMIAL DECOMPRESSION Right 07/15/2012   Procedure: RIGHT ARTHROSCOPY SHOULDER DECOMPRESSION  SUBACROMIAL PARTIAL ACROMIOPLASTY WITH CORACOACROMIAL RELEASE,  DISTAL CLAVICULECTOMY, resect biceps debride labrium WITH ROTATOR CUFF REPAIR;  Surgeon: Ninetta Lights, MD;  Location: [place];  Service: Orthopedics;  Laterality: Right;    Current Outpatient Medications  Medication Sig Dispense Refill  . acetaminophen (TYLENOL) 325 MG tablet Take 325 mg by mouth every 6 (six) hours as needed (for pain.).     Marland Kitchen amiodarone (PACERONE) 200 MG tablet Take 1 tablet (200 mg total) by mouth daily. 90 tablet 2  . ASPIRIN LOW DOSE 81 MG chewable tablet Chew 81 mg by mouth every evening.   0  . atorvastatin (LIPITOR) 40 MG tablet TAKE 1 TABLET BY MOUTH DAILY 90 tablet 1  . ELIQUIS 5 MG TABS tablet TAKE 1 TABLET(5 MG) BY MOUTH TWICE DAILY 60 tablet 5  . losartan (COZAAR) 25 MG tablet Take 1 tablet (25 mg total) by mouth daily. 90 tablet 3  . metoprolol tartrate (LOPRESSOR) 25 MG tablet Take 0.5 tablets (12.5 mg total) by mouth 2 (two) times daily. 45 tablet 2  . Multiple Vitamin (MULTIVITAMIN WITH MINERALS) TABS tablet Take 1 tablet by mouth 4 (four) times a week.    . nitroGLYCERIN (NITROSTAT) 0.4 MG SL tablet PLACE ONE TABLET UNDER TONGUE AS NEEDED FOR CHEST PAIN EVERY 5 MINUTES AS NEEDED 25 tablet 2  . zaleplon (SONATA) 5 MG capsule Take 5 mg by mouth at bedtime as needed for sleep.     No current facility-administered medications for this encounter.     Allergies  Allergen Reactions  . Lisinopril Cough    Social History   Socioeconomic History  . Marital status: Married    Spouse name: Not on file  . Number of children: Not on file  . Years of education: Not on file  . Highest  education level: Not on file  Occupational History  . Not on file  Social Needs  . Financial resource strain: Not very hard  . Food insecurity    Worry: Never true    Inability: Never true  . Transportation needs    Medical: No    Non-medical: No  Tobacco Use  . Smoking status: Former Smoker    Packs/day: 2.00    Years: 10.00    Pack years: 20.00    Types: Cigarettes    Quit date: 07/09/1973    Years since quitting: 45.7  . Smokeless tobacco: Never Used  Substance and Sexual Activity  . Alcohol use: Yes    Alcohol/week: 2.0 standard drinks    Types: 2 Glasses of wine per week  . Drug use: No  . Sexual activity: Not Currently    Birth control/protection: Abstinence  Lifestyle  . Physical activity    Days per week: Not on file    Minutes per session: Not on file  . Stress: Not on file  Relationships  . Social connections  Talks on phone: Not on file    Gets together: Not on file    Attends religious service: Not on file    Active member of club or organization: Not on file    Attends meetings of clubs or organizations: Not on file    Relationship status: Not on file  . Intimate partner violence    Fear of current or ex partner: No    Emotionally abused: No    Physically abused: No    Forced sexual activity: No  Other Topics Concern  . Not on file  Social History Narrative  . Not on file    Family History  Problem Relation Age of Onset  . Heart failure Father   . Other Mother        bad fall  . Clotting disorder Mother   . Cancer Sister   . Hypertension Sister   . Heart disease Brother     ROS- All systems are reviewed and negative except as per the HPI above  Physical Exam: Vitals:   04/05/19 0843  BP: (!) 150/76  Pulse: (!) 54  Weight: 126.1 kg  Height: 5\' 11"  (1.803 m)   Wt Readings from Last 3 Encounters:  04/05/19 126.1 kg  03/16/19 127.1 kg  02/02/19 127.8 kg    Labs: Lab Results  Component Value Date   NA 136 02/16/2019   K 4.7  02/16/2019   CL 102 02/16/2019   CO2 22 02/16/2019   GLUCOSE 93 02/16/2019   BUN 16 02/16/2019   CREATININE 1.08 02/16/2019   CALCIUM 9.0 02/16/2019   No results found for: INR Lab Results  Component Value Date   CHOL 96 (L) 04/09/2018   HDL 31 (L) 04/09/2018   LDLCALC 50 04/09/2018   TRIG 77 04/09/2018    GEN- The patient is well appearing obese male, alert and oriented x 3 today.   HEENT-head normocephalic, atraumatic, sclera clear, conjunctiva pink, hearing intact, trachea midline. Lungs- Clear to ausculation bilaterally, normal work of breathing Heart- Regular rate and rhythm, no murmurs, rubs or gallops  GI- soft, NT, ND, + BS Extremities- no clubbing, cyanosis, or edema MS- no significant deformity or atrophy Skin- no rash or lesion Psych- euthymic mood, full affect Neuro- strength and sensation are intact   EKG-  SB HR 54, 1st degree AV block, PR 214, QRS 102, QTc 396  Echo 05/22/18 demonstrates - Procedure narrative: Transthoracic echocardiography. Image   quality was poor. The study was technically difficult, as a   result of poor acoustic windows. Intravenous contrast (Definity)   was administered. - Left ventricle: The cavity size was normal. Wall thickness was increased in a pattern of mild LVH. Systolic function was normal.   The estimated ejection fraction was in the range of 60% to 65%.   Wall motion was normal; there were no regional wall motion   abnormalities. Doppler parameters are consistent with restrictive   physiology, indicative of decreased left ventricular diastolic   compliance and/or increased left atrial pressure. Doppler   parameters are consistent with high ventricular filling pressure. - Aortic valve: Mildly calcified annulus. Trileaflet. There was   mild regurgitation. - Mitral valve: Moderately calcified annulus. Normal thickness   leaflets . There was mild regurgitation. - Tricuspid valve: There was mild regurgitation. - Systemic  veins: IVC poorly visualized but appears dilated.  Assessment and Plan: 1. Persistent atrial fibrillation S/p ablation 05/20/18 with Dr Elberta Fortisamnitz. S/p DCCV on 09/22/18.  Patient appears to be maintaining SR.  Continue amiodarone 200 mg daily. Recent lab work and chest imaging reviewed.  Continue Eliquis 5 mg BID Continue Lopressor 12.5 mg daily.  This patients CHA2DS2-VASc Score and unadjusted Ischemic Stroke Rate (% per year) is equal to 3.2 % stroke rate/year from a score of 3  Above score calculated as 1 point each if present [CHF, HTN, DM, Vascular=MI/PAD/Aortic Plaque, Age if 65-74, or Male] Above score calculated as 2 points each if present [Age > 75, or Stroke/TIA/TE]  2. CAD S/p DES to RCA and LCx.  No anginal symptoms today. Followed by Dr Excell Seltzer. On ASA.  3. HTN  Stable, no changes today.  4. OSA The importance of adequate treatment of sleep apnea was discussed today in order to improve our ability to maintain sinus rhythm long term. Encouraged compliance with CPAP therapy.  5. Obesity Body mass index is 38.77 kg/m. Lifestyle modification was discussed and encouraged including regular physical activity and weight reduction.   Follow up with Dr Elberta Fortis and Dr Excell Seltzer per recall. AF clinic in one year.   [name] PA-C Afib Clinic [facility] [address] [place], [place] [address] [phone]

## 2019-05-03 DIAGNOSIS — H25012 Cortical age-related cataract, left eye: Secondary | ICD-10-CM | POA: Diagnosis not present

## 2019-05-03 DIAGNOSIS — H2512 Age-related nuclear cataract, left eye: Secondary | ICD-10-CM | POA: Diagnosis not present

## 2019-05-03 DIAGNOSIS — H25812 Combined forms of age-related cataract, left eye: Secondary | ICD-10-CM | POA: Diagnosis not present

## 2019-05-03 DIAGNOSIS — H25042 Posterior subcapsular polar age-related cataract, left eye: Secondary | ICD-10-CM | POA: Diagnosis not present

## 2019-05-24 DIAGNOSIS — H2511 Age-related nuclear cataract, right eye: Secondary | ICD-10-CM | POA: Diagnosis not present

## 2019-05-24 DIAGNOSIS — H25041 Posterior subcapsular polar age-related cataract, right eye: Secondary | ICD-10-CM | POA: Diagnosis not present

## 2019-05-24 DIAGNOSIS — H25811 Combined forms of age-related cataract, right eye: Secondary | ICD-10-CM | POA: Diagnosis not present

## 2019-05-24 DIAGNOSIS — H25011 Cortical age-related cataract, right eye: Secondary | ICD-10-CM | POA: Diagnosis not present

## 2019-06-11 ENCOUNTER — Other Ambulatory Visit: Payer: Self-pay | Admitting: Physician Assistant

## 2019-06-11 ENCOUNTER — Other Ambulatory Visit: Payer: Self-pay | Admitting: Cardiology

## 2019-06-13 NOTE — Telephone Encounter (Signed)
Pt last saw Clint Fenton, PA on 04/05/19, last labs 02/16/19 Creat 1.08, age 74, weight 126.1, based on specified criteria pt is on appropriate dosage of Eliquis 5mg  BID.  Will refill rx.

## 2019-06-21 ENCOUNTER — Ambulatory Visit: Payer: Medicare Other

## 2019-07-06 ENCOUNTER — Telehealth: Payer: Self-pay | Admitting: Cardiology

## 2019-07-06 NOTE — Telephone Encounter (Signed)
Pt reports going into AFib at noon today.  This is the 1st episode in 10 months. HRs currently ranging from 80-100 bpm. Denies any symptoms of dizziness/light headedness, fatigue, SOB, edema. He just got back from a 4 day trip to [place], possible cold last week (running nose/congestiion.  He received his 2nd Covid shot 3 weeks ago. He does report 2 glasses of wine yesterday. He also reports his BP has been trending up lately and he takes extra Metoprolol on days when elevated. Some days he take 50 mg (current med list reflects 12/5 BID). He did take an extra Metoprolol when he went into AFib this afternoon.   Advised pt to call office back if he remains out of rhythm for more than 24-48 hrs and/or symptoms begin. However he also reports HRs having been dipping into the 40s for many months now.  Pt aware medication may certainly be the cause and we may need to switch to alternative medication that may not affect the HR as much. Pt is scheduled to follow up w/ Dr. Elberta Fortis next Tuesday.  Aware that MD may wait to address medication changes at that OV being that he is currently not symptomatic.  Pt agreed to call back if continued AFib over next day/two and understands I will only call back if Dr. Elberta Fortis wants to make a change prior to next weeks already scheduled follow up. Patient verbalized understanding and agreeable to plan.

## 2019-07-06 NOTE — Telephone Encounter (Signed)
  Pt said he's in afib right now. First time after 10 months. He said BP is fine and HR is 80-115. He wanted to speak with DR. Camnitz or his nurse.  Please call

## 2019-07-07 NOTE — Telephone Encounter (Signed)
[name] need to be set up for DCCV. Kerina Simoneau discuss at clinic appt.

## 2019-07-08 ENCOUNTER — Telehealth: Payer: Self-pay | Admitting: Cardiology

## 2019-07-08 NOTE — Telephone Encounter (Signed)
Patient calling to check in with Sherri. She wanted an update on his HR. He states it stays between 60-90. He states that she wanted to see if his afib had gotten worse and he states it has stayed the same. Patient aware Roanna Raider is not in today.

## 2019-07-12 ENCOUNTER — Ambulatory Visit: Payer: Medicare Other | Admitting: Cardiology

## 2019-07-12 ENCOUNTER — Encounter: Payer: Self-pay | Admitting: Cardiology

## 2019-07-12 ENCOUNTER — Other Ambulatory Visit: Payer: Self-pay

## 2019-07-12 VITALS — BP 136/84 | HR 79 | Ht 71.0 in | Wt 276.6 lb

## 2019-07-12 DIAGNOSIS — Z01812 Encounter for preprocedural laboratory examination: Secondary | ICD-10-CM | POA: Diagnosis not present

## 2019-07-12 DIAGNOSIS — I4819 Other persistent atrial fibrillation: Secondary | ICD-10-CM

## 2019-07-12 LAB — BASIC METABOLIC PANEL
BUN/Creatinine Ratio: 13 (ref 10–24)
BUN: 15 mg/dL (ref 8–27)
CO2: 20 mmol/L (ref 20–29)
Calcium: 8.7 mg/dL (ref 8.6–10.2)
Chloride: 103 mmol/L (ref 96–106)
Creatinine, Ser: 1.18 mg/dL (ref 0.76–1.27)
GFR calc Af Amer: 70 mL/min/{1.73_m2} (ref >59)
GFR calc non Af Amer: 60 mL/min/{1.73_m2} (ref >59)
Glucose: 76 mg/dL (ref 65–99)
Potassium: 4.7 mmol/L (ref 3.5–5.2)
Sodium: 138 mmol/L (ref 134–144)

## 2019-07-12 LAB — CBC
Hematocrit: 45.9 % (ref 37.5–51.0)
Hemoglobin: 15.5 g/dL (ref 13.0–17.7)
MCH: 32.1 pg (ref 26.6–33.0)
MCHC: 33.8 g/dL (ref 31.5–35.7)
MCV: 95 fL (ref 79–97)
Platelets: 182 10*3/uL (ref 150–450)
RBC: 4.83 x10E6/uL (ref 4.14–5.80)
RDW: 13.6 % (ref 11.6–15.4)
WBC: 6.7 10*3/uL (ref 3.4–10.8)

## 2019-07-12 NOTE — Patient Instructions (Addendum)
Medication Instructions:  Your physician recommends that you continue on your current medications as directed. Please refer to the Current Medication list given to you today.  *If you need a refill on your cardiac medications before your next appointment, please call your pharmacy*   Lab Work: Today: BMET & CBC If you have labs (blood work) drawn today and your tests are completely normal, you will receive your results only by: Marland Kitchen MyChart Message (if you have MyChart) OR . A paper copy in the mail If you have any lab test that is abnormal or we need to change your treatment, we will call you to review the results.   Testing/Procedures: Your physician has recommended that you have a Cardioversion (DCCV). Electrical Cardioversion uses a jolt of electricity to your heart either through paddles or wired patches attached to your chest. This is a controlled, usually prescheduled, procedure. Defibrillation is done under light anesthesia in the hospital, and you usually go home the day of the procedure. This is done to get your heart back into a normal rhythm. You are not awake for the procedure. Please see the instructions below.  Follow-Up: At [facility], you and your health needs are our priority.  As part of our continuing mission to provide you with exceptional heart care, we have created designated Provider Care Teams.  These Care Teams include your primary Cardiologist (physician) and Advanced Practice Providers (APPs -  Physician Assistants and Nurse Practitioners) who all work together to provide you with the care you need, when you need it.  Your next appointment:   3 month(s)  The format for your next appointment:   In Person  Provider:   Loman Brooklyn, MD   Thank you for choosing [facility]!!   Dory Horn, RN [phone]    Other Instructions   Implantable Device Instructions   COVID TEST-- On 07/13/19 @ 9:00 am - You will go to [facility] hospital ([address], [place]) for your Covid testing.   This is a drive thru test site.  There will be multiple testing areas.  Be sure to share with the first checkpoint that you are there for pre-procedure/surgery testing. This will put you into the right (yellow) lane that leads to the PAT testing team.   Stay in your car and the nurse team will come to your car to test you.  After you are tested please go home and self quarantine until the day of your procedure.     Cardioversion Instructions: Please arrive at the [facility] main entrance of [facility] hospital on 07/15/2019 at  7:30 am Do not eat or drink after midnight prior to procedure Take your medications as normal the morning of your procedure with a sip of water. You will need someone to drive you home at discharge

## 2019-07-12 NOTE — Progress Notes (Signed)
Electrophysiology Office Note   Date:  07/12/2019   ID:  Ronald Hull, DOB 1945/09/08, MRN [id number]  PCP:  Renford Dills, MD   Primary Electrophysiologist:  Regan Lemming, MD    No chief complaint on file.    History of Present Illness: Ronald Hull is a 74 y.o. male who presents today for electrophysiology evaluation.   He presented to his primary physician on 10/02/15 with an elevated and irregular heart rate. Cardioversion was attempted and was unsuccessful with repeat cardioversion successfully to 360 J. He was initially placed on amiodarone and has since been switched to Multaq. He went back into atrial fibrillation and had cardioversion 10/12/17.  He was scheduled for ablation, but on his pre-ablation CT scan was found to have coronary artery disease.  He was sent to catheterization and had a drug-eluting stent to his circumflex and RCA on 02/24/2018.  He had A. fib ablation January 2020.  He unfortunately went back into atrial fibrillation 2 times.  He has since been put on amiodarone and has done well.  Today, denies symptoms of palpitations, chest pain, shortness of breath, orthopnea, PND, lower extremity edema, claudication, dizziness, presyncope, syncope, bleeding, or neurologic sequela. The patient is tolerating medications without difficulties.  Unfortunately he did go back into atrial fibrillation.  He has symptoms of weakness and fatigue.  He says that he was sitting on his couch reading when his watch alerted him to atrial fibrillation.  Aside from that he is doing well without major complaints.  Past Medical History:  Diagnosis Date  . Arthritis   . Bronchitis 05/23/2018  . CAD (coronary artery disease) 02/24/2018   LHC 10/19: pLAD 50, mLAD 50 (dFFR=0.89), oLCx 95, pRCA 30, mRCA 80 >> PCI: DES to LCx; DES to RCA // Xience28 Trial participant (Roshawn Lacina DC Plavix after 30 d and remain on ASA + Apixaban)  . Chest pain secondary to ablation, with expected mild  elevation in troponin 05/22/2018  . Complication of anesthesia    BP dropped post colonoscopy  . Hypercholesteremia   . Hypertension   . Lung nodules 02/25/2019   Chest CT 01/2019: LUL nodule unchanged (benign); new LUL nodules 6 mm, aortic atherosclerosis, emphysema, partially calcified pleural plaques suggesting prior empyema or hemothorax  . Persistent atrial fibrillation (HCC)   . Sleep apnea    does use a cpap  . Wears glasses    Past Surgical History:  Procedure Laterality Date  . ATRIAL FIBRILLATION ABLATION N/A 05/20/2018   Procedure: ATRIAL FIBRILLATION ABLATION;  Surgeon: Regan Lemming, MD;  Location: [facility] IN[facility]ASIVE [facility];  Service: Cardiovascular;  Laterality: N/A;  . CARDIOVERSION N/A 11/01/2015   Procedure: CARDIOVERSION;  Surgeon: [name], MD;  Location: [facility] ENDOSCOPY;  Service: Cardiovascular;  Laterality: N/A;  . CARDIOVERSION N/A 11/23/2015   Procedure: CARDIOVERSION;  Surgeon: Tatyanna Cronk [name], MD;  Location: [facility] ENDOSCOPY;  Service: Cardiovascular;  Laterality: N/A;  . CARDIOVERSION N/A 10/12/2017   Procedure: CARDIOVERSION;  Surgeon: [name], MD;  Location: [facility] ENDOSCOPY;  Service: Cardiovascular;  Laterality: N/A;  . CARDIOVERSION N/A 06/30/2018   Procedure: CARDIOVERSION;  Surgeon: Jake Bathe, MD;  Location: [facility] ENDOSCOPY;  Service: Cardiovascular;  Laterality: N/A;  . CARDIOVERSION N/A 09/22/2018   Procedure: CARDIOVERSION;  Surgeon: Quintella Reichert, MD;  Location: [facility] ENDOSCOPY;  Service: Cardiovascular;  Laterality: N/A;  . COLONOSCOPY    . CORONARY STENT INTERVENTION N/A 02/24/2018   Procedure: CORONARY STENT INTERVENTION;  Surgeon: Tonny Bollman, MD;  Location: [facility] CV LAB;  Service: Cardiovascular;  Laterality: N/A;  . INTRAVASCULAR PRESSURE WIRE/FFR STUDY N/A 02/24/2018   Procedure: INTRAVASCULAR PRESSURE WIRE/FFR STUDY;  Surgeon: Tonny Bollman, MD;  Location: [facility] INVASIVE CV LAB;  Service: Cardiovascular;  Laterality: N/A;  . KNEE  ARTHROSCOPY  2000  . LEFT HEART CATH AND CORONARY ANGIOGRAPHY N/A 02/24/2018   Procedure: LEFT HEART CATH AND CORONARY ANGIOGRAPHY;  Surgeon: Tonny Bollman, MD;  Location: [facility] INVASIVE CV LAB;  Service: Cardiovascular;  Laterality: N/A;  . SHOULDER ARTHROSCOPY WITH ROTATOR CUFF REPAIR AND SUBACROMIAL DECOMPRESSION Right 07/15/2012   Procedure: RIGHT ARTHROSCOPY SHOULDER DECOMPRESSION  SUBACROMIAL PARTIAL ACROMIOPLASTY WITH CORACOACROMIAL RELEASE,  DISTAL CLAVICULECTOMY, resect biceps debride labrium WITH ROTATOR CUFF REPAIR;  Surgeon: [name], MD;  Location: [facility];  Service: Orthopedics;  Laterality: Right;     Current Outpatient Medications  Medication Sig Dispense Refill  . acetaminophen (TYLENOL) 325 MG tablet Take 325 mg by mouth every 6 (six) hours as needed (for pain.).     Marland Kitchen amiodarone (PACERONE) 200 MG tablet Take 1 tablet (200 mg total) by mouth daily. 90 tablet 2  . ASPIRIN LOW DOSE 81 MG chewable tablet Chew 81 mg by mouth every evening.   0  . atorvastatin (LIPITOR) 40 MG tablet TAKE 1 TABLET BY MOUTH DAILY 90 tablet 1  . ELIQUIS 5 MG TABS tablet TAKE 1 TABLET(5 MG) BY MOUTH TWICE DAILY 60 tablet 5  . losartan (COZAAR) 25 MG tablet Take 1 tablet (25 mg total) by mouth daily. 90 tablet 3  . metoprolol tartrate (LOPRESSOR) 25 MG tablet Take 25 mg by mouth 2 (two) times daily.    . Multiple Vitamin (MULTIVITAMIN WITH MINERALS) TABS tablet Take 1 tablet by mouth 4 (four) times a week.    . nitroGLYCERIN (NITROSTAT) 0.4 MG SL tablet PLACE ONE TABLET UNDER TONGUE AS NEEDED FOR CHEST PAIN EVERY 5 MINUTES AS NEEDED 25 tablet 2  . zaleplon (SONATA) 5 MG capsule Take 5 mg by mouth at bedtime as needed for sleep.     No current facility-administered medications for this visit.    Allergies:   Lisinopril   Social History:  The patient  reports that he quit smoking about 46 years ago. His smoking use included cigarettes. He has a 20.00 pack-year smoking history.  He has never used smokeless tobacco. He reports current alcohol use of about 2.0 standard drinks of alcohol per week. He reports that he does not use drugs.   Family History:  The patient's family history includes Cancer in his sister; Clotting disorder in his mother; Heart disease in his brother; Heart failure in his father; Hypertension in his sister; Other in his mother.    ROS:  Please see the history of present illness.   Otherwise, review of systems is positive for none.   All other systems are reviewed and negative.   PHYSICAL EXAM: VS:  BP 136/84   Pulse 79   Ht 5\' 11"  (1.803 m)   Wt 276 lb 9.6 oz (125.5 kg)   SpO2 97%   BMI 38.58 kg/m  , BMI Body mass index is 38.58 kg/m. GEN: Well nourished, well developed, in no acute distress  HEENT: normal  Neck: no JVD, carotid bruits, or masses Cardiac: Irregular, no murmurs, rubs, or gallops,no edema  Respiratory:  clear to auscultation bilaterally, normal work of breathing GI: soft, nontender, nondistended, + BS MS: no deformity or atrophy  Skin: warm and dry Neuro:  Strength  and sensation are intact Psych: euthymic mood, full affect  EKG:  EKG is ordered today. Personal review of the ekg ordered shows atrial fibrillation, rate 79  Recent Labs: 09/16/2018: ALT 25; Hemoglobin 14.8; Platelets 171; TSH 4.382 02/16/2019: BUN 16; Creatinine, Ser 1.08; Potassium 4.7; Sodium 136    Lipid Panel     Component Value Date/Time   CHOL 96 (L) 04/09/2018 0959   TRIG 77 04/09/2018 0959   HDL 31 (L) 04/09/2018 0959   CHOLHDL 3.1 04/09/2018 0959   LDLCALC 50 04/09/2018 0959     Wt Readings from Last 3 Encounters:  07/12/19 276 lb 9.6 oz (125.5 kg)  04/05/19 278 lb (126.1 kg)  03/16/19 280 lb 3.2 oz (127.1 kg)      Other studies Reviewed: Additional studies/ records that were reviewed today include: TTE 09/2015 - Left ventricle: The cavity size was normal. Wall thickness was   increased in a pattern of moderate LVH. Systolic  function was   normal. The estimated ejection fraction was in the range of 55%   to 60%. Indeterminant diastolic function. Wall motion was normal;   there were no regional wall motion abnormalities. - Aortic valve: There was no stenosis. There was trivial   regurgitation. - Mitral valve: Mildly calcified annulus. There was mild   regurgitation. - Left atrium: The atrium was mildly dilated. - Right ventricle: The cavity size was normal. Systolic function   was normal. - Right atrium: The atrium was mildly dilated. - Tricuspid valve: Peak RV-RA gradient (S): 26 mm Hg. - Pulmonary arteries: PA peak pressure: 29 mm Hg (S). - Inferior vena cava: The vessel was normal in size. The   respirophasic diameter changes were in the normal range (= 50%),   consistent with normal central venous pressure.  LHC 02/24/18  Mid RCA lesion is 80% stenosed.  Prox RCA lesion is 30% stenosed.  Ost Cx to Prox Cx lesion is 95% stenosed.  Prox LAD lesion is 50% stenosed.  Prox LAD to Mid LAD lesion is 50% stenosed.  A drug-eluting stent was successfully placed using a STENT SIERRA 3.50 X 15 MM.  Post intervention, there is a 0% residual stenosis.  A drug-eluting stent was successfully placed using a STENT SIERRA 4.00 X 15 MM.  Post intervention, there is a 0% residual stenosis.   ASSESSMENT AND PLAN:  1.  Persistent atrial fibrillation: Currently on Eliquis.  Is failed Multaq.  Status post AF ablation 05/20/2018.  Currently on amiodarone.  CHA2DS2-VASc of 2.  He unfortunately has gone back into atrial fibrillation.  At this point he would prefer to have a cardioversion.  If he goes back into atrial fibrillation quickly, he is okay with ablation at that time.  We Ellamarie Naeve arrange him for follow-up in 3 months.   2. Hypertension: Mildly elevated today.  He is increase his metoprolol dose which is improved his blood pressure.  No changes.  3. OSA: CPAP compliance encouraged  4.  Morbid obesity: Diet and  exercise encouraged  5.  Coronary artery disease: Currently on aspirin and Eliquis.  Status post circumflex and RCA stents.  Current medicines are reviewed at length with the patient today.   The patient does not have concerns regarding his medicines.  The following changes were made today: None  Labs/ tests ordered today include:  Orders Placed This Encounter  Procedures  . Basic metabolic panel  . CBC  . EKG 12-Lead    Disposition:   FU with Akyra Bouchie 3  months  Signed, Kalesha Irving [name], MD  07/12/2019 10:21 AM     [facility] [address] [place] [address] [phone] (office) [phone] (fax)

## 2019-07-12 NOTE — H&P (View-Only) (Signed)
 Electrophysiology Office Note   Date:  07/12/2019   ID:  Ronald Hull, DOB 03/27/1946, MRN [id number]  PCP:  Polite, Ronald, MD   Primary Electrophysiologist:  Laylynn Campanella Martin Nickalous Stingley, MD    No chief complaint on file.    History of Present Illness: Ronald Hull is a 74 y.o. male who presents today for electrophysiology evaluation.   He presented to his primary physician on 10/02/15 with an elevated and irregular heart rate. Cardioversion was attempted and was unsuccessful with repeat cardioversion successfully to 360 J. He was initially placed on amiodarone and has since been switched to Multaq. He went back into atrial fibrillation and had cardioversion 10/12/17.  He was scheduled for ablation, but on his pre-ablation CT scan was found to have coronary artery disease.  He was sent to catheterization and had a drug-eluting stent to his circumflex and RCA on 02/24/2018.  He had A. fib ablation January 2020.  He unfortunately went back into atrial fibrillation 2 times.  He has since been put on amiodarone and has done well.  Today, denies symptoms of palpitations, chest pain, shortness of breath, orthopnea, PND, lower extremity edema, claudication, dizziness, presyncope, syncope, bleeding, or neurologic sequela. The patient is tolerating medications without difficulties.  Unfortunately he did go back into atrial fibrillation.  He has symptoms of weakness and fatigue.  He says that he was sitting on his couch reading when his watch alerted him to atrial fibrillation.  Aside from that he is doing well without major complaints.  Past Medical History:  Diagnosis Date  . Arthritis   . Bronchitis 05/23/2018  . CAD (coronary artery disease) 02/24/2018   LHC 10/19: pLAD 50, mLAD 50 (dFFR=0.89), oLCx 95, pRCA 30, mRCA 80 >> PCI: DES to LCx; DES to RCA // Xience28 Trial participant (Eline Geng DC Plavix after 30 d and remain on ASA + Apixaban)  . Chest pain secondary to ablation, with expected mild  elevation in troponin 05/22/2018  . Complication of anesthesia    BP dropped post colonoscopy  . Hypercholesteremia   . Hypertension   . Lung nodules 02/25/2019   Chest CT 01/2019: LUL nodule unchanged (benign); new LUL nodules 6 mm, aortic atherosclerosis, emphysema, partially calcified pleural plaques suggesting prior empyema or hemothorax  . Persistent atrial fibrillation (HCC)   . Sleep apnea    does use a cpap  . Wears glasses    Past Surgical History:  Procedure Laterality Date  . ATRIAL FIBRILLATION ABLATION N/A 05/20/2018   Procedure: ATRIAL FIBRILLATION ABLATION;  Surgeon: Sharell Hilmer Martin, MD;  Location: [facility] INVASIVE CV LAB;  Service: Cardiovascular;  Laterality: N/A;  . CARDIOVERSION N/A 11/01/2015   Procedure: CARDIOVERSION;  Surgeon: Kenneth C Hilty, MD;  Location: [facility] ENDOSCOPY;  Service: Cardiovascular;  Laterality: N/A;  . CARDIOVERSION N/A 11/23/2015   Procedure: CARDIOVERSION;  Surgeon: Orhan Mayorga Martin Kelsie Zaborowski, MD;  Location: [facility] ENDOSCOPY;  Service: Cardiovascular;  Laterality: N/A;  . CARDIOVERSION N/A 10/12/2017   Procedure: CARDIOVERSION;  Surgeon: Hilty, Kenneth C, MD;  Location: [facility] ENDOSCOPY;  Service: Cardiovascular;  Laterality: N/A;  . CARDIOVERSION N/A 06/30/2018   Procedure: CARDIOVERSION;  Surgeon: Skains, Mark C, MD;  Location: [facility] ENDOSCOPY;  Service: Cardiovascular;  Laterality: N/A;  . CARDIOVERSION N/A 09/22/2018   Procedure: CARDIOVERSION;  Surgeon: Turner, Traci R, MD;  Location: [facility] ENDOSCOPY;  Service: Cardiovascular;  Laterality: N/A;  . COLONOSCOPY    . CORONARY STENT INTERVENTION N/A 02/24/2018   Procedure: CORONARY STENT INTERVENTION;  Surgeon: Cooper, Michael, MD;    Location: [facility]VASIVE CV LAB;  Service: Cardiovascular;  Laterality: N/A;  . INTRAVASCULAR PRESSURE WIRE/FFR STUDY N/A 02/24/2018   Procedure: INTRAVASCULAR PRESSURE WIRE/FFR STUDY;  Surgeon: Tonny Bollman, MD;  Location: [facility] INVASIVE CV LAB;  Service: Cardiovascular;  Laterality: N/A;  . KNEE  ARTHROSCOPY  2000  . LEFT HEART CATH AND CORONARY ANGIOGRAPHY N/A 02/24/2018   Procedure: LEFT HEART CATH AND CORONARY ANGIOGRAPHY;  Surgeon: Tonny Bollman, MD;  Location: [facility] INVASIVE CV LAB;  Service: Cardiovascular;  Laterality: N/A;  . SHOULDER ARTHROSCOPY WITH ROTATOR CUFF REPAIR AND SUBACROMIAL DECOMPRESSION Right 07/15/2012   Procedure: RIGHT ARTHROSCOPY SHOULDER DECOMPRESSION  SUBACROMIAL PARTIAL ACROMIOPLASTY WITH CORACOACROMIAL RELEASE,  DISTAL CLAVICULECTOMY, resect biceps debride labrium WITH ROTATOR CUFF REPAIR;  Surgeon: [name], MD;  Location: [facility];  Service: Orthopedics;  Laterality: Right;     Current Outpatient Medications  Medication Sig Dispense Refill  . acetaminophen (TYLENOL) 325 MG tablet Take 325 mg by mouth every 6 (six) hours as needed (for pain.).     Marland Kitchen amiodarone (PACERONE) 200 MG tablet Take 1 tablet (200 mg total) by mouth daily. 90 tablet 2  . ASPIRIN LOW DOSE 81 MG chewable tablet Chew 81 mg by mouth every evening.   0  . atorvastatin (LIPITOR) 40 MG tablet TAKE 1 TABLET BY MOUTH DAILY 90 tablet 1  . ELIQUIS 5 MG TABS tablet TAKE 1 TABLET(5 MG) BY MOUTH TWICE DAILY 60 tablet 5  . losartan (COZAAR) 25 MG tablet Take 1 tablet (25 mg total) by mouth daily. 90 tablet 3  . metoprolol tartrate (LOPRESSOR) 25 MG tablet Take 25 mg by mouth 2 (two) times daily.    . Multiple Vitamin (MULTIVITAMIN WITH MINERALS) TABS tablet Take 1 tablet by mouth 4 (four) times a week.    . nitroGLYCERIN (NITROSTAT) 0.4 MG SL tablet PLACE ONE TABLET UNDER TONGUE AS NEEDED FOR CHEST PAIN EVERY 5 MINUTES AS NEEDED 25 tablet 2  . zaleplon (SONATA) 5 MG capsule Take 5 mg by mouth at bedtime as needed for sleep.     No current facility-administered medications for this visit.    Allergies:   Lisinopril   Social History:  The patient  reports that he quit smoking about 46 years ago. His smoking use included cigarettes. He has a 20.00 pack-year smoking history.  He has never used smokeless tobacco. He reports current alcohol use of about 2.0 standard drinks of alcohol per week. He reports that he does not use drugs.   Family History:  The patient's family history includes Cancer in his sister; Clotting disorder in his mother; Heart disease in his brother; Heart failure in his father; Hypertension in his sister; Other in his mother.    ROS:  Please see the history of present illness.   Otherwise, review of systems is positive for none.   All other systems are reviewed and negative.   PHYSICAL EXAM: VS:  BP 136/84   Pulse 79   Ht 5\' 11"  (1.803 m)   Wt 276 lb 9.6 oz (125.5 kg)   SpO2 97%   BMI 38.58 kg/m  , BMI Body mass index is 38.58 kg/m. GEN: Well nourished, well developed, in no acute distress  HEENT: normal  Neck: no JVD, carotid bruits, or masses Cardiac: Irregular, no murmurs, rubs, or gallops,no edema  Respiratory:  clear to auscultation bilaterally, normal work of breathing GI: soft, nontender, nondistended, + BS MS: no deformity or atrophy  Skin: warm and dry Neuro:  Strength  and sensation are intact Psych: euthymic mood, full affect  EKG:  EKG is ordered today. Personal review of the ekg ordered shows atrial fibrillation, rate 79  Recent Labs: 09/16/2018: ALT 25; Hemoglobin 14.8; Platelets 171; TSH 4.382 02/16/2019: BUN 16; Creatinine, Ser 1.08; Potassium 4.7; Sodium 136    Lipid Panel     Component Value Date/Time   CHOL 96 (L) 04/09/2018 0959   TRIG 77 04/09/2018 0959   HDL 31 (L) 04/09/2018 0959   CHOLHDL 3.1 04/09/2018 0959   LDLCALC 50 04/09/2018 0959     Wt Readings from Last 3 Encounters:  07/12/19 276 lb 9.6 oz (125.5 kg)  04/05/19 278 lb (126.1 kg)  03/16/19 280 lb 3.2 oz (127.1 kg)      Other studies Reviewed: Additional studies/ records that were reviewed today include: TTE 09/2015 - Left ventricle: The cavity size was normal. Wall thickness was   increased in a pattern of moderate LVH. Systolic  function was   normal. The estimated ejection fraction was in the range of 55%   to 60%. Indeterminant diastolic function. Wall motion was normal;   there were no regional wall motion abnormalities. - Aortic valve: There was no stenosis. There was trivial   regurgitation. - Mitral valve: Mildly calcified annulus. There was mild   regurgitation. - Left atrium: The atrium was mildly dilated. - Right ventricle: The cavity size was normal. Systolic function   was normal. - Right atrium: The atrium was mildly dilated. - Tricuspid valve: Peak RV-RA gradient (S): 26 mm Hg. - Pulmonary arteries: PA peak pressure: 29 mm Hg (S). - Inferior vena cava: The vessel was normal in size. The   respirophasic diameter changes were in the normal range (= 50%),   consistent with normal central venous pressure.  LHC 02/24/18  Mid RCA lesion is 80% stenosed.  Prox RCA lesion is 30% stenosed.  Ost Cx to Prox Cx lesion is 95% stenosed.  Prox LAD lesion is 50% stenosed.  Prox LAD to Mid LAD lesion is 50% stenosed.  A drug-eluting stent was successfully placed using a STENT SIERRA 3.50 X 15 MM.  Post intervention, there is a 0% residual stenosis.  A drug-eluting stent was successfully placed using a STENT SIERRA 4.00 X 15 MM.  Post intervention, there is a 0% residual stenosis.   ASSESSMENT AND PLAN:  1.  Persistent atrial fibrillation: Currently on Eliquis.  Is failed Multaq.  Status post AF ablation 05/20/2018.  Currently on amiodarone.  CHA2DS2-VASc of 2.  He unfortunately has gone back into atrial fibrillation.  At this point he would prefer to have a cardioversion.  If he goes back into atrial fibrillation quickly, he is okay with ablation at that time.  We Riki Gehring arrange him for follow-up in 3 months.   2. Hypertension: Mildly elevated today.  He is increase his metoprolol dose which is improved his blood pressure.  No changes.  3. OSA: CPAP compliance encouraged  4.  Morbid obesity: Diet and  exercise encouraged  5.  Coronary artery disease: Currently on aspirin and Eliquis.  Status post circumflex and RCA stents.  Current medicines are reviewed at length with the patient today.   The patient does not have concerns regarding his medicines.  The following changes were made today: None  Labs/ tests ordered today include:  Orders Placed This Encounter  Procedures  . Basic metabolic panel  . CBC  . EKG 12-Lead    Disposition:   FU with Kier Smead 3  months  Signed, Emiley Digiacomo [name], MD  07/12/2019 10:21 AM     [facility] [address] [place] [address] [phone] (office) [phone] (fax)

## 2019-07-13 ENCOUNTER — Other Ambulatory Visit (HOSPITAL_COMMUNITY)
Admission: RE | Admit: 2019-07-13 | Discharge: 2019-07-13 | Disposition: A | Discharge status: Home / Self Care | Payer: Medicare Other | Source: Ambulatory Visit | Attending: Cardiology | Admitting: Cardiology

## 2019-07-13 DIAGNOSIS — Z01812 Encounter for preprocedural laboratory examination: Secondary | ICD-10-CM | POA: Insufficient documentation

## 2019-07-13 DIAGNOSIS — Z20822 Contact with and (suspected) exposure to covid-19: Secondary | ICD-10-CM | POA: Insufficient documentation

## 2019-07-13 LAB — SARS CORONAVIRUS 2 (TAT 6-24 HRS): SARS Coronavirus 2: NEGATIVE

## 2019-07-15 ENCOUNTER — Ambulatory Visit (HOSPITAL_COMMUNITY)
Admission: RE | Admit: 2019-07-15 | Discharge: 2019-07-15 | Disposition: A | Discharge status: Home / Self Care | Payer: Medicare Other | Attending: Cardiology | Admitting: Cardiology

## 2019-07-15 ENCOUNTER — Encounter (HOSPITAL_COMMUNITY): Payer: Self-pay | Admitting: Cardiology

## 2019-07-15 ENCOUNTER — Other Ambulatory Visit: Payer: Self-pay

## 2019-07-15 ENCOUNTER — Ambulatory Visit (HOSPITAL_COMMUNITY): Payer: Medicare Other | Admitting: Anesthesiology

## 2019-07-15 ENCOUNTER — Encounter (HOSPITAL_COMMUNITY)
Admission: RE | Disposition: A | Discharge status: Home / Self Care | Payer: Self-pay | Source: Home / Self Care | Attending: Cardiology

## 2019-07-15 DIAGNOSIS — Z79899 Other long term (current) drug therapy: Secondary | ICD-10-CM | POA: Diagnosis not present

## 2019-07-15 DIAGNOSIS — I4819 Other persistent atrial fibrillation: Secondary | ICD-10-CM | POA: Diagnosis not present

## 2019-07-15 DIAGNOSIS — Z7901 Long term (current) use of anticoagulants: Secondary | ICD-10-CM | POA: Insufficient documentation

## 2019-07-15 DIAGNOSIS — Z7982 Long term (current) use of aspirin: Secondary | ICD-10-CM | POA: Insufficient documentation

## 2019-07-15 DIAGNOSIS — I10 Essential (primary) hypertension: Secondary | ICD-10-CM | POA: Diagnosis not present

## 2019-07-15 DIAGNOSIS — Z888 Allergy status to other drugs, medicaments and biological substances status: Secondary | ICD-10-CM | POA: Insufficient documentation

## 2019-07-15 DIAGNOSIS — I2510 Atherosclerotic heart disease of native coronary artery without angina pectoris: Secondary | ICD-10-CM | POA: Insufficient documentation

## 2019-07-15 DIAGNOSIS — Z87891 Personal history of nicotine dependence: Secondary | ICD-10-CM | POA: Diagnosis not present

## 2019-07-15 DIAGNOSIS — M1990 Unspecified osteoarthritis, unspecified site: Secondary | ICD-10-CM | POA: Insufficient documentation

## 2019-07-15 DIAGNOSIS — Z955 Presence of coronary angioplasty implant and graft: Secondary | ICD-10-CM | POA: Insufficient documentation

## 2019-07-15 DIAGNOSIS — Z6838 Body mass index (BMI) 38.0-38.9, adult: Secondary | ICD-10-CM | POA: Diagnosis not present

## 2019-07-15 DIAGNOSIS — G4733 Obstructive sleep apnea (adult) (pediatric): Secondary | ICD-10-CM | POA: Diagnosis not present

## 2019-07-15 DIAGNOSIS — Z8249 Family history of ischemic heart disease and other diseases of the circulatory system: Secondary | ICD-10-CM | POA: Insufficient documentation

## 2019-07-15 DIAGNOSIS — E785 Hyperlipidemia, unspecified: Secondary | ICD-10-CM | POA: Diagnosis not present

## 2019-07-15 DIAGNOSIS — E7800 Pure hypercholesterolemia, unspecified: Secondary | ICD-10-CM | POA: Diagnosis not present

## 2019-07-15 DIAGNOSIS — I480 Paroxysmal atrial fibrillation: Secondary | ICD-10-CM | POA: Diagnosis not present

## 2019-07-15 HISTORY — PX: CARDIOVERSION: SHX1299

## 2019-07-15 SURGERY — CARDIOVERSION
Anesthesia: General

## 2019-07-15 MED ORDER — SODIUM CHLORIDE 0.9 % IV SOLN
INTRAVENOUS | Status: AC | PRN
  Administered 2019-07-15: 500 mL via INTRAVENOUS

## 2019-07-15 MED ORDER — LIDOCAINE 2% (20 MG/ML) 5 ML SYRINGE
INTRAMUSCULAR | Status: DC | PRN
  Administered 2019-07-15: 40 mg via INTRAVENOUS

## 2019-07-15 MED ORDER — PROPOFOL 10 MG/ML IV BOLUS
INTRAVENOUS | Status: DC | PRN
  Administered 2019-07-15: 30 mg via INTRAVENOUS
  Administered 2019-07-15: 70 mg via INTRAVENOUS
  Administered 2019-07-15: 50 mg via INTRAVENOUS

## 2019-07-15 NOTE — CV Procedure (Signed)
Procedure:   DCCV  Indication:  Symptomatic atrial fibrillation  Procedure Note:  The patient signed informed consent.  They have had had therapeutic anticoagulation with apixaban greater than 3 weeks.  Anesthesia was administered by Dr. Noreene Larsson.  Adequate airway was maintained throughout and vital followed per protocol.  They were cardioverted x 3 with 150, 200, 200J of biphasic synchronized energy.  They remained in atrial fibrillation.  There were no apparent complications.  The patient had normal neuro status and respiratory status post procedure with vitals stable as recorded elsewhere.  Patient received 150 mg propofol and 20 mg of lidocaine.  Follow up:  They will continue on current medical therapy and follow up with cardiology as scheduled.  Jodelle Red, MD PhD 07/15/2019 8:49 AM

## 2019-07-15 NOTE — Interval H&P Note (Signed)
History and Physical Interval Note:  07/15/2019 8:36 AM  Ronald Hull  has presented today for surgery, with the diagnosis of AFIB.  The various methods of treatment have been discussed with the patient and family. After consideration of risks, benefits and other options for treatment, the patient has consented to  Procedure(s): CARDIOVERSION (N/A) as a surgical intervention.  The patient's history has been reviewed, patient examined, no change in status, stable for surgery.  I have reviewed the patient's chart and labs.  Questions were answered to the patient's satisfaction.     [name]

## 2019-07-15 NOTE — Transfer of Care (Signed)
Immediate Anesthesia Transfer of Care Note  Patient: Ronald Hull  Procedure(s) Performed: CARDIOVERSION (N/A )  Patient Location: Endoscopy Unit  Anesthesia Type:General  Level of Consciousness: drowsy and responds to stimulation  Airway & Oxygen Therapy: Patient Spontanous Breathing  Post-op Assessment: Report given to RN and Post -op Vital signs reviewed and stable  Post vital signs: Reviewed and stable  Last Vitals:  Vitals Value Taken Time  BP 122/83 07/15/19 0851  Temp    Pulse 64 07/15/19 0851  Resp 18 07/15/19 0851  SpO2 100 % 07/15/19 0851    Last Pain:  Vitals:   07/15/19 0800  TempSrc: Oral  PainSc: 0-No pain         Complications: No apparent anesthesia complications

## 2019-07-15 NOTE — Discharge Instructions (Signed)
Electrical Cardioversion Electrical cardioversion is the delivery of a jolt of electricity to restore a normal rhythm to the heart. A rhythm that is too fast or is not regular keeps the heart from pumping well. In this procedure, sticky patches or metal paddles are placed on the chest to deliver electricity to the heart from a device. This procedure may be done in an emergency if:  There is low or no blood pressure as a result of the heart rhythm.  Normal rhythm must be restored as fast as possible to protect the brain and heart from further damage.  It may save a life. This may also be a scheduled procedure for irregular or fast heart rhythms that are not immediately life-threatening. Tell a health care provider about:  Any allergies you have.  All medicines you are taking, including vitamins, herbs, eye drops, creams, and over-the-counter medicines.  Any problems you or family members have had with anesthetic medicines.  Any blood disorders you have.  Any surgeries you have had.  Any medical conditions you have.  Whether you are pregnant or may be pregnant. What are the risks? Generally, this is a safe procedure. However, problems may occur, including:  Allergic reactions to medicines.  A blood clot that breaks free and travels to other parts of your body.  The possible return of an abnormal heart rhythm within hours or days after the procedure.  Your heart stopping (cardiac arrest). This is rare. What happens before the procedure? Medicines  Your health care provider may have you start taking: ? Blood-thinning medicines (anticoagulants) so your blood does not clot as easily. ? Medicines to help stabilize your heart rate and rhythm.  Ask your health care provider about: ? Changing or stopping your regular medicines. This is especially important if you are taking diabetes medicines or blood thinners. ? Taking medicines such as aspirin and ibuprofen. These medicines can  thin your blood. Do not take these medicines unless your health care provider tells you to take them. ? Taking over-the-counter medicines, vitamins, herbs, and supplements. General instructions  Follow instructions from your health care provider about eating or drinking restrictions.  Plan to have someone take you home from the hospital or clinic.  If you will be going home right after the procedure, plan to have someone with you for 24 hours.  Ask your health care provider what steps will be taken to help prevent infection. These may include washing your skin with a germ-killing soap. What happens during the procedure?   An IV will be inserted into one of your veins.  Sticky patches (electrodes) or metal paddles may be placed on your chest.  You will be given a medicine to help you relax (sedative).  An electrical shock will be delivered. The procedure may vary among health care providers and hospitals. What can I expect after the procedure?  Your blood pressure, heart rate, breathing rate, and blood oxygen level will be monitored until you leave the hospital or clinic.  Your heart rhythm will be watched to make sure it does not change.  You may have some redness on the skin where the shocks were given. Follow these instructions at home:  Do not drive for 24 hours if you were given a sedative during your procedure.  Take over-the-counter and prescription medicines only as told by your health care provider.  Ask your health care provider how to check your pulse. Check it often.  Rest for 48 hours after the procedure or   as told by your health care provider.  Avoid or limit your caffeine use as told by your health care provider.  Keep all follow-up visits as told by your health care provider. This is important. Contact a health care provider if:  You feel like your heart is beating too quickly or your pulse is not regular.  You have a serious muscle cramp that does not go  away. Get help right away if:  You have discomfort in your chest.  You are dizzy or you feel faint.  You have trouble breathing or you are short of breath.  Your speech is slurred.  You have trouble moving an arm or leg on one side of your body.  Your fingers or toes turn cold or blue. Summary  Electrical cardioversion is the delivery of a jolt of electricity to restore a normal rhythm to the heart.  This procedure may be done right away in an emergency or may be a scheduled procedure if the condition is not an emergency.  Generally, this is a safe procedure.  After the procedure, check your pulse often as told by your health care provider. This information is not intended to replace advice given to you by your health care provider. Make sure you discuss any questions you have with your health care provider. Document Revised: 11/15/2018 Document Reviewed: 11/15/2018 Elsevier Patient Education  2020 [employer]  

## 2019-07-15 NOTE — Anesthesia Preprocedure Evaluation (Addendum)
Anesthesia Evaluation  Patient identified by MRN, date of birth, ID band Patient awake    Reviewed: Allergy & Precautions, NPO status , Patient's Chart, lab work & pertinent test results  Airway Mallampati: III  TM Distance: >3 FB Neck ROM: Full    Dental  (+) Teeth Intact, Dental Advisory Given   Pulmonary former smoker,    breath sounds clear to auscultation       Cardiovascular hypertension,  Rhythm:Regular Rate:Normal     Neuro/Psych    GI/Hepatic   Endo/Other    Renal/GU      Musculoskeletal   Abdominal (+) + obese,   Peds  Hematology   Anesthesia Other Findings   Reproductive/Obstetrics                             Anesthesia Physical Anesthesia Plan  ASA: III  Anesthesia Plan: General   Post-op Pain Management:    Induction: Intravenous  PONV Risk Score and Plan: Ondansetron  Airway Management Planned: Mask  Additional Equipment:   Intra-op Plan:   Post-operative Plan:   Informed Consent: I have reviewed the patients History and Physical, chart, labs and discussed the procedure including the risks, benefits and alternatives for the proposed anesthesia with the patient or authorized representative who has indicated his/her understanding and acceptance.     Dental advisory given  Plan Discussed with: CRNA and Anesthesiologist  Anesthesia Plan Comments:         Anesthesia Quick Evaluation

## 2019-07-17 NOTE — Anesthesia Postprocedure Evaluation (Signed)
Anesthesia Post Note  Patient: Ronald Hull  Procedure(s) Performed: CARDIOVERSION (N/A )     Patient location during evaluation: Endoscopy Anesthesia Type: General Level of consciousness: awake and alert Pain management: pain level controlled Vital Signs Assessment: post-procedure vital signs reviewed and stable Respiratory status: spontaneous breathing, nonlabored ventilation, respiratory function stable and patient connected to nasal cannula oxygen Cardiovascular status: blood pressure returned to baseline and stable Postop Assessment: no apparent nausea or vomiting Anesthetic complications: no    Last Vitals:  Vitals:   07/15/19 0905 07/15/19 0915  BP: 126/74 125/77  Pulse: (!) 40 65  Resp: 20 20  Temp:    SpO2: 97% 99%    Last Pain:  Vitals:   07/15/19 0915  TempSrc:   PainSc: 0-No pain                 Zlatan Hornback COKER

## 2019-07-28 DIAGNOSIS — I4891 Unspecified atrial fibrillation: Secondary | ICD-10-CM | POA: Diagnosis not present

## 2019-07-28 DIAGNOSIS — Z0000 Encounter for general adult medical examination without abnormal findings: Secondary | ICD-10-CM | POA: Diagnosis not present

## 2019-07-28 DIAGNOSIS — E7800 Pure hypercholesterolemia, unspecified: Secondary | ICD-10-CM | POA: Diagnosis not present

## 2019-07-28 DIAGNOSIS — Z1389 Encounter for screening for other disorder: Secondary | ICD-10-CM | POA: Diagnosis not present

## 2019-08-11 ENCOUNTER — Other Ambulatory Visit: Payer: Self-pay | Admitting: Cardiovascular Disease

## 2019-08-12 ENCOUNTER — Telehealth: Payer: Self-pay | Admitting: *Deleted

## 2019-08-12 DIAGNOSIS — I4891 Unspecified atrial fibrillation: Secondary | ICD-10-CM

## 2019-08-12 NOTE — Telephone Encounter (Signed)
lmtcb to schedule ablation

## 2019-08-19 NOTE — Telephone Encounter (Signed)
Spoke to pt's wife. Informed of ablation date 5/7.  She thinks he will want.  Attempted pt on cell, per wife instructions, but voicemail. Left message I would call back to see if he wants that date.

## 2019-08-19 NOTE — Telephone Encounter (Signed)
Pt confirms he would like to proceed w/ repeat ablation 5/7. Covid screening scheduled for 5/5 CT & Ablation instructions sent via Mychart. Aware office will call to arrange post procedure follow up. Patient verbalized understanding and agreeable to plan.

## 2019-08-19 NOTE — Telephone Encounter (Signed)
Transferred call to Sherri 

## 2019-08-22 ENCOUNTER — Telehealth: Payer: Self-pay | Admitting: Critical Care Medicine

## 2019-08-22 NOTE — Telephone Encounter (Signed)
Unfortunately he has a cardiac Ct ordered and I have a full chest CT. The cardiac CT does not image all of the lungs, so it will not see everything we need.  LPC

## 2019-08-23 NOTE — Telephone Encounter (Signed)
Spoke with pt. He is aware of Dr. Ophelia Charter response. Nothing further is needed.

## 2019-08-23 NOTE — Telephone Encounter (Signed)
Pt messaging back via Mychart regarding CT.  Please contact patient to discuss.

## 2019-08-23 NOTE — Telephone Encounter (Signed)
LMTCB x1 for pt.  

## 2019-08-23 NOTE — Telephone Encounter (Signed)
Patient is returning phone call. Patient phone number is [phone].

## 2019-08-26 ENCOUNTER — Telehealth (HOSPITAL_COMMUNITY): Payer: Self-pay | Admitting: Physician Assistant

## 2019-08-26 NOTE — Telephone Encounter (Signed)
Called and left message for patient to call back.  Need to schedule appt with [name], PA for 4 wks after ablation on 09/02/19 per Dr. Elberta Fortis.

## 2019-08-29 ENCOUNTER — Telehealth: Payer: Self-pay | Admitting: Cardiology

## 2019-08-29 ENCOUNTER — Other Ambulatory Visit: Payer: Self-pay | Admitting: *Deleted

## 2019-08-29 ENCOUNTER — Other Ambulatory Visit: Payer: Self-pay

## 2019-08-29 ENCOUNTER — Other Ambulatory Visit: Payer: Medicare Other | Admitting: *Deleted

## 2019-08-29 ENCOUNTER — Telehealth (HOSPITAL_COMMUNITY): Payer: Self-pay | Admitting: Emergency Medicine

## 2019-08-29 ENCOUNTER — Ambulatory Visit (INDEPENDENT_AMBULATORY_CARE_PROVIDER_SITE_OTHER)
Admission: RE | Admit: 2019-08-29 | Discharge: 2019-08-29 | Disposition: A | Discharge status: Home / Self Care | Payer: Medicare Other | Source: Ambulatory Visit | Attending: Critical Care Medicine | Admitting: Critical Care Medicine

## 2019-08-29 DIAGNOSIS — R918 Other nonspecific abnormal finding of lung field: Secondary | ICD-10-CM

## 2019-08-29 DIAGNOSIS — I4819 Other persistent atrial fibrillation: Secondary | ICD-10-CM

## 2019-08-29 DIAGNOSIS — Z01812 Encounter for preprocedural laboratory examination: Secondary | ICD-10-CM

## 2019-08-29 LAB — BASIC METABOLIC PANEL
BUN/Creatinine Ratio: 19 (ref 10–24)
BUN: 21 mg/dL (ref 8–27)
CO2: 21 mmol/L (ref 20–29)
Calcium: 9.2 mg/dL (ref 8.6–10.2)
Chloride: 103 mmol/L (ref 96–106)
Creatinine, Ser: 1.11 mg/dL (ref 0.76–1.27)
GFR calc Af Amer: 75 mL/min/{1.73_m2} (ref >59)
GFR calc non Af Amer: 65 mL/min/{1.73_m2} (ref >59)
Glucose: 112 mg/dL — ABNORMAL HIGH (ref 65–99)
Potassium: 4.6 mmol/L (ref 3.5–5.2)
Sodium: 137 mmol/L (ref 134–144)

## 2019-08-29 LAB — CBC
Hematocrit: 46 % (ref 37.5–51.0)
Hemoglobin: 15.6 g/dL (ref 13.0–17.7)
MCH: 32.1 pg (ref 26.6–33.0)
MCHC: 33.9 g/dL (ref 31.5–35.7)
MCV: 95 fL (ref 79–97)
Platelets: 183 10*3/uL (ref 150–450)
RBC: 4.86 x10E6/uL (ref 4.14–5.80)
RDW: 14.3 % (ref 11.6–15.4)
WBC: 6.4 10*3/uL (ref 3.4–10.8)

## 2019-08-29 NOTE — Telephone Encounter (Signed)
CT scheduler scheduled CT tomorrow (per ablation CT) Pt will stop by the office today for pre procedure labs.

## 2019-08-29 NOTE — Telephone Encounter (Signed)
Reaching out to patient to offer assistance regarding upcoming cardiac imaging study; pt verbalizes understanding of appt date/time, parking situation and where to check in, pre-test NPO status and medications ordered, and verified current allergies; name and call back number provided for further questions should they arise Draydon Clairmont RN Navigator Cardiac Imaging [facility] [phone] office [phone] cell 

## 2019-08-29 NOTE — Telephone Encounter (Signed)
I received a call from patients spouse.  She stated she had gotten the VM I left on 4/30 and she would have her husband contact our office to schedule his f/u appt.

## 2019-08-29 NOTE — Telephone Encounter (Signed)
New message:     Patient calling concerning some was going to call him about CT scan. Please call patient back.

## 2019-08-29 NOTE — Progress Notes (Signed)
bmet  

## 2019-08-30 ENCOUNTER — Ambulatory Visit (HOSPITAL_COMMUNITY)
Admission: RE | Admit: 2019-08-30 | Discharge: 2019-08-30 | Disposition: A | Discharge status: Home / Self Care | Payer: Medicare Other | Source: Ambulatory Visit | Attending: Cardiology | Admitting: Cardiology

## 2019-08-30 DIAGNOSIS — I4891 Unspecified atrial fibrillation: Secondary | ICD-10-CM

## 2019-08-30 MED ORDER — IOHEXOL 350 MG/ML SOLN
80.0000 mL | Freq: Once | INTRAVENOUS | Status: AC | PRN
  Administered 2019-08-30: 80 mL via INTRAVENOUS

## 2019-08-31 ENCOUNTER — Other Ambulatory Visit (HOSPITAL_COMMUNITY)
Admission: RE | Admit: 2019-08-31 | Discharge: 2019-08-31 | Disposition: A | Discharge status: Home / Self Care | Payer: Medicare Other | Source: Ambulatory Visit | Attending: Cardiology | Admitting: Cardiology

## 2019-08-31 DIAGNOSIS — Z20822 Contact with and (suspected) exposure to covid-19: Secondary | ICD-10-CM | POA: Diagnosis not present

## 2019-08-31 DIAGNOSIS — Z01812 Encounter for preprocedural laboratory examination: Secondary | ICD-10-CM | POA: Diagnosis not present

## 2019-08-31 LAB — SARS CORONAVIRUS 2 (TAT 6-24 HRS): SARS Coronavirus 2: NEGATIVE

## 2019-08-31 NOTE — Progress Notes (Signed)
Please let Ronald Hull know that his CT scan shows his nodules are all the same size. He needs follow up CT scan in 18-24 months. Either he can have his PCP order this or he needs to be seen in Oct-Dec 2021 for [place] to order this. Thanks!  Steffanie Dunn, DO 08/31/19 6:39 PM [facility]

## 2019-09-02 ENCOUNTER — Other Ambulatory Visit: Payer: Self-pay

## 2019-09-02 ENCOUNTER — Encounter (HOSPITAL_COMMUNITY): Payer: Self-pay | Admitting: Cardiology

## 2019-09-02 ENCOUNTER — Ambulatory Visit (HOSPITAL_COMMUNITY): Payer: Medicare Other | Admitting: Certified Registered Nurse Anesthetist

## 2019-09-02 ENCOUNTER — Encounter (HOSPITAL_COMMUNITY)
Admission: RE | Disposition: A | Discharge status: Home / Self Care | Payer: Medicare Other | Source: Home / Self Care | Attending: Cardiology

## 2019-09-02 ENCOUNTER — Ambulatory Visit (HOSPITAL_COMMUNITY)
Admission: RE | Admit: 2019-09-02 | Discharge: 2019-09-02 | Disposition: A | Discharge status: Home / Self Care | Payer: Medicare Other | Attending: Cardiology | Admitting: Cardiology

## 2019-09-02 DIAGNOSIS — G4730 Sleep apnea, unspecified: Secondary | ICD-10-CM | POA: Insufficient documentation

## 2019-09-02 DIAGNOSIS — M1990 Unspecified osteoarthritis, unspecified site: Secondary | ICD-10-CM | POA: Diagnosis not present

## 2019-09-02 DIAGNOSIS — Z87891 Personal history of nicotine dependence: Secondary | ICD-10-CM | POA: Insufficient documentation

## 2019-09-02 DIAGNOSIS — I4819 Other persistent atrial fibrillation: Secondary | ICD-10-CM | POA: Insufficient documentation

## 2019-09-02 DIAGNOSIS — Z888 Allergy status to other drugs, medicaments and biological substances status: Secondary | ICD-10-CM | POA: Diagnosis not present

## 2019-09-02 DIAGNOSIS — I10 Essential (primary) hypertension: Secondary | ICD-10-CM | POA: Insufficient documentation

## 2019-09-02 DIAGNOSIS — Z955 Presence of coronary angioplasty implant and graft: Secondary | ICD-10-CM | POA: Insufficient documentation

## 2019-09-02 DIAGNOSIS — E7800 Pure hypercholesterolemia, unspecified: Secondary | ICD-10-CM | POA: Insufficient documentation

## 2019-09-02 DIAGNOSIS — I480 Paroxysmal atrial fibrillation: Secondary | ICD-10-CM | POA: Diagnosis not present

## 2019-09-02 DIAGNOSIS — Z8249 Family history of ischemic heart disease and other diseases of the circulatory system: Secondary | ICD-10-CM | POA: Insufficient documentation

## 2019-09-02 DIAGNOSIS — E785 Hyperlipidemia, unspecified: Secondary | ICD-10-CM | POA: Diagnosis not present

## 2019-09-02 DIAGNOSIS — I2510 Atherosclerotic heart disease of native coronary artery without angina pectoris: Secondary | ICD-10-CM | POA: Insufficient documentation

## 2019-09-02 HISTORY — PX: ATRIAL FIBRILLATION ABLATION: EP1191

## 2019-09-02 LAB — POCT ACTIVATED CLOTTING TIME
Activated Clotting Time: 257 seconds
Activated Clotting Time: 296 seconds

## 2019-09-02 SURGERY — ATRIAL FIBRILLATION ABLATION
Anesthesia: General

## 2019-09-02 MED ORDER — LIDOCAINE HCL (CARDIAC) PF 100 MG/5ML IV SOSY
PREFILLED_SYRINGE | INTRAVENOUS | Status: DC | PRN
  Administered 2019-09-02: 40 mg via INTRAVENOUS

## 2019-09-02 MED ORDER — SODIUM CHLORIDE 0.9% FLUSH: 3.0000 mL | INTRAVENOUS | Status: DC | PRN

## 2019-09-02 MED ORDER — HEPARIN SODIUM (PORCINE) 1000 UNIT/ML IJ SOLN
INTRAMUSCULAR | Status: DC | PRN
  Administered 2019-09-02: 1000 [IU] via INTRAVENOUS

## 2019-09-02 MED ORDER — HEPARIN (PORCINE) IN NACL 1000-0.9 UT/500ML-% IV SOLN
INTRAVENOUS | Status: AC
  Filled 2019-09-02: qty 500

## 2019-09-02 MED ORDER — ROCURONIUM BROMIDE 100 MG/10ML IV SOLN
INTRAVENOUS | Status: DC | PRN
  Administered 2019-09-02: 60 mg via INTRAVENOUS

## 2019-09-02 MED ORDER — PROTAMINE SULFATE 10 MG/ML IV SOLN
INTRAVENOUS | Status: DC | PRN
  Administered 2019-09-02: 30 mg via INTRAVENOUS
  Administered 2019-09-02: 20 mg via INTRAVENOUS

## 2019-09-02 MED ORDER — DEXAMETHASONE SODIUM PHOSPHATE 10 MG/ML IJ SOLN
INTRAMUSCULAR | Status: DC | PRN
  Administered 2019-09-02: 5 mg via INTRAVENOUS

## 2019-09-02 MED ORDER — ACETAMINOPHEN 325 MG PO TABS
650.0000 mg | ORAL_TABLET | ORAL | Status: DC | PRN
  Filled 2019-09-02: qty 2

## 2019-09-02 MED ORDER — ONDANSETRON HCL 4 MG/2ML IJ SOLN: 4.0000 mg | Freq: Four times a day (QID) | INTRAMUSCULAR | Status: DC | PRN

## 2019-09-02 MED ORDER — ONDANSETRON HCL 4 MG/2ML IJ SOLN
INTRAMUSCULAR | Status: DC | PRN
  Administered 2019-09-02: 4 mg via INTRAVENOUS

## 2019-09-02 MED ORDER — SODIUM CHLORIDE 0.9 % IV SOLN: 250.0000 mL | INTRAVENOUS | Status: DC | PRN

## 2019-09-02 MED ORDER — FENTANYL CITRATE (PF) 100 MCG/2ML IJ SOLN
INTRAMUSCULAR | Status: DC | PRN
  Administered 2019-09-02: 50 ug via INTRAVENOUS
  Administered 2019-09-02: 100 ug via INTRAVENOUS

## 2019-09-02 MED ORDER — HEPARIN SODIUM (PORCINE) 1000 UNIT/ML IJ SOLN
INTRAMUSCULAR | Status: AC
  Filled 2019-09-02: qty 1

## 2019-09-02 MED ORDER — HEPARIN SODIUM (PORCINE) 1000 UNIT/ML IJ SOLN
INTRAMUSCULAR | Status: DC | PRN
  Administered 2019-09-02: 15000 [IU] via INTRAVENOUS
  Administered 2019-09-02: 4000 [IU] via INTRAVENOUS
  Administered 2019-09-02: 6000 [IU] via INTRAVENOUS

## 2019-09-02 MED ORDER — SODIUM CHLORIDE 0.9% FLUSH: 3.0000 mL | Freq: Two times a day (BID) | INTRAVENOUS | Status: DC

## 2019-09-02 MED ORDER — HEPARIN SODIUM (PORCINE) 1000 UNIT/ML IJ SOLN
INTRAMUSCULAR | Status: AC
  Filled 2019-09-02: qty 2

## 2019-09-02 MED ORDER — HEPARIN (PORCINE) IN NACL 1000-0.9 UT/500ML-% IV SOLN
INTRAVENOUS | Status: DC | PRN
  Administered 2019-09-02 (×5): 500 mL

## 2019-09-02 MED ORDER — SUGAMMADEX SODIUM 200 MG/2ML IV SOLN
INTRAVENOUS | Status: DC | PRN
  Administered 2019-09-02: 200 mg via INTRAVENOUS

## 2019-09-02 MED ORDER — PROPOFOL 10 MG/ML IV BOLUS
INTRAVENOUS | Status: DC | PRN
  Administered 2019-09-02: 150 mg via INTRAVENOUS

## 2019-09-02 MED ORDER — MIDAZOLAM HCL 5 MG/5ML IJ SOLN
INTRAMUSCULAR | Status: DC | PRN
  Administered 2019-09-02: 2 mg via INTRAVENOUS

## 2019-09-02 MED ORDER — DOBUTAMINE IN D5W 4-5 MG/ML-% IV SOLN
INTRAVENOUS | Status: DC | PRN
  Administered 2019-09-02: 20 ug/kg/min via INTRAVENOUS

## 2019-09-02 MED ORDER — DOBUTAMINE IN D5W 4-5 MG/ML-% IV SOLN
INTRAVENOUS | Status: AC
  Filled 2019-09-02: qty 250

## 2019-09-02 MED ORDER — PHENYLEPHRINE HCL-NACL 10-0.9 MG/250ML-% IV SOLN
INTRAVENOUS | Status: DC | PRN
Start: 2019-09-02 — End: 2019-09-02
  Administered 2019-09-02: 40 ug/min via INTRAVENOUS

## 2019-09-02 MED ORDER — SODIUM CHLORIDE 0.9 % IV SOLN: INTRAVENOUS | Status: DC

## 2019-09-02 SURGICAL SUPPLY — 23 items
BAG SNAP BAND KOVER 36X36 (MISCELLANEOUS) ×1 IMPLANT
BLANKET WARM UNDERBOD FULL ACC (MISCELLANEOUS) ×2 IMPLANT
CATH MAPPNG PENTARAY F 2-6-2MM (CATHETERS) IMPLANT
CATH SMTCH THERMOCOOL SF DF (CATHETERS) ×1 IMPLANT
CATH SOUNDSTAR ECO 8FR (CATHETERS) ×1 IMPLANT
CATH WEBSTER BI DIR CS D-F CRV (CATHETERS) ×1 IMPLANT
COVER SWIFTLINK CONNECTOR (BAG) ×2 IMPLANT
DEVICE CLOSURE PERCLS PRGLD 6F (VASCULAR PRODUCTS) IMPLANT
PACK EP LATEX FREE (CUSTOM PROCEDURE TRAY) ×2
PACK EP LF (CUSTOM PROCEDURE TRAY) ×1 IMPLANT
PAD PRO RADIOLUCENT 2001M-C (PAD) ×2 IMPLANT
PATCH CARTO3 (PAD) ×1 IMPLANT
PENTARAY F 2-6-2MM (CATHETERS) ×2
PERCLOSE PROGLIDE 6F (VASCULAR PRODUCTS) ×8
SHEATH BAYLIS SUREFLEX  M 8.5 (SHEATH) ×1
SHEATH BAYLIS SUREFLEX M 8.5 (SHEATH) IMPLANT
SHEATH BAYLIS TRANSSEPTAL 98CM (NEEDLE) ×1 IMPLANT
SHEATH CARTO VIZIGO SM CVD (SHEATH) ×1 IMPLANT
SHEATH PINNACLE 7F 10CM (SHEATH) ×1 IMPLANT
SHEATH PINNACLE 8F 10CM (SHEATH) ×2 IMPLANT
SHEATH PINNACLE 9F 10CM (SHEATH) ×1 IMPLANT
SHEATH PROBE COVER 6X72 (BAG) ×1 IMPLANT
TUBING SMART ABLATE COOLFLOW (TUBING) ×1 IMPLANT

## 2019-09-02 NOTE — Anesthesia Procedure Notes (Signed)
Procedure Name: Intubation Date/Time: 09/02/2019 1:05 PM Performed by: Machi Whittaker T, CRNA Pre-anesthesia Checklist: Patient identified, Emergency Drugs available, Suction available and Patient being monitored Patient Re-evaluated:Patient Re-evaluated prior to induction Oxygen Delivery Method: Circle system utilized Preoxygenation: Pre-oxygenation with 100% oxygen Induction Type: IV induction Ventilation: Oral airway inserted - appropriate to patient size and Two handed mask ventilation required Laryngoscope Size: Mac and 4 Grade View: Grade II Tube type: Oral Tube size: 7.5 mm Number of attempts: 1 Airway Equipment and Method: Stylet and Oral airway Placement Confirmation: ETT inserted through vocal cords under direct vision,  positive ETCO2 and breath sounds checked- equal and bilateral Secured at: 22 cm Tube secured with: Tape Dental Injury: Teeth and Oropharynx as per pre-operative assessment

## 2019-09-02 NOTE — Transfer of Care (Signed)
Immediate Anesthesia Transfer of Care Note  Patient: Ronald Hull  Procedure(s) Performed: ATRIAL FIBRILLATION ABLATION (N/A )  Patient Location: PACU and Cath Lab  Anesthesia Type:General  Level of Consciousness: awake, alert  and oriented  Airway & Oxygen Therapy: Patient Spontanous Breathing and Patient connected to nasal cannula oxygen  Post-op Assessment: Report given to RN, Post -op Vital signs reviewed and stable and Patient moving all extremities  Post vital signs: Reviewed and stable  Last Vitals:  Vitals Value Taken Time  BP 148/66 09/02/19 1540  Temp 36.5 C 09/02/19 1537  Pulse 71 09/02/19 1542  Resp 19 09/02/19 1542  SpO2 99 % 09/02/19 1542  Vitals shown include unvalidated device data.  Last Pain:  Vitals:   09/02/19 1537  TempSrc: Temporal  PainSc: 0-No pain         Complications: No apparent anesthesia complications

## 2019-09-02 NOTE — H&P (Addendum)
Electrophysiology Office Note   Date:  09/02/2019   ID:  Ronald Hull, DOB 11-May-1945, MRN [id number]  PCP:  Seward Carol, MD   Primary Electrophysiologist:  Constance Haw, MD    No chief complaint on file.    History of Present Illness: Ronald Hull is a 74 y.o. male who presents today for electrophysiology evaluation.   He presented to his primary physician on 10/02/15 with an elevated and irregular heart rate. Cardioversion was attempted and was unsuccessful with repeat cardioversion successfully to 360 J. He was initially placed on amiodarone and has since been switched to Multaq. He went back into atrial fibrillation and had cardioversion 10/12/17.  He was scheduled for ablation, but on his pre-ablation CT scan was found to have coronary artery disease.  He was sent to catheterization and had a drug-eluting stent to his circumflex and RCA on 02/24/2018.  He had A. fib ablation January 2020.  He unfortunately went back into atrial fibrillation 2 times.  He has since been put on amiodarone and has done well.  Today, denies symptoms of palpitations, chest pain, shortness of breath, orthopnea, PND, lower extremity edema, claudication, dizziness, presyncope, syncope, bleeding, or neurologic sequela. The patient is tolerating medications without difficulties. Plan ablation.   Past Medical History:  Diagnosis Date  . Arthritis   . Bronchitis 05/23/2018  . CAD (coronary artery disease) 02/24/2018   LHC 10/19: pLAD 50, mLAD 50 (dFFR=0.89), oLCx 95, pRCA 30, mRCA 80 >> PCI: DES to LCx; DES to RCA // [facility] participant ( DC Plavix after 30 d and remain on ASA + Apixaban)  . Chest pain secondary to ablation, with expected mild elevation in troponin 05/22/2018  . Complication of anesthesia    BP dropped post colonoscopy  . Hypercholesteremia   . Hypertension   . Lung nodules 02/25/2019   Chest CT 01/2019: LUL nodule unchanged (benign); new LUL nodules 6 mm, aortic  atherosclerosis, emphysema, partially calcified pleural plaques suggesting prior empyema or hemothorax  . Persistent atrial fibrillation ([place])   . Sleep apnea    does use a cpap  . Wears glasses    Past Surgical History:  Procedure Laterality Date  . ATRIAL FIBRILLATION ABLATION N/A 05/20/2018   Procedure: ATRIAL FIBRILLATION ABLATION;  Surgeon: Constance Haw, MD;  Location: [place];  Service: Cardiovascular;  Laterality: N/A;  . CARDIOVERSION N/A 11/01/2015   Procedure: CARDIOVERSION;  Surgeon: Pixie Casino, MD;  Location[facility];  Service: Cardiovascular;  Laterality: N/A;  . CARDIOVERSION N/A 11/23/2015   Procedure: CARDIOVERSION;  Surgeon:  Meredith Leeds, MD;  Location: [facility];  Service: Cardiovascular;  Laterality: N/A;  . CARDIOVERSION N/A 10/12/2017   Procedure: CARDIOVERSION;  Surgeon: Pixie Casino, MD;  Location: [place];  Service: Cardiovascular;  Laterality: N/A;  . CARDIOVERSION N/A 06/30/2018   Procedure: CARDIOVERSION;  Surgeon: Jerline Pain, MD;  Location: [facility] ENDOSCOPY;  Service: Cardiovascular;  Laterality: N/A;  . CARDIOVERSION N/A 09/22/2018   Procedure: CARDIOVERSION;  Surgeon: Sueanne Margarita, MD;  Location: [facility] ENDOSCOPY;  Service: Cardiovascular;  Laterality: N/A;  . CARDIOVERSION N/A 07/15/2019   Procedure: CARDIOVERSION;  Surgeon: Buford Dresser, MD;  Location: [facility] ENDOSCOPY;  Service: Cardiovascular;  Laterality: N/A;  . COLONOSCOPY    . CORONARY STENT INTERVENTION N/A 02/24/2018   Procedure: CORONARY STENT INTERVENTION;  Surgeon: Sherren Mocha, MD;  Location: [place] [facility];  Service: Cardiovascular;  Laterality: N/A;  . INTRAVASCULAR PRESSURE WIRE/FFR STUDY N/A 02/24/2018  Procedure: INTRAVASCULAR PRESSURE WIRE/FFR STUDY;  Surgeon: Tonny Bollman, MD;  Location: [facility] INVASIVE CV LAB;  Service: Cardiovascular;  Laterality: N/A;  . KNEE ARTHROSCOPY  2000  . LEFT HEART CATH AND CORONARY ANGIOGRAPHY N/A 02/24/2018    Procedure: LEFT HEART CATH AND CORONARY ANGIOGRAPHY;  Surgeon: Tonny Bollman, MD;  Location: [facility] INVASIVE CV LAB;  Service: Cardiovascular;  Laterality: N/A;  . SHOULDER ARTHROSCOPY WITH ROTATOR CUFF REPAIR AND SUBACROMIAL DECOMPRESSION Right 07/15/2012   Procedure: RIGHT ARTHROSCOPY SHOULDER DECOMPRESSION  SUBACROMIAL PARTIAL ACROMIOPLASTY WITH CORACOACROMIAL RELEASE,  DISTAL CLAVICULECTOMY, resect biceps debride labrium WITH ROTATOR CUFF REPAIR;  Surgeon: [name], MD;  Location: [facility];  Service: Orthopedics;  Laterality: Right;     Current Facility-Administered Medications  Medication Dose Route Frequency Provider Last Rate Last Admin  . 0.9 %  sodium chloride infusion   Intravenous Continuous Regan Lemming, MD 50 mL/hr at 09/02/19 0920 New Bag at 09/02/19 0920    Allergies:   Lisinopril   Social History:  The patient  reports that he quit smoking about 46 years ago. His smoking use included cigarettes. He has a 20.00 pack-year smoking history. He has never used smokeless tobacco. He reports current alcohol use of about 2.0 standard drinks of alcohol per week. He reports that he does not use drugs.   Family History:  The patient's family history includes Cancer in his sister; Clotting disorder in his mother; Heart disease in his brother; Heart failure in his father; Hypertension in his sister; Other in his mother.    ROS:  Please see the history of present illness.   Otherwise, review of systems is positive for none.   All other systems are reviewed and negative.   PHYSICAL EXAM: VS:  BP (!) 151/97   Pulse 81   Temp (!) 97.3 F (36.3 C) (Skin)   Ht 5\' 11"  (1.803 m)   Wt 120.2 kg   SpO2 97%   BMI 36.96 kg/m  , BMI Body mass index is 36.96 kg/m. GEN: Well nourished, well developed, in no acute distress  HEENT: normal  Neck: no JVD, carotid bruits, or masses Cardiac: RRR; no murmurs, rubs, or gallops,no edema  Respiratory:  clear to auscultation  bilaterally, normal work of breathing GI: soft, nontender, nondistended, + BS MS: no deformity or atrophy  Skin: warm and dry,  Neuro:  Strength and sensation are intact Psych: euthymic mood, full affect  Recent Labs: 09/16/2018: ALT 25; TSH 4.382 08/29/2019: BUN 21; Creatinine, Ser 1.11; Hemoglobin 15.6; Platelets 183; Potassium 4.6; Sodium 137    Lipid Panel     Component Value Date/Time   CHOL 96 (L) 04/09/2018 0959   TRIG 77 04/09/2018 0959   HDL 31 (L) 04/09/2018 0959   CHOLHDL 3.1 04/09/2018 0959   LDLCALC 50 04/09/2018 0959     Wt Readings from Last 3 Encounters:  09/02/19 120.2 kg  07/15/19 122.5 kg  07/12/19 125.5 kg      Other studies Reviewed: Additional studies/ records that were reviewed today include: TTE 09/2015 - Left ventricle: The cavity size was normal. Wall thickness was   increased in a pattern of moderate LVH. Systolic function was   normal. The estimated ejection fraction was in the range of 55%   to 60%. Indeterminant diastolic function. Wall motion was normal;   there were no regional wall motion abnormalities. - Aortic valve: There was no stenosis. There was trivial   regurgitation. - Mitral valve: Mildly calcified annulus. There was  mild   regurgitation. - Left atrium: The atrium was mildly dilated. - Right ventricle: The cavity size was normal. Systolic function   was normal. - Right atrium: The atrium was mildly dilated. - Tricuspid valve: Peak RV-RA gradient (S): 26 mm Hg. - Pulmonary arteries: PA peak pressure: 29 mm Hg (S). - Inferior vena cava: The vessel was normal in size. The   respirophasic diameter changes were in the normal range (= 50%),   consistent with normal central venous pressure.  LHC 02/24/18  Mid RCA lesion is 80% stenosed.  Prox RCA lesion is 30% stenosed.  Ost Cx to Prox Cx lesion is 95% stenosed.  Prox LAD lesion is 50% stenosed.  Prox LAD to Mid LAD lesion is 50% stenosed.  A drug-eluting stent was  successfully placed using a STENT SIERRA 3.50 X 15 MM.  Post intervention, there is a 0% residual stenosis.  A drug-eluting stent was successfully placed using a STENT SIERRA 4.00 X 15 MM.  Post intervention, there is a 0% residual stenosis.   ASSESSMENT AND PLAN:  1.  Persistent atrial fibrillation: plan ablation today  Ronald Hull has presented today for surgery, with the diagnosis of atrial fibrillation.  The various methods of treatment have been discussed with the patient and family. After consideration of risks, benefits and other options for treatment, the patient has consented to  Procedure(s): Catheter ablation as a surgical intervention .  Risks include but not limited to bleeding, tamponade, heart block, stroke, damage to surrounding organs, among others. The patient's history has been reviewed, patient examined, no change in status, stable for surgery.  I have reviewed the patient's chart and labs.  Questions were answered to the patient's satisfaction.     Elberta Fortis, MD 09/02/2019 11:47 AM

## 2019-09-02 NOTE — Anesthesia Preprocedure Evaluation (Addendum)
Anesthesia Evaluation  Patient identified by MRN, date of birth, ID band Patient awake    Reviewed: Allergy & Precautions, NPO status , Patient's Chart, lab work & pertinent test results  Airway Mallampati: III  TM Distance: >3 FB Neck ROM: Full    Dental  (+) Teeth Intact, Dental Advisory Given   Pulmonary sleep apnea , former smoker,    breath sounds clear to auscultation       Cardiovascular hypertension, Pt. on home beta blockers and Pt. on medications + CAD   Rhythm:Irregular Rate:Normal     Neuro/Psych negative neurological ROS  negative psych ROS   GI/Hepatic negative GI ROS, Neg liver ROS,   Endo/Other  negative endocrine ROS  Renal/GU negative Renal ROS     Musculoskeletal   Abdominal (+) + obese,   Peds  Hematology negative hematology ROS (+)   Anesthesia Other Findings   Reproductive/Obstetrics                            Anesthesia Physical Anesthesia Plan  ASA: III  Anesthesia Plan: General   Post-op Pain Management:    Induction: Intravenous  PONV Risk Score and Plan: 1 and Ondansetron and Treatment may vary due to age or medical condition  Airway Management Planned: Oral ETT  Additional Equipment: None  Intra-op Plan:   Post-operative Plan: Extubation in OR  Informed Consent: I have reviewed the patients History and Physical, chart, labs and discussed the procedure including the risks, benefits and alternatives for the proposed anesthesia with the patient or authorized representative who has indicated his/her understanding and acceptance.     Dental advisory given  Plan Discussed with: CRNA  Anesthesia Plan Comments: (Echo:  - Left ventricle: The cavity size was normal. Wall thickness was  increased in a pattern of mild LVH. Systolic function was normal.  The estimated ejection fraction was in the range of 60% to 65%.  Wall motion was normal; there  were no regional wall motion  abnormalities. Doppler parameters are consistent with restrictive  physiology, indicative of decreased left ventricular diastolic  compliance and/or increased left atrial pressure. Doppler  parameters are consistent with high ventricular filling pressure.  - Aortic valve: Mildly calcified annulus. Trileaflet. There was  mild regurgitation.  - Mitral valve: Moderately calcified annulus. Normal thickness  leaflets . There was mild regurgitation.  - Tricuspid valve: There was mild regurgitation.  - Systemic veins: IVC poorly visualized but appears dilated. )      Anesthesia Quick Evaluation

## 2019-09-02 NOTE — Progress Notes (Signed)
Patient and wife was given discharge instructions. Both verbalized understanding. 

## 2019-09-02 NOTE — Discharge Instructions (Signed)
Post procedure care instructions No driving for 4 days. No lifting over 5 lbs for 1 week. No vigorous or sexual activity for 1 week. You may return to work/your usual activities on 09/09/2019. Keep procedure site clean & dry. If you notice increased pain, swelling, bleeding or pus, call/return!  You may shower, but no soaking baths/hot tubs/pools for 1 week.     You have an appointment set up with the Atrial Fibrillation Clinic.  Multiple studies have shown that being followed by a dedicated atrial fibrillation clinic in addition to the standard care you receive from your other physicians improves health. We believe that enrollment in the atrial fibrillation clinic will allow [place] to better care for you.   The phone number to the Atrial Fibrillation Clinic is [phone]. The clinic is staffed Monday through Friday from 8:30am to 5pm.  Parking Directions: The clinic is located in the Heart and Vascular Building connected to [facility]. 1)From [address] turn on to [address] and go to the 3rd entrance  (Heart and Vascular entrance) on the right. 2)Look to the right for Heart &Vascular Parking Garage. 3)A code for the entrance is required, for May is 5008   4)Take the elevators to the 1st floor. Registration is in the room with the glass walls at the end of the hallway.  If you have any trouble parking or locating the clinic, please don't hesitate to call [phone].

## 2019-09-04 ENCOUNTER — Other Ambulatory Visit: Payer: Self-pay | Admitting: Physician Assistant

## 2019-09-04 MED ORDER — FUROSEMIDE 20 MG PO TABS: 20.0000 mg | ORAL_TABLET | Freq: Every day | ORAL | 1 refills | Status: DC | PRN

## 2019-09-04 NOTE — Progress Notes (Signed)
   Patient called because he was having some shortness of breath and weight gain post ablation.  He had an ablation in January 2020 and the same thing happened then.  He came in and got IV Lasix, diuresed very well and symptoms resolved.  Today, he notes that his weight is up 5 pounds from yesterday.  He feels bloated and is a little short of breath.  He does not have any Lasix to take at home.  Sent in a prescription for Lasix 20 mg, 1 to 2 tablets daily as needed.  Patient is to keep all follow-up appointments and let us know if his symptoms do not improve.  He is also requested to eat some foods with potassium in them.

## 2019-09-05 NOTE — Anesthesia Postprocedure Evaluation (Signed)
Anesthesia Post Note  Patient: Ronald Hull  Procedure(s) Performed: ATRIAL FIBRILLATION ABLATION (N/A )     Patient location during evaluation: Endoscopy Anesthesia Type: General Level of consciousness: awake and alert Pain management: pain level controlled Vital Signs Assessment: post-procedure vital signs reviewed and stable Respiratory status: spontaneous breathing, nonlabored ventilation, respiratory function stable and patient connected to nasal cannula oxygen Cardiovascular status: blood pressure returned to baseline and stable Postop Assessment: no apparent nausea or vomiting Anesthetic complications: no    Last Vitals:  Vitals:   09/02/19 1830 09/02/19 1900  BP: 133/60 (!) 135/56  Pulse: 77 77  Resp: 11 10  Temp:    SpO2: 90% 99%    Last Pain:  Vitals:   09/02/19 1705  TempSrc:   PainSc: 0-No pain                 Kambre Messner

## 2019-09-23 DIAGNOSIS — H6121 Impacted cerumen, right ear: Secondary | ICD-10-CM | POA: Diagnosis not present

## 2019-09-29 NOTE — Progress Notes (Signed)
Primary Care Physician: Seward Carol, MD Referring Physician: Dr. Curt Bears Cardiologist: Dr. Gery Pray Ronald Hull is a 74 y.o. male with a h/o persistent afib, CAD, that is in the afib clinic for f/u.  He presented to his primary physician on 10/02/15 with an elevated and irregular heart rate. Cardioversion was attempted and was unsuccessful with repeat cardioversion successfully to 360 J. He was initially placed on amiodarone and has since been switched to Multaq. He went back into atrial fibrillation and had cardioversion 10/12/17.  He was scheduled for ablation, but on his pre-ablation CT scan was found to have coronary artery disease.  He was sent to catheterization and had a drug-eluting stent to his circumflex and RCA on 02/24/2018.  He had A. fib ablation January 2020 and has been started on amiodarone. Patient underwent successful DCCV on 09/22/18. He is on Eliquis for a CHADS2VASC score of 3.   On follow up today, patient had recurrent afib and underwent DCCV on 07/15/19 but did not convert to SR. He underwent repeat ablation with Dr Curt Bears on 09/02/19. He denies any symptoms of afib since the procedure. He did have some leg edema and took two doses of Lasix which resolved the symptoms. He denies CP, swallowing, or groin issues. He does report slow heart rates at home.  Today, he denies symptoms of palpitations, chest pain, orthopnea, PND, lower extremity edema, dizziness, presyncope, syncope, or neurologic sequela. The patient is tolerating medications without difficulties and is otherwise without complaint today.   Past Medical History:  Diagnosis Date  . Arthritis   . Bronchitis 05/23/2018  . CAD (coronary artery disease) 02/24/2018   LHC 10/19: pLAD 50, mLAD 50 (dFFR=0.89), oLCx 95, pRCA 30, mRCA 80 >> PCI: DES to LCx; DES to RCA // [place] participant (will DC Plavix after 30 d and remain on ASA + Apixaban)  . Chest pain secondary to ablation, with expected mild elevation in  troponin 05/22/2018  . Complication of anesthesia    BP dropped post colonoscopy  . Hypercholesteremia   . Hypertension   . Lung nodules 02/25/2019   Chest CT 01/2019: LUL nodule unchanged (benign); new LUL nodules 6 mm, aortic atherosclerosis, emphysema, partially calcified pleural plaques suggesting prior empyema or hemothorax  . Persistent atrial fibrillation ([place])   . Sleep apnea    does use a cpap  . Wears glasses    Past Surgical History:  Procedure Laterality Date  . ATRIAL FIBRILLATION ABLATION N/A 05/20/2018   Procedure: ATRIAL FIBRILLATION ABLATION;  Surgeon: Constance Haw, MD;  Location: [place] CV LAB;  Service: Cardiovascular;  Laterality: N/A;  . ATRIAL FIBRILLATION ABLATION N/A 09/02/2019   Procedure: ATRIAL FIBRILLATION ABLATION;  Surgeon: Constance Haw, MD;  Location: [place] CV LAB;  Service: Cardiovascular;  Laterality: N/A;  . CARDIOVERSION N/A 11/01/2015   Procedure: CARDIOVERSION;  Surgeon: Pixie Casino, MD;  Location: [facility] ENDOSCOPY;  Service: Cardiovascular;  Laterality: N/A;  . CARDIOVERSION N/A 11/23/2015   Procedure: CARDIOVERSION;  Surgeon: Will Meredith Leeds, MD;  Location: [place];  Service: Cardiovascular;  Laterality: N/A;  . CARDIOVERSION N/A 10/12/2017   Procedure: CARDIOVERSION;  Surgeon: Pixie Casino, MD;  Location: [place];  Service: Cardiovascular;  Laterality: N/A;  . CARDIOVERSION N/A 06/30/2018   Procedure: CARDIOVERSION;  Surgeon: Jerline Pain, MD;  Location: [facility] ENDOSCOPY;  Service: Cardiovascular;  Laterality: N/A;  . CARDIOVERSION N/A 09/22/2018   Procedure: CARDIOVERSION;  Surgeon: Sueanne Margarita, MD;  Location:  [facility] ENDOSCOPY;  Service: Cardiovascular;  Laterality: N/A;  . CARDIOVERSION N/A 07/15/2019   Procedure: CARDIOVERSION;  Surgeon: Jodelle Red, MD;  Location: [facility] ENDOSCOPY;  Service: Cardiovascular;  Laterality: N/A;  . COLONOSCOPY    . CORONARY STENT INTERVENTION N/A 02/24/2018   Procedure:  CORONARY STENT INTERVENTION;  Surgeon: Tonny Bollman, MD;  Location: [facility] INVASIVE CV LAB;  Service: Cardiovascular;  Laterality: N/A;  . INTRAVASCULAR PRESSURE WIRE/FFR STUDY N/A 02/24/2018   Procedure: INTRAVASCULAR PRESSURE WIRE/FFR STUDY;  Surgeon: Tonny Bollman, MD;  Location: [facility] INVASIVE CV LAB;  Service: Cardiovascular;  Laterality: N/A;  . KNEE ARTHROSCOPY  2000  . LEFT HEART CATH AND CORONARY ANGIOGRAPHY N/A 02/24/2018   Procedure: LEFT HEART CATH AND CORONARY ANGIOGRAPHY;  Surgeon: Tonny Bollman, MD;  Location: [facility] INVASIVE CV LAB;  Service: Cardiovascular;  Laterality: N/A;  . SHOULDER ARTHROSCOPY WITH ROTATOR CUFF REPAIR AND SUBACROMIAL DECOMPRESSION Right 07/15/2012   Procedure: RIGHT ARTHROSCOPY SHOULDER DECOMPRESSION  SUBACROMIAL PARTIAL ACROMIOPLASTY WITH CORACOACROMIAL RELEASE,  DISTAL CLAVICULECTOMY, resect biceps debride labrium WITH ROTATOR CUFF REPAIR;  Surgeon: [name], MD;  Location: [facility];  Service: Orthopedics;  Laterality: Right;    Current Outpatient Medications  Medication Sig Dispense Refill  . acetaminophen (TYLENOL) 325 MG tablet Take 325 mg by mouth every 6 (six) hours as needed (for pain.).     Marland Kitchen amiodarone (PACERONE) 200 MG tablet Take 1 tablet (200 mg total) by mouth daily. 90 tablet 2  . ASPIRIN LOW DOSE 81 MG chewable tablet Chew 81 mg by mouth every evening.   0  . atorvastatin (LIPITOR) 40 MG tablet TAKE 1 TABLET BY MOUTH DAILY (Patient taking differently: 20 mg. ) 90 tablet 1  . ELIQUIS 5 MG TABS tablet TAKE 1 TABLET(5 MG) BY MOUTH TWICE DAILY 60 tablet 5  . furosemide (LASIX) 20 MG tablet Take 1-2 tablets (20-40 mg total) by mouth daily as needed. 30 tablet 1  . losartan (COZAAR) 25 MG tablet Take 1 tablet (25 mg total) by mouth daily. 90 tablet 3  . metoprolol tartrate (LOPRESSOR) 50 MG tablet Take 25 mg by mouth 2 (two) times daily.     . nitroGLYCERIN (NITROSTAT) 0.4 MG SL tablet PLACE ONE TABLET UNDER TONGUE AS NEEDED FOR  CHEST PAIN EVERY 5 MINUTES AS NEEDED (Patient taking differently: Place 0.4 mg under the tongue every 5 (five) minutes as needed for chest pain. ) 25 tablet 2  . zaleplon (SONATA) 5 MG capsule Take 5 mg by mouth at bedtime as needed for sleep.     No current facility-administered medications for this encounter.    Allergies  Allergen Reactions  . Lisinopril Cough    Social History   Socioeconomic History  . Marital status: Married    Spouse name: Not on file  . Number of children: Not on file  . Years of education: Not on file  . Highest education level: Not on file  Occupational History  . Not on file  Tobacco Use  . Smoking status: Former Smoker    Packs/day: 2.00    Years: 10.00    Pack years: 20.00    Types: Cigarettes    Quit date: 07/09/1973    Years since quitting: 46.2  . Smokeless tobacco: Never Used  Substance and Sexual Activity  . Alcohol use: Yes    Alcohol/week: 2.0 standard drinks    Types: 2 Glasses of wine per week  . Drug use: No  . Sexual activity: Not Currently  Birth control/protection: Abstinence  Other Topics Concern  . Not on file  Social History Narrative  . Not on file   Social Determinants of Health   Financial Resource Strain:   . Difficulty of Paying Living Expenses:   Food Insecurity:   . Worried About Programme researcher, broadcasting/film/video in the Last Year:   . Barista in the Last Year:   Transportation Needs:   . Freight forwarder (Medical):   Marland Kitchen Lack of Transportation (Non-Medical):   Physical Activity:   . Days of Exercise per Week:   . Minutes of Exercise per Session:   Stress:   . Feeling of Stress :   Social Connections:   . Frequency of Communication with Friends and Family:   . Frequency of Social Gatherings with Friends and Family:   . Attends Religious Services:   . Active Member of Clubs or Organizations:   . Attends Banker Meetings:   Marland Kitchen Marital Status:   Intimate Partner Violence:   . Fear of Current  or Ex-Partner:   . Emotionally Abused:   Marland Kitchen Physically Abused:   . Sexually Abused:     Family History  Problem Relation Age of Onset  . Heart failure Father   . Other Mother        bad fall  . Clotting disorder Mother   . Cancer Sister   . Hypertension Sister   . Heart disease Brother     ROS- All systems are reviewed and negative except as per the HPI above  Physical Exam: Vitals:   09/30/19 0912  BP: 132/78  Pulse: (!) 46  Weight: 125.9 kg  Height: 5\' 11"  (1.803 m)   Wt Readings from Last 3 Encounters:  09/30/19 125.9 kg  09/02/19 120.2 kg  07/15/19 122.5 kg    Labs: Lab Results  Component Value Date   NA 137 08/29/2019   K 4.6 08/29/2019   CL 103 08/29/2019   CO2 21 08/29/2019   GLUCOSE 112 (H) 08/29/2019   BUN 21 08/29/2019   CREATININE 1.11 08/29/2019   CALCIUM 9.2 08/29/2019   No results found for: INR Lab Results  Component Value Date   CHOL 96 (L) 04/09/2018   HDL 31 (L) 04/09/2018   LDLCALC 50 04/09/2018   TRIG 77 04/09/2018    GEN- The patient is well appearing obese male, alert and oriented x 3 today.   HEENT-head normocephalic, atraumatic, sclera clear, conjunctiva pink, hearing intact, trachea midline. Lungs- Clear to ausculation bilaterally, normal work of breathing Heart- Regular rate and rhythm, no murmurs, rubs or gallops  GI- soft, NT, ND, + BS Extremities- no clubbing, cyanosis, or edema MS- no significant deformity or atrophy Skin- no rash or lesion Psych- euthymic mood, full affect Neuro- strength and sensation are intact   EKG-  SB HR 46, 1st degree AV block, PR 230, QRS 108, QTc 385  Echo 05/22/18 demonstrates - Procedure narrative: Transthoracic echocardiography. Image   quality was poor. The study was technically difficult, as a   result of poor acoustic windows. Intravenous contrast (Definity)   was administered. - Left ventricle: The cavity size was normal. Wall thickness was increased in a pattern of mild LVH.  Systolic function was normal.   The estimated ejection fraction was in the range of 60% to 65%.   Wall motion was normal; there were no regional wall motion   abnormalities. Doppler parameters are consistent with restrictive   physiology, indicative of decreased left  ventricular diastolic   compliance and/or increased left atrial pressure. Doppler   parameters are consistent with high ventricular filling pressure. - Aortic valve: Mildly calcified annulus. Trileaflet. There was   mild regurgitation. - Mitral valve: Moderately calcified annulus. Normal thickness   leaflets . There was mild regurgitation. - Tricuspid valve: There was mild regurgitation. - Systemic veins: IVC poorly visualized but appears dilated.  Assessment and Plan: 1. Persistent atrial fibrillation S/p ablation 05/20/18 with repeat ablation 09/02/19. Patient appears to be maintaining SR. Continue amiodarone 200 mg daily. Cmet/TSH today. Continue Eliquis 5 mg BID Decrease Lopressor to 12.5 mg BID  This patients CHA2DS2-VASc Score and unadjusted Ischemic Stroke Rate (% per year) is equal to 3.2 % stroke rate/year from a score of 3  Above score calculated as 1 point each if present [CHF, HTN, DM, Vascular=MI/PAD/Aortic Plaque, Age if 65-74, or Male] Above score calculated as 2 points each if present [Age > 75, or Stroke/TIA/TE]  2. CAD S/p DES to RCA and LCx.  No anginal symptoms  3. HTN  Stable, will increase Losartan to 50 mg daily to compensate for decreasing BB. Bmet in 2 weeks.  4. OSA Encouraged compliance with CPAP therapy.  5. Obesity Body mass index is 38.72 kg/m. Lifestyle modification was discussed and encouraged including regular physical activity and weight reduction.  6. Bradycardia Patient does note some mild fatigue. Decrease BB as above.   Follow up with Dr Elberta Fortis as scheduled.    [name] PA-C Afib [facility] [address] [place], [place]  [address] [phone]

## 2019-09-30 ENCOUNTER — Ambulatory Visit (HOSPITAL_COMMUNITY)
Admission: RE | Admit: 2019-09-30 | Discharge: 2019-09-30 | Disposition: A | Discharge status: Home / Self Care | Payer: Medicare Other | Source: Ambulatory Visit | Attending: Physician Assistant | Admitting: Physician Assistant

## 2019-09-30 ENCOUNTER — Encounter (HOSPITAL_COMMUNITY): Payer: Self-pay | Admitting: Physician Assistant

## 2019-09-30 ENCOUNTER — Other Ambulatory Visit: Payer: Self-pay

## 2019-09-30 VITALS — BP 132/78 | HR 46 | Ht 71.0 in | Wt 277.6 lb

## 2019-09-30 DIAGNOSIS — Z8249 Family history of ischemic heart disease and other diseases of the circulatory system: Secondary | ICD-10-CM | POA: Insufficient documentation

## 2019-09-30 DIAGNOSIS — Z87891 Personal history of nicotine dependence: Secondary | ICD-10-CM | POA: Insufficient documentation

## 2019-09-30 DIAGNOSIS — Z7982 Long term (current) use of aspirin: Secondary | ICD-10-CM | POA: Insufficient documentation

## 2019-09-30 DIAGNOSIS — G4733 Obstructive sleep apnea (adult) (pediatric): Secondary | ICD-10-CM | POA: Insufficient documentation

## 2019-09-30 DIAGNOSIS — D6869 Other thrombophilia: Secondary | ICD-10-CM | POA: Diagnosis not present

## 2019-09-30 DIAGNOSIS — E7800 Pure hypercholesterolemia, unspecified: Secondary | ICD-10-CM | POA: Diagnosis not present

## 2019-09-30 DIAGNOSIS — I10 Essential (primary) hypertension: Secondary | ICD-10-CM | POA: Diagnosis not present

## 2019-09-30 DIAGNOSIS — I2510 Atherosclerotic heart disease of native coronary artery without angina pectoris: Secondary | ICD-10-CM | POA: Insufficient documentation

## 2019-09-30 DIAGNOSIS — Z6838 Body mass index (BMI) 38.0-38.9, adult: Secondary | ICD-10-CM | POA: Diagnosis not present

## 2019-09-30 DIAGNOSIS — Z79899 Other long term (current) drug therapy: Secondary | ICD-10-CM | POA: Diagnosis not present

## 2019-09-30 DIAGNOSIS — Z955 Presence of coronary angioplasty implant and graft: Secondary | ICD-10-CM | POA: Diagnosis not present

## 2019-09-30 DIAGNOSIS — Z7901 Long term (current) use of anticoagulants: Secondary | ICD-10-CM | POA: Insufficient documentation

## 2019-09-30 DIAGNOSIS — E669 Obesity, unspecified: Secondary | ICD-10-CM | POA: Insufficient documentation

## 2019-09-30 DIAGNOSIS — I4819 Other persistent atrial fibrillation: Secondary | ICD-10-CM

## 2019-09-30 DIAGNOSIS — Z888 Allergy status to other drugs, medicaments and biological substances status: Secondary | ICD-10-CM | POA: Diagnosis not present

## 2019-09-30 DIAGNOSIS — R001 Bradycardia, unspecified: Secondary | ICD-10-CM | POA: Insufficient documentation

## 2019-09-30 DIAGNOSIS — M1990 Unspecified osteoarthritis, unspecified site: Secondary | ICD-10-CM | POA: Insufficient documentation

## 2019-09-30 LAB — COMPREHENSIVE METABOLIC PANEL
ALT: 34 U/L (ref 0–44)
AST: 22 U/L (ref 15–41)
Albumin: 3.4 g/dL — ABNORMAL LOW (ref 3.5–5.0)
Alkaline Phosphatase: 65 U/L (ref 38–126)
Anion gap: 8 (ref 5–15)
BUN: 12 mg/dL (ref 8–23)
CO2: 23 mmol/L (ref 22–32)
Calcium: 8.6 mg/dL — ABNORMAL LOW (ref 8.9–10.3)
Chloride: 105 mmol/L (ref 98–111)
Creatinine, Ser: 1.09 mg/dL (ref 0.61–1.24)
GFR calc Af Amer: >60 mL/min (ref >60)
GFR calc non Af Amer: >60 mL/min (ref >60)
Glucose, Bld: 106 mg/dL — ABNORMAL HIGH (ref 70–99)
Potassium: 4.5 mmol/L (ref 3.5–5.1)
Sodium: 136 mmol/L (ref 135–145)
Total Bilirubin: 0.8 mg/dL (ref 0.3–1.2)
Total Protein: 6.1 g/dL — ABNORMAL LOW (ref 6.5–8.1)

## 2019-09-30 LAB — TSH: TSH: 2.757 u[IU]/mL (ref 0.350–4.500)

## 2019-09-30 MED ORDER — LOSARTAN POTASSIUM 25 MG PO TABS: 50.0000 mg | ORAL_TABLET | Freq: Every day | ORAL | 3 refills | Status: DC

## 2019-09-30 NOTE — Patient Instructions (Signed)
Increase losartan to 50mg once a day 

## 2019-10-14 ENCOUNTER — Ambulatory Visit (HOSPITAL_COMMUNITY)
Admission: RE | Admit: 2019-10-14 | Discharge: 2019-10-14 | Disposition: A | Discharge status: Home / Self Care | Payer: Medicare Other | Source: Ambulatory Visit | Attending: Physician Assistant | Admitting: Physician Assistant

## 2019-10-14 ENCOUNTER — Other Ambulatory Visit: Payer: Self-pay

## 2019-10-14 DIAGNOSIS — I10 Essential (primary) hypertension: Secondary | ICD-10-CM | POA: Insufficient documentation

## 2019-10-14 DIAGNOSIS — Z79899 Other long term (current) drug therapy: Secondary | ICD-10-CM | POA: Diagnosis not present

## 2019-10-14 LAB — BASIC METABOLIC PANEL
Anion gap: 8 (ref 5–15)
BUN: 14 mg/dL (ref 8–23)
CO2: 21 mmol/L — ABNORMAL LOW (ref 22–32)
Calcium: 8.5 mg/dL — ABNORMAL LOW (ref 8.9–10.3)
Chloride: 108 mmol/L (ref 98–111)
Creatinine, Ser: 1.18 mg/dL (ref 0.61–1.24)
GFR calc Af Amer: >60 mL/min (ref >60)
GFR calc non Af Amer: >60 mL/min (ref >60)
Glucose, Bld: 82 mg/dL (ref 70–99)
Potassium: 4.7 mmol/L (ref 3.5–5.1)
Sodium: 137 mmol/L (ref 135–145)

## 2019-10-18 ENCOUNTER — Ambulatory Visit: Payer: Medicare Other | Admitting: Cardiology

## 2019-10-19 ENCOUNTER — Ambulatory Visit (HOSPITAL_BASED_OUTPATIENT_CLINIC_OR_DEPARTMENT_OTHER): Admission: RE | Admit: 2019-10-19 | Payer: Medicare Other | Source: Ambulatory Visit | Admitting: Orthopedic Surgery

## 2019-10-19 ENCOUNTER — Ambulatory Visit (INDEPENDENT_AMBULATORY_CARE_PROVIDER_SITE_OTHER)
Admission: EM | Admit: 2019-10-19 | Discharge: 2019-10-19 | Disposition: A | Discharge status: Home / Self Care | Payer: Medicare Other | Source: Home / Self Care

## 2019-10-19 ENCOUNTER — Encounter (HOSPITAL_BASED_OUTPATIENT_CLINIC_OR_DEPARTMENT_OTHER)
Admission: RE | Disposition: A | Discharge status: Home / Self Care | Payer: Self-pay | Source: Ambulatory Visit | Attending: Orthopedic Surgery

## 2019-10-19 ENCOUNTER — Other Ambulatory Visit: Payer: Self-pay

## 2019-10-19 ENCOUNTER — Other Ambulatory Visit (HOSPITAL_COMMUNITY): Payer: Self-pay | Admitting: *Deleted

## 2019-10-19 ENCOUNTER — Encounter (HOSPITAL_COMMUNITY): Payer: Self-pay

## 2019-10-19 ENCOUNTER — Ambulatory Visit (HOSPITAL_BASED_OUTPATIENT_CLINIC_OR_DEPARTMENT_OTHER): Payer: Medicare Other | Admitting: Certified Registered"

## 2019-10-19 ENCOUNTER — Ambulatory Visit (INDEPENDENT_AMBULATORY_CARE_PROVIDER_SITE_OTHER): Payer: Medicare Other

## 2019-10-19 ENCOUNTER — Encounter (HOSPITAL_COMMUNITY): Admission: EM | Disposition: A | Discharge status: Home / Self Care | Payer: Self-pay | Source: Home / Self Care

## 2019-10-19 ENCOUNTER — Encounter (HOSPITAL_BASED_OUTPATIENT_CLINIC_OR_DEPARTMENT_OTHER): Payer: Self-pay | Admitting: Orthopedic Surgery

## 2019-10-19 ENCOUNTER — Ambulatory Visit (HOSPITAL_BASED_OUTPATIENT_CLINIC_OR_DEPARTMENT_OTHER)
Admission: RE | Admit: 2019-10-19 | Discharge: 2019-10-19 | Disposition: A | Discharge status: Home / Self Care | Payer: Medicare Other | Source: Ambulatory Visit | Attending: Orthopedic Surgery | Admitting: Orthopedic Surgery

## 2019-10-19 DIAGNOSIS — W19XXXA Unspecified fall, initial encounter: Secondary | ICD-10-CM | POA: Diagnosis not present

## 2019-10-19 DIAGNOSIS — I2510 Atherosclerotic heart disease of native coronary artery without angina pectoris: Secondary | ICD-10-CM | POA: Diagnosis not present

## 2019-10-19 DIAGNOSIS — Z20822 Contact with and (suspected) exposure to covid-19: Secondary | ICD-10-CM | POA: Diagnosis not present

## 2019-10-19 DIAGNOSIS — S61412A Laceration without foreign body of left hand, initial encounter: Secondary | ICD-10-CM | POA: Diagnosis not present

## 2019-10-19 DIAGNOSIS — J439 Emphysema, unspecified: Secondary | ICD-10-CM | POA: Diagnosis not present

## 2019-10-19 DIAGNOSIS — G4730 Sleep apnea, unspecified: Secondary | ICD-10-CM | POA: Diagnosis not present

## 2019-10-19 DIAGNOSIS — Z87891 Personal history of nicotine dependence: Secondary | ICD-10-CM | POA: Insufficient documentation

## 2019-10-19 DIAGNOSIS — Z955 Presence of coronary angioplasty implant and graft: Secondary | ICD-10-CM | POA: Diagnosis not present

## 2019-10-19 DIAGNOSIS — I10 Essential (primary) hypertension: Secondary | ICD-10-CM | POA: Insufficient documentation

## 2019-10-19 DIAGNOSIS — S50312A Abrasion of left elbow, initial encounter: Secondary | ICD-10-CM | POA: Insufficient documentation

## 2019-10-19 DIAGNOSIS — I4819 Other persistent atrial fibrillation: Secondary | ICD-10-CM | POA: Diagnosis not present

## 2019-10-19 DIAGNOSIS — Z79899 Other long term (current) drug therapy: Secondary | ICD-10-CM | POA: Insufficient documentation

## 2019-10-19 DIAGNOSIS — Z7982 Long term (current) use of aspirin: Secondary | ICD-10-CM | POA: Diagnosis not present

## 2019-10-19 DIAGNOSIS — I480 Paroxysmal atrial fibrillation: Secondary | ICD-10-CM | POA: Diagnosis not present

## 2019-10-19 DIAGNOSIS — S62615B Displaced fracture of proximal phalanx of left ring finger, initial encounter for open fracture: Secondary | ICD-10-CM | POA: Diagnosis not present

## 2019-10-19 DIAGNOSIS — Z7901 Long term (current) use of anticoagulants: Secondary | ICD-10-CM | POA: Insufficient documentation

## 2019-10-19 DIAGNOSIS — D6869 Other thrombophilia: Secondary | ICD-10-CM | POA: Diagnosis not present

## 2019-10-19 DIAGNOSIS — S62617B Displaced fracture of proximal phalanx of left little finger, initial encounter for open fracture: Secondary | ICD-10-CM

## 2019-10-19 DIAGNOSIS — S62617A Displaced fracture of proximal phalanx of left little finger, initial encounter for closed fracture: Secondary | ICD-10-CM | POA: Diagnosis not present

## 2019-10-19 DIAGNOSIS — E7800 Pure hypercholesterolemia, unspecified: Secondary | ICD-10-CM | POA: Insufficient documentation

## 2019-10-19 DIAGNOSIS — S62607B Fracture of unspecified phalanx of left little finger, initial encounter for open fracture: Secondary | ICD-10-CM

## 2019-10-19 DIAGNOSIS — S0031XA Abrasion of nose, initial encounter: Secondary | ICD-10-CM | POA: Diagnosis not present

## 2019-10-19 DIAGNOSIS — Z4802 Encounter for removal of sutures: Secondary | ICD-10-CM

## 2019-10-19 HISTORY — PX: I & D EXTREMITY: SHX5045

## 2019-10-19 HISTORY — PX: OPEN REDUCTION INTERNAL FIXATION (ORIF) PROXIMAL PHALANX: SHX6235

## 2019-10-19 LAB — SARS CORONAVIRUS 2 BY RT PCR (HOSPITAL ORDER, PERFORMED IN ~~LOC~~ HOSPITAL LAB): SARS Coronavirus 2: NEGATIVE

## 2019-10-19 SURGERY — OPEN REDUCTION INTERNAL FIXATION (ORIF) PROXIMAL PHALANX
Anesthesia: General | Site: Hand | Laterality: Left

## 2019-10-19 SURGERY — CANCELLED PROCEDURE
Anesthesia: General | Site: Finger | Laterality: Left

## 2019-10-19 MED ORDER — DEXAMETHASONE SODIUM PHOSPHATE 10 MG/ML IJ SOLN
INTRAMUSCULAR | Status: DC | PRN
  Administered 2019-10-19: 4 mg via INTRAVENOUS

## 2019-10-19 MED ORDER — HYDROCODONE-ACETAMINOPHEN 5-325 MG PO TABS: 1.0000 | ORAL_TABLET | ORAL | 0 refills | Status: AC | PRN

## 2019-10-19 MED ORDER — FENTANYL CITRATE (PF) 100 MCG/2ML IJ SOLN
INTRAMUSCULAR | Status: DC | PRN
  Administered 2019-10-19 (×4): 25 ug via INTRAVENOUS
  Administered 2019-10-19: 50 ug via INTRAVENOUS

## 2019-10-19 MED ORDER — ONDANSETRON HCL 4 MG/2ML IJ SOLN: 4.0000 mg | Freq: Once | INTRAMUSCULAR | Status: DC | PRN

## 2019-10-19 MED ORDER — PROPOFOL 10 MG/ML IV BOLUS
INTRAVENOUS | Status: DC | PRN
  Administered 2019-10-19: 200 mg via INTRAVENOUS

## 2019-10-19 MED ORDER — LIDOCAINE HCL (CARDIAC) PF 100 MG/5ML IV SOSY
PREFILLED_SYRINGE | INTRAVENOUS | Status: DC | PRN
  Administered 2019-10-19: 60 mg via INTRAVENOUS

## 2019-10-19 MED ORDER — CEPHALEXIN 500 MG PO CAPS: 500.0000 mg | ORAL_CAPSULE | Freq: Four times a day (QID) | ORAL | 0 refills | Status: DC

## 2019-10-19 MED ORDER — OXYCODONE HCL 5 MG PO TABS
5.0000 mg | ORAL_TABLET | Freq: Once | ORAL | Status: AC | PRN
  Administered 2019-10-19: 5 mg via ORAL

## 2019-10-19 MED ORDER — FENTANYL CITRATE (PF) 100 MCG/2ML IJ SOLN: 25.0000 ug | INTRAMUSCULAR | Status: DC | PRN

## 2019-10-19 MED ORDER — CEFAZOLIN SODIUM-DEXTROSE 2-4 GM/100ML-% IV SOLN
2.0000 g | INTRAVENOUS | Status: DC
Start: 2019-10-20 — End: 2019-10-19
  Administered 2019-10-19: 2 g via INTRAVENOUS

## 2019-10-19 MED ORDER — ONDANSETRON HCL 4 MG/2ML IJ SOLN
INTRAMUSCULAR | Status: DC | PRN
  Administered 2019-10-19: 4 mg via INTRAVENOUS

## 2019-10-19 MED ORDER — 0.9 % SODIUM CHLORIDE (POUR BTL) OPTIME
TOPICAL | Status: DC | PRN
  Administered 2019-10-19: 300 mL

## 2019-10-19 MED ORDER — LACTATED RINGERS IV SOLN: INTRAVENOUS | Status: DC

## 2019-10-19 MED ORDER — LOSARTAN POTASSIUM 50 MG PO TABS: 50.0000 mg | ORAL_TABLET | Freq: Every day | ORAL | 2 refills | Status: DC

## 2019-10-19 MED ORDER — OXYCODONE HCL 5 MG/5ML PO SOLN: 5.0000 mg | Freq: Once | ORAL | Status: AC | PRN

## 2019-10-19 MED ORDER — METOPROLOL TARTRATE 25 MG PO TABS: 25.0000 mg | ORAL_TABLET | Freq: Two times a day (BID) | ORAL | 1 refills | Status: DC

## 2019-10-19 MED ORDER — MIDAZOLAM HCL 5 MG/5ML IJ SOLN
INTRAMUSCULAR | Status: DC | PRN
  Administered 2019-10-19: .5 mg via INTRAVENOUS

## 2019-10-19 SURGICAL SUPPLY — 66 items
BLADE SURG 15 STRL LF DISP TIS (BLADE) ×2 IMPLANT
BLADE SURG 15 STRL SS (BLADE) ×2
BNDG COHESIVE 1X5 TAN STRL LF (GAUZE/BANDAGES/DRESSINGS) IMPLANT
BNDG COHESIVE 3X5 TAN STRL LF (GAUZE/BANDAGES/DRESSINGS) IMPLANT
BNDG CONFORM 2 STRL LF (GAUZE/BANDAGES/DRESSINGS) ×3 IMPLANT
BNDG CONFORM 3 STRL LF (GAUZE/BANDAGES/DRESSINGS) ×3 IMPLANT
BNDG ELASTIC 2X5.8 VLCR STR LF (GAUZE/BANDAGES/DRESSINGS) ×3 IMPLANT
BNDG ELASTIC 3X5.8 VLCR STR LF (GAUZE/BANDAGES/DRESSINGS) ×3 IMPLANT
BNDG ELASTIC 4X5.8 VLCR STR LF (GAUZE/BANDAGES/DRESSINGS) IMPLANT
BNDG ESMARK 4X9 LF (GAUZE/BANDAGES/DRESSINGS) ×3 IMPLANT
BNDG GAUZE ELAST 4 BULKY (GAUZE/BANDAGES/DRESSINGS) ×3 IMPLANT
CANISTER SUCT 1200ML W/VALVE (MISCELLANEOUS) IMPLANT
CORD BIPOLAR FORCEPS 12FT (ELECTRODE) ×3 IMPLANT
COVER BACK TABLE 60X90IN (DRAPES) ×3 IMPLANT
COVER MAYO STAND STRL (DRAPES) ×3 IMPLANT
COVER WAND RF STERILE (DRAPES) IMPLANT
CUFF TOURN SGL QUICK 18X4 (TOURNIQUET CUFF) ×3 IMPLANT
DECANTER SPIKE VIAL GLASS SM (MISCELLANEOUS) IMPLANT
DRAIN TLS ROUND 10FR (DRAIN) IMPLANT
DRAPE EXTREMITY T 121X128X90 (DISPOSABLE) ×3 IMPLANT
DRAPE OEC MINIVIEW 54X84 (DRAPES) ×3 IMPLANT
DRAPE SURG 17X23 STRL (DRAPES) ×3 IMPLANT
DRSG EMULSION OIL 3X3 NADH (GAUZE/BANDAGES/DRESSINGS) IMPLANT
GAUZE 4X4 16PLY RFD (DISPOSABLE) IMPLANT
GAUZE SPONGE 4X4 12PLY STRL (GAUZE/BANDAGES/DRESSINGS) ×3 IMPLANT
GAUZE XEROFORM 1X8 LF (GAUZE/BANDAGES/DRESSINGS) ×3 IMPLANT
GLOVE BIO SURGEON STRL SZ 6.5 (GLOVE) ×3 IMPLANT
GLOVE BIO SURGEON STRL SZ8 (GLOVE) ×3 IMPLANT
GLOVE BIOGEL PI IND STRL 6.5 (GLOVE) ×2 IMPLANT
GLOVE BIOGEL PI IND STRL 8.5 (GLOVE) ×2 IMPLANT
GLOVE BIOGEL PI INDICATOR 6.5 (GLOVE) ×1
GLOVE BIOGEL PI INDICATOR 8.5 (GLOVE) ×1
GOWN STRL REUS W/ TWL LRG LVL3 (GOWN DISPOSABLE) ×2 IMPLANT
GOWN STRL REUS W/ TWL XL LVL3 (GOWN DISPOSABLE) ×2 IMPLANT
GOWN STRL REUS W/TWL LRG LVL3 (GOWN DISPOSABLE) ×2
GOWN STRL REUS W/TWL XL LVL3 (GOWN DISPOSABLE) ×2
K-WIRE .045X4 (WIRE) ×6 IMPLANT
NEEDLE HYPO 22GX1.5 SAFETY (NEEDLE) IMPLANT
NEEDLE HYPO 25X1 1.5 SAFETY (NEEDLE) ×3 IMPLANT
NS IRRIG 1000ML POUR BTL (IV SOLUTION) ×3 IMPLANT
PAD ALCOHOL SWAB (MISCELLANEOUS) IMPLANT
PAD CAST 3X4 CTTN HI CHSV (CAST SUPPLIES) ×2 IMPLANT
PAD CAST 4YDX4 CTTN HI CHSV (CAST SUPPLIES) IMPLANT
PADDING CAST ABS 3INX4YD NS (CAST SUPPLIES) ×1
PADDING CAST ABS 4INX4YD NS (CAST SUPPLIES)
PADDING CAST ABS COTTON 3X4 (CAST SUPPLIES) ×2 IMPLANT
PADDING CAST ABS COTTON 4X4 ST (CAST SUPPLIES) IMPLANT
PADDING CAST COTTON 2X4 NS (CAST SUPPLIES) IMPLANT
PADDING CAST COTTON 3X4 STRL (CAST SUPPLIES) ×3
PADDING CAST COTTON 4X4 STRL (CAST SUPPLIES)
SET BASIN DAY SURGERY F.S. (CUSTOM PROCEDURE TRAY) ×3 IMPLANT
SHEET MEDIUM DRAPE 40X70 STRL (DRAPES) IMPLANT
SPLINT FIBERGLASS 3X35 (CAST SUPPLIES) ×3 IMPLANT
SPLINT FIBERGLASS 4X30 (CAST SUPPLIES) IMPLANT
STOCKINETTE 4X48 STRL (DRAPES) ×3 IMPLANT
STRIP CLOSURE SKIN 1/2X4 (GAUZE/BANDAGES/DRESSINGS) IMPLANT
SUCTION FRAZIER HANDLE 10FR (MISCELLANEOUS)
SUCTION TUBE FRAZIER 10FR DISP (MISCELLANEOUS) IMPLANT
SUT MON AB 4-0 PC3 18 (SUTURE) IMPLANT
SUT PROLENE 4 0 PS 2 18 (SUTURE) ×3 IMPLANT
SYR BULB EAR ULCER 3OZ GRN STR (SYRINGE) ×3 IMPLANT
SYR CONTROL 10ML LL (SYRINGE) ×3 IMPLANT
TOWEL GREEN STERILE FF (TOWEL DISPOSABLE) ×6 IMPLANT
TRAY DSU PREP LF (CUSTOM PROCEDURE TRAY) ×3 IMPLANT
TUBE CONNECTING 20X1/4 (TUBING) IMPLANT
UNDERPAD 30X36 HEAVY ABSORB (UNDERPADS AND DIAPERS) ×3 IMPLANT

## 2019-10-19 NOTE — H&P (Signed)
Ronald Hull is an 74 y.o. male.   Chief Complaint: Left small finger injury HPI: Patient is a 74 year old male with past medical history of arthritis, bronchitis, CAD, hypertension, hypercholesterolemia, persistent A. fib on Eliquis.  He presents today with laceration between the fourth and fifth digits.  This occurred last night when he fell onto concrete.  He since had continued bleeding and pain from the area.  Limited range of motion of the fifth digit.  Minor abrasions to arm/elbow and bridge of nose from glasses.  Denies hitting head or any loss of consciousness.  Past Medical History:  Diagnosis Date  . Arthritis   . Bronchitis 05/23/2018  . CAD (coronary artery disease) 02/24/2018   LHC 10/19: pLAD 50, mLAD 50 (dFFR=0.89), oLCx 95, pRCA 30, mRCA 80 >> PCI: DES to LCx; DES to RCA // [place] participant (will DC Plavix after 30 d and remain on ASA + Apixaban)  . Chest pain secondary to ablation, with expected mild elevation in troponin 05/22/2018  . Complication of anesthesia    BP dropped post colonoscopy  . Hypercholesteremia   . Hypertension   . Lung nodules 02/25/2019   Chest CT 01/2019: LUL nodule unchanged (benign); new LUL nodules 6 mm, aortic atherosclerosis, emphysema, partially calcified pleural plaques suggesting prior empyema or hemothorax  . Persistent atrial fibrillation ([place])   . Sleep apnea    does use a cpap  . Wears glasses     Past Surgical History:  Procedure Laterality Date  . ATRIAL FIBRILLATION ABLATION N/A 05/20/2018   Procedure: ATRIAL FIBRILLATION ABLATION;  Surgeon: Constance Haw, MD;  Location: [place] CV LAB;  Service: Cardiovascular;  Laterality: N/A;  . ATRIAL FIBRILLATION ABLATION N/A 09/02/2019   Procedure: ATRIAL FIBRILLATION ABLATION;  Surgeon: Constance Haw, MD;  Location: [place] CV LAB;  Service: Cardiovascular;  Laterality: N/A;  . CARDIOVERSION N/A 11/01/2015   Procedure: CARDIOVERSION;  Surgeon: Pixie Casino,  MD;  Location: [facility] ENDOSCOPY;  Service: Cardiovascular;  Laterality: N/A;  . CARDIOVERSION N/A 11/23/2015   Procedure: CARDIOVERSION;  Surgeon: Will Meredith Leeds, MD;  Location: [place];  Service: Cardiovascular;  Laterality: N/A;  . CARDIOVERSION N/A 10/12/2017   Procedure: CARDIOVERSION;  Surgeon: Pixie Casino, MD;  Location: [place];  Service: Cardiovascular;  Laterality: N/A;  . CARDIOVERSION N/A 06/30/2018   Procedure: CARDIOVERSION;  Surgeon: Jerline Pain, MD;  Location: [facility] ENDOSCOPY;  Service: Cardiovascular;  Laterality: N/A;  . CARDIOVERSION N/A 09/22/2018   Procedure: CARDIOVERSION;  Surgeon: Sueanne Margarita, MD;  Location: [facility] ENDOSCOPY;  Service: Cardiovascular;  Laterality: N/A;  . CARDIOVERSION N/A 07/15/2019   Procedure: CARDIOVERSION;  Surgeon: Buford Dresser, MD;  Location: [facility] ENDOSCOPY;  Service: Cardiovascular;  Laterality: N/A;  . COLONOSCOPY    . CORONARY STENT INTERVENTION N/A 02/24/2018   Procedure: CORONARY STENT INTERVENTION;  Surgeon: Sherren Mocha, MD;  Location: [place] CV LAB;  Service: Cardiovascular;  Laterality: N/A;  . INTRAVASCULAR PRESSURE WIRE/FFR STUDY N/A 02/24/2018   Procedure: INTRAVASCULAR PRESSURE WIRE/FFR STUDY;  Surgeon: Sherren Mocha, MD;  Location: [place] CV LAB;  Service: Cardiovascular;  Laterality: N/A;  . KNEE ARTHROSCOPY  2000  . LEFT HEART CATH AND CORONARY ANGIOGRAPHY N/A 02/24/2018   Procedure: LEFT HEART CATH AND CORONARY ANGIOGRAPHY;  Surgeon: Sherren Mocha, MD;  Location: [place] CV LAB;  Service: Cardiovascular;  Laterality: N/A;  . SHOULDER ARTHROSCOPY WITH ROTATOR CUFF REPAIR AND SUBACROMIAL DECOMPRESSION Right 07/15/2012   Procedure: RIGHT ARTHROSCOPY SHOULDER DECOMPRESSION  SUBACROMIAL PARTIAL ACROMIOPLASTY WITH CORACOACROMIAL RELEASE,  DISTAL CLAVICULECTOMY, resect biceps debride labrium WITH ROTATOR CUFF REPAIR;  Surgeon: [name], MD;  Location: [facility];  Service:  Orthopedics;  Laterality: Right;    Family History  Problem Relation Age of Onset  . Heart failure Father   . Other Mother        bad fall  . Clotting disorder Mother   . Cancer Sister   . Hypertension Sister   . Heart disease Brother    Social History:  reports that he quit smoking about 46 years ago. His smoking use included cigarettes. He has a 20.00 pack-year smoking history. He has never used smokeless tobacco. He reports current alcohol use of about 2.0 standard drinks of alcohol per week. He reports that he does not use drugs.  Allergies:  Allergies  Allergen Reactions  . Lisinopril Cough    (Not in a hospital admission)   Results for orders placed or performed during the hospital encounter of 10/19/19 (from the past 48 hour(s))  SARS Coronavirus 2 by RT PCR (hospital order, performed in [facility] hospital lab) Nasopharyngeal Nasopharyngeal Swab     Status: None   Collection Time: 10/19/19 12:00 PM   Specimen: Nasopharyngeal Swab  Result Value Ref Range   SARS Coronavirus 2 NEGATIVE NEGATIVE    Comment: (NOTE) SARS-CoV-2 target nucleic acids are NOT DETECTED.  The SARS-CoV-2 RNA is generally detectable in upper and lower respiratory specimens during the acute phase of infection. The lowest concentration of SARS-CoV-2 viral copies this assay can detect is 250 copies / mL. A negative result does not preclude SARS-CoV-2 infection and should not be used as the sole basis for treatment or other patient management decisions.  A negative result may occur with improper specimen collection / handling, submission of specimen other than nasopharyngeal swab, presence of viral mutation(s) within the areas targeted by this assay, and inadequate number of viral copies (<250 copies / mL). A negative result must be combined with clinical observations, patient history, and epidemiological information.  Fact Sheet for Patients:   BoilerBrush.com.cy  Fact  Sheet for Healthcare Providers: https://pope.com/  This test is not yet approved or  cleared by the [place] FDA and has been authorized for detection and/or diagnosis of SARS-CoV-2 by FDA under an Emergency Use Authorization (EUA).  This EUA will remain in effect (meaning this test can be used) for the duration of the COVID-19 declaration under Section 564(b)(1) of the Act, 21 U.S.C. section 360bbb-3(b)(1), unless the authorization is terminated or revoked sooner.  Performed at [facility], [address], [place], [place] [address]    DG Hand Complete Left  Result Date: 10/19/2019 CLINICAL DATA:  Fall, laceration, initial encounter. EXAM: LEFT HAND - COMPLETE 3+ VIEW COMPARISON:  None. FINDINGS: There is a slightly displaced and obliquely oriented fracture involving the base of the fifth proximal phalanx, with extension to the fifth metacarpophalangeal joint. Approximately 2 mm medial displacement of the distal fracture fragment. Overlying soft tissue swelling. Degenerative changes at the first carpometacarpal joint. IMPRESSION: 1. Fracture at the base of the fifth proximal phalanx with involvement of the fifth metacarpophalangeal joint. 2. First carpometacarpal joint osteoarthritis. Electronically Signed   By: Leanna Battles M.D.   On: 10/19/2019 11:01    ROS NO RECENT ILLNESSES OR HOSPITALIZATIONS  Blood pressure 137/69, pulse (!) 54, temperature 98.4 F (36.9 C), resp. rate 12, SpO2 97 %. Physical Exam   General Appearance:  Alert,  cooperative, no distress, appears stated age  Head:  Normocephalic, without obvious abnormality, atraumatic  Eyes:  Pupils equal, conjunctiva/corneas clear,         Throat: Lips, mucosa, and tongue normal; teeth and gums normal  Neck: No visible masses     Lungs:   respirations unlabored  Chest Wall:  No tenderness or deformity  Heart:  Regular rate and rhythm,  Abdomen:   Soft, non-tender,          Extremities: LEFT HAND: WOUND VOLAR HAND ULNAR BORDER OF LEFT SMALL FINGER, GOOD MOBILITY TO LONG,INDEX AND THUMB, FINGERS WARM WELL PERFUSED  Pulses: 2+ and symmetric  Skin: Skin color, texture, turgor normal, no rashes or lesions     Neurologic: Normal    Assessment/Plan LEFT SMALL FINGER OPEN PROXIMAL PHALANX FRACTURE, DISPLACED  LEFT SMALL FINGER IRRIGATION AND DEBRIDEMENT AND REPAIR AS INDICATED  R/B/A DISCUSSED WITH PT IN OFFICE.  PT VOICED UNDERSTANDING OF PLAN CONSENT SIGNED DAY OF SURGERY PT SEEN AND EXAMINED PRIOR TO OPERATIVE PROCEDURE/DAY OF SURGERY SITE MARKED. QUESTIONS ANSWERED WILL GO HOME FOLLOWING SURGERY  WE ARE PLANNING SURGERY FOR YOUR UPPER EXTREMITY. THE RISKS AND BENEFITS OF SURGERY INCLUDE BUT NOT LIMITED TO BLEEDING INFECTION, DAMAGE TO NEARBY NERVES ARTERIES TENDONS, FAILURE OF SURGERY TO ACCOMPLISH ITS INTENDED GOALS, PERSISTENT SYMPTOMS AND NEED FOR FURTHER SURGICAL INTERVENTION. WITH THIS IN MIND WE WILL PROCEED. I HAVE DISCUSSED WITH THE PATIENT THE PRE AND POSTOPERATIVE REGIMEN AND THE DOS AND DON'TS. PT VOICED UNDERSTANDING AND INFORMED CONSENT SIGNED.  Sharma Covert 10/19/2019, 3:25 PM

## 2019-10-19 NOTE — Discharge Instructions (Signed)
KEEP BANDAGE CLEAN AND DRY CALL OFFICE FOR F/U APPT [phone] IN 10 DAYS DO NOT REMOVE SPLINT KEEP HAND ELEVATED ABOVE HEART OK TO APPLY ICE TO OPERATIVE AREA CONTACT OFFICE IF ANY WORSENING PAIN OR CONCERNS.   Post Anesthesia Home Care Instructions  Activity: Get plenty of rest for the remainder of the day. A responsible individual must stay with you for 24 hours following the procedure.  For the next 24 hours, DO NOT: -Drive a car -Advertising copywriter -Drink alcoholic beverages -Take any medication unless instructed by your physician -Make any legal decisions or sign important papers.  Meals: Start with liquid foods such as gelatin or soup. Progress to regular foods as tolerated. Avoid greasy, spicy, heavy foods. If nausea and/or vomiting occur, drink only clear liquids until the nausea and/or vomiting subsides. Call your physician if vomiting continues.  Special Instructions/Symptoms: Your throat may feel dry or sore from the anesthesia or the breathing tube placed in your throat during surgery. If this causes discomfort, gargle with warm salt water. The discomfort should disappear within 24 hours.  If you had a scopolamine patch placed behind your ear for the management of post- operative nausea and/or vomiting:  1. The medication in the patch is effective for 72 hours, after which it should be removed.  Wrap patch in a tissue and discard in the trash. Wash hands thoroughly with soap and water. 2. You may remove the patch earlier than 72 hours if you experience unpleasant side effects which may include dry mouth, dizziness or visual disturbances. 3. Avoid touching the patch. Wash your hands with soap and water after contact with the patch.

## 2019-10-19 NOTE — Transfer of Care (Signed)
Immediate Anesthesia Transfer of Care Note  Patient: Ronald Hull  Procedure(s) Performed: CLOSED REDUCTION METACARPAL WITH PERCUTANEOUS PINNING (Left Finger)  Patient Location: PACU  Anesthesia Type:General  Level of Consciousness: awake, alert , oriented and patient cooperative  Airway & Oxygen Therapy: Patient Spontanous Breathing and Patient connected to face mask oxygen  Post-op Assessment: Report given to RN and Post -op Vital signs reviewed and stable  Post vital signs: Reviewed and stable  Last Vitals:  Vitals Value Taken Time  BP    Temp    Pulse 70 10/19/19 1701  Resp 14 10/19/19 1701  SpO2 97 % 10/19/19 1701  Vitals shown include unvalidated device data.  Last Pain: There were no vitals filed for this visit.       Complications: No complications documented.

## 2019-10-19 NOTE — Anesthesia Preprocedure Evaluation (Addendum)
Anesthesia Evaluation  Patient identified by MRN, date of birth, ID band Patient awake    Reviewed: Allergy & Precautions, NPO status , Patient's Chart, lab work & pertinent test results  History of Anesthesia Complications (+) history of anesthetic complications  Airway Mallampati: II  TM Distance: >3 FB Neck ROM: Full    Dental  (+) Dental Advisory Given, Teeth Intact   Pulmonary sleep apnea , former smoker,    Pulmonary exam normal breath sounds clear to auscultation       Cardiovascular hypertension, Pt. on medications + CAD  Normal cardiovascular exam Rhythm:Regular Rate:Normal     Neuro/Psych negative neurological ROS  negative psych ROS   GI/Hepatic negative GI ROS, Neg liver ROS,   Endo/Other  negative endocrine ROS  Renal/GU negative Renal ROS     Musculoskeletal  (+) Arthritis ,   Abdominal   Peds  Hematology negative hematology ROS (+)   Anesthesia Other Findings   Reproductive/Obstetrics negative OB ROS                                                             Anesthesia Evaluation  Patient identified by MRN, date of birth, ID band Patient awake    Reviewed: Allergy & Precautions, NPO status , Patient's Chart, lab work & pertinent test results  Airway Mallampati: III  TM Distance: >3 FB Neck ROM: Full    Dental  (+) Teeth Intact, Dental Advisory Given   Pulmonary sleep apnea , former smoker,    breath sounds clear to auscultation       Cardiovascular hypertension, Pt. on home beta blockers and Pt. on medications + CAD   Rhythm:Irregular Rate:Normal     Neuro/Psych negative neurological ROS  negative psych ROS   GI/Hepatic negative GI ROS, Neg liver ROS,   Endo/Other  negative endocrine ROS  Renal/GU negative Renal ROS     Musculoskeletal   Abdominal (+) + obese,   Peds  Hematology negative hematology ROS (+)   Anesthesia Other  Findings   Reproductive/Obstetrics                            Anesthesia Physical Anesthesia Plan  ASA: III  Anesthesia Plan: General   Post-op Pain Management:    Induction: Intravenous  PONV Risk Score and Plan: 1 and Ondansetron and Treatment may vary due to age or medical condition  Airway Management Planned: Oral ETT  Additional Equipment: None  Intra-op Plan:   Post-operative Plan: Extubation in OR  Informed Consent: I have reviewed the patients History and Physical, chart, labs and discussed the procedure including the risks, benefits and alternatives for the proposed anesthesia with the patient or authorized representative who has indicated his/her understanding and acceptance.     Dental advisory given  Plan Discussed with: CRNA  Anesthesia Plan Comments: (Echo:  - Left ventricle: The cavity size was normal. Wall thickness was  increased in a pattern of mild LVH. Systolic function was normal.  The estimated ejection fraction was in the range of 60% to 65%.  Wall motion was normal; there were no regional wall motion  abnormalities. Doppler parameters are consistent with restrictive  physiology, indicative of decreased left ventricular diastolic  compliance and/or increased left atrial pressure. Doppler  parameters are consistent with high ventricular filling pressure.  - Aortic valve: Mildly calcified annulus. Trileaflet. There was  mild regurgitation.  - Mitral valve: Moderately calcified annulus. Normal thickness  leaflets . There was mild regurgitation.  - Tricuspid valve: There was mild regurgitation.  - Systemic veins: IVC poorly visualized but appears dilated. )      Anesthesia Quick Evaluation  Anesthesia Physical Anesthesia Plan  ASA: III  Anesthesia Plan: General   Post-op Pain Management:    Induction: Intravenous  PONV Risk Score and Plan: 3 and Ondansetron and Dexamethasone  Airway  Management Planned: LMA  Additional Equipment: None  Intra-op Plan:   Post-operative Plan: Extubation in OR  Informed Consent: I have reviewed the patients History and Physical, chart, labs and discussed the procedure including the risks, benefits and alternatives for the proposed anesthesia with the patient or authorized representative who has indicated his/her understanding and acceptance.     Dental advisory given  Plan Discussed with: CRNA  Anesthesia Plan Comments:         Anesthesia Quick Evaluation

## 2019-10-19 NOTE — Anesthesia Procedure Notes (Signed)
Procedure Name: LMA Insertion Date/Time: 10/19/2019 4:15 PM Performed by: Ruberta Holck, Jewel Baize, CRNA Pre-anesthesia Checklist: Patient identified, Emergency Drugs available, Suction available and Patient being monitored Patient Re-evaluated:Patient Re-evaluated prior to induction Oxygen Delivery Method: Circle system utilized Preoxygenation: Pre-oxygenation with 100% oxygen Induction Type: IV induction Ventilation: Mask ventilation without difficulty LMA: LMA inserted LMA Size: 4.0 Number of attempts: 1 Airway Equipment and Method: Bite block Placement Confirmation: positive ETCO2 Tube secured with: Tape Dental Injury: Teeth and Oropharynx as per pre-operative assessment

## 2019-10-19 NOTE — ED Triage Notes (Addendum)
Patient reports he was walking yesterday and tripped. He fell and hit his left hand and arm. Reports minor abrasions on this arm/elbow. Reports a "large gash" in webbing between the fourth and fifth digits. Also reports pink pain.   Patient takes eliquis.

## 2019-10-19 NOTE — ED Provider Notes (Signed)
[facility]    CSN: [id number] Arrival date & time: 10/19/19  4268      History   Chief Complaint Chief Complaint  Patient presents with  . Laceration    HPI ALEXES LAMARQUE is a 74 y.o. male.   Patient is a 74 year old male with past medical history of arthritis, bronchitis, CAD, hypertension, hypercholesterolemia, persistent A. fib on Eliquis.  He presents today with laceration between the fourth and fifth digits.  This occurred last night when he fell onto concrete.  He since had continued bleeding and pain from the area.  Limited range of motion of the fifth digit.  Minor abrasions to arm/elbow and bridge of nose from glasses.  Denies hitting head or any loss of consciousness.  ROS per HPI      Past Medical History:  Diagnosis Date  . Arthritis   . Bronchitis 05/23/2018  . CAD (coronary artery disease) 02/24/2018   LHC 10/19: pLAD 50, mLAD 50 (dFFR=0.89), oLCx 95, pRCA 30, mRCA 80 >> PCI: DES to LCx; DES to RCA // [id number] Trial participant (will DC Plavix after 30 d and remain on ASA + Apixaban)  . Chest pain secondary to ablation, with expected mild elevation in troponin 05/22/2018  . Complication of anesthesia    BP dropped post colonoscopy  . Hypercholesteremia   . Hypertension   . Lung nodules 02/25/2019   Chest CT 01/2019: LUL nodule unchanged (benign); new LUL nodules 6 mm, aortic atherosclerosis, emphysema, partially calcified pleural plaques suggesting prior empyema or hemothorax  . Persistent atrial fibrillation (HCC)   . Sleep apnea    does use a cpap  . Wears glasses     Patient Active Problem List   Diagnosis Date Noted  . Secondary hypercoagulable state (HCC) 04/05/2019  . Lung nodules 02/25/2019  . Paroxysmal atrial fibrillation (HCC)   . Bronchitis 05/23/2018  . Chest pain secondary to ablation, with expected mild elevation in troponin 05/22/2018  . Hyperlipidemia 03/12/2018  . Essential hypertension 03/12/2018  . CAD (coronary  artery disease) 02/24/2018  . Persistent atrial fibrillation     Past Surgical History:  Procedure Laterality Date  . ATRIAL FIBRILLATION ABLATION N/A 05/20/2018   Procedure: ATRIAL FIBRILLATION ABLATION;  Surgeon: Regan Lemming, MD;  [facility]: [facility];  Service: Cardiovascular;  Laterality: N/A;  . ATRIAL FIBRILLATION ABLATION N/A 09/02/2019   Procedure: ATRIAL FIBRILLATION ABLATION;  Surgeon: Regan Lemming, MD;  Location: [facility];  Service: Cardiovascular;  Laterality: N/A;  . CARDIOVERSION N/A 11/01/2015   Procedure: CARDIOVERSION;  Surgeon: [name], MD;  Location: [facility] ENDOSCOPY;  Service: Cardiovascular;  Laterality: N/A;  . CARDIOVERSION N/A 11/23/2015   Procedure: CARDIOVERSION;  Surgeon: Will [name], MD;  Location: [facility] ENDOSCOPY;  Service: Cardiovascular;  Laterality: N/A;  . CARDIOVERSION N/A 10/12/2017   Procedure: CARDIOVERSION;  Surgeon: [name], MD;  Location: [facility] ENDOSCOPY;  Service: Cardiovascular;  Laterality: N/A;  . CARDIOVERSION N/A 06/30/2018   Procedure: CARDIOVERSION;  Surgeon: Jake Bathe, MD;  Location: [facility] ENDOSCOPY;  Service: Cardiovascular;  Laterality: N/A;  . CARDIOVERSION N/A 09/22/2018   Procedure: CARDIOVERSION;  Surgeon: Quintella Reichert, MD;  Location: [facility] ENDOSCOPY;  Service: Cardiovascular;  Laterality: N/A;  . CARDIOVERSION N/A 07/15/2019   Procedure: CARDIOVERSION;  Surgeon: Jodelle Red, MD;  Location: [facility] ENDOSCOPY;  Service: Cardiovascular;  Laterality: N/A;  . COLONOSCOPY    . CORONARY STENT INTERVENTION N/A 02/24/2018   Procedure: CORONARY STENT INTERVENTION;  Surgeon:  Sherren Mocha, MD;  Location: [place] CV LAB;  Service: Cardiovascular;  Laterality: N/A;  . INTRAVASCULAR PRESSURE WIRE/FFR STUDY N/A 02/24/2018   Procedure: INTRAVASCULAR PRESSURE WIRE/FFR STUDY;  Surgeon: Sherren Mocha, MD;  Location: [place] CV [facility];  Service: Cardiovascular;  Laterality: N/A;  . KNEE ARTHROSCOPY   2000  . LEFT HEART CATH AND CORONARY ANGIOGRAPHY N/A 02/24/2018   Procedure: LEFT HEART CATH AND CORONARY ANGIOGRAPHY;  Surgeon: Sherren Mocha, MD;  Location: [place] CV LAB;  Service: Cardiovascular;  Laterality: N/A;  . SHOULDER ARTHROSCOPY WITH ROTATOR CUFF REPAIR AND SUBACROMIAL DECOMPRESSION Right 07/15/2012   Procedure: RIGHT ARTHROSCOPY SHOULDER DECOMPRESSION  SUBACROMIAL PARTIAL ACROMIOPLASTY WITH CORACOACROMIAL RELEASE,  DISTAL CLAVICULECTOMY, resect biceps debride labrium WITH ROTATOR CUFF REPAIR;  Surgeon: Ninetta Lights, MD;  Location: [facility];  Service: Orthopedics;  Laterality: Right;       Home Medications    Prior to Admission medications   Medication Sig Start Date End Date Taking? Authorizing Provider  acetaminophen (TYLENOL) 325 MG tablet Take 325 mg by mouth every 6 (six) hours as needed (for pain.).     [provider]  amiodarone (PACERONE) 200 MG tablet Take 1 tablet (200 mg total) by mouth daily. 03/22/19   Camnitz, Ocie Doyne, MD  ASPIRIN LOW DOSE 81 MG chewable tablet Chew 81 mg by mouth every evening.  02/25/18   [provider]  atorvastatin (LIPITOR) 40 MG tablet TAKE 1 TABLET BY MOUTH DAILY Patient taking differently: 20 mg.  08/12/19   Sherren Mocha, MD  ELIQUIS 5 MG TABS tablet TAKE 1 TABLET(5 MG) BY MOUTH TWICE DAILY 06/13/19   Camnitz, Ocie Doyne, MD  furosemide (LASIX) 20 MG tablet Take 1-2 tablets (20-40 mg total) by mouth daily as needed. 09/04/19   Barrett, Evelene Croon, PA-C  losartan (COZAAR) 50 MG tablet Take 1 tablet (50 mg total) by mouth daily. 10/19/19   Fenton, Clint R, PA  metoprolol tartrate (LOPRESSOR) 25 MG tablet Take 1 tablet (25 mg total) by mouth 2 (two) times daily. 10/19/19   Fenton, Clint R, PA  nitroGLYCERIN (NITROSTAT) 0.4 MG SL tablet PLACE ONE TABLET UNDER TONGUE AS NEEDED FOR CHEST PAIN EVERY 5 MINUTES AS NEEDED Patient taking differently: Place 0.4 mg under the tongue every 5 (five) minutes as  needed for chest pain.  01/07/19   Camnitz, Ocie Doyne, MD  zaleplon (SONATA) 5 MG capsule Take 5 mg by mouth at bedtime as needed for sleep.    [provider]    Family History Family History  Problem Relation Age of Onset  . Heart failure Father   . Other Mother        bad fall  . Clotting disorder Mother   . Cancer Sister   . Hypertension Sister   . Heart disease Brother     Social History Social History   Tobacco Use  . Smoking status: Former Smoker    Packs/day: 2.00    Years: 10.00    Pack years: 20.00    Types: Cigarettes    Quit date: 07/09/1973    Years since quitting: 46.3  . Smokeless tobacco: Never Used  Vaping Use  . Vaping Use: Never used  Substance Use Topics  . Alcohol use: Yes    Alcohol/week: 2.0 standard drinks    Types: 2 Glasses of wine per week  . Drug use: No     Allergies   Lisinopril   Review of Systems Review of Systems  Physical Exam Triage Vital Signs ED Triage Vitals  Enc Vitals Group     BP 10/19/19 0940 137/69     Pulse Rate 10/19/19 0940 (!) 54     Resp 10/19/19 0940 12     Temp 10/19/19 0940 98.4 F (36.9 C)     Temp src --      SpO2 10/19/19 0940 97 %     Weight --      Height --      Head Circumference --      Peak Flow --      Pain Score 10/19/19 0938 3     Pain Loc --      Pain Edu? --      Excl. in GC? --    No data found.  Updated Vital Signs BP 137/69   Pulse (!) 54   Temp 98.4 F (36.9 C)   Resp 12   SpO2 97%   Visual Acuity Right Eye Distance:   Left Eye Distance:   Bilateral Distance:    Right Eye Near:   Left Eye Near:    Bilateral Near:     Physical Exam Vitals and nursing note reviewed.  Constitutional:      Appearance: Normal appearance.  HENT:     Head: Normocephalic and atraumatic.     Nose: Nose normal.  Eyes:     Conjunctiva/sclera: Conjunctivae normal.  Pulmonary:     Effort: Pulmonary effort is normal.  Musculoskeletal:        General: Normal range of motion.      Cervical back: Normal range of motion.     Comments: See picture for detail Limited ROM of the 5th digit Sensation intact  Skin:    General: Skin is warm and dry.  Neurological:     Mental Status: He is alert.  Psychiatric:        Mood and Affect: Mood normal.        UC Treatments / Results  Labs (all labs ordered are listed, but only abnormal results are displayed) Labs Reviewed  SARS CORONAVIRUS 2 (TAT 6-24 HRS)    EKG   Radiology DG Hand Complete Left  Result Date: 10/19/2019 CLINICAL DATA:  Fall, laceration, initial encounter. EXAM: LEFT HAND - COMPLETE 3+ VIEW COMPARISON:  None. FINDINGS: There is a slightly displaced and obliquely oriented fracture involving the base of the fifth proximal phalanx, with extension to the fifth metacarpophalangeal joint. Approximately 2 mm medial displacement of the distal fracture fragment. Overlying soft tissue swelling. Degenerative changes at the first carpometacarpal joint. IMPRESSION: 1. Fracture at the base of the fifth proximal phalanx with involvement of the fifth metacarpophalangeal joint. 2. First carpometacarpal joint osteoarthritis. Electronically Signed   By: Leanna Battles M.D.   On: 10/19/2019 11:01    Procedures Procedures (including critical care time)  Medications Ordered in UC Medications - No data to display  Initial Impression / Assessment and Plan / UC Course  I have reviewed the triage vital signs and the nursing notes.  Pertinent labs & imaging results that were available during my care of the patient were reviewed by me and considered in my medical decision making (see chart for details).     Displaced fifth proximal phalanx fracture.  Open fracture.  There is extension into the fifth metacarpal joint space.  Approximately 2 mm displacement of distal fracture fragment. Consulted with orthopedics on call Patient will receive Covid swab and if negative results will go to day surgery center  for outpatient  surgery Patient made aware. Will wait here in the lobby until he goes to surgery center.   Final Clinical Impressions(s) / UC Diagnoses   Final diagnoses:  Open displaced fracture of proximal phalanx of left little finger, initial encounter     Discharge Instructions     You have a fracture.  You are scheduled for surgery at 3 PM today. We will have you wait here in 2 your Covid swab is back.  At this is negative you will go next-door to the surgery center for surgery Nothing to eat or drink.    ED Prescriptions    None     PDMP not reviewed this encounter.   Janace Aris, NP 10/19/19 1215

## 2019-10-19 NOTE — Discharge Instructions (Addendum)
KEEP BANDAGE CLEAN AND DRY °CALL OFFICE FOR F/U APPT [phone] IN 10 DAYS °KEEP HAND ELEVATED ABOVE HEART °OK TO APPLY ICE TO OPERATIVE AREA °CONTACT OFFICE IF ANY WORSENING PAIN OR CONCERNS. °

## 2019-10-19 NOTE — Op Note (Signed)
PREOPERATIVE DIAGNOSIS: Left grade 2 open small finger proximal phalanx fracture  POSTOPERATIVE DIAGNOSIS: Same  ATTENDING SURGEON: Dr. Bradly Bienenstock who scrubbed and present for the entire procedure  ASSISTANT SURGEON: None  ANESTHESIA: General via laryngeal mask airway  OPERATIVE PROCEDURE: Open debridement of skin subcutaneous tissue and bone associated with left small finger proximal phalanx open fracture Open treatment of left small finger proximal phalanx fracture requiring internal fixation Radiographs 3 views left hand  IMPLANTS: Two 0.045 K wires  RADIOGRAPHIC INTERPRETATION: AP lateral oblique views of the hand do show the cross K wire fixation in place with good alignment of the small finger proximal phalanx  SURGICAL INDICATIONS: Patient is a right-hand-dominant gentleman who fell yesterday sustaining the open injury.  Patient presented to the urgent care today with the open injury.  Patient was greater than 12 hours after his injury.  Risk benefits and alternatives were discussed in detail with the patient and signed informed consent was obtained.  Risks include but not limited to bleeding infection damage nearby nerves arteries or tendons loss of motion of the wrist and digits incomplete relief of symptoms and need for further surgical invention.  SURGICAL TECHNIQUE: Patient was palpated find the preoperative holding area marked apart a marker made on left small finger to indicate correct operative site.  Patient brought back operating placed supine on anesthesia table where the general anesthetic was administered.  Preoperative antibiotics were given prior to any skin incision.  Well-padded tourniquet placed on the left brachium seal with the appropriate drape.  Left upper extremities then prepped and draped normal sterile fashion.  A timeout was called the correct site was then defined procedure then begun.  Attention was then turned to the small finger.  The open wound over the  volar aspect of the finger was opened up excisional region of the skin subcutaneous tissue was then done of the devitalized tissue in the contaminated tissue with sharp scissors.  This was also done with a sharp knife.  The devitalized tissue was taken all the way down to the bone.  Careful retraction was then done on the radial ulnar aspects protecting the neurovascular bundle..  The fracture site was opened up and thorough irrigation was then done and removal small cancellous portions of the bone were then done.  After thorough wound irrigation the volar wound was then loosely reapproximated and closed with simple sutures.  Attention was then turned dorsally where close manipulation was then successful in maintaining reduction and then two 0.045 K wires were then placed across the phalangeal shaft.  Patient tolerated this well.  Final radiographs were then obtained.  Once this done of the hand Xeroform bolster dressings were applied around the pin sites K wires were cut and bent left out of the skin Xeroform applied to the wound volarly.  Sterile compressive bandage applied.  The patient was then placed in a well-padded ulnar gutter splint extubated taken recovery in good condition.  POSTOPERATIVE PLAN: Patient be discharged home.  See him back in the office in 10 days for wound check.  Down to see our therapist for splint.  Likely sutures out around the 2-week mark.  Radiographs at each visit.  K wires out at the 4-week mark.

## 2019-10-20 ENCOUNTER — Encounter (HOSPITAL_BASED_OUTPATIENT_CLINIC_OR_DEPARTMENT_OTHER): Payer: Self-pay | Admitting: Orthopedic Surgery

## 2019-10-20 NOTE — Anesthesia Postprocedure Evaluation (Signed)
Anesthesia Post Note  Patient: Ronald Hull  Procedure(s) Performed: OPEN REDUCTION INTERNAL FIXATION (ORIF) PROXIMAL PHALANX WITH K-WIRES LEFT SMALL (Left Finger) IRRIGATION AND DEBRIDEMENT LEFT SMALL PROXIMAL PHALANX (Left Hand)     Patient location during evaluation: PACU Anesthesia Type: General Level of consciousness: sedated and patient cooperative Pain management: pain level controlled Vital Signs Assessment: post-procedure vital signs reviewed and stable Respiratory status: spontaneous breathing Cardiovascular status: stable Anesthetic complications: no   No complications documented.  Last Vitals:  Vitals:   10/19/19 1718 10/19/19 1730  BP:  (!) 173/84  Pulse: 68 64  Resp: 13 16  Temp:  36.5 C  SpO2: 96% 95%    Last Pain:  Vitals:   10/19/19 1730  PainSc: 4                  Jaeceon Michelin [employer]

## 2019-10-24 DIAGNOSIS — S62619A Displaced fracture of proximal phalanx of unspecified finger, initial encounter for closed fracture: Secondary | ICD-10-CM | POA: Insufficient documentation

## 2019-10-24 HISTORY — DX: Displaced fracture of proximal phalanx of unspecified finger, initial encounter for closed fracture: S62.619A

## 2019-10-28 DIAGNOSIS — Z4789 Encounter for other orthopedic aftercare: Secondary | ICD-10-CM | POA: Diagnosis not present

## 2019-10-28 DIAGNOSIS — M79645 Pain in left finger(s): Secondary | ICD-10-CM | POA: Diagnosis not present

## 2019-10-28 DIAGNOSIS — S62617D Displaced fracture of proximal phalanx of left little finger, subsequent encounter for fracture with routine healing: Secondary | ICD-10-CM | POA: Diagnosis not present

## 2019-11-08 DIAGNOSIS — Z4789 Encounter for other orthopedic aftercare: Secondary | ICD-10-CM | POA: Diagnosis not present

## 2019-11-08 DIAGNOSIS — S62617D Displaced fracture of proximal phalanx of left little finger, subsequent encounter for fracture with routine healing: Secondary | ICD-10-CM | POA: Diagnosis not present

## 2019-11-17 DIAGNOSIS — S62617D Displaced fracture of proximal phalanx of left little finger, subsequent encounter for fracture with routine healing: Secondary | ICD-10-CM | POA: Diagnosis not present

## 2019-11-17 DIAGNOSIS — Z4789 Encounter for other orthopedic aftercare: Secondary | ICD-10-CM | POA: Diagnosis not present

## 2019-11-18 ENCOUNTER — Telehealth (HOSPITAL_COMMUNITY): Payer: Self-pay

## 2019-11-18 NOTE — Telephone Encounter (Signed)
Patient called back at 11am and his heart rate decreased to 72-73 and his blood pressure was 136/87. He is still currently in A-fib. He was advised about being in the honeymoon stage for A-fib post ablation. Per [name]- PA his blood pressure looks okay and he should continue to take the extra Metoprolol 25mg  tablet up to twice daily if his heart rate is above 100. He is scheduled for Monday to see Ricky. He was told to contact the A fib clinic if he was to flip back in normal sinus rhythm. Consulted with patient and he verbalized understanding.

## 2019-11-18 NOTE — Telephone Encounter (Signed)
Patient called and states he has been in A-fib since last night about 8pm. His heart rate is running in the 90s- 100s and his blood pressure is 140/90. Per [name]- PA-  Advise patient to take an extra tablet of his Metoprolol 25mg  tablet and to call back at 11am. Consulted with patient and he verbalized understanding.

## 2019-11-21 ENCOUNTER — Other Ambulatory Visit: Payer: Self-pay

## 2019-11-21 ENCOUNTER — Ambulatory Visit (HOSPITAL_COMMUNITY)
Admission: RE | Admit: 2019-11-21 | Discharge: 2019-11-21 | Disposition: A | Discharge status: Home / Self Care | Payer: Medicare Other | Source: Ambulatory Visit | Attending: Nurse Practitioner | Admitting: Nurse Practitioner

## 2019-11-21 ENCOUNTER — Encounter (HOSPITAL_COMMUNITY): Payer: Self-pay | Admitting: Nurse Practitioner

## 2019-11-21 VITALS — BP 142/78 | HR 74 | Ht 71.0 in | Wt 278.4 lb

## 2019-11-21 DIAGNOSIS — E669 Obesity, unspecified: Secondary | ICD-10-CM | POA: Diagnosis not present

## 2019-11-21 DIAGNOSIS — Z87891 Personal history of nicotine dependence: Secondary | ICD-10-CM | POA: Diagnosis not present

## 2019-11-21 DIAGNOSIS — Z7982 Long term (current) use of aspirin: Secondary | ICD-10-CM | POA: Insufficient documentation

## 2019-11-21 DIAGNOSIS — I2510 Atherosclerotic heart disease of native coronary artery without angina pectoris: Secondary | ICD-10-CM | POA: Diagnosis not present

## 2019-11-21 DIAGNOSIS — Z8249 Family history of ischemic heart disease and other diseases of the circulatory system: Secondary | ICD-10-CM | POA: Insufficient documentation

## 2019-11-21 DIAGNOSIS — Z7901 Long term (current) use of anticoagulants: Secondary | ICD-10-CM | POA: Insufficient documentation

## 2019-11-21 DIAGNOSIS — G4733 Obstructive sleep apnea (adult) (pediatric): Secondary | ICD-10-CM | POA: Insufficient documentation

## 2019-11-21 DIAGNOSIS — I10 Essential (primary) hypertension: Secondary | ICD-10-CM | POA: Insufficient documentation

## 2019-11-21 DIAGNOSIS — Z79899 Other long term (current) drug therapy: Secondary | ICD-10-CM | POA: Insufficient documentation

## 2019-11-21 DIAGNOSIS — I4819 Other persistent atrial fibrillation: Secondary | ICD-10-CM | POA: Insufficient documentation

## 2019-11-21 DIAGNOSIS — Z6838 Body mass index (BMI) 38.0-38.9, adult: Secondary | ICD-10-CM | POA: Diagnosis not present

## 2019-11-21 DIAGNOSIS — D6869 Other thrombophilia: Secondary | ICD-10-CM

## 2019-11-21 LAB — BASIC METABOLIC PANEL
Anion gap: 7 (ref 5–15)
BUN: 12 mg/dL (ref 8–23)
CO2: 24 mmol/L (ref 22–32)
Calcium: 8.5 mg/dL — ABNORMAL LOW (ref 8.9–10.3)
Chloride: 103 mmol/L (ref 98–111)
Creatinine, Ser: 1.18 mg/dL (ref 0.61–1.24)
GFR calc Af Amer: >60 mL/min (ref >60)
GFR calc non Af Amer: >60 mL/min (ref >60)
Glucose, Bld: 144 mg/dL — ABNORMAL HIGH (ref 70–99)
Potassium: 4.2 mmol/L (ref 3.5–5.1)
Sodium: 134 mmol/L — ABNORMAL LOW (ref 135–145)

## 2019-11-21 LAB — CBC
HCT: 45.3 % (ref 39.0–52.0)
Hemoglobin: 15.1 g/dL (ref 13.0–17.0)
MCH: 32.8 pg (ref 26.0–34.0)
MCHC: 33.3 g/dL (ref 30.0–36.0)
MCV: 98.3 fL (ref 80.0–100.0)
Platelets: 193 10*3/uL (ref 150–400)
RBC: 4.61 MIL/uL (ref 4.22–5.81)
RDW: 13.5 % (ref 11.5–15.5)
WBC: 7.4 10*3/uL (ref 4.0–10.5)
nRBC: 0 % (ref 0.0–0.2)

## 2019-11-21 NOTE — H&P (View-Only) (Signed)
 Primary Care Physician: Polite, Ronald, MD Referring Physician: Dr. Camnitz Cardiologist: Dr. Cooper    Ronald Hull is a 74 y.o. male with a h/o persistent afib, CAD, that is in the afib clinic for f/u.  He presented to his primary physician on 10/02/15 with an elevated and irregular heart rate. Cardioversion was attempted and was unsuccessful with repeat cardioversion successfully to 360 J. He was initially placed on amiodarone and has since been switched to Multaq. He went back into atrial fibrillation and had cardioversion 10/12/17.  He was scheduled for ablation, but on his pre-ablation CT scan was found to have coronary artery disease.  He was sent to catheterization and had a drug-eluting stent to his circumflex and RCA on 02/24/2018.  He had A. fib ablation January 2020 and has been started on amiodarone. Patient underwent successful DCCV on 09/22/18. He is on Eliquis for a CHADS2VASC score of 3.   Patient had recurrent afib and underwent DCCV on 07/15/19 but did not convert to SR. He underwent repeat ablation with Dr Camnitz on 09/02/19. He denies any symptoms of afib since the procedure.   He is now back in the clinic, 7/26 as he developed afib late last week. He took extra BB over the weekend but remains in rate controlled afib today. He did miss one does of DOAC 7/19. He was back on both doses on 7/20 and has not missed any more. He recently broke his left wrist and is questioning if stress around this may have been his trigger.   Today, he denies symptoms of palpitations, chest pain, orthopnea, PND, lower extremity edema, dizziness, presyncope, syncope, or neurologic sequela. The patient is tolerating medications without difficulties and is otherwise without complaint today.   Past Medical History:  Diagnosis Date  . Arthritis   . Bronchitis 05/23/2018  . CAD (coronary artery disease) 02/24/2018   LHC 10/19: pLAD 50, mLAD 50 (dFFR=0.89), oLCx 95, pRCA 30, mRCA 80 >> PCI: DES to LCx;  DES to RCA // [id number] Trial participant (will DC Plavix after 30 d and remain on ASA + Apixaban)  . Chest pain secondary to ablation, with expected mild elevation in troponin 05/22/2018  . Complication of anesthesia    BP dropped post colonoscopy  . Hypercholesteremia   . Hypertension   . Lung nodules 02/25/2019   Chest CT 01/2019: LUL nodule unchanged (benign); new LUL nodules 6 mm, aortic atherosclerosis, emphysema, partially calcified pleural plaques suggesting prior empyema or hemothorax  . Persistent atrial fibrillation (HCC)   . Sleep apnea    does use a cpap  . Wears glasses    Past Surgical History:  Procedure Laterality Date  . ATRIAL FIBRILLATION ABLATION N/A 05/20/2018   Procedure: ATRIAL FIBRILLATION ABLATION;  Surgeon: Camnitz, Will Martin, MD;  Location: [facility];  Service: Cardiovascular;  Laterality: N/A;  . ATRIAL FIBRILLATION ABLATION N/A 09/02/2019   Procedure: ATRIAL FIBRILLATION ABLATION;  Surgeon: Camnitz, Will Martin, MD;  Location: [facility];  Service: Cardiovascular;  Laterality: N/A;  . CARDIOVERSION N/A 11/01/2015   Procedure: CARDIOVERSION;  Surgeon: Kenneth C Hilty, MD;  Location: [facility] ENDOS[facility];  Service: Cardiovascular;  Laterality: N/A;  . CARDIOVERSION N/A 11/23/2015   Procedure: CARDIOVERSION;  Surgeon: Will Martin Camnitz, MD;  Location: [facility]OS[facility];  Service: Cardiovascular;  Laterality: N/A;  . CARDIOVERSION N/A 10/12/2017   Procedure: CARDIOVERSION;  Surgeon: Hilty, Kenneth C, MD;  Location: [facility]OS[facility];  Service: Cardiovascular;  Laterality: N/A;  . CARDIOVERSION N/A 06/30/2018     Procedure: CARDIOVERSION;  Surgeon: Jake Bathe, MD;  Location: [facility] ENDOSCOPY;  Service: Cardiovascular;  Laterality: N/A;  . CARDIOVERSION N/A 09/22/2018   Procedure: CARDIOVERSION;  Surgeon: Quintella Reichert, MD;  Location: [facility] ENDOSCOPY;  Service: Cardiovascular;  Laterality: N/A;  . CARDIOVERSION N/A 07/15/2019   Procedure: CARDIOVERSION;  Surgeon: Jodelle Red, MD;  Location: [facility] ENDOSCOPY;  Service: Cardiovascular;  Laterality: N/A;  . COLONOSCOPY    . CORONARY STENT INTERVENTION N/A 02/24/2018   Procedure: CORONARY STENT INTERVENTION;  Surgeon: Tonny Bollman, MD;  Location: [facility] INVASIVE CV LAB;  Service: Cardiovascular;  Laterality: N/A;  . I & D EXTREMITY Left 10/19/2019   Procedure: IRRIGATION AND DEBRIDEMENT LEFT SMALL PROXIMAL PHALANX;  Surgeon: Bradly Bienenstock, MD;  Location: [facility];  Service: Orthopedics;  Laterality: Left;  . INTRAVASCULAR PRESSURE WIRE/FFR STUDY N/A 02/24/2018   Procedure: INTRAVASCULAR PRESSURE WIRE/FFR STUDY;  Surgeon: Tonny Bollman, MD;  Location: [facility] INVASIVE CV LAB;  Service: Cardiovascular;  Laterality: N/A;  . KNEE ARTHROSCOPY  2000  . LEFT HEART CATH AND CORONARY ANGIOGRAPHY N/A 02/24/2018   Procedure: LEFT HEART CATH AND CORONARY ANGIOGRAPHY;  Surgeon: Tonny Bollman, MD;  Location: [facility] INVASIVE CV LAB;  Service: Cardiovascular;  Laterality: N/A;  . OPEN REDUCTION INTERNAL FIXATION (ORIF) PROXIMAL PHALANX Left 10/19/2019   Procedure: OPEN REDUCTION INTERNAL FIXATION (ORIF) PROXIMAL PHALANX WITH K-WIRES LEFT SMALL;  Surgeon: Bradly Bienenstock, MD;  Location: [facility];  Service: Orthopedics;  Laterality: Left;  . SHOULDER ARTHROSCOPY WITH ROTATOR CUFF REPAIR AND SUBACROMIAL DECOMPRESSION Right 07/15/2012   Procedure: RIGHT ARTHROSCOPY SHOULDER DECOMPRESSION  SUBACROMIAL PARTIAL ACROMIOPLASTY WITH CORACOACROMIAL RELEASE,  DISTAL CLAVICULECTOMY, resect biceps debride labrium WITH ROTATOR CUFF REPAIR;  Surgeon: [name], MD;  Location: [facility];  Service: Orthopedics;  Laterality: Right;    Current Outpatient Medications  Medication Sig Dispense Refill  . acetaminophen (TYLENOL) 325 MG tablet Take 325 mg by mouth as needed (for pain.).     Marland Kitchen amiodarone (PACERONE) 200 MG tablet Take 1 tablet (200 mg total) by mouth daily. 90 tablet 2  . ASPIRIN LOW DOSE 81 MG  chewable tablet Chew 81 mg by mouth every evening.   0  . atorvastatin (LIPITOR) 40 MG tablet TAKE 1 TABLET BY MOUTH DAILY (Patient taking differently: Take 20 mg by mouth 2 (two) times daily. ) 90 tablet 1  . ELIQUIS 5 MG TABS tablet TAKE 1 TABLET(5 MG) BY MOUTH TWICE DAILY 60 tablet 5  . furosemide (LASIX) 20 MG tablet Take 1-2 tablets (20-40 mg total) by mouth daily as needed. 30 tablet 1  . losartan (COZAAR) 50 MG tablet Take 1 tablet (50 mg total) by mouth daily. 90 tablet 2  . metoprolol tartrate (LOPRESSOR) 50 MG tablet metoprolol tartrate 50 mg tablet  TAKE 1/2 TABLET BY MOUTH TWICE DAILY    . nitroGLYCERIN (NITROSTAT) 0.4 MG SL tablet PLACE ONE TABLET UNDER TONGUE AS NEEDED FOR CHEST PAIN EVERY 5 MINUTES AS NEEDED (Patient taking differently: Place 0.4 mg under the tongue every 5 (five) minutes as needed for chest pain. ) 25 tablet 2  . zaleplon (SONATA) 5 MG capsule Take 5 mg by mouth at bedtime as needed for sleep.     No current facility-administered medications for this encounter.    No Known Allergies  Social History   Socioeconomic History  . Marital status: Married    Spouse name: Not on file  . Number of children: Not on file  . Years  of education: Not on file  . Highest education level: Not on file  Occupational History  . Not on file  Tobacco Use  . Smoking status: Former Smoker    Packs/day: 2.00    Years: 10.00    Pack years: 20.00    Types: Cigarettes    Quit date: 07/09/1973    Years since quitting: 46.4  . Smokeless tobacco: Never Used  Vaping Use  . Vaping Use: Never used  Substance and Sexual Activity  . Alcohol use: Yes    Alcohol/week: 2.0 standard drinks    Types: 2 Glasses of wine per week  . Drug use: No  . Sexual activity: Not Currently    Birth control/protection: Abstinence  Other Topics Concern  . Not on file  Social History Narrative  . Not on file   Social Determinants of Health   Financial Resource Strain:   . Difficulty of  Paying Living Expenses:   Food Insecurity:   . Worried About Programme researcher, broadcasting/film/video in the Last Year:   . Barista in the Last Year:   Transportation Needs:   . Freight forwarder (Medical):   Marland Kitchen Lack of Transportation (Non-Medical):   Physical Activity:   . Days of Exercise per Week:   . Minutes of Exercise per Session:   Stress:   . Feeling of Stress :   Social Connections:   . Frequency of Communication with Friends and Family:   . Frequency of Social Gatherings with Friends and Family:   . Attends Religious Services:   . Active Member of Clubs or Organizations:   . Attends Banker Meetings:   Marland Kitchen Marital Status:   Intimate Partner Violence:   . Fear of Current or Ex-Partner:   . Emotionally Abused:   Marland Kitchen Physically Abused:   . Sexually Abused:     Family History  Problem Relation Age of Onset  . Heart failure Father   . Other Mother        bad fall  . Clotting disorder Mother   . Cancer Sister   . Hypertension Sister   . Heart disease Brother     ROS- All systems are reviewed and negative except as per the HPI above  Physical Exam: Vitals:   11/21/19 1403  BP: (!) 142/78  Pulse: 74  Weight: (!) 126.3 kg  Height: 5\' 11"  (1.803 m)   Wt Readings from Last 3 Encounters:  11/21/19 (!) 126.3 kg  10/19/19 122.6 kg  09/30/19 125.9 kg    Labs: Lab Results  Component Value Date   NA 137 10/14/2019   K 4.7 10/14/2019   CL 108 10/14/2019   CO2 21 (L) 10/14/2019   GLUCOSE 82 10/14/2019   BUN 14 10/14/2019   CREATININE 1.18 10/14/2019   CALCIUM 8.5 (L) 10/14/2019   No results found for: INR Lab Results  Component Value Date   CHOL 96 (L) 04/09/2018   HDL 31 (L) 04/09/2018   LDLCALC 50 04/09/2018   TRIG 77 04/09/2018    GEN- The patient is well appearing obese male, alert and oriented x 3 today.   HEENT-head normocephalic, atraumatic, sclera clear, conjunctiva pink, hearing intact, trachea midline. Lungs- Clear to ausculation  bilaterally, normal work of breathing Heart- irregular rate and rhythm, no murmurs, rubs or gallops  GI- soft, NT, ND, + BS Extremities- no clubbing, cyanosis, or edema MS- no significant deformity or atrophy Skin- no rash or lesion Psych- euthymic mood, full affect  Neuro- strength and sensation are intact   EKG- afib at 874 bpm, qrs int 110 ms, qtc 430 ms   Echo 05/22/18 demonstrates - Procedure narrative: Transthoracic echocardiography. Image   quality was poor. The study was technically difficult, as a   result of poor acoustic windows. Intravenous contrast (Definity)   was administered. - Left ventricle: The cavity size was normal. Wall thickness was increased in a pattern of mild LVH. Systolic function was normal.   The estimated ejection fraction was in the range of 60% to 65%.   Wall motion was normal; there were no regional wall motion   abnormalities. Doppler parameters are consistent with restrictive   physiology, indicative of decreased left ventricular diastolic   compliance and/or increased left atrial pressure. Doppler   parameters are consistent with high ventricular filling pressure. - Aortic valve: Mildly calcified annulus. Trileaflet. There was   mild regurgitation. - Mitral valve: Moderately calcified annulus. Normal thickness   leaflets . There was mild regurgitation. - Tricuspid valve: There was mild regurgitation. - Systemic veins: IVC poorly visualized but appears dilated.  Assessment and Plan: 1. Persistent atrial fibrillation S/p ablation 05/20/18 with repeat ablation 09/02/19. Patient appeared to be maintaining SR until last Thursday  Continue amiodarone 200 mg daily  Continue Eliquis 5 mg BID Missed one dose eliquis  7/19, eligible for DCCV after 8/10. Scheduled  for 8/11.  Continue  Lopressor to 12.5 mg BID, can take an extra 1/2 tab if heart races  Cbc/bmet today  Has had both vaccines covid test   This patients CHA2DS2-VASc Score and unadjusted  Ischemic Stroke Rate (% per year) is equal to 3.2 % stroke rate/year from a score of 3  Above score calculated as 1 point each if present [CHF, HTN, DM, Vascular=MI/PAD/Aortic Plaque, Age if 65-74, or Male] Above score calculated as 2 points each if present [Age > 75, or Stroke/TIA/TE]  2. CAD S/p DES to RCA and LCx.  No anginal symptoms  3. HTN  Stable  Continue   Losartan  50 mg daily  Bmet in 2 weeks.  4. OSA Encouraged compliance with CPAP therapy.  5. Obesity Body mass index is 38.83 kg/m. Lifestyle modification was discussed and encouraged including regular physical activity and weight reduction.     Follow up with Dr Elberta Fortis  8/17   Lupita Leash C. [facility] [address] [place], [place] [address] [phone]

## 2019-11-21 NOTE — Patient Instructions (Signed)
Cardioversion scheduled for Wednesday, August 11th  - Arrive at the [facility] and go to admitting at [facility] not eat or drink anything after midnight the night prior to your procedure.  - Take all your morning medication (except diabetic medications) with a sip of water prior to arrival.  - You will not be able to drive home after your procedure.  - Do NOT miss any doses of your blood thinner - if you should miss a dose please notify our office immediately.

## 2019-11-21 NOTE — Progress Notes (Signed)
Primary Care Physician: Renford Dills, MD Referring Physician: Dr. Elberta Fortis Cardiologist: Dr. Madolyn Frieze Ronald Hull is a 74 y.o. male with a h/o persistent afib, CAD, that is in the afib clinic for f/u.  He presented to his primary physician on 10/02/15 with an elevated and irregular heart rate. Cardioversion was attempted and was unsuccessful with repeat cardioversion successfully to 360 J. He was initially placed on amiodarone and has since been switched to Multaq. He went back into atrial fibrillation and had cardioversion 10/12/17.  He was scheduled for ablation, but on his pre-ablation CT scan was found to have coronary artery disease.  He was sent to catheterization and had a drug-eluting stent to his circumflex and RCA on 02/24/2018.  He had A. fib ablation January 2020 and has been started on amiodarone. Patient underwent successful DCCV on 09/22/18. He is on Eliquis for a CHADS2VASC score of 3.   Patient had recurrent afib and underwent DCCV on 07/15/19 but did not convert to SR. He underwent repeat ablation with Dr Elberta Fortis on 09/02/19. He denies any symptoms of afib since the procedure.   He is now back in the clinic, 7/26 as he developed afib late last week. He took extra BB over the weekend but remains in rate controlled afib today. He did miss one does of DOAC 7/19. He was back on both doses on 7/20 and has not missed any more. He recently broke his left wrist and is questioning if stress around this may have been his trigger.   Today, he denies symptoms of palpitations, chest pain, orthopnea, PND, lower extremity edema, dizziness, presyncope, syncope, or neurologic sequela. The patient is tolerating medications without difficulties and is otherwise without complaint today.   Past Medical History:  Diagnosis Date  . Arthritis   . Bronchitis 05/23/2018  . CAD (coronary artery disease) 02/24/2018   LHC 10/19: pLAD 50, mLAD 50 (dFFR=0.89), oLCx 95, pRCA 30, mRCA 80 >> PCI: DES to LCx;  DES to RCA // [id number] Trial participant (will DC Plavix after 30 d and remain on ASA + Apixaban)  . Chest pain secondary to ablation, with expected mild elevation in troponin 05/22/2018  . Complication of anesthesia    BP dropped post colonoscopy  . Hypercholesteremia   . Hypertension   . Lung nodules 02/25/2019   Chest CT 01/2019: LUL nodule unchanged (benign); new LUL nodules 6 mm, aortic atherosclerosis, emphysema, partially calcified pleural plaques suggesting prior empyema or hemothorax  . Persistent atrial fibrillation (HCC)   . Sleep apnea    does use a cpap  . Wears glasses    Past Surgical History:  Procedure Laterality Date  . ATRIAL FIBRILLATION ABLATION N/A 05/20/2018   Procedure: ATRIAL FIBRILLATION ABLATION;  Surgeon: Regan Lemming, MD;  Location: [facility] INVASIVE CV LAB;  Service: Cardiovascular;  Laterality: N/A;  . ATRIAL FIBRILLATION ABLATION N/A 09/02/2019   Procedure: ATRIAL FIBRILLATION ABLATION;  Surgeon: Regan Lemming, MD;  Location: [facility] INVASIVE CV LAB;  Service: Cardiovascular;  Laterality: N/A;  . CARDIOVERSION N/A 11/01/2015   Procedure: CARDIOVERSION;  Surgeon: [name], MD;  Location: [facility] ENDOSCOPY;  Service: Cardiovascular;  Laterality: N/A;  . CARDIOVERSION N/A 11/23/2015   Procedure: CARDIOVERSION;  Surgeon: Will [name], MD;  Location: [facility] ENDOSCOPY;  Service: Cardiovascular;  Laterality: N/A;  . CARDIOVERSION N/A 10/12/2017   Procedure: CARDIOVERSION;  Surgeon: [name], MD;  Location: [facility] ENDOSCOPY;  Service: Cardiovascular;  Laterality: N/A;  . CARDIOVERSION N/A 06/30/2018  Procedure: CARDIOVERSION;  Surgeon: Jake Bathe, MD;  Location: [facility] ENDOSCOPY;  Service: Cardiovascular;  Laterality: N/A;  . CARDIOVERSION N/A 09/22/2018   Procedure: CARDIOVERSION;  Surgeon: Quintella Reichert, MD;  Location: [facility] ENDOSCOPY;  Service: Cardiovascular;  Laterality: N/A;  . CARDIOVERSION N/A 07/15/2019   Procedure: CARDIOVERSION;  Surgeon: Jodelle Red, MD;  Location: [facility] ENDOSCOPY;  Service: Cardiovascular;  Laterality: N/A;  . COLONOSCOPY    . CORONARY STENT INTERVENTION N/A 02/24/2018   Procedure: CORONARY STENT INTERVENTION;  Surgeon: Tonny Bollman, MD;  Location: [facility] INVASIVE CV LAB;  Service: Cardiovascular;  Laterality: N/A;  . I & D EXTREMITY Left 10/19/2019   Procedure: IRRIGATION AND DEBRIDEMENT LEFT SMALL PROXIMAL PHALANX;  Surgeon: Bradly Bienenstock, MD;  Location: [facility];  Service: Orthopedics;  Laterality: Left;  . INTRAVASCULAR PRESSURE WIRE/FFR STUDY N/A 02/24/2018   Procedure: INTRAVASCULAR PRESSURE WIRE/FFR STUDY;  Surgeon: Tonny Bollman, MD;  Location: [facility] INVASIVE CV LAB;  Service: Cardiovascular;  Laterality: N/A;  . KNEE ARTHROSCOPY  2000  . LEFT HEART CATH AND CORONARY ANGIOGRAPHY N/A 02/24/2018   Procedure: LEFT HEART CATH AND CORONARY ANGIOGRAPHY;  Surgeon: Tonny Bollman, MD;  Location: [facility] INVASIVE CV LAB;  Service: Cardiovascular;  Laterality: N/A;  . OPEN REDUCTION INTERNAL FIXATION (ORIF) PROXIMAL PHALANX Left 10/19/2019   Procedure: OPEN REDUCTION INTERNAL FIXATION (ORIF) PROXIMAL PHALANX WITH K-WIRES LEFT SMALL;  Surgeon: Bradly Bienenstock, MD;  Location: [facility];  Service: Orthopedics;  Laterality: Left;  . SHOULDER ARTHROSCOPY WITH ROTATOR CUFF REPAIR AND SUBACROMIAL DECOMPRESSION Right 07/15/2012   Procedure: RIGHT ARTHROSCOPY SHOULDER DECOMPRESSION  SUBACROMIAL PARTIAL ACROMIOPLASTY WITH CORACOACROMIAL RELEASE,  DISTAL CLAVICULECTOMY, resect biceps debride labrium WITH ROTATOR CUFF REPAIR;  Surgeon: [name], MD;  Location: [facility];  Service: Orthopedics;  Laterality: Right;    Current Outpatient Medications  Medication Sig Dispense Refill  . acetaminophen (TYLENOL) 325 MG tablet Take 325 mg by mouth as needed (for pain.).     Marland Kitchen amiodarone (PACERONE) 200 MG tablet Take 1 tablet (200 mg total) by mouth daily. 90 tablet 2  . ASPIRIN LOW DOSE 81 MG  chewable tablet Chew 81 mg by mouth every evening.   0  . atorvastatin (LIPITOR) 40 MG tablet TAKE 1 TABLET BY MOUTH DAILY (Patient taking differently: Take 20 mg by mouth 2 (two) times daily. ) 90 tablet 1  . ELIQUIS 5 MG TABS tablet TAKE 1 TABLET(5 MG) BY MOUTH TWICE DAILY 60 tablet 5  . furosemide (LASIX) 20 MG tablet Take 1-2 tablets (20-40 mg total) by mouth daily as needed. 30 tablet 1  . losartan (COZAAR) 50 MG tablet Take 1 tablet (50 mg total) by mouth daily. 90 tablet 2  . metoprolol tartrate (LOPRESSOR) 50 MG tablet metoprolol tartrate 50 mg tablet  TAKE 1/2 TABLET BY MOUTH TWICE DAILY    . nitroGLYCERIN (NITROSTAT) 0.4 MG SL tablet PLACE ONE TABLET UNDER TONGUE AS NEEDED FOR CHEST PAIN EVERY 5 MINUTES AS NEEDED (Patient taking differently: Place 0.4 mg under the tongue every 5 (five) minutes as needed for chest pain. ) 25 tablet 2  . zaleplon (SONATA) 5 MG capsule Take 5 mg by mouth at bedtime as needed for sleep.     No current facility-administered medications for this encounter.    No Known Allergies  Social History   Socioeconomic History  . Marital status: Married    Spouse name: Not on file  . Number of children: Not on file  . Years  of education: Not on file  . Highest education level: Not on file  Occupational History  . Not on file  Tobacco Use  . Smoking status: Former Smoker    Packs/day: 2.00    Years: 10.00    Pack years: 20.00    Types: Cigarettes    Quit date: 07/09/1973    Years since quitting: 46.4  . Smokeless tobacco: Never Used  Vaping Use  . Vaping Use: Never used  Substance and Sexual Activity  . Alcohol use: Yes    Alcohol/week: 2.0 standard drinks    Types: 2 Glasses of wine per week  . Drug use: No  . Sexual activity: Not Currently    Birth control/protection: Abstinence  Other Topics Concern  . Not on file  Social History Narrative  . Not on file   Social Determinants of Health   Financial Resource Strain:   . Difficulty of  Paying Living Expenses:   Food Insecurity:   . Worried About Programme researcher, broadcasting/film/video in the Last Year:   . Barista in the Last Year:   Transportation Needs:   . Freight forwarder (Medical):   Marland Kitchen Lack of Transportation (Non-Medical):   Physical Activity:   . Days of Exercise per Week:   . Minutes of Exercise per Session:   Stress:   . Feeling of Stress :   Social Connections:   . Frequency of Communication with Friends and Family:   . Frequency of Social Gatherings with Friends and Family:   . Attends Religious Services:   . Active Member of Clubs or Organizations:   . Attends Banker Meetings:   Marland Kitchen Marital Status:   Intimate Partner Violence:   . Fear of Current or Ex-Partner:   . Emotionally Abused:   Marland Kitchen Physically Abused:   . Sexually Abused:     Family History  Problem Relation Age of Onset  . Heart failure Father   . Other Mother        bad fall  . Clotting disorder Mother   . Cancer Sister   . Hypertension Sister   . Heart disease Brother     ROS- All systems are reviewed and negative except as per the HPI above  Physical Exam: Vitals:   11/21/19 1403  BP: (!) 142/78  Pulse: 74  Weight: (!) 126.3 kg  Height: 5\' 11"  (1.803 m)   Wt Readings from Last 3 Encounters:  11/21/19 (!) 126.3 kg  10/19/19 122.6 kg  09/30/19 125.9 kg    Labs: Lab Results  Component Value Date   NA 137 10/14/2019   K 4.7 10/14/2019   CL 108 10/14/2019   CO2 21 (L) 10/14/2019   GLUCOSE 82 10/14/2019   BUN 14 10/14/2019   CREATININE 1.18 10/14/2019   CALCIUM 8.5 (L) 10/14/2019   No results found for: INR Lab Results  Component Value Date   CHOL 96 (L) 04/09/2018   HDL 31 (L) 04/09/2018   LDLCALC 50 04/09/2018   TRIG 77 04/09/2018    GEN- The patient is well appearing obese male, alert and oriented x 3 today.   HEENT-head normocephalic, atraumatic, sclera clear, conjunctiva pink, hearing intact, trachea midline. Lungs- Clear to ausculation  bilaterally, normal work of breathing Heart- irregular rate and rhythm, no murmurs, rubs or gallops  GI- soft, NT, ND, + BS Extremities- no clubbing, cyanosis, or edema MS- no significant deformity or atrophy Skin- no rash or lesion Psych- euthymic mood, full affect  Neuro- strength and sensation are intact   EKG- afib at 874 bpm, qrs int 110 ms, qtc 430 ms   Echo 05/22/18 demonstrates - Procedure narrative: Transthoracic echocardiography. Image   quality was poor. The study was technically difficult, as a   result of poor acoustic windows. Intravenous contrast (Definity)   was administered. - Left ventricle: The cavity size was normal. Wall thickness was increased in a pattern of mild LVH. Systolic function was normal.   The estimated ejection fraction was in the range of 60% to 65%.   Wall motion was normal; there were no regional wall motion   abnormalities. Doppler parameters are consistent with restrictive   physiology, indicative of decreased left ventricular diastolic   compliance and/or increased left atrial pressure. Doppler   parameters are consistent with high ventricular filling pressure. - Aortic valve: Mildly calcified annulus. Trileaflet. There was   mild regurgitation. - Mitral valve: Moderately calcified annulus. Normal thickness   leaflets . There was mild regurgitation. - Tricuspid valve: There was mild regurgitation. - Systemic veins: IVC poorly visualized but appears dilated.  Assessment and Plan: 1. Persistent atrial fibrillation S/p ablation 05/20/18 with repeat ablation 09/02/19. Patient appeared to be maintaining SR until last Thursday  Continue amiodarone 200 mg daily  Continue Eliquis 5 mg BID Missed one dose eliquis  7/19, eligible for DCCV after 8/10. Scheduled  for 8/11.  Continue  Lopressor to 12.5 mg BID, can take an extra 1/2 tab if heart races  Cbc/bmet today  Has had both vaccines covid test   This patients CHA2DS2-VASc Score and unadjusted  Ischemic Stroke Rate (% per year) is equal to 3.2 % stroke rate/year from a score of 3  Above score calculated as 1 point each if present [CHF, HTN, DM, Vascular=MI/PAD/Aortic Plaque, Age if 65-74, or Male] Above score calculated as 2 points each if present [Age > 75, or Stroke/TIA/TE]  2. CAD S/p DES to RCA and LCx.  No anginal symptoms  3. HTN  Stable  Continue   Losartan  50 mg daily  Bmet in 2 weeks.  4. OSA Encouraged compliance with CPAP therapy.  5. Obesity Body mass index is 38.83 kg/m. Lifestyle modification was discussed and encouraged including regular physical activity and weight reduction.     Follow up with Dr Elberta Fortis  8/17   Lupita Leash C[facility] [address] [place], [place] [address] [phone]

## 2019-11-22 DIAGNOSIS — M79642 Pain in left hand: Secondary | ICD-10-CM | POA: Diagnosis not present

## 2019-11-22 HISTORY — DX: Pain in left hand: M79.642

## 2019-11-29 DIAGNOSIS — M79642 Pain in left hand: Secondary | ICD-10-CM | POA: Diagnosis not present

## 2019-12-05 ENCOUNTER — Other Ambulatory Visit (HOSPITAL_COMMUNITY)
Admission: RE | Admit: 2019-12-05 | Discharge: 2019-12-05 | Disposition: A | Discharge status: Home / Self Care | Payer: Medicare Other | Source: Ambulatory Visit | Attending: Cardiovascular Disease | Admitting: Cardiovascular Disease

## 2019-12-05 DIAGNOSIS — Z20822 Contact with and (suspected) exposure to covid-19: Secondary | ICD-10-CM | POA: Insufficient documentation

## 2019-12-05 DIAGNOSIS — Z01812 Encounter for preprocedural laboratory examination: Secondary | ICD-10-CM | POA: Diagnosis not present

## 2019-12-05 LAB — SARS CORONAVIRUS 2 (TAT 6-24 HRS): SARS Coronavirus 2: NEGATIVE

## 2019-12-07 ENCOUNTER — Encounter (HOSPITAL_COMMUNITY): Payer: Self-pay | Admitting: Cardiovascular Disease

## 2019-12-07 ENCOUNTER — Ambulatory Visit (HOSPITAL_COMMUNITY): Payer: Medicare Other | Admitting: Anesthesiology

## 2019-12-07 ENCOUNTER — Encounter (HOSPITAL_COMMUNITY)
Admission: RE | Disposition: A | Discharge status: Home / Self Care | Payer: Self-pay | Source: Home / Self Care | Attending: Cardiovascular Disease

## 2019-12-07 ENCOUNTER — Other Ambulatory Visit: Payer: Self-pay

## 2019-12-07 ENCOUNTER — Ambulatory Visit (HOSPITAL_COMMUNITY)
Admission: RE | Admit: 2019-12-07 | Discharge: 2019-12-07 | Disposition: A | Discharge status: Home / Self Care | Payer: Medicare Other | Attending: Cardiovascular Disease | Admitting: Cardiovascular Disease

## 2019-12-07 DIAGNOSIS — Z7982 Long term (current) use of aspirin: Secondary | ICD-10-CM | POA: Insufficient documentation

## 2019-12-07 DIAGNOSIS — E669 Obesity, unspecified: Secondary | ICD-10-CM | POA: Diagnosis not present

## 2019-12-07 DIAGNOSIS — G4733 Obstructive sleep apnea (adult) (pediatric): Secondary | ICD-10-CM | POA: Diagnosis not present

## 2019-12-07 DIAGNOSIS — E785 Hyperlipidemia, unspecified: Secondary | ICD-10-CM | POA: Diagnosis not present

## 2019-12-07 DIAGNOSIS — I10 Essential (primary) hypertension: Secondary | ICD-10-CM | POA: Diagnosis not present

## 2019-12-07 DIAGNOSIS — E7800 Pure hypercholesterolemia, unspecified: Secondary | ICD-10-CM | POA: Diagnosis not present

## 2019-12-07 DIAGNOSIS — Z7901 Long term (current) use of anticoagulants: Secondary | ICD-10-CM | POA: Diagnosis not present

## 2019-12-07 DIAGNOSIS — I4819 Other persistent atrial fibrillation: Secondary | ICD-10-CM | POA: Diagnosis not present

## 2019-12-07 DIAGNOSIS — Z79899 Other long term (current) drug therapy: Secondary | ICD-10-CM | POA: Diagnosis not present

## 2019-12-07 DIAGNOSIS — Z955 Presence of coronary angioplasty implant and graft: Secondary | ICD-10-CM | POA: Diagnosis not present

## 2019-12-07 DIAGNOSIS — I2510 Atherosclerotic heart disease of native coronary artery without angina pectoris: Secondary | ICD-10-CM | POA: Diagnosis not present

## 2019-12-07 DIAGNOSIS — Z87891 Personal history of nicotine dependence: Secondary | ICD-10-CM | POA: Diagnosis not present

## 2019-12-07 DIAGNOSIS — Z6838 Body mass index (BMI) 38.0-38.9, adult: Secondary | ICD-10-CM | POA: Diagnosis not present

## 2019-12-07 DIAGNOSIS — I480 Paroxysmal atrial fibrillation: Secondary | ICD-10-CM | POA: Diagnosis not present

## 2019-12-07 HISTORY — PX: CARDIOVERSION: SHX1299

## 2019-12-07 LAB — POCT I-STAT, CHEM 8
BUN: 16 mg/dL (ref 8–23)
Calcium, Ion: 1.2 mmol/L (ref 1.15–1.40)
Chloride: 103 mmol/L (ref 98–111)
Creatinine, Ser: 1.1 mg/dL (ref 0.61–1.24)
Glucose, Bld: 104 mg/dL — ABNORMAL HIGH (ref 70–99)
HCT: 46 % (ref 39.0–52.0)
Hemoglobin: 15.6 g/dL (ref 13.0–17.0)
Potassium: 4.8 mmol/L (ref 3.5–5.1)
Sodium: 140 mmol/L (ref 135–145)
TCO2: 25 mmol/L (ref 22–32)

## 2019-12-07 SURGERY — CARDIOVERSION
Anesthesia: General

## 2019-12-07 MED ORDER — PROPOFOL 10 MG/ML IV BOLUS
INTRAVENOUS | Status: DC | PRN
  Administered 2019-12-07: 20 mg via INTRAVENOUS
  Administered 2019-12-07 (×2): 30 mg via INTRAVENOUS
  Administered 2019-12-07: 60 mg via INTRAVENOUS

## 2019-12-07 MED ORDER — LIDOCAINE 2% (20 MG/ML) 5 ML SYRINGE
INTRAMUSCULAR | Status: DC | PRN
  Administered 2019-12-07: 60 mg via INTRAVENOUS

## 2019-12-07 MED ORDER — SODIUM CHLORIDE 0.9 % IV SOLN: INTRAVENOUS | Status: DC | PRN

## 2019-12-07 NOTE — CV Procedure (Signed)
Electrical Cardioversion Procedure Note Ronald Hull [id number] Jul 27, 1945  Procedure: Electrical Cardioversion Indications:  Atrial Fibrillation  Procedure Details Consent: Risks of procedure as well as the alternatives and risks of each were explained to the (patient/caregiver).  Consent for procedure obtained. Time Out: Verified patient identification, verified procedure, site/side was marked, verified correct patient position, special equipment/implants available, medications/allergies/relevent history reviewed, required imaging and test results available.  Performed  Patient placed on cardiac monitor, pulse oximetry, supplemental oxygen as necessary.  Sedation given: propofol Pacer pads placed anterior and posterior chest.  Cardioverted 3 time(s).  Cardioverted at 200J unsuccessful.  200J with pressure unsuccessful.  200J with pressure successful.  Evaluation Findings: Post procedure EKG shows: NSR Complications: None Patient did tolerate procedure well.   Chilton Si, MD 12/07/2019, 11:11 AM

## 2019-12-07 NOTE — Anesthesia Postprocedure Evaluation (Signed)
Anesthesia Post Note  Patient: Ronald Hull  Procedure(s) Performed: CARDIOVERSION (N/A )     Patient location during evaluation: Endoscopy Anesthesia Type: General Level of consciousness: awake and alert Pain management: pain level controlled Vital Signs Assessment: post-procedure vital signs reviewed and stable Respiratory status: spontaneous breathing, nonlabored ventilation, respiratory function stable and patient connected to nasal cannula oxygen Cardiovascular status: blood pressure returned to baseline and stable Postop Assessment: no apparent nausea or vomiting Anesthetic complications: no   No complications documented.  Last Vitals:  Vitals:   12/07/19 1137 12/07/19 1147  BP: 126/70 139/62  Pulse: (!) 50 (!) 46  Resp: 18 19  Temp:    SpO2: 98% 98%    Last Pain:  Vitals:   12/07/19 1147  TempSrc:   PainSc: 0-No pain                 Cecile Hearing

## 2019-12-07 NOTE — Discharge Instructions (Signed)
Electrical Cardioversion Electrical cardioversion is the delivery of a jolt of electricity to restore a normal rhythm to the heart. A rhythm that is too fast or is not regular keeps the heart from pumping well. In this procedure, sticky patches or metal paddles are placed on the chest to deliver electricity to the heart from a device.  Follow these instructions at home:  Do not drive for 24 hours if you were given a sedative during your procedure.  Take over-the-counter and prescription medicines only as told by your health care provider.  Ask your health care provider how to check your pulse. Check it often.  Rest for 48 hours after the procedure or as told by your health care provider.  Avoid or limit your caffeine use as told by your health care provider.  Keep all follow-up visits as told by your health care provider. This is important. Contact a health care provider if:  You feel like your heart is beating too quickly or your pulse is not regular.  You have a serious muscle cramp that does not go away. Get help right away if:  You have discomfort in your chest.  You are dizzy or you feel faint.  You have trouble breathing or you are short of breath.  Your speech is slurred.  You have trouble moving an arm or leg on one side of your body.  Your fingers or toes turn cold or blue. Summary  Electrical cardioversion is the delivery of a jolt of electricity to restore a normal rhythm to the heart.  This procedure may be done right away in an emergency or may be a scheduled procedure if the condition is not an emergency.  Generally, this is a safe procedure.  After the procedure, check your pulse often as told by your health care provider. This information is not intended to replace advice given to you by your health care provider. Make sure you discuss any questions you have with your health care provider. Document Revised: 11/15/2018 Document Reviewed: 11/15/2018 Elsevier  Patient Education  2020 Elsevier Inc.  

## 2019-12-07 NOTE — Transfer of Care (Signed)
Immediate Anesthesia Transfer of Care Note  Patient: Ronald Hull  Procedure(s) Performed: CARDIOVERSION (N/A )  Patient Location: Endoscopy Unit  Anesthesia Type:General  Level of Consciousness: drowsy  Airway & Oxygen Therapy: Patient Spontanous Breathing  Post-op Assessment: Report given to RN and Post -op Vital signs reviewed and stable  Post vital signs: Reviewed and stable  Last Vitals:  Vitals Value Taken Time  BP    Temp    Pulse    Resp    SpO2      Last Pain:  Vitals:   12/07/19 1015  TempSrc: Oral  PainSc: 0-No pain         Complications: No complications documented.

## 2019-12-07 NOTE — Interval H&P Note (Signed)
History and Physical Interval Note:  12/07/2019 10:09 AM  Ronald Hull  has presented today for surgery, with the diagnosis of AFIB.  The various methods of treatment have been discussed with the patient and family. After consideration of risks, benefits and other options for treatment, the patient has consented to  Procedure(s): CARDIOVERSION (N/A) as a surgical intervention.  The patient's history has been reviewed, patient examined, no change in status, stable for surgery.  I have reviewed the patient's chart and labs.  Questions were answered to the patient's satisfaction.     Chilton Si, MD

## 2019-12-07 NOTE — Anesthesia Preprocedure Evaluation (Addendum)
Anesthesia Evaluation  Patient identified by MRN, date of birth, ID band Patient awake    Reviewed: Allergy & Precautions, H&P , NPO status , Patient's Chart, lab work & pertinent test results, reviewed documented beta blocker date and time   Airway Mallampati: II  TM Distance: >3 FB Neck ROM: full    Dental  (+) Teeth Intact, Dental Advisory Given   Pulmonary sleep apnea and Continuous Positive Airway Pressure Ventilation , former smoker,    Pulmonary exam normal breath sounds clear to auscultation       Cardiovascular hypertension, Pt. on home beta blockers and Pt. on medications + CAD and + Cardiac Stents  + dysrhythmias Atrial Fibrillation  Rhythm:Irregular Rate:Abnormal     Neuro/Psych negative neurological ROS  negative psych ROS   GI/Hepatic negative GI ROS, Neg liver ROS,   Endo/Other  Obesity   Renal/GU negative Renal ROS     Musculoskeletal  (+) Arthritis ,   Abdominal   Peds  Hematology  (+) Blood dyscrasia (Eliquis), ,   Anesthesia Other Findings   Reproductive/Obstetrics                            Anesthesia Physical Anesthesia Plan  ASA: III  Anesthesia Plan: General   Post-op Pain Management:    Induction: Intravenous  PONV Risk Score and Plan: 2 and Propofol infusion and Treatment may vary due to age or medical condition  Airway Management Planned: Mask  Additional Equipment:   Intra-op Plan:   Post-operative Plan:   Informed Consent: I have reviewed the patients History and Physical, chart, labs and discussed the procedure including the risks, benefits and alternatives for the proposed anesthesia with the patient or authorized representative who has indicated his/her understanding and acceptance.     Dental advisory given  Plan Discussed with: Anesthesiologist, CRNA and Surgeon  Anesthesia Plan Comments:        Anesthesia Quick Evaluation

## 2019-12-08 ENCOUNTER — Encounter (HOSPITAL_COMMUNITY): Payer: Self-pay | Admitting: Cardiovascular Disease

## 2019-12-08 DIAGNOSIS — S62617D Displaced fracture of proximal phalanx of left little finger, subsequent encounter for fracture with routine healing: Secondary | ICD-10-CM | POA: Diagnosis not present

## 2019-12-08 DIAGNOSIS — Z4789 Encounter for other orthopedic aftercare: Secondary | ICD-10-CM | POA: Diagnosis not present

## 2019-12-08 DIAGNOSIS — M79642 Pain in left hand: Secondary | ICD-10-CM | POA: Diagnosis not present

## 2019-12-11 ENCOUNTER — Other Ambulatory Visit: Payer: Self-pay | Admitting: Cardiology

## 2019-12-12 NOTE — Telephone Encounter (Signed)
Pt's age 74, wt 122.5 kg, SCr 1.18, CrCl 95.16, last ov w/ afib clinic 11/21/19.

## 2019-12-13 ENCOUNTER — Ambulatory Visit: Payer: Medicare Other | Admitting: Cardiology

## 2019-12-13 ENCOUNTER — Encounter: Payer: Self-pay | Admitting: Cardiology

## 2019-12-13 ENCOUNTER — Other Ambulatory Visit: Payer: Self-pay

## 2019-12-13 VITALS — BP 142/78 | HR 49 | Ht 71.0 in | Wt 279.8 lb

## 2019-12-13 DIAGNOSIS — I4819 Other persistent atrial fibrillation: Secondary | ICD-10-CM

## 2019-12-13 MED ORDER — APIXABAN 5 MG PO TABS: ORAL_TABLET | ORAL | 5 refills | Status: DC

## 2019-12-13 NOTE — Patient Instructions (Signed)
Medication Instructions:  Your physician recommends that you continue on your current medications as directed. Please refer to the Current Medication list given to you today.  *If you need a refill on your cardiac medications before your next appointment, please call your pharmacy*   Lab Work: None ordered If you have labs (blood work) drawn today and your tests are completely normal, you will receive your results only by: . MyChart Message (if you have MyChart) OR . A paper copy in the mail If you have any lab test that is abnormal or we need to change your treatment, we will call you to review the results.   Testing/Procedures: None ordered   Follow-Up: At [facility], you and your health needs are our priority.  As part of our continuing mission to provide you with exceptional heart care, we have created designated Provider Care Teams.  These Care Teams include your primary Cardiologist (physician) and Advanced Practice Providers (APPs -  Physician Assistants and Nurse Practitioners) who all work together to provide you with the care you need, when you need it.  We recommend signing up for the patient portal called "MyChart".  Sign up information is provided on this After Visit Summary.  MyChart is used to connect with patients for Virtual Visits (Telemedicine).  Patients are able to view lab/test results, encounter notes, upcoming appointments, etc.  Non-urgent messages can be sent to your provider as well.   To learn more about what you can do with MyChart, go to [email or web address].    Your next appointment:   3 month(s)  The format for your next appointment:   In Person  Provider:   Will Camnitz, MD   Thank you for choosing [facility]!!   Jenilee Franey, RN [phone]    Other Instructions    

## 2019-12-13 NOTE — Progress Notes (Signed)
Electrophysiology Office Note   Date:  12/13/2019   ID:  Ronald Hull, DOB 1945-12-27, MRN [id number]  PCP:  Renford Dills, MD   Primary Electrophysiologist:  Regan Lemming, MD    No chief complaint on file.    History of Present Illness: Ronald Hull is a 74 y.o. male who presents today for electrophysiology evaluation.   He presented to his primary physician on 10/02/15 with an elevated and irregular heart rate. Cardioversion was attempted and was unsuccessful with repeat cardioversion successfully to 360 J. He was initially placed on amiodarone and has since been switched to Multaq. He went back into atrial fibrillation and had cardioversion 10/12/17.  He was scheduled for ablation, but on his pre-ablation CT scan was found to have coronary artery disease.  He was sent to catheterization and had a drug-eluting stent to his circumflex and RCA on 02/24/2018.  He had A. fib ablation January 2020.  He unfortunately went back into atrial fibrillation 2 times.  He has since been put on amiodarone and has done well.  He is now status post repeat ablation 09/02/2019.  Since his ablation, he has had 1 more set of atrial fibrillation and is now status post cardioversion 12/07/2019.  Today, denies symptoms of palpitations, chest pain, shortness of breath, orthopnea, PND, lower extremity edema, claudication, dizziness, presyncope, syncope, bleeding, or neurologic sequela. The patient is tolerating medications without difficulties.  Since his cardioversion he is done well.  He has no chest pain or shortness of breath.  He is able do all his daily activities without restriction.  Past Medical History:  Diagnosis Date  . Arthritis   . Bronchitis 05/23/2018  . CAD (coronary artery disease) 02/24/2018   LHC 10/19: pLAD 50, mLAD 50 (dFFR=0.89), oLCx 95, pRCA 30, mRCA 80 >> PCI: DES to LCx; DES to RCA // [id number] Trial participant (Senon Nixon DC Plavix after 30 d and remain on ASA + Apixaban)  . Chest  pain secondary to ablation, with expected mild elevation in troponin 05/22/2018  . Complication of anesthesia    BP dropped post colonoscopy  . Hypercholesteremia   . Hypertension   . Lung nodules 02/25/2019   Chest CT 01/2019: LUL nodule unchanged (benign); new LUL nodules 6 mm, aortic atherosclerosis, emphysema, partially calcified pleural plaques suggesting prior empyema or hemothorax  . Persistent atrial fibrillation (HCC)   . Sleep apnea    does use a cpap  . Wears glasses    Past Surgical History:  Procedure Laterality Date  . ATRIAL FIBRILLATION ABLATION N/A 05/20/2018   Procedure: ATRIAL FIBRILLATION ABLATION;  Surgeon: Regan Lemming, MD;  Location: [facility] INVASIVE CV [facility];  Service: Cardiovascular;  Laterality: N/A;  . ATRIAL FIBRILLATION ABLATION N/A 09/02/2019   Procedure: ATRIAL FIBRILLATION ABLATION;  Surgeon: Regan Lemming, MD;  Location: [facility] INVASIVE CV [facility];  Service: Cardiovascular;  Laterality: N/A;  . CARDIOVERSION N/A 11/01/2015   Procedure: CARDIOVERSION;  Surgeon: [name], MD;  Location: [facility] ENDOSCOPY;  Service: Cardiovascular;  Laterality: N/A;  . CARDIOVERSION N/A 11/23/2015   Procedure: CARDIOVERSION;  Surgeon: Braxden Lovering [name], MD;  Location: [facility] ENDOSCOPY;  Service: Cardiovascular;  Laterality: N/A;  . CARDIOVERSION N/A 10/12/2017   Procedure: CARDIOVERSION;  Surgeon: [name], MD;  Location: [facility] ENDOSCOPY;  Service: Cardiovascular;  Laterality: N/A;  . CARDIOVERSION N/A 06/30/2018   Procedure: CARDIOVERSION;  Surgeon: Jake Bathe, MD;  Location: [facility] ENDOSCOPY;  Service: Cardiovascular;  Laterality: N/A;  . CARDIOVERSION N/A 09/22/2018  Procedure: CARDIOVERSION;  Surgeon: Quintella Reichert, MD;  Location: [facility] ENDOSCOPY;  Service: Cardiovascular;  Laterality: N/A;  . CARDIOVERSION N/A 07/15/2019   Procedure: CARDIOVERSION;  Surgeon: Jodelle Red, MD;  Location: [facility] ENDOSCOPY;  Service: Cardiovascular;  Laterality: N/A;  . CARDIOVERSION N/A  12/07/2019   Procedure: CARDIOVERSION;  Surgeon: Chilton Si, MD;  Location: [facility] ENDOSCOPY;  Service: Cardiovascular;  Laterality: N/A;  . COLONOSCOPY    . CORONARY STENT INTERVENTION N/A 02/24/2018   Procedure: CORONARY STENT INTERVENTION;  Surgeon: Tonny Bollman, MD;  Location: [facility] INVASIVE CV LAB;  Service: Cardiovascular;  Laterality: N/A;  . I & D EXTREMITY Left 10/19/2019   Procedure: IRRIGATION AND DEBRIDEMENT LEFT SMALL PROXIMAL PHALANX;  Surgeon: Bradly Bienenstock, MD;  Location: [facility];  Service: Orthopedics;  Laterality: Left;  . INTRAVASCULAR PRESSURE WIRE/FFR STUDY N/A 02/24/2018   Procedure: INTRAVASCULAR PRESSURE WIRE/FFR STUDY;  Surgeon: Tonny Bollman, MD;  Location: [facility] INVASIVE CV LAB;  Service: Cardiovascular;  Laterality: N/A;  . KNEE ARTHROSCOPY  2000  . LEFT HEART CATH AND CORONARY ANGIOGRAPHY N/A 02/24/2018   Procedure: LEFT HEART CATH AND CORONARY ANGIOGRAPHY;  Surgeon: Tonny Bollman, MD;  Location: [facility] INVASIVE CV LAB;  Service: Cardiovascular;  Laterality: N/A;  . OPEN REDUCTION INTERNAL FIXATION (ORIF) PROXIMAL PHALANX Left 10/19/2019   Procedure: OPEN REDUCTION INTERNAL FIXATION (ORIF) PROXIMAL PHALANX WITH K-WIRES LEFT SMALL;  Surgeon: Bradly Bienenstock, MD;  Location: [facility];  Service: Orthopedics;  Laterality: Left;  . SHOULDER ARTHROSCOPY WITH ROTATOR CUFF REPAIR AND SUBACROMIAL DECOMPRESSION Right 07/15/2012   Procedure: RIGHT ARTHROSCOPY SHOULDER DECOMPRESSION  SUBACROMIAL PARTIAL ACROMIOPLASTY WITH CORACOACROMIAL RELEASE,  DISTAL CLAVICULECTOMY, resect biceps debride labrium WITH ROTATOR CUFF REPAIR;  Surgeon: [name], MD;  Location:  SURGERY CENTER;  Service: Orthopedics;  Laterality: Right;     Current Outpatient Medications  Medication Sig Dispense Refill  . acetaminophen (TYLENOL) 325 MG tablet Take 325 mg by mouth every 6 (six) hours as needed (for pain.).     Marland Kitchen amiodarone (PACERONE) 200 MG tablet Take 1  tablet (200 mg total) by mouth daily. 90 tablet 2  . apixaban (ELIQUIS) 5 MG TABS tablet TAKE 1 TABLET(5 MG) BY MOUTH TWICE DAILY 60 tablet 5  . ASPIRIN LOW DOSE 81 MG chewable tablet Chew 81 mg by mouth every evening.   0  . atorvastatin (LIPITOR) 40 MG tablet TAKE 1 TABLET BY MOUTH DAILY 90 tablet 1  . furosemide (LASIX) 20 MG tablet Take 1-2 tablets (20-40 mg total) by mouth daily as needed. 30 tablet 1  . losartan (COZAAR) 50 MG tablet Take 25 mg by mouth 2 (two) times daily.    . metoprolol tartrate (LOPRESSOR) 50 MG tablet Take 25 mg by mouth 2 (two) times daily.     . nitroGLYCERIN (NITROSTAT) 0.4 MG SL tablet PLACE ONE TABLET UNDER TONGUE AS NEEDED FOR CHEST PAIN EVERY 5 MINUTES AS NEEDED 25 tablet 2  . zaleplon (SONATA) 5 MG capsule Take 5 mg by mouth at bedtime as needed for sleep.     No current facility-administered medications for this visit.    Allergies:   Patient has no known allergies.   Social History:  The patient  reports that he quit smoking about 46 years ago. His smoking use included cigarettes. He has a 20.00 pack-year smoking history. He has never used smokeless tobacco. He reports current alcohol use of about 2.0 standard drinks of alcohol per week. He reports that he does not use drugs.  Family History:  The patient's family history includes Cancer in his sister; Clotting disorder in his mother; Heart disease in his brother; Heart failure in his father; Hypertension in his sister; Other in his mother.    ROS:  Please see the history of present illness.   Otherwise, review of systems is positive for none.   All other systems are reviewed and negative.   PHYSICAL EXAM: VS:  BP (!) 142/78   Pulse (!) 49   Ht 5\' 11"  (1.803 m)   Wt 279 lb 12.8 oz (126.9 kg)   SpO2 97%   BMI 39.02 kg/m  , BMI Body mass index is 39.02 kg/m. GEN: Well nourished, well developed, in no acute distress  HEENT: normal  Neck: no JVD, carotid bruits, or masses Cardiac: RRR; no murmurs,  rubs, or gallops,no edema  Respiratory:  clear to auscultation bilaterally, normal work of breathing GI: soft, nontender, nondistended, + BS MS: no deformity or atrophy  Skin: warm and dry Neuro:  Strength and sensation are intact Psych: euthymic mood, full affect  EKG:  EKG is ordered today. Personal review of the ekg ordered shows sinus rhythm, rate 49  Recent Labs: 09/30/2019: ALT 34; TSH 2.757 11/21/2019: Platelets 193 12/07/2019: BUN 16; Creatinine, Ser 1.10; Hemoglobin 15.6; Potassium 4.8; Sodium 140    Lipid Panel     Component Value Date/Time   CHOL 96 (L) 04/09/2018 0959   TRIG 77 04/09/2018 0959   HDL 31 (L) 04/09/2018 0959   CHOLHDL 3.1 04/09/2018 0959   LDLCALC 50 04/09/2018 0959     Wt Readings from Last 3 Encounters:  12/13/19 279 lb 12.8 oz (126.9 kg)  12/07/19 270 lb (122.5 kg)  11/21/19 (!) 278 lb 6.4 oz (126.3 kg)      Other studies Reviewed: Additional studies/ records that were reviewed today include: TTE 09/2015 - Left ventricle: The cavity size was normal. Wall thickness was   increased in a pattern of moderate LVH. Systolic function was   normal. The estimated ejection fraction was in the range of 55%   to 60%. Indeterminant diastolic function. Wall motion was normal;   there were no regional wall motion abnormalities. - Aortic valve: There was no stenosis. There was trivial   regurgitation. - Mitral valve: Mildly calcified annulus. There was mild   regurgitation. - Left atrium: The atrium was mildly dilated. - Right ventricle: The cavity size was normal. Systolic function   was normal. - Right atrium: The atrium was mildly dilated. - Tricuspid valve: Peak RV-RA gradient (S): 26 mm Hg. - Pulmonary arteries: PA peak pressure: 29 mm Hg (S). - Inferior vena cava: The vessel was normal in size. The   respirophasic diameter changes were in the normal range (= 50%),   consistent with normal central venous pressure.  LHC 02/24/18  Mid RCA lesion is  80% stenosed.  Prox RCA lesion is 30% stenosed.  Ost Cx to Prox Cx lesion is 95% stenosed.  Prox LAD lesion is 50% stenosed.  Prox LAD to Mid LAD lesion is 50% stenosed.  A drug-eluting stent was successfully placed using a STENT SIERRA 3.50 X 15 MM.  Post intervention, there is a 0% residual stenosis.  A drug-eluting stent was successfully placed using a STENT SIERRA 4.00 X 15 MM.  Post intervention, there is a 0% residual stenosis.   ASSESSMENT AND PLAN:  1.  Persistent atrial fibrillation: Currently on Eliquis.  He has failed Multaq.  He is status post AF ablation 05/20/2018  with repeat ablation 09/02/2019.  Currently on amiodarone and Eliquis with a CHA2DS2-VASc of 2.  He had a recent cardioversion, and remains in sinus rhythm.  At this point, we Kolter Reaver continue with amiodarone at the current dose.   2.  Hypertension: Mildly elevated today but usually well controlled  3.  Obstructive sleep apnea: CPAP compliance encouraged  4.  Morbid obesity: Diet and exercise encouraged  5.  Currently on aspirin and Eliquis.  Status post circumflex and RCA stents.  Current medicines are reviewed at length with the patient today.   The patient does not have concerns regarding his medicines.  The following changes were made today: None  Labs/ tests ordered today include:  Orders Placed This Encounter  Procedures  . EKG 12-Lead    Disposition:   FU with Judyann Casasola 3 months  Signed, Makinzee Durley [name], MD  12/13/2019 9:06 AM     [facility] [address] [place] [address] [phone] (office) [phone] (fax)

## 2019-12-17 ENCOUNTER — Other Ambulatory Visit: Payer: Self-pay | Admitting: Cardiology

## 2019-12-20 ENCOUNTER — Ambulatory Visit (HOSPITAL_COMMUNITY)
Admission: RE | Admit: 2019-12-20 | Discharge: 2019-12-20 | Disposition: A | Discharge status: Home / Self Care | Payer: Medicare Other | Source: Ambulatory Visit | Attending: Physician Assistant | Admitting: Physician Assistant

## 2019-12-20 ENCOUNTER — Other Ambulatory Visit: Payer: Self-pay

## 2019-12-20 ENCOUNTER — Encounter (HOSPITAL_COMMUNITY): Payer: Self-pay | Admitting: Physician Assistant

## 2019-12-20 VITALS — BP 118/68 | HR 47 | Ht 71.0 in | Wt 273.0 lb

## 2019-12-20 DIAGNOSIS — E7800 Pure hypercholesterolemia, unspecified: Secondary | ICD-10-CM | POA: Diagnosis not present

## 2019-12-20 DIAGNOSIS — Z955 Presence of coronary angioplasty implant and graft: Secondary | ICD-10-CM | POA: Insufficient documentation

## 2019-12-20 DIAGNOSIS — Z79899 Other long term (current) drug therapy: Secondary | ICD-10-CM | POA: Diagnosis not present

## 2019-12-20 DIAGNOSIS — Z7901 Long term (current) use of anticoagulants: Secondary | ICD-10-CM | POA: Diagnosis not present

## 2019-12-20 DIAGNOSIS — E669 Obesity, unspecified: Secondary | ICD-10-CM | POA: Diagnosis not present

## 2019-12-20 DIAGNOSIS — D6869 Other thrombophilia: Secondary | ICD-10-CM

## 2019-12-20 DIAGNOSIS — I2510 Atherosclerotic heart disease of native coronary artery without angina pectoris: Secondary | ICD-10-CM | POA: Diagnosis not present

## 2019-12-20 DIAGNOSIS — I10 Essential (primary) hypertension: Secondary | ICD-10-CM | POA: Insufficient documentation

## 2019-12-20 DIAGNOSIS — I4819 Other persistent atrial fibrillation: Secondary | ICD-10-CM | POA: Insufficient documentation

## 2019-12-20 DIAGNOSIS — Z7982 Long term (current) use of aspirin: Secondary | ICD-10-CM | POA: Diagnosis not present

## 2019-12-20 DIAGNOSIS — Z6838 Body mass index (BMI) 38.0-38.9, adult: Secondary | ICD-10-CM | POA: Insufficient documentation

## 2019-12-20 DIAGNOSIS — Z87891 Personal history of nicotine dependence: Secondary | ICD-10-CM | POA: Insufficient documentation

## 2019-12-20 DIAGNOSIS — G4733 Obstructive sleep apnea (adult) (pediatric): Secondary | ICD-10-CM | POA: Insufficient documentation

## 2019-12-20 NOTE — Progress Notes (Signed)
Primary Care Physician: Renford Dills, MD Referring Physician: Dr. Elberta Fortis Cardiologist: Dr. Madolyn Frieze Ronald Hull is a 74 y.o. male with a h/o persistent afib, CAD, that is in the afib clinic for f/u.  He presented to his primary physician on 10/02/15 with an elevated and irregular heart rate. Cardioversion was attempted and was unsuccessful with repeat cardioversion successfully to 360 J. He was initially placed on amiodarone and has since been switched to Multaq. He went back into atrial fibrillation and had cardioversion 10/12/17.  He was scheduled for ablation, but on his pre-ablation CT scan was found to have coronary artery disease.  He was sent to catheterization and had a drug-eluting stent to his circumflex and RCA on 02/24/2018.  He had A. fib ablation January 2020 and has been started on amiodarone. Patient underwent successful DCCV on 09/22/18. He is on Eliquis for a CHADS2VASC score of 3.   Patient had recurrent afib and underwent DCCV on 07/15/19 but did not convert to SR. He underwent repeat ablation with Dr Elberta Fortis on 09/02/19. Patient was seen on 11/21/19 with rate controlled afib and underwent DCCV on 12/07/19.   On follow up today, he woke this morning in afib with heart rates ~100 bpm. He took a 1/2 dose of BB and converted back to SR in about 5 hours. He was very happy he converted back to SR on his own as he has always required DCCV in the past.   Today, he denies symptoms of palpitations, chest pain, orthopnea, PND, lower extremity edema, dizziness, presyncope, syncope, or neurologic sequela. The patient is tolerating medications without difficulties and is otherwise without complaint today.   Past Medical History:  Diagnosis Date  . Arthritis   . Bronchitis 05/23/2018  . CAD (coronary artery disease) 02/24/2018   LHC 10/19: pLAD 50, mLAD 50 (dFFR=0.89), oLCx 95, pRCA 30, mRCA 80 >> PCI: DES to LCx; DES to RCA // [id number] Trial participant (will DC Plavix after 30 d and  remain on ASA + Apixaban)  . Chest pain secondary to ablation, with expected mild elevation in troponin 05/22/2018  . Complication of anesthesia    BP dropped post colonoscopy  . Hypercholesteremia   . Hypertension   . Lung nodules 02/25/2019   Chest CT 01/2019: LUL nodule unchanged (benign); new LUL nodules 6 mm, aortic atherosclerosis, emphysema, partially calcified pleural plaques suggesting prior empyema or hemothorax  . Persistent atrial fibrillation (HCC)   . Sleep apnea    does use a cpap  . Wears glasses    Past Surgical History:  Procedure Laterality Date  . ATRIAL FIBRILLATION ABLATION N/A 05/20/2018   Procedure: ATRIAL FIBRILLATION ABLATION;  Surgeon: Regan Lemming, MD;  Location: [facility];  Service: Cardiovascular;  Laterality: N/A;  . ATRIAL FIBRILLATION ABLATION N/A 09/02/2019   Procedure: ATRIAL FIBRILLATION ABLATION;  Surgeon: Regan Lemming, MD;  Location: [facility];  Service: Cardiovascular;  Laterality: N/A;  . CARDIOVERSION N/A 11/01/2015   Procedure: CARDIOVERSION;  Surgeon: [name], MD;  Location: [facility] ENDOSCOPY;  Service: Cardiovascular;  Laterality: N/A;  . CARDIOVERSION N/A 11/23/2015   Procedure: CARDIOVERSION;  Surgeon: Will [name], MD;  Location: [facility] ENDOSCOPY;  Service: Cardiovascular;  Laterality: N/A;  . CARDIOVERSION N/A 10/12/2017   Procedure: CARDIOVERSION;  Surgeon: [name], MD;  Location: [facility] ENDOSCOPY;  Service: Cardiovascular;  Laterality: N/A;  . CARDIOVERSION N/A 06/30/2018   Procedure: CARDIOVERSION;  Surgeon: Jake Bathe, MD;  Location:  [facility] ENDOSCOPY;  Service: Cardiovascular;  Laterality: N/A;  . CARDIOVERSION N/A 09/22/2018   Procedure: CARDIOVERSION;  Surgeon: Quintella Reichert, MD;  Location: [facility] ENDOSCOPY;  Service: Cardiovascular;  Laterality: N/A;  . CARDIOVERSION N/A 07/15/2019   Procedure: CARDIOVERSION;  Surgeon: Jodelle Red, MD;  Location: [facility] ENDOSCOPY;  Service: Cardiovascular;   Laterality: N/A;  . CARDIOVERSION N/A 12/07/2019   Procedure: CARDIOVERSION;  Surgeon: Chilton Si, MD;  Location: [facility] ENDOSCOPY;  Service: Cardiovascular;  Laterality: N/A;  . COLONOSCOPY    . CORONARY STENT INTERVENTION N/A 02/24/2018   Procedure: CORONARY STENT INTERVENTION;  Surgeon: Tonny Bollman, MD;  Location: [facility] INVASIVE CV LAB;  Service: Cardiovascular;  Laterality: N/A;  . I & D EXTREMITY Left 10/19/2019   Procedure: IRRIGATION AND DEBRIDEMENT LEFT SMALL PROXIMAL PHALANX;  Surgeon: Bradly Bienenstock, MD;  Location: [facility];  Service: Orthopedics;  Laterality: Left;  . INTRAVASCULAR PRESSURE WIRE/FFR STUDY N/A 02/24/2018   Procedure: INTRAVASCULAR PRESSURE WIRE/FFR STUDY;  Surgeon: Tonny Bollman, MD;  Location: [facility] INVASIVE CV LAB;  Service: Cardiovascular;  Laterality: N/A;  . KNEE ARTHROSCOPY  2000  . LEFT HEART CATH AND CORONARY ANGIOGRAPHY N/A 02/24/2018   Procedure: LEFT HEART CATH AND CORONARY ANGIOGRAPHY;  Surgeon: Tonny Bollman, MD;  Location: [facility] INVASIVE CV LAB;  Service: Cardiovascular;  Laterality: N/A;  . OPEN REDUCTION INTERNAL FIXATION (ORIF) PROXIMAL PHALANX Left 10/19/2019   Procedure: OPEN REDUCTION INTERNAL FIXATION (ORIF) PROXIMAL PHALANX WITH K-WIRES LEFT SMALL;  Surgeon: Bradly Bienenstock, MD;  Location: [facility];  Service: Orthopedics;  Laterality: Left;  . SHOULDER ARTHROSCOPY WITH ROTATOR CUFF REPAIR AND SUBACROMIAL DECOMPRESSION Right 07/15/2012   Procedure: RIGHT ARTHROSCOPY SHOULDER DECOMPRESSION  SUBACROMIAL PARTIAL ACROMIOPLASTY WITH CORACOACROMIAL RELEASE,  DISTAL CLAVICULECTOMY, resect biceps debride labrium WITH ROTATOR CUFF REPAIR;  Surgeon: [name], MD;  Location: [facility];  Service: Orthopedics;  Laterality: Right;    Current Outpatient Medications  Medication Sig Dispense Refill  . acetaminophen (TYLENOL) 325 MG tablet Take 325 mg by mouth every 6 (six) hours as needed (for pain.).     Marland Kitchen  amiodarone (PACERONE) 200 MG tablet TAKE 1 TABLET(200 MG) BY MOUTH DAILY 90 tablet 3  . apixaban (ELIQUIS) 5 MG TABS tablet TAKE 1 TABLET(5 MG) BY MOUTH TWICE DAILY 60 tablet 5  . ASPIRIN LOW DOSE 81 MG chewable tablet Chew 81 mg by mouth every evening.   0  . atorvastatin (LIPITOR) 40 MG tablet TAKE 1 TABLET BY MOUTH DAILY 90 tablet 1  . furosemide (LASIX) 20 MG tablet Take 1-2 tablets (20-40 mg total) by mouth daily as needed. 30 tablet 1  . losartan (COZAAR) 50 MG tablet Take 25 mg by mouth 2 (two) times daily.    . metoprolol tartrate (LOPRESSOR) 50 MG tablet Take 25 mg by mouth 2 (two) times daily.     . nitroGLYCERIN (NITROSTAT) 0.4 MG SL tablet PLACE ONE TABLET UNDER TONGUE AS NEEDED FOR CHEST PAIN EVERY 5 MINUTES AS NEEDED 25 tablet 2  . zaleplon (SONATA) 5 MG capsule Take 5 mg by mouth at bedtime as needed for sleep.     No current facility-administered medications for this encounter.    No Known Allergies  Social History   Socioeconomic History  . Marital status: Married    Spouse name: Not on file  . Number of children: Not on file  . Years of education: Not on file  . Highest education level: Not on file  Occupational History  . Not  on file  Tobacco Use  . Smoking status: Former Smoker    Packs/day: 2.00    Years: 10.00    Pack years: 20.00    Types: Cigarettes    Quit date: 07/09/1973    Years since quitting: 46.4  . Smokeless tobacco: Never Used  Vaping Use  . Vaping Use: Never used  Substance and Sexual Activity  . Alcohol use: Yes    Alcohol/week: 2.0 standard drinks    Types: 2 Glasses of wine per week  . Drug use: No  . Sexual activity: Not Currently    Birth control/protection: Abstinence  Other Topics Concern  . Not on file  Social History Narrative  . Not on file   Social Determinants of Health   Financial Resource Strain:   . Difficulty of Paying Living Expenses: Not on file  Food Insecurity:   . Worried About Programme researcher, broadcasting/film/video in the Last  Year: Not on file  . Ran Out of Food in the Last Year: Not on file  Transportation Needs:   . Lack of Transportation (Medical): Not on file  . Lack of Transportation (Non-Medical): Not on file  Physical Activity:   . Days of Exercise per Week: Not on file  . Minutes of Exercise per Session: Not on file  Stress:   . Feeling of Stress : Not on file  Social Connections:   . Frequency of Communication with Friends and Family: Not on file  . Frequency of Social Gatherings with Friends and Family: Not on file  . Attends Religious Services: Not on file  . Active Member of Clubs or Organizations: Not on file  . Attends Banker Meetings: Not on file  . Marital Status: Not on file  Intimate Partner Violence:   . Fear of Current or Ex-Partner: Not on file  . Emotionally Abused: Not on file  . Physically Abused: Not on file  . Sexually Abused: Not on file    Family History  Problem Relation Age of Onset  . Heart failure Father   . Other Mother        bad fall  . Clotting disorder Mother   . Cancer Sister   . Hypertension Sister   . Heart disease Brother     ROS- All systems are reviewed and negative except as per the HPI above  Physical Exam: Vitals:   12/20/19 1534  BP: 118/68  Pulse: (!) 47  Weight: 123.8 kg  Height: 5\' 11"  (1.803 m)   Wt Readings from Last 3 Encounters:  12/20/19 123.8 kg  12/13/19 126.9 kg  12/07/19 122.5 kg    Labs: Lab Results  Component Value Date   NA 140 12/07/2019   K 4.8 12/07/2019   CL 103 12/07/2019   CO2 24 11/21/2019   GLUCOSE 104 (H) 12/07/2019   BUN 16 12/07/2019   CREATININE 1.10 12/07/2019   CALCIUM 8.5 (L) 11/21/2019   No results found for: INR Lab Results  Component Value Date   CHOL 96 (L) 04/09/2018   HDL 31 (L) 04/09/2018   LDLCALC 50 04/09/2018   TRIG 77 04/09/2018    GEN- The patient is well appearing obese male, alert and oriented x 3 today.   HEENT-head normocephalic, atraumatic, sclera clear,  conjunctiva pink, hearing intact, trachea midline. Lungs- Clear to ausculation bilaterally, normal work of breathing Heart- Regular rate and rhythm, bradycardia, no murmurs, rubs or gallops  GI- soft, NT, ND, + BS Extremities- no clubbing, cyanosis, or edema  MS- no significant deformity or atrophy Skin- no rash or lesion Psych- euthymic mood, full affect Neuro- strength and sensation are intact   EKG- SB HR 47, 1st degree AV block, PR 212, QRS 108, QTc 398  Echo 05/22/18 demonstrates - Procedure narrative: Transthoracic echocardiography. Image   quality was poor. The study was technically difficult, as a   result of poor acoustic windows. Intravenous contrast (Definity)   was administered. - Left ventricle: The cavity size was normal. Wall thickness was increased in a pattern of mild LVH. Systolic function was normal.   The estimated ejection fraction was in the range of 60% to 65%.   Wall motion was normal; there were no regional wall motion   abnormalities. Doppler parameters are consistent with restrictive   physiology, indicative of decreased left ventricular diastolic   compliance and/or increased left atrial pressure. Doppler   parameters are consistent with high ventricular filling pressure. - Aortic valve: Mildly calcified annulus. Trileaflet. There was   mild regurgitation. - Mitral valve: Moderately calcified annulus. Normal thickness   leaflets . There was mild regurgitation. - Tricuspid valve: There was mild regurgitation. - Systemic veins: IVC poorly visualized but appears dilated.  Assessment and Plan: 1. Persistent atrial fibrillation S/p ablation 05/20/18 with repeat ablation 09/02/19. S/p DCCV on 12/07/19. Continue amiodarone 200 mg daily  Continue Eliquis 5 mg BID Continue Lopressor to 12.5 mg BID, can take an extra 1/2 tab if heart races  This patients CHA2DS2-VASc Score and unadjusted Ischemic Stroke Rate (% per year) is equal to 3.2 % stroke rate/year from a  score of 3  Above score calculated as 1 point each if present [CHF, HTN, DM, Vascular=MI/PAD/Aortic Plaque, Age if 65-74, or Male] Above score calculated as 2 points each if present [Age > 75, or Stroke/TIA/TE]  2. CAD S/p DES to RCA and LCx.  No anginal symptoms.  3. HTN  Stable, no changes today.  4. OSA Patient reports compliance with CPAP therapy.   5. Obesity Body mass index is 38.08 kg/m. Lifestyle modification was discussed and encouraged including regular physical activity and weight reduction.   Follow up with Dr Elberta Fortis as scheduled.    [name] PA-C [facility] [address] [place], [place] [address] [phone]

## 2020-01-12 DIAGNOSIS — Z4789 Encounter for other orthopedic aftercare: Secondary | ICD-10-CM | POA: Diagnosis not present

## 2020-01-12 DIAGNOSIS — S62617D Displaced fracture of proximal phalanx of left little finger, subsequent encounter for fracture with routine healing: Secondary | ICD-10-CM | POA: Diagnosis not present

## 2020-02-15 ENCOUNTER — Other Ambulatory Visit: Payer: Self-pay | Admitting: Cardiovascular Disease

## 2020-03-02 ENCOUNTER — Ambulatory Visit (HOSPITAL_COMMUNITY)
Admission: RE | Admit: 2020-03-02 | Discharge: 2020-03-02 | Disposition: A | Discharge status: Home / Self Care | Payer: Medicare Other | Source: Ambulatory Visit | Attending: Physician Assistant | Admitting: Physician Assistant

## 2020-03-02 ENCOUNTER — Encounter (HOSPITAL_COMMUNITY): Payer: Self-pay | Admitting: Physician Assistant

## 2020-03-02 ENCOUNTER — Other Ambulatory Visit: Payer: Self-pay

## 2020-03-02 VITALS — BP 132/90 | HR 67 | Ht 71.0 in | Wt 276.6 lb

## 2020-03-02 DIAGNOSIS — Z7901 Long term (current) use of anticoagulants: Secondary | ICD-10-CM | POA: Insufficient documentation

## 2020-03-02 DIAGNOSIS — I4819 Other persistent atrial fibrillation: Secondary | ICD-10-CM | POA: Insufficient documentation

## 2020-03-02 DIAGNOSIS — I2510 Atherosclerotic heart disease of native coronary artery without angina pectoris: Secondary | ICD-10-CM | POA: Diagnosis not present

## 2020-03-02 DIAGNOSIS — I10 Essential (primary) hypertension: Secondary | ICD-10-CM | POA: Insufficient documentation

## 2020-03-02 DIAGNOSIS — Z6838 Body mass index (BMI) 38.0-38.9, adult: Secondary | ICD-10-CM | POA: Diagnosis not present

## 2020-03-02 DIAGNOSIS — E669 Obesity, unspecified: Secondary | ICD-10-CM | POA: Diagnosis not present

## 2020-03-02 DIAGNOSIS — D6869 Other thrombophilia: Secondary | ICD-10-CM | POA: Diagnosis not present

## 2020-03-02 DIAGNOSIS — G4733 Obstructive sleep apnea (adult) (pediatric): Secondary | ICD-10-CM | POA: Diagnosis not present

## 2020-03-02 DIAGNOSIS — I083 Combined rheumatic disorders of mitral, aortic and tricuspid valves: Secondary | ICD-10-CM | POA: Diagnosis not present

## 2020-03-02 DIAGNOSIS — Z950 Presence of cardiac pacemaker: Secondary | ICD-10-CM | POA: Insufficient documentation

## 2020-03-02 MED ORDER — AMIODARONE HCL 200 MG PO TABS: 200.0000 mg | ORAL_TABLET | Freq: Two times a day (BID) | ORAL | 3 refills | Status: DC

## 2020-03-02 NOTE — Patient Instructions (Signed)
Cardioversion scheduled for Monday, November 22nd  - Arrive at the [facility] and go to admitting at 830AM  - Do not eat or drink anything after midnight the night prior to your procedure.  - Take all your morning medication (except diabetic medications) with a sip of water prior to arrival.  - You will not be able to drive home after your procedure.  - Do NOT miss any doses of your blood thinner - if you should miss a dose please notify our office immediately.  - If you feel as if you go back into normal rhythm prior to scheduled cardioversion, please notify our office immediately. If your procedure is canceled in the cardioversion suite you will be charged a cancellation fee.  Increase amiodarone 200mg  twice a day until cardioversion

## 2020-03-02 NOTE — Progress Notes (Signed)
Primary Care Physician: Renford Dills, MD Referring Physician: Dr. Elberta Fortis Cardiologist: Dr. Madolyn Frieze Ronald Hull is a 74 y.o. male with a h/o persistent afib, CAD, that is in the afib clinic for f/u.  He presented to his primary physician on 10/02/15 with an elevated and irregular heart rate. Cardioversion was attempted and was unsuccessful with repeat cardioversion successfully to 360 J. He was initially placed on amiodarone and has since been switched to Multaq. He went back into atrial fibrillation and had cardioversion 10/12/17.  He was scheduled for ablation, but on his pre-ablation CT scan was found to have coronary artery disease.  He was sent to catheterization and had a drug-eluting stent to his circumflex and RCA on 02/24/2018.  He had A. fib ablation January 2020 and has been started on amiodarone. Patient underwent successful DCCV on 09/22/18. He is on Eliquis for a CHADS2VASC score of 3.   Patient had recurrent afib and underwent DCCV on 07/15/19 but did not convert to SR. He underwent repeat ablation with Dr Elberta Fortis on 09/02/19. Patient was seen on 11/21/19 with rate controlled afib and underwent DCCV on 12/07/19.   On follow up today, patient noted he was back in afib yesterday 11/4. He does not have any symptoms associated with his afib, he only knew he was out of rhythm because of his smart watch. He is in rate controlled afib today. There were no specific triggers that he could identify.   Today, he denies symptoms of palpitations, chest pain, orthopnea, PND, lower extremity edema, dizziness, presyncope, syncope, or neurologic sequela. The patient is tolerating medications without difficulties and is otherwise without complaint today.   Past Medical History:  Diagnosis Date  . Arthritis   . Bronchitis 05/23/2018  . CAD (coronary artery disease) 02/24/2018   LHC 10/19: pLAD 50, mLAD 50 (dFFR=0.89), oLCx 95, pRCA 30, mRCA 80 >> PCI: DES to LCx; DES to RCA // [id number] Trial  participant (will DC Plavix after 30 d and remain on ASA + Apixaban)  . Chest pain secondary to ablation, with expected mild elevation in troponin 05/22/2018  . Complication of anesthesia    BP dropped post colonoscopy  . Hypercholesteremia   . Hypertension   . Lung nodules 02/25/2019   Chest CT 01/2019: LUL nodule unchanged (benign); new LUL nodules 6 mm, aortic atherosclerosis, emphysema, partially calcified pleural plaques suggesting prior empyema or hemothorax  . Persistent atrial fibrillation (HCC)   . Sleep apnea    does use a cpap  . Wears glasses    Past Surgical History:  Procedure Laterality Date  . ATRIAL FIBRILLATION ABLATION N/A 05/20/2018   Procedure: ATRIAL FIBRILLATION ABLATION;  Surgeon: Regan Lemming, MD;  Location: [facility] INVASIVE CV LAB;  Service: Cardiovascular;  Laterality: N/A;  . ATRIAL FIBRILLATION ABLATION N/A 09/02/2019   Procedure: ATRIAL FIBRILLATION ABLATION;  Surgeon: Regan Lemming, MD;  Location: [facility] INVASIVE CV LAB;  Service: Cardiovascular;  Laterality: N/A;  . CARDIOVERSION N/A 11/01/2015   Procedure: CARDIOVERSION;  Surgeon: [name], MD;  Location: [facility] ENDOSCOPY;  Service: Cardiovascular;  Laterality: N/A;  . CARDIOVERSION N/A 11/23/2015   Procedure: CARDIOVERSION;  Surgeon: Will [name], MD;  Location: [facility] ENDOSCOPY;  Service: Cardiovascular;  Laterality: N/A;  . CARDIOVERSION N/A 10/12/2017   Procedure: CARDIOVERSION;  Surgeon: [name], MD;  Location: [facility] ENDOSCOPY;  Service: Cardiovascular;  Laterality: N/A;  . CARDIOVERSION N/A 06/30/2018   Procedure: CARDIOVERSION;  Surgeon: Jake Bathe, MD;  Location:  [facility] ENDOSCOPY;  Service: Cardiovascular;  Laterality: N/A;  . CARDIOVERSION N/A 09/22/2018   Procedure: CARDIOVERSION;  Surgeon: Quintella Reichert, MD;  Location: [facility] ENDOSCOPY;  Service: Cardiovascular;  Laterality: N/A;  . CARDIOVERSION N/A 07/15/2019   Procedure: CARDIOVERSION;  Surgeon: Jodelle Red, MD;  Location: [facility]  ENDOSCOPY;  Service: Cardiovascular;  Laterality: N/A;  . CARDIOVERSION N/A 12/07/2019   Procedure: CARDIOVERSION;  Surgeon: Chilton Si, MD;  Location: [facility] ENDOSCOPY;  Service: Cardiovascular;  Laterality: N/A;  . COLONOSCOPY    . CORONARY STENT INTERVENTION N/A 02/24/2018   Procedure: CORONARY STENT INTERVENTION;  Surgeon: Tonny Bollman, MD;  Location: [facility] INVASIVE CV LAB;  Service: Cardiovascular;  Laterality: N/A;  . I & D EXTREMITY Left 10/19/2019   Procedure: IRRIGATION AND DEBRIDEMENT LEFT SMALL PROXIMAL PHALANX;  Surgeon: Bradly Bienenstock, MD;  Location: [facility];  Service: Orthopedics;  Laterality: Left;  . INTRAVASCULAR PRESSURE WIRE/FFR STUDY N/A 02/24/2018   Procedure: INTRAVASCULAR PRESSURE WIRE/FFR STUDY;  Surgeon: Tonny Bollman, MD;  Location: [facility] INVASIVE CV LAB;  Service: Cardiovascular;  Laterality: N/A;  . KNEE ARTHROSCOPY  2000  . LEFT HEART CATH AND CORONARY ANGIOGRAPHY N/A 02/24/2018   Procedure: LEFT HEART CATH AND CORONARY ANGIOGRAPHY;  Surgeon: Tonny Bollman, MD;  Location: [facility] INVASIVE CV LAB;  Service: Cardiovascular;  Laterality: N/A;  . OPEN REDUCTION INTERNAL FIXATION (ORIF) PROXIMAL PHALANX Left 10/19/2019   Procedure: OPEN REDUCTION INTERNAL FIXATION (ORIF) PROXIMAL PHALANX WITH K-WIRES LEFT SMALL;  Surgeon: Bradly Bienenstock, MD;  Location: [facility];  Service: Orthopedics;  Laterality: Left;  . SHOULDER ARTHROSCOPY WITH ROTATOR CUFF REPAIR AND SUBACROMIAL DECOMPRESSION Right 07/15/2012   Procedure: RIGHT ARTHROSCOPY SHOULDER DECOMPRESSION  SUBACROMIAL PARTIAL ACROMIOPLASTY WITH CORACOACROMIAL RELEASE,  DISTAL CLAVICULECTOMY, resect biceps debride labrium WITH ROTATOR CUFF REPAIR;  Surgeon: [name], MD;  Location: [facility];  Service: Orthopedics;  Laterality: Right;    Current Outpatient Medications  Medication Sig Dispense Refill  . acetaminophen (TYLENOL) 325 MG tablet Take 325 mg by mouth every 6 (six)  hours as needed (for pain.).     Marland Kitchen amiodarone (PACERONE) 200 MG tablet TAKE 1 TABLET(200 MG) BY MOUTH DAILY 90 tablet 3  . apixaban (ELIQUIS) 5 MG TABS tablet TAKE 1 TABLET(5 MG) BY MOUTH TWICE DAILY 60 tablet 5  . ASPIRIN LOW DOSE 81 MG chewable tablet Chew 81 mg by mouth every evening.   0  . atorvastatin (LIPITOR) 40 MG tablet TAKE 1 TABLET BY MOUTH DAILY 90 tablet 2  . furosemide (LASIX) 20 MG tablet Take 1-2 tablets (20-40 mg total) by mouth daily as needed. 30 tablet 1  . losartan (COZAAR) 50 MG tablet Take 25 mg by mouth 2 (two) times daily.    . metoprolol tartrate (LOPRESSOR) 50 MG tablet Take 25 mg by mouth 2 (two) times daily.     . nitroGLYCERIN (NITROSTAT) 0.4 MG SL tablet PLACE ONE TABLET UNDER TONGUE AS NEEDED FOR CHEST PAIN EVERY 5 MINUTES AS NEEDED 25 tablet 2  . zaleplon (SONATA) 5 MG capsule Take 5 mg by mouth at bedtime as needed for sleep.     No current facility-administered medications for this encounter.    No Known Allergies  Social History   Socioeconomic History  . Marital status: Married    Spouse name: Not on file  . Number of children: Not on file  . Years of education: Not on file  . Highest education level: Not on file  Occupational History  . Not  on file  Tobacco Use  . Smoking status: Former Smoker    Packs/day: 2.00    Years: 10.00    Pack years: 20.00    Types: Cigarettes    Quit date: 07/09/1973    Years since quitting: 46.6  . Smokeless tobacco: Never Used  Vaping Use  . Vaping Use: Never used  Substance and Sexual Activity  . Alcohol use: Yes    Alcohol/week: 2.0 standard drinks    Types: 2 Glasses of wine per week  . Drug use: No  . Sexual activity: Not Currently    Birth control/protection: Abstinence  Other Topics Concern  . Not on file  Social History Narrative  . Not on file   Social Determinants of Health   Financial Resource Strain:   . Difficulty of Paying Living Expenses: Not on file  Food Insecurity:   . Worried  About Programme researcher, broadcasting/film/video in the Last Year: Not on file  . Ran Out of Food in the Last Year: Not on file  Transportation Needs:   . Lack of Transportation (Medical): Not on file  . Lack of Transportation (Non-Medical): Not on file  Physical Activity:   . Days of Exercise per Week: Not on file  . Minutes of Exercise per Session: Not on file  Stress:   . Feeling of Stress : Not on file  Social Connections:   . Frequency of Communication with Friends and Family: Not on file  . Frequency of Social Gatherings with Friends and Family: Not on file  . Attends Religious Services: Not on file  . Active Member of Clubs or Organizations: Not on file  . Attends Banker Meetings: Not on file  . Marital Status: Not on file  Intimate Partner Violence:   . Fear of Current or Ex-Partner: Not on file  . Emotionally Abused: Not on file  . Physically Abused: Not on file  . Sexually Abused: Not on file    Family History  Problem Relation Age of Onset  . Heart failure Father   . Other Mother        bad fall  . Clotting disorder Mother   . Cancer Sister   . Hypertension Sister   . Heart disease Brother     ROS- All systems are reviewed and negative except as per the HPI above  Physical Exam: Vitals:   03/02/20 0842  BP: 132/90  Pulse: 67  Weight: 125.5 kg  Height: 5\' 11"  (1.803 m)   Wt Readings from Last 3 Encounters:  03/02/20 125.5 kg  12/20/19 123.8 kg  12/13/19 126.9 kg    Labs: Lab Results  Component Value Date   NA 140 12/07/2019   K 4.8 12/07/2019   CL 103 12/07/2019   CO2 24 11/21/2019   GLUCOSE 104 (H) 12/07/2019   BUN 16 12/07/2019   CREATININE 1.10 12/07/2019   CALCIUM 8.5 (L) 11/21/2019   No results found for: INR Lab Results  Component Value Date   CHOL 96 (L) 04/09/2018   HDL 31 (L) 04/09/2018   LDLCALC 50 04/09/2018   TRIG 77 04/09/2018    GEN- The patient is well appearing obese male, alert and oriented x 3 today.   HEENT-head  normocephalic, atraumatic, sclera clear, conjunctiva pink, hearing intact, trachea midline. Lungs- Clear to ausculation bilaterally, normal work of breathing Heart- irregular rate and rhythm, no murmurs, rubs or gallops  GI- soft, NT, ND, + BS Extremities- no clubbing, cyanosis, or edema MS- no  significant deformity or atrophy Skin- no rash or lesion Psych- euthymic mood, full affect Neuro- strength and sensation are intact   EKG- afib HR 67, QRS 108, QTc 395  Echo 05/22/18 demonstrates - Procedure narrative: Transthoracic echocardiography. Image   quality was poor. The study was technically difficult, as a   result of poor acoustic windows. Intravenous contrast (Definity)   was administered. - Left ventricle: The cavity size was normal. Wall thickness was increased in a pattern of mild LVH. Systolic function was normal.   The estimated ejection fraction was in the range of 60% to 65%.   Wall motion was normal; there were no regional wall motion   abnormalities. Doppler parameters are consistent with restrictive   physiology, indicative of decreased left ventricular diastolic   compliance and/or increased left atrial pressure. Doppler   parameters are consistent with high ventricular filling pressure. - Aortic valve: Mildly calcified annulus. Trileaflet. There was   mild regurgitation. - Mitral valve: Moderately calcified annulus. Normal thickness   leaflets . There was mild regurgitation. - Tricuspid valve: There was mild regurgitation. - Systemic veins: IVC poorly visualized but appears dilated.  Assessment and Plan: 1. Persistent atrial fibrillation S/p ablation 05/20/18 with repeat ablation 09/02/19. Patient in rate controlled afib today. Increase amiodarone to 200 mg BID. If he does not convert with the medication, will plan for DCCV. We also discussed the possibility of rate control today if he fails DCCV given his paucity of symptoms and good rate control.   Continue Eliquis  5 mg BID Continue Lopressor to 12.5 mg BID, can take an extra 1/2 tab if heart races  This patients CHA2DS2-VASc Score and unadjusted Ischemic Stroke Rate (% per year) is equal to 3.2 % stroke rate/year from a score of 3  Above score calculated as 1 point each if present [CHF, HTN, DM, Vascular=MI/PAD/Aortic Plaque, Age if 65-74, or Male] Above score calculated as 2 points each if present [Age > 75, or Stroke/TIA/TE]  2. CAD S/p DES to RCA and LCx.  No anginal symptoms.  3. HTN  Stable, no changes today.   4. OSA Patient reports compliance with CPAP therapy.  5. Obesity Body mass index is 38.58 kg/m. Lifestyle modification was discussed and encouraged including regular physical activity and weight reduction.   Follow up with Dr Elberta Fortis as scheduled post DCCV. AF clinic in 2 weeks for labs.   [name] PA-C Afib Clinic [facility] [address] [place], [place] [address] [phone]

## 2020-03-15 ENCOUNTER — Other Ambulatory Visit: Payer: Self-pay

## 2020-03-15 ENCOUNTER — Ambulatory Visit (HOSPITAL_COMMUNITY)
Admission: RE | Admit: 2020-03-15 | Discharge: 2020-03-15 | Disposition: A | Discharge status: Home / Self Care | Payer: Medicare Other | Source: Ambulatory Visit | Attending: Physician Assistant | Admitting: Physician Assistant

## 2020-03-15 DIAGNOSIS — I4891 Unspecified atrial fibrillation: Secondary | ICD-10-CM | POA: Insufficient documentation

## 2020-03-15 DIAGNOSIS — I4819 Other persistent atrial fibrillation: Secondary | ICD-10-CM

## 2020-03-15 LAB — BASIC METABOLIC PANEL
Anion gap: 10 (ref 5–15)
BUN: 18 mg/dL (ref 8–23)
CO2: 20 mmol/L — ABNORMAL LOW (ref 22–32)
Calcium: 8.9 mg/dL (ref 8.9–10.3)
Chloride: 107 mmol/L (ref 98–111)
Creatinine, Ser: 1.29 mg/dL — ABNORMAL HIGH (ref 0.61–1.24)
GFR, Estimated: 58 mL/min — ABNORMAL LOW (ref >60)
Glucose, Bld: 108 mg/dL — ABNORMAL HIGH (ref 70–99)
Potassium: 4.8 mmol/L (ref 3.5–5.1)
Sodium: 137 mmol/L (ref 135–145)

## 2020-03-15 LAB — CBC
HCT: 45.8 % (ref 39.0–52.0)
Hemoglobin: 14.9 g/dL (ref 13.0–17.0)
MCH: 31.6 pg (ref 26.0–34.0)
MCHC: 32.5 g/dL (ref 30.0–36.0)
MCV: 97 fL (ref 80.0–100.0)
Platelets: 174 10*3/uL (ref 150–400)
RBC: 4.72 MIL/uL (ref 4.22–5.81)
RDW: 13.9 % (ref 11.5–15.5)
WBC: 7.5 10*3/uL (ref 4.0–10.5)
nRBC: 0 % (ref 0.0–0.2)

## 2020-03-15 NOTE — H&P (View-Only) (Signed)
Patient presents for ECG after increasing amiodarone. ECG shows afib HR 77, QRS 110, QTc 416. Will proceed with DCCV as scheduled. Check bmet/cbc today. Decrease amiodarone back to 200 mg daily post DCCV.

## 2020-03-15 NOTE — Progress Notes (Signed)
Patient presents for ECG after increasing amiodarone. ECG shows afib HR 77, QRS 110, QTc 416. Will proceed with DCCV as scheduled. Check bmet/cbc today. Decrease amiodarone back to 200 mg daily post DCCV.  

## 2020-03-17 ENCOUNTER — Other Ambulatory Visit (HOSPITAL_COMMUNITY)
Admission: RE | Admit: 2020-03-17 | Discharge: 2020-03-17 | Disposition: A | Discharge status: Home / Self Care | Payer: Medicare Other | Source: Ambulatory Visit | Attending: Cardiology | Admitting: Cardiology

## 2020-03-17 DIAGNOSIS — Z20822 Contact with and (suspected) exposure to covid-19: Secondary | ICD-10-CM | POA: Diagnosis not present

## 2020-03-17 DIAGNOSIS — Z01812 Encounter for preprocedural laboratory examination: Secondary | ICD-10-CM | POA: Diagnosis not present

## 2020-03-17 LAB — SARS CORONAVIRUS 2 (TAT 6-24 HRS): SARS Coronavirus 2: NEGATIVE

## 2020-03-19 ENCOUNTER — Ambulatory Visit (HOSPITAL_COMMUNITY): Payer: Medicare Other | Admitting: Certified Registered"

## 2020-03-19 ENCOUNTER — Encounter (HOSPITAL_COMMUNITY): Payer: Self-pay | Admitting: Cardiology

## 2020-03-19 ENCOUNTER — Ambulatory Visit (HOSPITAL_COMMUNITY)
Admission: RE | Admit: 2020-03-19 | Discharge: 2020-03-19 | Disposition: A | Discharge status: Home / Self Care | Payer: Medicare Other | Attending: Cardiology | Admitting: Cardiology

## 2020-03-19 ENCOUNTER — Other Ambulatory Visit: Payer: Self-pay

## 2020-03-19 ENCOUNTER — Encounter (HOSPITAL_COMMUNITY)
Admission: RE | Disposition: A | Discharge status: Home / Self Care | Payer: Self-pay | Source: Home / Self Care | Attending: Cardiology

## 2020-03-19 DIAGNOSIS — I4891 Unspecified atrial fibrillation: Secondary | ICD-10-CM | POA: Insufficient documentation

## 2020-03-19 DIAGNOSIS — I10 Essential (primary) hypertension: Secondary | ICD-10-CM | POA: Diagnosis not present

## 2020-03-19 DIAGNOSIS — E785 Hyperlipidemia, unspecified: Secondary | ICD-10-CM | POA: Diagnosis not present

## 2020-03-19 DIAGNOSIS — I4819 Other persistent atrial fibrillation: Secondary | ICD-10-CM

## 2020-03-19 DIAGNOSIS — I2510 Atherosclerotic heart disease of native coronary artery without angina pectoris: Secondary | ICD-10-CM | POA: Diagnosis not present

## 2020-03-19 HISTORY — PX: CARDIOVERSION: SHX1299

## 2020-03-19 SURGERY — CARDIOVERSION
Anesthesia: General

## 2020-03-19 MED ORDER — FUROSEMIDE 20 MG PO TABS
20.0000 mg | ORAL_TABLET | Freq: Every day | ORAL | Status: DC | PRN
Start: 2020-03-19 — End: 2022-08-01

## 2020-03-19 MED ORDER — SODIUM CHLORIDE 0.9 % IV SOLN: Freq: Once | INTRAVENOUS | Status: AC

## 2020-03-19 MED ORDER — SODIUM CHLORIDE 0.9 % IV SOLN: INTRAVENOUS | Status: DC | PRN

## 2020-03-19 MED ORDER — LIDOCAINE HCL (CARDIAC) PF 100 MG/5ML IV SOSY
PREFILLED_SYRINGE | INTRAVENOUS | Status: DC | PRN
  Administered 2020-03-19: 100 mg via INTRAVENOUS

## 2020-03-19 MED ORDER — PROPOFOL 10 MG/ML IV BOLUS
INTRAVENOUS | Status: DC | PRN
  Administered 2020-03-19: 100 mg via INTRAVENOUS

## 2020-03-19 MED ORDER — APIXABAN 5 MG PO TABS
5.0000 mg | ORAL_TABLET | Freq: Two times a day (BID) | ORAL | Status: DC
Start: 2020-03-19 — End: 2020-08-29

## 2020-03-19 NOTE — CV Procedure (Signed)
   Electrical Cardioversion Procedure Note Ronald Hull [id number] 05/26/1945  Procedure: Electrical Cardioversion Indications:  Atrial Fibrillation  Time Out: Verified patient identification, verified procedure,medications/allergies/relevent history reviewed, required imaging and test results available.  Performed  Procedure Details  The patient was NPO after midnight. Anesthesia was administered at the beside  by Dr.Rose with 100mg  of propofol and 100mg  Lidocaine.  Cardioversion was done with synchronized biphasic defibrillation with AP pads with 150watts.  The patient converted to normal sinus rhythm. The patient tolerated the procedure well   IMPRESSION:  Successful cardioversion of atrial fibrillation    Ronald Hull 03/19/2020, 11:23 AM

## 2020-03-19 NOTE — Discharge Instructions (Signed)
Electrical Cardioversion Electrical cardioversion is the delivery of a jolt of electricity to restore a normal rhythm to the heart. A rhythm that is too fast or is not regular keeps the heart from pumping well. In this procedure, sticky patches or metal paddles are placed on the chest to deliver electricity to the heart from a device. This procedure may be done in an emergency if:  There is low or no blood pressure as a result of the heart rhythm.  Normal rhythm must be restored as fast as possible to protect the brain and heart from further damage.  It may save a life. This may also be a scheduled procedure for irregular or fast heart rhythms that are not immediately life-threatening. Tell a health care provider about:  Any allergies you have.  All medicines you are taking, including vitamins, herbs, eye drops, creams, and over-the-counter medicines.  Any problems you or family members have had with anesthetic medicines.  Any blood disorders you have.  Any surgeries you have had.  Any medical conditions you have.  Whether you are pregnant or may be pregnant. What are the risks? Generally, this is a safe procedure. However, problems may occur, including:  Allergic reactions to medicines.  A blood clot that breaks free and travels to other parts of your body.  The possible return of an abnormal heart rhythm within hours or days after the procedure.  Your heart stopping (cardiac arrest). This is rare. What happens before the procedure? Medicines  Your health care provider may have you start taking: ? Blood-thinning medicines (anticoagulants) so your blood does not clot as easily. ? Medicines to help stabilize your heart rate and rhythm.  Ask your health care provider about: ? Changing or stopping your regular medicines. This is especially important if you are taking diabetes medicines or blood thinners. ? Taking medicines such as aspirin and ibuprofen. These medicines can  thin your blood. Do not take these medicines unless your health care provider tells you to take them. ? Taking over-the-counter medicines, vitamins, herbs, and supplements. General instructions  Follow instructions from your health care provider about eating or drinking restrictions.  Plan to have someone take you home from the hospital or clinic.  If you will be going home right after the procedure, plan to have someone with you for 24 hours.  Ask your health care provider what steps will be taken to help prevent infection. These may include washing your skin with a germ-killing soap. What happens during the procedure?   An IV will be inserted into one of your veins.  Sticky patches (electrodes) or metal paddles may be placed on your chest.  You will be given a medicine to help you relax (sedative).  An electrical shock will be delivered. The procedure may vary among health care providers and hospitals. What can I expect after the procedure?  Your blood pressure, heart rate, breathing rate, and blood oxygen level will be monitored until you leave the hospital or clinic.  Your heart rhythm will be watched to make sure it does not change.  You may have some redness on the skin where the shocks were given. Follow these instructions at home:  Do not drive for 24 hours if you were given a sedative during your procedure.  Take over-the-counter and prescription medicines only as told by your health care provider.  Ask your health care provider how to check your pulse. Check it often.  Rest for 48 hours after the procedure or   as told by your health care provider.  Avoid or limit your caffeine use as told by your health care provider.  Keep all follow-up visits as told by your health care provider. This is important. Contact a health care provider if:  You feel like your heart is beating too quickly or your pulse is not regular.  You have a serious muscle cramp that does not go  away. Get help right away if:  You have discomfort in your chest.  You are dizzy or you feel faint.  You have trouble breathing or you are short of breath.  Your speech is slurred.  You have trouble moving an arm or leg on one side of your body.  Your fingers or toes turn cold or blue. Summary  Electrical cardioversion is the delivery of a jolt of electricity to restore a normal rhythm to the heart.  This procedure may be done right away in an emergency or may be a scheduled procedure if the condition is not an emergency.  Generally, this is a safe procedure.  After the procedure, check your pulse often as told by your health care provider. This information is not intended to replace advice given to you by your health care provider. Make sure you discuss any questions you have with your health care provider. Document Revised: 11/15/2018 Document Reviewed: 11/15/2018 Elsevier Patient Education  2020 [employer]  

## 2020-03-19 NOTE — Progress Notes (Signed)
Dr. Mayford Knife aware that pt's heart rate is in the mid 40's. Per pt this is normal after being cardioverted back into normal sinus rhythm. Pt asymptomatic. Per Dr. Mayford Knife the pt is to hold metoprolol until his appt with Dr. Elberta Fortis and is ok to discharge home. Pt verbalizes understanding. Weston Settle, RN

## 2020-03-19 NOTE — Anesthesia Preprocedure Evaluation (Signed)
Anesthesia Evaluation  Patient identified by MRN, date of birth, ID band Patient awake    Reviewed: Allergy & Precautions, NPO status , Patient's Chart, lab work & pertinent test results  Airway Mallampati: II  TM Distance: >3 FB Neck ROM: Full    Dental no notable dental hx.    Pulmonary sleep apnea , COPD, former smoker,    Pulmonary exam normal breath sounds clear to auscultation       Cardiovascular hypertension, + CAD and + Cardiac Stents  + dysrhythmias Atrial Fibrillation  Rhythm:Irregular Rate:Normal     Neuro/Psych negative neurological ROS  negative psych ROS   GI/Hepatic negative GI ROS, Neg liver ROS,   Endo/Other  negative endocrine ROS  Renal/GU negative Renal ROS  negative genitourinary   Musculoskeletal negative musculoskeletal ROS (+)   Abdominal   Peds negative pediatric ROS (+)  Hematology negative hematology ROS (+)   Anesthesia Other Findings   Reproductive/Obstetrics negative OB ROS                             Anesthesia Physical Anesthesia Plan  ASA: III  Anesthesia Plan: General   Post-op Pain Management:    Induction: Intravenous  PONV Risk Score and Plan: 0  Airway Management Planned: Mask  Additional Equipment:   Intra-op Plan:   Post-operative Plan: Extubation in OR  Informed Consent: I have reviewed the patients History and Physical, chart, labs and discussed the procedure including the risks, benefits and alternatives for the proposed anesthesia with the patient or authorized representative who has indicated his/her understanding and acceptance.     Dental advisory given  Plan Discussed with: CRNA and Surgeon  Anesthesia Plan Comments:         Anesthesia Quick Evaluation

## 2020-03-19 NOTE — Anesthesia Postprocedure Evaluation (Signed)
Anesthesia Post Note  Patient: Ronald Hull  Procedure(s) Performed: CARDIOVERSION (N/A )     Patient location during evaluation: PACU Anesthesia Type: General Level of consciousness: awake and alert Pain management: pain level controlled Vital Signs Assessment: post-procedure vital signs reviewed and stable Respiratory status: spontaneous breathing, nonlabored ventilation, respiratory function stable and patient connected to nasal cannula oxygen Cardiovascular status: blood pressure returned to baseline and stable Postop Assessment: no apparent nausea or vomiting Anesthetic complications: no   No complications documented.  Last Vitals:  Vitals:   03/19/20 1120 03/19/20 1208  BP: (!) 174/109 135/73  Pulse: 79 (!) 44  Resp: (!) 21 (!) 21  Temp: 36.8 C (!) 36.3 C  SpO2: 97% 95%    Last Pain:  Vitals:   03/19/20 1208  TempSrc: Temporal  PainSc: 0-No pain                 Yousof Alderman S

## 2020-03-19 NOTE — Interval H&P Note (Signed)
History and Physical Interval Note:  03/19/2020 11:22 AM  Ronald Hull  has presented today for surgery, with the diagnosis of AFIB.  The various methods of treatment have been discussed with the patient and family. After consideration of risks, benefits and other options for treatment, the patient has consented to  Procedure(s): CARDIOVERSION (N/A) as a surgical intervention.  The patient's history has been reviewed, patient examined, no change in status, stable for surgery.  I have reviewed the patient's chart and labs.  Questions were answered to the patient's satisfaction.     Armanda Magic

## 2020-03-19 NOTE — Transfer of Care (Signed)
Immediate Anesthesia Transfer of Care Note  Patient: Ronald Hull  Procedure(s) Performed: CARDIOVERSION (N/A )  Patient Location: Endoscopy Unit  Anesthesia Type:General  Level of Consciousness: awake, alert  and oriented  Airway & Oxygen Therapy: Patient connected to nasal cannula oxygen  Post-op Assessment: Post -op Vital signs reviewed and stable  Post vital signs: stable  Last Vitals:  Vitals Value Taken Time  BP    Temp    Pulse    Resp    SpO2      Last Pain:  Vitals:   03/19/20 1155  TempSrc:   PainSc: 0-No pain         Complications: No complications documented.

## 2020-03-20 ENCOUNTER — Encounter (HOSPITAL_COMMUNITY): Payer: Self-pay | Admitting: Cardiology

## 2020-03-27 ENCOUNTER — Other Ambulatory Visit: Payer: Self-pay

## 2020-03-27 ENCOUNTER — Telehealth: Payer: Self-pay | Admitting: Cardiology

## 2020-03-27 ENCOUNTER — Ambulatory Visit: Payer: Medicare Other | Admitting: Cardiology

## 2020-03-27 ENCOUNTER — Encounter: Payer: Self-pay | Admitting: Cardiology

## 2020-03-27 VITALS — BP 160/70 | HR 48 | Ht 71.0 in | Wt 281.0 lb

## 2020-03-27 DIAGNOSIS — I4819 Other persistent atrial fibrillation: Secondary | ICD-10-CM | POA: Diagnosis not present

## 2020-03-27 MED ORDER — LOSARTAN POTASSIUM 50 MG PO TABS: 50.0000 mg | ORAL_TABLET | Freq: Two times a day (BID) | ORAL | 1 refills | Status: DC

## 2020-03-27 MED ORDER — METOPROLOL SUCCINATE ER 50 MG PO TB24: 50.0000 mg | ORAL_TABLET | Freq: Every day | ORAL | 3 refills | Status: DC

## 2020-03-27 MED ORDER — LOSARTAN POTASSIUM 50 MG PO TABS: 50.0000 mg | ORAL_TABLET | Freq: Every day | ORAL | 1 refills | Status: DC

## 2020-03-27 NOTE — Patient Instructions (Addendum)
Medication Instructions:  Your physician has recommended you make the following change in your medication:  1. STOP taking Metoprolol Tartrate (Lopressor) 2. START taking Metoprolol Succinate (Toprol) 50 mg at night 3. INCREASE Cozaar to 50 mg twice daily 4. DECREASE Amiodarone to 200 mg ONCE daily in 1 month  *If you need a refill on your cardiac medications before your next appointment, please call your pharmacy*   Lab Work: None ordered   Testing/Procedures: None ordered   Follow-Up: At [facility], you and your health needs are our priority.  As part of our continuing mission to provide you with exceptional heart care, we have created designated Provider Care Teams.  These Care Teams include your primary Cardiologist (physician) and Advanced Practice Providers (APPs -  Physician Assistants and Nurse Practitioners) who all work together to provide you with the care you need, when you need it.   Your next appointment:   3 month(s)  The format for your next appointment:   In Person  Provider:   Loman Brooklyn, MD    Thank you for choosing [facility]!!   Dory Horn, RN [phone]

## 2020-03-27 NOTE — Telephone Encounter (Signed)
    Pt c/o medication issue:  1. Name of Medication:   losartan (COZAAR) 50 MG tablet    2. How are you currently taking this medication (dosage and times per day)? Take 1 tablet (50 mg total) by mouth daily.  3. Are you having a reaction (difficulty breathing--STAT)?   4. What is your medication issue? Pt wife said Dr. Elberta Fortis changed his dosage to taking losartan twice a day. Need new prescription send to pharmacy. She said they need to get this straighten out before they leave on Thursday for their vacation

## 2020-03-27 NOTE — Telephone Encounter (Signed)
Pt wife called in to check on the status of this med increase.  The are leaving for vacation and need this asap   Pharmacy - [facility] on [address]

## 2020-03-27 NOTE — Telephone Encounter (Signed)
Corrected Rx sent in per request. Wife appreciates the return call

## 2020-03-27 NOTE — Progress Notes (Signed)
Electrophysiology Office Note   Date:  03/27/2020   ID:  MACKINLEY KIEHN, DOB 06/13/1945, MRN [id number]  PCP:  Renford Dills, MD   Primary Electrophysiologist:  Regan Lemming, MD    No chief complaint on file.    History of Present Illness: Ronald Hull is a 74 y.o. male who presents today for electrophysiology evaluation.     He initially presented to his primary physician 10/02/2015 with an elevated and irregular heart rate.  He was found to be in atrial fibrillation.  Cardioversion was performed.  He was initially placed on amiodarone but was switched to Multaq.  He went back into atrial fibrillation and had cardioversion 10/12/2017.  He was initially scheduled for ablation but his preablation CT showed significant coronary artery disease and he is now status post drug-eluting stents to the circumflex and RCA.  He had AF ablation January 2020.  He unfortunately went back into atrial fibrillation and is now status post repeat AF ablation 09/02/2019.  He has had 2 cardioversions since his repeat ablation.  Today, denies symptoms of palpitations, chest pain, shortness of breath, orthopnea, PND, lower extremity edema, claudication, dizziness, presyncope, syncope, bleeding, or neurologic sequela. The patient is tolerating medications without difficulties.  Since his cardioversion he has done well.  He has no shortness of breath or chest pain.  He is unaware of any further episodes of atrial fibrillation.  He is able to do all of his daily activities.  Past Medical History:  Diagnosis Date  . Arthritis   . Bronchitis 05/23/2018  . CAD (coronary artery disease) 02/24/2018   LHC 10/19: pLAD 50, mLAD 50 (dFFR=0.89), oLCx 95, pRCA 30, mRCA 80 >> PCI: DES to LCx; DES to RCA // [id number] Trial participant (Dany Harten DC Plavix after 30 d and remain on ASA + Apixaban)  . Chest pain secondary to ablation, with expected mild elevation in troponin 05/22/2018  . Complication of anesthesia    BP  dropped post colonoscopy  . Hypercholesteremia   . Hypertension   . Lung nodules 02/25/2019   Chest CT 01/2019: LUL nodule unchanged (benign); new LUL nodules 6 mm, aortic atherosclerosis, emphysema, partially calcified pleural plaques suggesting prior empyema or hemothorax  . Persistent atrial fibrillation (HCC)   . Sleep apnea    does use a cpap  . Wears glasses    Past Surgical History:  Procedure Laterality Date  . ATRIAL FIBRILLATION ABLATION N/A 05/20/2018   Procedure: ATRIAL FIBRILLATION ABLATION;  Surgeon: Regan Lemming, MD;  Location: [facility] INVASIVE CV LAB;  Service: Cardiovascular;  Laterality: N/A;  . ATRIAL FIBRILLATION ABLATION N/A 09/02/2019   Procedure: ATRIAL FIBRILLATION ABLATION;  Surgeon: Regan Lemming, MD;  Location: [facility] INVASIVE CV LAB;  Service: Cardiovascular;  Laterality: N/A;  . CARDIOVERSION N/A 11/01/2015   Procedure: CARDIOVERSION;  Surgeon: [name], MD;  Location: [facility] ENDOSCOPY;  Service: Cardiovascular;  Laterality: N/A;  . CARDIOVERSION N/A 11/23/2015   Procedure: CARDIOVERSION;  Surgeon: Mitchel Delduca [name], MD;  Location: [facility] ENDOSCOPY;  Service: Cardiovascular;  Laterality: N/A;  . CARDIOVERSION N/A 10/12/2017   Procedure: CARDIOVERSION;  Surgeon: [name], MD;  Location: [facility] ENDOSCOPY;  Service: Cardiovascular;  Laterality: N/A;  . CARDIOVERSION N/A 06/30/2018   Procedure: CARDIOVERSION;  Surgeon: Jake Bathe, MD;  Location: [facility] ENDOSCOPY;  Service: Cardiovascular;  Laterality: N/A;  . CARDIOVERSION N/A 09/22/2018   Procedure: CARDIOVERSION;  Surgeon: Quintella Reichert, MD;  Location: [facility] ENDOSCOPY;  Service: Cardiovascular;  Laterality: N/A;  .  CARDIOVERSION N/A 07/15/2019   Procedure: CARDIOVERSION;  Surgeon: Jodelle Red, MD;  Location: [facility] ENDOSCOPY;  Service: Cardiovascular;  Laterality: N/A;  . CARDIOVERSION N/A 12/07/2019   Procedure: CARDIOVERSION;  Surgeon: Chilton Si, MD;  Location: [facility] ENDOSCOPY;  Service:  Cardiovascular;  Laterality: N/A;  . CARDIOVERSION N/A 03/19/2020   Procedure: CARDIOVERSION;  Surgeon: Quintella Reichert, MD;  Location: [facility] ENDOSCOPY;  Service: Cardiovascular;  Laterality: N/A;  . COLONOSCOPY    . CORONARY STENT INTERVENTION N/A 02/24/2018   Procedure: CORONARY STENT INTERVENTION;  Surgeon: Tonny Bollman, MD;  Location: [facility] INVASIVE CV LAB;  Service: Cardiovascular;  Laterality: N/A;  . I & D EXTREMITY Left 10/19/2019   Procedure: IRRIGATION AND DEBRIDEMENT LEFT SMALL PROXIMAL PHALANX;  Surgeon: Bradly Bienenstock, MD;  Location: [facility];  Service: Orthopedics;  Laterality: Left;  . INTRAVASCULAR PRESSURE WIRE/FFR STUDY N/A 02/24/2018   Procedure: INTRAVASCULAR PRESSURE WIRE/FFR STUDY;  Surgeon: Tonny Bollman, MD;  Location: [facility] INVASIVE CV LAB;  Service: Cardiovascular;  Laterality: N/A;  . KNEE ARTHROSCOPY  2000  . LEFT HEART CATH AND CORONARY ANGIOGRAPHY N/A 02/24/2018   Procedure: LEFT HEART CATH AND CORONARY ANGIOGRAPHY;  Surgeon: Tonny Bollman, MD;  Location: [facility] INVASIVE CV LAB;  Service: Cardiovascular;  Laterality: N/A;  . OPEN REDUCTION INTERNAL FIXATION (ORIF) PROXIMAL PHALANX Left 10/19/2019   Procedure: OPEN REDUCTION INTERNAL FIXATION (ORIF) PROXIMAL PHALANX WITH K-WIRES LEFT SMALL;  Surgeon: Bradly Bienenstock, MD;  Location: [facility];  Service: Orthopedics;  Laterality: Left;  . SHOULDER ARTHROSCOPY WITH ROTATOR CUFF REPAIR AND SUBACROMIAL DECOMPRESSION Right 07/15/2012   Procedure: RIGHT ARTHROSCOPY SHOULDER DECOMPRESSION  SUBACROMIAL PARTIAL ACROMIOPLASTY WITH CORACOACROMIAL RELEASE,  DISTAL CLAVICULECTOMY, resect biceps debride labrium WITH ROTATOR CUFF REPAIR;  Surgeon: [name], MD;  Location: [facility];  Service: Orthopedics;  Laterality: Right;     Current Outpatient Medications  Medication Sig Dispense Refill  . acetaminophen (TYLENOL) 325 MG tablet Take 325 mg by mouth every 6 (six) hours as needed (for  pain.).     Marland Kitchen amiodarone (PACERONE) 200 MG tablet Take 1 tablet (200 mg total) by mouth 2 (two) times daily. 90 tablet 3  . apixaban (ELIQUIS) 5 MG TABS tablet Take 1 tablet (5 mg total) by mouth 2 (two) times daily.    . ASPIRIN LOW DOSE 81 MG chewable tablet Chew 81 mg by mouth every evening.   0  . atorvastatin (LIPITOR) 40 MG tablet TAKE 1 TABLET BY MOUTH DAILY (Patient taking differently: Take 40 mg by mouth daily. ) 90 tablet 2  . furosemide (LASIX) 20 MG tablet Take 1-2 tablets (20-40 mg total) by mouth daily as needed for fluid.    . nitroGLYCERIN (NITROSTAT) 0.4 MG SL tablet PLACE ONE TABLET UNDER TONGUE AS NEEDED FOR CHEST PAIN EVERY 5 MINUTES AS NEEDED (Patient taking differently: Place 0.4 mg under the tongue every 5 (five) minutes as needed for chest pain. ) 25 tablet 2  . zaleplon (SONATA) 5 MG capsule Take 5 mg by mouth at bedtime as needed for sleep.    Marland Kitchen losartan (COZAAR) 50 MG tablet Take 1 tablet (50 mg total) by mouth daily. 90 tablet 1  . metoprolol succinate (TOPROL-XL) 50 MG 24 hr tablet Take 1 tablet (50 mg total) by mouth daily. Take with or immediately following a meal. 30 tablet 3   No current facility-administered medications for this visit.    Allergies:   Patient has no known allergies.   Social History:  The  patient  reports that he quit smoking about 46 years ago. His smoking use included cigarettes. He has a 20.00 pack-year smoking history. He has never used smokeless tobacco. He reports current alcohol use of about 2.0 standard drinks of alcohol per week. He reports that he does not use drugs.   Family History:  The patient's family history includes Cancer in his sister; Clotting disorder in his mother; Heart disease in his brother; Heart failure in his father; Hypertension in his sister; Other in his mother.   ROS:  Please see the history of present illness.   Otherwise, review of systems is positive for none.   All other systems are reviewed and negative.    PHYSICAL EXAM: VS:  BP (!) 160/70   Pulse (!) 48   Ht 5\' 11"  (1.803 m)   Wt 281 lb (127.5 kg)   SpO2 96%   BMI 39.19 kg/m  , BMI Body mass index is 39.19 kg/m. GEN: Well nourished, well developed, in no acute distress  HEENT: normal  Neck: no JVD, carotid bruits, or masses Cardiac: RRR; no murmurs, rubs, or gallops,no edema  Respiratory:  clear to auscultation bilaterally, normal work of breathing GI: soft, nontender, nondistended, + BS MS: no deformity or atrophy  Skin: warm and dry Neuro:  Strength and sensation are intact Psych: euthymic mood, full affect  EKG:  EKG is ordered today. Personal review of the ekg ordered shows sinus rhythm, rate 48  Recent Labs: 09/30/2019: ALT 34; TSH 2.757 03/15/2020: BUN 18; Creatinine, Ser 1.29; Hemoglobin 14.9; Platelets 174; Potassium 4.8; Sodium 137    Lipid Panel     Component Value Date/Time   CHOL 96 (L) 04/09/2018 0959   TRIG 77 04/09/2018 0959   HDL 31 (L) 04/09/2018 0959   CHOLHDL 3.1 04/09/2018 0959   LDLCALC 50 04/09/2018 0959     Wt Readings from Last 3 Encounters:  03/27/20 281 lb (127.5 kg)  03/19/20 270 lb (122.5 kg)  03/02/20 276 lb 9.6 oz (125.5 kg)      Other studies Reviewed: Additional studies/ records that were reviewed today include: TTE 09/2015 - Left ventricle: The cavity size was normal. Wall thickness was   increased in a pattern of moderate LVH. Systolic function was   normal. The estimated ejection fraction was in the range of 55%   to 60%. Indeterminant diastolic function. Wall motion was normal;   there were no regional wall motion abnormalities. - Aortic valve: There was no stenosis. There was trivial   regurgitation. - Mitral valve: Mildly calcified annulus. There was mild   regurgitation. - Left atrium: The atrium was mildly dilated. - Right ventricle: The cavity size was normal. Systolic function   was normal. - Right atrium: The atrium was mildly dilated. - Tricuspid valve: Peak  RV-RA gradient (S): 26 mm Hg. - Pulmonary arteries: PA peak pressure: 29 mm Hg (S). - Inferior vena cava: The vessel was normal in size. The   respirophasic diameter changes were in the normal range (= 50%),   consistent with normal central venous pressure.  LHC 02/24/18  Mid RCA lesion is 80% stenosed.  Prox RCA lesion is 30% stenosed.  Ost Cx to Prox Cx lesion is 95% stenosed.  Prox LAD lesion is 50% stenosed.  Prox LAD to Mid LAD lesion is 50% stenosed.  A drug-eluting stent was successfully placed using a STENT SIERRA 3.50 X 15 MM.  Post intervention, there is a 0% residual stenosis.  A drug-eluting stent was  successfully placed using a STENT SIERRA 4.00 X 15 MM.  Post intervention, there is a 0% residual stenosis.   ASSESSMENT AND PLAN:  1.  Persistent atrial fibrillation: Currently on Eliquis and amiodarone.  Status post AF ablation 05/20/2018 with repeat ablation 09/02/2019.  He is status post cardioversion 03/19/2020.  CHA2DS2-VASc of 2.    I Tosca Pletz stop his metoprolol and start him on Toprol-XL 50 mg.  He does say that he feels at times a little groggy when his heart rates are slow.  This may improve with the once a day dosing.  If he does go back into atrial fibrillation and is without symptoms, a rate control strategy could be adopted.   2.  Hypertension: Blood pressure significantly elevated today and it is at home as well.  Tamaka Sawin increase losartan to 50 mg twice daily.  3.  Obstructive sleep apnea: CPAP compliance encouraged  4.  Morbid obesity: Diet and exercise encouraged  5.  Coronary artery disease: Currently on aspirin and Eliquis.  Status post circumflex and RCA stents.  No current chest pain.    Current medicines are reviewed at length with the patient today.   The patient does not have concerns regarding his medicines.  The following changes were made today: Stop metoprolol, start Toprol-XL, increase losartan  Labs/ tests ordered today include:  Orders  Placed This Encounter  Procedures  . EKG 12-Lead    Disposition:   FU with Kalep Full 3 months  Signed, Ariah Mower [name], MD  03/27/2020 8:57 AM     [facility] [address] [place] [address] [phone] (office) [phone] (fax)

## 2020-04-24 DIAGNOSIS — Z20822 Contact with and (suspected) exposure to covid-19: Secondary | ICD-10-CM | POA: Diagnosis not present

## 2020-07-17 ENCOUNTER — Ambulatory Visit: Payer: Medicare Other | Admitting: Cardiology

## 2020-07-17 ENCOUNTER — Other Ambulatory Visit: Payer: Self-pay

## 2020-07-17 ENCOUNTER — Encounter: Payer: Self-pay | Admitting: Cardiology

## 2020-07-17 VITALS — BP 162/84 | HR 48 | Ht 71.0 in | Wt 276.6 lb

## 2020-07-17 DIAGNOSIS — I4819 Other persistent atrial fibrillation: Secondary | ICD-10-CM

## 2020-07-17 MED ORDER — AMLODIPINE BESYLATE 5 MG PO TABS: 5.0000 mg | ORAL_TABLET | Freq: Every day | ORAL | 6 refills | Status: DC

## 2020-07-17 MED ORDER — METOPROLOL SUCCINATE ER 25 MG PO TB24: 25.0000 mg | ORAL_TABLET | Freq: Every day | ORAL | 1 refills | Status: DC

## 2020-07-17 NOTE — Patient Instructions (Addendum)
Medication Instructions:  Your physician has recommended you make the following change in your medication:  1. DECREASE Toprol to 25 mg once daily 2. START Amlodipine 5 mg once daily  *If you need a refill on your cardiac medications before your next appointment, please call your pharmacy*   Lab Work: None ordered\   Testing/Procedures: None ordered   Follow-Up: At [facility], you and your health needs are our priority.  As part of our continuing mission to provide you with exceptional heart care, we have created designated Provider Care Teams.  These Care Teams include your primary Cardiologist (physician) and Advanced Practice Providers (APPs -  Physician Assistants and Nurse Practitioners) who all work together to provide you with the care you need, when you need it.  Your next appointment:   6 month(s)  The format for your next appointment:   In Person  Provider:   Loman Brooklyn, MD    Thank you for choosing [facility]!!   Dory Horn, RN [phone]   Other Instructions  Amlodipine Tablets What is this medicine? AMLODIPINE (am LOE di peen) is a calcium channel blocker. It relaxes your blood vessels and decreases the amount of work the heart has to do. It treats high blood pressure and/or prevents chest pain (also called angina). This medicine may be used for other purposes; ask your health care provider or pharmacist if you have questions. COMMON BRAND NAME(S): Norvasc What should I tell my health care provider before I take this medicine? They need to know if you have any of these conditions:  heart disease  liver disease  an unusual or allergic reaction to amlodipine, other drugs, foods, dyes, or preservatives  pregnant or trying to get pregnant  breast-feeding How should I use this medicine? Take this medicine by mouth. Take it as directed on the prescription label at the same time every day. You can take it with or without food. If it  upsets your stomach, take it with food. Keep taking it unless your health care provider tells you to stop. Talk to your health care provider about the use of this medicine in children. While it may be prescribed for children as young as 6 for selected conditions, precautions do apply. Overdosage: If you think you have taken too much of this medicine contact a poison control center or emergency room at once. NOTE: This medicine is only for you. Do not share this medicine with others. What if I miss a dose? If you miss a dose, take it as soon as you can. If it is almost time for your next dose, take only that dose. Do not take double or extra doses. What may interact with this medicine? This medicine may interact with the following medications:  clarithromycin  cyclosporine  diltiazem  itraconazole  simvastatin  tacrolimus This list may not describe all possible interactions. Give your health care provider a list of all the medicines, herbs, non-prescription drugs, or dietary supplements you use. Also tell them if you smoke, drink alcohol, or use illegal drugs. Some items may interact with your medicine. What should I watch for while using this medicine? Visit your health care provider for regular checks on your progress. Check your blood pressure as directed. Ask your health care provider what your blood pressure should be. Also, find out when you should contact him or her. Do not treat yourself for coughs, colds, or pain while you are using this medicine without asking your health care provider for  advice. Some medicines may increase your blood pressure. You may get drowsy or dizzy. Do not drive, use machinery, or do anything that needs mental alertness until you know how this medicine affects you. Do not stand up or sit up quickly, especially if you are an older patient. This reduces the risk of dizzy or fainting spells. Alcohol can make you more drowsy and dizzy. Avoid alcoholic  drinks. What side effects may I notice from receiving this medicine? Side effects that you should report to your doctor or health care provider as soon as possible:  allergic reactions (skin rash, itching or hives; swelling of the face, lips, or tongue)  heart attack (trouble breathing; pain or tightness in the chest, neck, back or arms; unusually weak or tired)  low blood pressure (dizziness; feeling faint or lightheaded, falls; unusually weak or tired) Side effects that usually do not require medical attention (report these to your doctor or health care provider if they continue or are bothersome):  facial flushing  nausea  palpitations  stomach pain  sudden weight gain  swelling of the ankles, feet, hands This list may not describe all possible side effects. Call your doctor for medical advice about side effects. You may report side effects to FDA at 1-800-FDA-1088. Where should I keep my medicine? Keep out of the reach of children and pets. Store at room temperature between 20 and 25 degrees C (68 and 77 degrees F). Protect from light and moisture. Keep the container tightly closed. Get rid of any unused medicine after the expiration date. To get rid of medicines that are no longer needed or have expired:  Take the medicine to a medicine take-back program. Check with your pharmacy or law enforcement to find a location.  If you cannot return the medicine, check the label or package insert to see if the medicine should be thrown out in the garbage or flushed down the toilet. If you are not sure, ask your health care provider. If it is safe to put in the trash, empty the medicine out of the container. Mix the medicine with cat litter, dirt, coffee grounds, or other unwanted substance. Seal the mixture in a bag or container. Put it in the trash. NOTE: This sheet is a summary. It may not cover all possible information. If you have questions about this medicine, talk to your doctor,  pharmacist, or health care provider.  2021 Elsevier/Gold Standard (2020-03-10 14:59:47)

## 2020-07-17 NOTE — Progress Notes (Signed)
Electrophysiology Office Note   Date:  07/17/2020   ID:  Ronald Hull, DOB 11-21-1945, MRN [id number]  PCP:  Renford Dills, MD   Primary Electrophysiologist:  Carole Deere [name], MD    No chief complaint on file.    History of Present Illness: Ronald Hull is a 75 y.o. male who presents today for electrophysiology evaluation.     He has a history of atrial fibrillation.  He presented to his primary physician 10/02/2015 with elevated and irregular heart rate.  He had a cardioversion at that time.  He is initially placed on amiodarone but was switched to Multaq.  He went back into atrial fibrillation and had a cardioversion 10/12/2017.  He was initially scheduled for ablation but CT scan showed significant coronary artery disease and he is now status post DES to the circumflex and RCA.  He is status post AF ablation January 2020 with repeat ablation 09/12/2019.  Today, denies symptoms of palpitations, chest pain, shortness of breath, orthopnea, PND, lower extremity edema, claudication, dizziness, presyncope, syncope, bleeding, or neurologic sequela. The patient is tolerating medications without difficulties.  Over the past few months, he has been feeling weak and fatigued.  He has been checking his heart rate and blood pressure at home.  As heart rate has been in the 40s and his blood pressure is averaging between 140 and 150 systolic.  At times he does note his blood pressures are in the 160s.  Past Medical History:  Diagnosis Date  . Arthritis   . Bronchitis 05/23/2018  . CAD (coronary artery disease) 02/24/2018   LHC 10/19: pLAD 50, mLAD 50 (dFFR=0.89), oLCx 95, pRCA 30, mRCA 80 >> PCI: DES to LCx; DES to RCA // [id number] Trial participant (Shanitha Twining DC Plavix after 30 d and remain on ASA + Apixaban)  . Chest pain secondary to ablation, with expected mild elevation in troponin 05/22/2018  . Complication of anesthesia    BP dropped post colonoscopy  . Hypercholesteremia   .  Hypertension   . Lung nodules 02/25/2019   Chest CT 01/2019: LUL nodule unchanged (benign); new LUL nodules 6 mm, aortic atherosclerosis, emphysema, partially calcified pleural plaques suggesting prior empyema or hemothorax  . Persistent atrial fibrillation (HCC)   . Sleep apnea    does use a cpap  . Wears glasses    Past Surgical History:  Procedure Laterality Date  . ATRIAL FIBRILLATION ABLATION N/A 05/20/2018   Procedure: ATRIAL FIBRILLATION ABLATION;  Surgeon: Regan Lemming, MD;  Location: [facility] INVASIVE CV LAB;  Service: Cardiovascular;  Laterality: N/A;  . ATRIAL FIBRILLATION ABLATION N/A 09/02/2019   Procedure: ATRIAL FIBRILLATION ABLATION;  Surgeon: Regan Lemming, MD;  Location: [facility] INVASIVE CV LAB;  Service: Cardiovascular;  Laterality: N/A;  . CARDIOVERSION N/A 11/01/2015   Procedure: CARDIOVERSION;  Surgeon: [name], MD;  Location: [facility] ENDOSCOPY;  Service: Cardiovascular;  Laterality: N/A;  . CARDIOVERSION N/A 11/23/2015   Procedure: CARDIOVERSION;  Surgeon: Briley Sulton [name], MD;  Location: [facility] ENDOSCOPY;  Service: Cardiovascular;  Laterality: N/A;  . CARDIOVERSION N/A 10/12/2017   Procedure: CARDIOVERSION;  Surgeon: [name], MD;  Location: [facility] ENDOSCOPY;  Service: Cardiovascular;  Laterality: N/A;  . CARDIOVERSION N/A 06/30/2018   Procedure: CARDIOVERSION;  Surgeon: Jake Bathe, MD;  Location: [facility] ENDOSCOPY;  Service: Cardiovascular;  Laterality: N/A;  . CARDIOVERSION N/A 09/22/2018   Procedure: CARDIOVERSION;  Surgeon: Quintella Reichert, MD;  Location: [facility] ENDOSCOPY;  Service: Cardiovascular;  Laterality: N/A;  . CARDIOVERSION  N/A 07/15/2019   Procedure: CARDIOVERSION;  Surgeon: Jodelle Red, MD;  Location: [facility] ENDOSCOPY;  Service: Cardiovascular;  Laterality: N/A;  . CARDIOVERSION N/A 12/07/2019   Procedure: CARDIOVERSION;  Surgeon: Chilton Si, MD;  Location: [facility] ENDOSCOPY;  Service: Cardiovascular;  Laterality: N/A;  . CARDIOVERSION N/A  03/19/2020   Procedure: CARDIOVERSION;  Surgeon: Quintella Reichert, MD;  Location: [facility] ENDOSCOPY;  Service: Cardiovascular;  Laterality: N/A;  . COLONOSCOPY    . CORONARY STENT INTERVENTION N/A 02/24/2018   Procedure: CORONARY STENT INTERVENTION;  Surgeon: Tonny Bollman, MD;  Location: [facility] INVASIVE CV LAB;  Service: Cardiovascular;  Laterality: N/A;  . I & D EXTREMITY Left 10/19/2019   Procedure: IRRIGATION AND DEBRIDEMENT LEFT SMALL PROXIMAL PHALANX;  Surgeon: Bradly Bienenstock, MD;  Location: [facility];  Service: Orthopedics;  Laterality: Left;  . INTRAVASCULAR PRESSURE WIRE/FFR STUDY N/A 02/24/2018   Procedure: INTRAVASCULAR PRESSURE WIRE/FFR STUDY;  Surgeon: Tonny Bollman, MD;  Location: [facility] INVASIVE CV LAB;  Service: Cardiovascular;  Laterality: N/A;  . KNEE ARTHROSCOPY  2000  . LEFT HEART CATH AND CORONARY ANGIOGRAPHY N/A 02/24/2018   Procedure: LEFT HEART CATH AND CORONARY ANGIOGRAPHY;  Surgeon: Tonny Bollman, MD;  Location: [facility] INVASIVE CV LAB;  Service: Cardiovascular;  Laterality: N/A;  . OPEN REDUCTION INTERNAL FIXATION (ORIF) PROXIMAL PHALANX Left 10/19/2019   Procedure: OPEN REDUCTION INTERNAL FIXATION (ORIF) PROXIMAL PHALANX WITH K-WIRES LEFT SMALL;  Surgeon: Bradly Bienenstock, MD;  Location: [facility];  Service: Orthopedics;  Laterality: Left;  . SHOULDER ARTHROSCOPY WITH ROTATOR CUFF REPAIR AND SUBACROMIAL DECOMPRESSION Right 07/15/2012   Procedure: RIGHT ARTHROSCOPY SHOULDER DECOMPRESSION  SUBACROMIAL PARTIAL ACROMIOPLASTY WITH CORACOACROMIAL RELEASE,  DISTAL CLAVICULECTOMY, resect biceps debride labrium WITH ROTATOR CUFF REPAIR;  Surgeon: [name], MD;  Location: [facility];  Service: Orthopedics;  Laterality: Right;     Current Outpatient Medications  Medication Sig Dispense Refill  . acetaminophen (TYLENOL) 325 MG tablet Take 325 mg by mouth every 6 (six) hours as needed (for pain.).     Marland Kitchen amiodarone (PACERONE) 200 MG tablet Take 1  tablet (200 mg total) by mouth 2 (two) times daily. 90 tablet 3  . amLODipine (NORVASC) 5 MG tablet Take 1 tablet (5 mg total) by mouth daily. 30 tablet 6  . apixaban (ELIQUIS) 5 MG TABS tablet Take 1 tablet (5 mg total) by mouth 2 (two) times daily.    . ASPIRIN LOW DOSE 81 MG chewable tablet Chew 81 mg by mouth every evening.   0  . atorvastatin (LIPITOR) 40 MG tablet TAKE 1 TABLET BY MOUTH DAILY 90 tablet 2  . furosemide (LASIX) 20 MG tablet Take 1-2 tablets (20-40 mg total) by mouth daily as needed for fluid.    Marland Kitchen losartan (COZAAR) 50 MG tablet Take 1 tablet (50 mg total) by mouth in the morning and at bedtime. 180 tablet 1  . metoprolol succinate (TOPROL-XL) 25 MG 24 hr tablet Take 1 tablet (25 mg total) by mouth daily. Take with or immediately following a meal. 90 tablet 1  . nitroGLYCERIN (NITROSTAT) 0.4 MG SL tablet PLACE ONE TABLET UNDER TONGUE AS NEEDED FOR CHEST PAIN EVERY 5 MINUTES AS NEEDED 25 tablet 2  . zaleplon (SONATA) 5 MG capsule Take 5 mg by mouth at bedtime as needed for sleep.     No current facility-administered medications for this visit.    Allergies:   Patient has no known allergies.   Social History:  The patient  reports that he quit  smoking about 47 years ago. His smoking use included cigarettes. He has a 20.00 pack-year smoking history. He has never used smokeless tobacco. He reports current alcohol use of about 2.0 standard drinks of alcohol per week. He reports that he does not use drugs.   Family History:  The patient's family history includes Cancer in his sister; Clotting disorder in his mother; Heart disease in his brother; Heart failure in his father; Hypertension in his sister; Other in his mother.   ROS:  Please see the history of present illness.   Otherwise, review of systems is positive for none.   All other systems are reviewed and negative.   PHYSICAL EXAM: VS:  BP (!) 162/84   Pulse (!) 48   Ht 5\' 11"  (1.803 m)   Wt 276 lb 9.6 oz (125.5 kg)    SpO2 97%   BMI 38.58 kg/m  , BMI Body mass index is 38.58 kg/m. GEN: Well nourished, well developed, in no acute distress  HEENT: normal  Neck: no JVD, carotid bruits, or masses Cardiac: RRR; no murmurs, rubs, or gallops,no edema  Respiratory:  clear to auscultation bilaterally, normal work of breathing GI: soft, nontender, nondistended, + BS MS: no deformity or atrophy  Skin: warm and dry Neuro:  Strength and sensation are intact Psych: euthymic mood, full affect  EKG:  EKG is ordered today. Personal review of the ekg ordered shows sinus rhythm, rate 48, first-degree AV block  Recent Labs: 09/30/2019: ALT 34; TSH 2.757 03/15/2020: BUN 18; Creatinine, Ser 1.29; Hemoglobin 14.9; Platelets 174; Potassium 4.8; Sodium 137    Lipid Panel     Component Value Date/Time   CHOL 96 (L) 04/09/2018 0959   TRIG 77 04/09/2018 0959   HDL 31 (L) 04/09/2018 0959   CHOLHDL 3.1 04/09/2018 0959   LDLCALC 50 04/09/2018 0959     Wt Readings from Last 3 Encounters:  07/17/20 276 lb 9.6 oz (125.5 kg)  03/27/20 281 lb (127.5 kg)  03/19/20 270 lb (122.5 kg)      Other studies Reviewed: Additional studies/ records that were reviewed today include: TTE 09/2015 - Left ventricle: The cavity size was normal. Wall thickness was   increased in a pattern of moderate LVH. Systolic function was   normal. The estimated ejection fraction was in the range of 55%   to 60%. Indeterminant diastolic function. Wall motion was normal;   there were no regional wall motion abnormalities. - Aortic valve: There was no stenosis. There was trivial   regurgitation. - Mitral valve: Mildly calcified annulus. There was mild   regurgitation. - Left atrium: The atrium was mildly dilated. - Right ventricle: The cavity size was normal. Systolic function   was normal. - Right atrium: The atrium was mildly dilated. - Tricuspid valve: Peak RV-RA gradient (S): 26 mm Hg. - Pulmonary arteries: PA peak pressure: 29 mm Hg  (S). - Inferior vena cava: The vessel was normal in size. The   respirophasic diameter changes were in the normal range (= 50%),   consistent with normal central venous pressure.  LHC 02/24/18  Mid RCA lesion is 80% stenosed.  Prox RCA lesion is 30% stenosed.  Ost Cx to Prox Cx lesion is 95% stenosed.  Prox LAD lesion is 50% stenosed.  Prox LAD to Mid LAD lesion is 50% stenosed.  A drug-eluting stent was successfully placed using a STENT SIERRA 3.50 X 15 MM.  Post intervention, there is a 0% residual stenosis.  A drug-eluting stent was successfully  placed using a STENT SIERRA 4.00 X 15 MM.  Post intervention, there is a 0% residual stenosis.   ASSESSMENT AND PLAN:  1.  Persistent atrial fibrillation: Currently on Eliquis and amiodarone.  High risk medication monitoring.  Status post ablation 05/20/2018 with repeat ablation 03/19/2020.  CHA2DS2-VASc of 2.  He has remained in sinus rhythm.  We Shamica Moree continue current management.  At the next visit, we Latavion Halls discuss decreasing amiodarone to once a day.  2.  Hypertension: Elevated today.  He is having some fatigue.  We Keller Mikels decrease his Toprol-XL to 25 mg and start him on Norvasc.  3.  Obstructive sleep apnea: CPAP compliance encouraged  4.  Morbid obesity: Diet and exercise encouraged  5.  Coronary artery disease: Currently on aspirin and Eliquis.  Status post circumflex and RCA stents.  No current chest pain.     Current medicines are reviewed at length with the patient today.   The patient does not have concerns regarding his medicines.  The following changes were made today: Start Norvasc, decrease Toprol-XL  Labs/ tests ordered today include:  Orders Placed This Encounter  Procedures  . EKG 12-Lead    Disposition:   FU with Piper Hassebrock 6 months  Signed, Rodrickus Min [name], MD  07/17/2020 9:05 AM     [facility] [address] [place] [address] [phone] (office) [phone]  (fax)

## 2020-07-23 DIAGNOSIS — R059 Cough, unspecified: Secondary | ICD-10-CM | POA: Diagnosis not present

## 2020-07-23 DIAGNOSIS — Z20822 Contact with and (suspected) exposure to covid-19: Secondary | ICD-10-CM | POA: Diagnosis not present

## 2020-07-23 DIAGNOSIS — J069 Acute upper respiratory infection, unspecified: Secondary | ICD-10-CM | POA: Diagnosis not present

## 2020-07-23 DIAGNOSIS — U071 COVID-19: Secondary | ICD-10-CM | POA: Diagnosis not present

## 2020-08-29 ENCOUNTER — Other Ambulatory Visit: Payer: Self-pay | Admitting: Cardiology

## 2020-08-29 NOTE — Telephone Encounter (Signed)
Pt last saw Dr Elberta Fortis 07/17/20, last labs 03/15/20 Creat 1.29, age 75, weight 125.5kg, based on specified criteria pt is on appropriate dosage of Eliquis 5mg  BID.  Will refill rx.

## 2020-10-11 ENCOUNTER — Other Ambulatory Visit: Payer: Self-pay | Admitting: Cardiology

## 2020-11-07 ENCOUNTER — Other Ambulatory Visit: Payer: Self-pay | Admitting: Cardiovascular Disease

## 2020-11-30 DIAGNOSIS — R600 Localized edema: Secondary | ICD-10-CM | POA: Diagnosis not present

## 2020-11-30 DIAGNOSIS — R200 Anesthesia of skin: Secondary | ICD-10-CM | POA: Diagnosis not present

## 2020-12-05 ENCOUNTER — Other Ambulatory Visit: Payer: Self-pay | Admitting: Cardiology

## 2020-12-19 DIAGNOSIS — G629 Polyneuropathy, unspecified: Secondary | ICD-10-CM | POA: Diagnosis not present

## 2021-01-01 ENCOUNTER — Encounter: Payer: Self-pay | Admitting: Neurology

## 2021-01-08 ENCOUNTER — Other Ambulatory Visit: Payer: Self-pay

## 2021-01-08 ENCOUNTER — Encounter: Payer: Self-pay | Admitting: Cardiology

## 2021-01-08 ENCOUNTER — Ambulatory Visit: Payer: Medicare Other | Admitting: Cardiology

## 2021-01-08 VITALS — BP 150/82 | HR 51 | Ht 71.0 in | Wt 271.0 lb

## 2021-01-08 DIAGNOSIS — Z79899 Other long term (current) drug therapy: Secondary | ICD-10-CM

## 2021-01-08 DIAGNOSIS — I4819 Other persistent atrial fibrillation: Secondary | ICD-10-CM | POA: Diagnosis not present

## 2021-01-08 MED ORDER — LOSARTAN POTASSIUM 100 MG PO TABS: 100.0000 mg | ORAL_TABLET | Freq: Every day | ORAL | 2 refills | Status: DC

## 2021-01-08 NOTE — Patient Instructions (Signed)
Medication Instructions:  Your physician has recommended you make the following change in your medication: STOP Amlodipine (Norvasc) INCREASE Losartan to 100 mg daily  *If you need a refill on your cardiac medications before your next appointment, please call your pharmacy*   Lab Work: Your physician recommends that you return for lab work in: 2 weeks for CMET, TSH and CBC  If you have labs (blood work) drawn today and your tests are completely normal, you will receive your results only by: MyChart Message (if you have MyChart) OR A paper copy in the mail If you have any lab test that is abnormal or we need to change your treatment, we will call you to review the results.   Testing/Procedures: None ordered   Follow-Up: At [facility], you and your health needs are our priority.  As part of our continuing mission to provide you with exceptional heart care, we have created designated Provider Care Teams.  These Care Teams include your primary Cardiologist (physician) and Advanced Practice Providers (APPs -  Physician Assistants and Nurse Practitioners) who all work together to provide you with the care you need, when you need it.   Your next appointment:   6 month(s)  The format for your next appointment:   In Person  Provider:   Loman Brooklyn, MD    Thank you for choosing [facility]!!   Dory Horn, RN [phone]

## 2021-01-08 NOTE — Progress Notes (Signed)
Electrophysiology Office Note   Date:  01/08/2021   ID:  Ronald Hull, DOB 09-18-1945, MRN [id number]  PCP:  Seward Carol, MD   Primary Electrophysiologist:  Constance Haw, MD    No chief complaint on file.    History of Present Illness: Ronald Hull is a 75 y.o. male who presents today for electrophysiology evaluation.     He has a history significant for atrial fibrillation.  He presented to his primary physician in 2017 with elevated and irregular heart rates.  He had a cardioversion.  He was initially started on amiodarone but switched to Multaq.  He went back into atrial fibrillation and had a cardioversion in 2019.  He was initially scheduled for ablation, though CT scan showed significant coronary artery disease and is now status post DES to the RCA.  He is status post atrial fibrillation ablation January 2020 with repeat ablation 09/12/2019.  He continued to have episodes of atrial fibrillation and is now on amiodarone.  Today, denies symptoms of palpitations, chest pain, shortness of breath, orthopnea, PND, lower extremity edema, claudication, dizziness, presyncope, syncope, bleeding, or neurologic sequela. The patient is tolerating medications without difficulties.  Since last being seen he has done well.  He has had no chest pain or shortness of breath.  His activities and is without restriction.  He has noted no further episodes of atrial fibrillation over the last 10 or so months.  He is concerned, though, that he has a peripheral neuropathy.  His neuropathy was diagnosed a couple weeks ago.  He was concerned that some of his cardiac medications could be contributing.  He was also having some lower extremity edema on his Norvasc.  He decreased his dose to 2.5 mg daily.  Past Medical History:  Diagnosis Date   Arthritis    Bronchitis 05/23/2018   CAD (coronary artery disease) 02/24/2018   LHC 10/19: pLAD 50, mLAD 50 (dFFR=0.89), oLCx 95, pRCA 30, mRCA 80 >> PCI:  DES to LCx; DES to RCA // Xience28 Trial participant (Dannelle Rhymes DC Plavix after 30 d and remain on ASA + Apixaban)   Chest pain secondary to ablation, with expected mild elevation in troponin 9/35/5217   Complication of anesthesia    BP dropped post colonoscopy   Hypercholesteremia    Hypertension    Lung nodules 02/25/2019   Chest CT 01/2019: LUL nodule unchanged (benign); new LUL nodules 6 mm, aortic atherosclerosis, emphysema, partially calcified pleural plaques suggesting prior empyema or hemothorax   Persistent atrial fibrillation (HCC)    Sleep apnea    does use a cpap   Wears glasses    Past Surgical History:  Procedure Laterality Date   ATRIAL FIBRILLATION ABLATION N/A 05/20/2018   Procedure: ATRIAL FIBRILLATION ABLATION;  Surgeon: Constance Haw, MD;  Location: [place] [facility];  Service: Cardiovascular;  Laterality: N/A;   ATRIAL FIBRILLATION ABLATION N/A 09/02/2019   Procedure: ATRIAL FIBRILLATION ABLATION;  Surgeon: Constance Haw, MD;  Location: [place] [facility];  Service: Cardiovascular;  Laterality: N/A;   CARDIOVERSION N/A 11/01/2015   Procedure: CARDIOVERSION;  Surgeon: Pixie Casino, MD;  Location: [facility] ENDOSCOPY;  Service: Cardiovascular;  Laterality: N/A;   CARDIOVERSION N/A 11/23/2015   Procedure: CARDIOVERSION;  Surgeon: Anaisha Mago Meredith Leeds, MD;  Location: [place];  Service: Cardiovascular;  Laterality: N/A;   CARDIOVERSION N/A 10/12/2017   Procedure: CARDIOVERSION;  Surgeon: Pixie Casino, MD;  Location: [place];  Service: Cardiovascular;  Laterality: N/A;   CARDIOVERSION  N/A 06/30/2018   Procedure: CARDIOVERSION;  Surgeon: Jerline Pain, MD;  Location: [place];  Service: Cardiovascular;  Laterality: N/A;   CARDIOVERSION N/A 09/22/2018   Procedure: CARDIOVERSION;  Surgeon: Sueanne Margarita, MD;  Location: [facility] ENDOSCOPY;  Service: Cardiovascular;  Laterality: N/A;   CARDIOVERSION N/A 07/15/2019   Procedure: CARDIOVERSION;  Surgeon: Buford Dresser, MD;  Location: [facility] ENDOSCOPY;  Service: Cardiovascular;  Laterality: N/A;   CARDIOVERSION N/A 12/07/2019   Procedure: CARDIOVERSION;  Surgeon: Skeet Latch, MD;  Location: [place];  Service: Cardiovascular;  Laterality: N/A;   CARDIOVERSION N/A 03/19/2020   Procedure: CARDIOVERSION;  Surgeon: Sueanne Margarita, MD;  Location: [facility] ENDOSCOPY;  Service: Cardiovascular;  Laterality: N/A;   COLONOSCOPY     CORONARY STENT INTERVENTION N/A 02/24/2018   Procedure: CORONARY STENT INTERVENTION;  Surgeon: Sherren Mocha, MD;  Location: [place] [facility];  Service: Cardiovascular;  Laterality: N/A;   I & D EXTREMITY Left 10/19/2019   Procedure: IRRIGATION AND DEBRIDEMENT LEFT SMALL PROXIMAL PHALANX;  Surgeon: Iran Planas, MD;  Location: [place];  Service: Orthopedics;  Laterality: Left;   INTRAVASCULAR PRESSURE WIRE/FFR STUDY N/A 02/24/2018   Procedure: INTRAVASCULAR PRESSURE WIRE/FFR STUDY;  Surgeon: Sherren Mocha, MD;  Location: [place] [facility];  Service: Cardiovascular;  Laterality: N/A;   KNEE ARTHROSCOPY  2000   LEFT HEART CATH AND CORONARY ANGIOGRAPHY N/A 02/24/2018   Procedure: LEFT HEART CATH AND CORONARY ANGIOGRAPHY;  Surgeon: Sherren Mocha, MD;  Location: [place] [facility];  Service: Cardiovascular;  Laterality: N/A;   OPEN REDUCTION INTERNAL FIXATION (ORIF) PROXIMAL PHALANX Left 10/19/2019   Procedure: OPEN REDUCTION INTERNAL FIXATION (ORIF) PROXIMAL PHALANX WITH K-WIRES LEFT SMALL;  Surgeon: Iran Planas, MD;  Location: [place];  Service: Orthopedics;  Laterality: Left;   SHOULDER ARTHROSCOPY WITH ROTATOR CUFF REPAIR AND SUBACROMIAL DECOMPRESSION Right 07/15/2012   Procedure: RIGHT ARTHROSCOPY SHOULDER DECOMPRESSION  SUBACROMIAL PARTIAL ACROMIOPLASTY WITH CORACOACROMIAL RELEASE,  DISTAL CLAVICULECTOMY, resect biceps debride labrium WITH ROTATOR CUFF REPAIR;  Surgeon: Ninetta Lights, MD;  Location: [place];  Service:  Orthopedics;  Laterality: Right;     Current Outpatient Medications  Medication Sig Dispense Refill   acetaminophen (TYLENOL) 325 MG tablet Take 325 mg by mouth every 6 (six) hours as needed (for pain.).      amiodarone (PACERONE) 200 MG tablet TAKE 1 TABLET(200 MG) BY MOUTH DAILY 90 tablet 2   ASPIRIN LOW DOSE 81 MG chewable tablet Chew 81 mg by mouth every evening.   0   atorvastatin (LIPITOR) 40 MG tablet Take 1 tablet (40 mg total) by mouth daily. Please make overdue appt with Dr. Burt Knack before anymore refills. Thank you 3rd and Final Attempt 15 tablet 0   ELIQUIS 5 MG TABS tablet TAKE 1 TABLET(5 MG) BY MOUTH TWICE DAILY 60 tablet 5   furosemide (LASIX) 20 MG tablet Take 1-2 tablets (20-40 mg total) by mouth daily as needed for fluid.     losartan (COZAAR) 100 MG tablet Take 1 tablet (100 mg total) by mouth daily. 90 tablet 2   nitroGLYCERIN (NITROSTAT) 0.4 MG SL tablet PLACE ONE TABLET UNDER TONGUE AS NEEDED FOR CHEST PAIN EVERY 5 MINUTES AS NEEDED 25 tablet 2   zaleplon (SONATA) 5 MG capsule Take 5 mg by mouth at bedtime as needed for sleep.     metoprolol succinate (TOPROL-XL) 25 MG 24 hr tablet Take 1 tablet (25 mg total) by mouth daily. Take with or immediately following a  meal. 90 tablet 1   No current facility-administered medications for this visit.    Allergies:   Patient has no known allergies.   Social History:  The patient  reports that he quit smoking about 47 years ago. His smoking use included cigarettes. He has a 20.00 pack-year smoking history. He has never used smokeless tobacco. He reports current alcohol use of about 2.0 standard drinks per week. He reports that he does not use drugs.   Family History:  The patient's family history includes Cancer in his sister; Clotting disorder in his mother; Heart disease in his brother; Heart failure in his father; Hypertension in his sister; Other in his mother.   ROS:  Please see the history of present illness.   Otherwise,  review of systems is positive for none.   All other systems are reviewed and negative.   PHYSICAL EXAM: VS:  BP (!) 150/82   Pulse (!) 51   Ht '5\' 11"'  (1.803 m)   Wt 271 lb (122.9 kg)   SpO2 97%   BMI 37.80 kg/m  , BMI Body mass index is 37.8 kg/m. GEN: Well nourished, well developed, in no acute distress  HEENT: normal  Neck: no JVD, carotid bruits, or masses Cardiac: RRR; no murmurs, rubs, or gallops,no edema  Respiratory:  clear to auscultation bilaterally, normal work of breathing GI: soft, nontender, nondistended, + BS MS: no deformity or atrophy  Skin: warm and dry Neuro:  Strength and sensation are intact Psych: euthymic mood, full affect  EKG:  EKG is ordered today. Personal review of the ekg ordered shows sinus rhythm, rate 51, first-degree AV block  Recent Labs: 03/15/2020: BUN 18; Creatinine, Ser 1.29; Hemoglobin 14.9; Platelets 174; Potassium 4.8; Sodium 137    Lipid Panel     Component Value Date/Time   CHOL 96 (L) 04/09/2018 0959   TRIG 77 04/09/2018 0959   HDL 31 (L) 04/09/2018 0959   CHOLHDL 3.1 04/09/2018 0959   LDLCALC 50 04/09/2018 0959     Wt Readings from Last 3 Encounters:  01/08/21 271 lb (122.9 kg)  07/17/20 276 lb 9.6 oz (125.5 kg)  03/27/20 281 lb (127.5 kg)      Other studies Reviewed: Additional studies/ records that were reviewed today include: TTE 09/2015 - Left ventricle: The cavity size was normal. Wall thickness was   increased in a pattern of moderate LVH. Systolic function was   normal. The estimated ejection fraction was in the range of 55%   to 60%. Indeterminant diastolic function. Wall motion was normal;   there were no regional wall motion abnormalities. - Aortic valve: There was no stenosis. There was trivial   regurgitation. - Mitral valve: Mildly calcified annulus. There was mild   regurgitation. - Left atrium: The atrium was mildly dilated. - Right ventricle: The cavity size was normal. Systolic function   was  normal. - Right atrium: The atrium was mildly dilated. - Tricuspid valve: Peak RV-RA gradient (S): 26 mm Hg. - Pulmonary arteries: PA peak pressure: 29 mm Hg (S). - Inferior vena cava: The vessel was normal in size. The   respirophasic diameter changes were in the normal range (= 50%),   consistent with normal central venous pressure.  LHC 02/24/18 Mid RCA lesion is 80% stenosed. Prox RCA lesion is 30% stenosed. Ost Cx to Prox Cx lesion is 95% stenosed. Prox LAD lesion is 50% stenosed. Prox LAD to Mid LAD lesion is 50% stenosed. A drug-eluting stent was successfully placed using a  STENT SIERRA 3.50 X 15 MM. Post intervention, there is a 0% residual stenosis. A drug-eluting stent was successfully placed using a STENT SIERRA 4.00 X 15 MM. Post intervention, there is a 0% residual stenosis.   ASSESSMENT AND PLAN:  1.  Persistent atrial fibrillation: Currently on Eliquis 5 mg twice daily, amiodarone 200 mg daily.  Is status post ablation 05/20/2018 with repeat ablation 03/19/2020.  CHA2DS2-VASc of 3.  He is having peripheral neuropathy.  Due to that, he would like to reduce his dose of amiodarone to see if this Jayel Inks have an effect.  We Germain Koopmann reduce to 100 mg daily.  We Angelmarie Ponzo TSH and LFTs in 2 weeks.  2.  Hypertension: Elevated today.  He decreased his Norvasc as he was having lower extremity edema.  We Jazelyn Sipe increase his losartan to 100 mg.  We Sejla Marzano check a basic metabolic in 2 weeks.  3.  Obstructive sleep apnea: CPAP compliance encouraged  4.  Morbid obesity: Diet and exercise encouraged  5.  Coronary artery disease: Currently on Eliquis 5 mg and aspirin 81 mg.  No current chest pain.   Current medicines are reviewed at length with the patient today.   The patient does not have concerns regarding his medicines.  The following changes were made today: Decrease amiodarone  Labs/ tests ordered today include:  Orders Placed This Encounter  Procedures   Comp Met (CMET)   TSH   CBC    EKG 12-Lead     Disposition:   FU with Isiac Breighner 6 months  Signed, Mylah Baynes Meredith Leeds, MD  01/08/2021 9:20 AM     [facility] [address] [place] [address] [phone] (office) [phone] (fax)

## 2021-01-10 ENCOUNTER — Other Ambulatory Visit: Payer: Self-pay | Admitting: Cardiology

## 2021-01-17 DIAGNOSIS — G4733 Obstructive sleep apnea (adult) (pediatric): Secondary | ICD-10-CM | POA: Diagnosis not present

## 2021-01-22 ENCOUNTER — Other Ambulatory Visit: Payer: Self-pay

## 2021-01-22 ENCOUNTER — Other Ambulatory Visit: Payer: Medicare Other

## 2021-01-22 DIAGNOSIS — Z79899 Other long term (current) drug therapy: Secondary | ICD-10-CM | POA: Diagnosis not present

## 2021-01-22 DIAGNOSIS — I4819 Other persistent atrial fibrillation: Secondary | ICD-10-CM

## 2021-01-22 LAB — CBC
Hematocrit: 43.6 % (ref 37.5–51.0)
Hemoglobin: 14.5 g/dL (ref 13.0–17.7)
MCH: 30.9 pg (ref 26.6–33.0)
MCHC: 33.3 g/dL (ref 31.5–35.7)
MCV: 93 fL (ref 79–97)
Platelets: 175 10*3/uL (ref 150–450)
RBC: 4.69 x10E6/uL (ref 4.14–5.80)
RDW: 13.1 % (ref 11.6–15.4)
WBC: 6.9 10*3/uL (ref 3.4–10.8)

## 2021-01-22 LAB — COMPREHENSIVE METABOLIC PANEL
ALT: 17 IU/L (ref 0–44)
AST: 21 IU/L (ref 0–40)
Albumin/Globulin Ratio: 1.7 (ref 1.2–2.2)
Albumin: 4.1 g/dL (ref 3.7–4.7)
Alkaline Phosphatase: 99 IU/L (ref 44–121)
BUN/Creatinine Ratio: 17 (ref 10–24)
BUN: 19 mg/dL (ref 8–27)
Bilirubin Total: 0.5 mg/dL (ref 0.0–1.2)
CO2: 22 mmol/L (ref 20–29)
Calcium: 8.8 mg/dL (ref 8.6–10.2)
Chloride: 100 mmol/L (ref 96–106)
Creatinine, Ser: 1.15 mg/dL (ref 0.76–1.27)
Globulin, Total: 2.4 g/dL (ref 1.5–4.5)
Glucose: 94 mg/dL (ref 70–99)
Potassium: 5.5 mmol/L — ABNORMAL HIGH (ref 3.5–5.2)
Sodium: 133 mmol/L — ABNORMAL LOW (ref 134–144)
Total Protein: 6.5 g/dL (ref 6.0–8.5)
eGFR: 66 mL/min/{1.73_m2} (ref >59)

## 2021-01-22 LAB — TSH: TSH: 2.63 u[IU]/mL (ref 0.450–4.500)

## 2021-01-28 ENCOUNTER — Other Ambulatory Visit: Payer: Self-pay | Admitting: *Deleted

## 2021-01-28 DIAGNOSIS — E875 Hyperkalemia: Secondary | ICD-10-CM

## 2021-01-30 DIAGNOSIS — G4733 Obstructive sleep apnea (adult) (pediatric): Secondary | ICD-10-CM | POA: Diagnosis not present

## 2021-02-04 ENCOUNTER — Other Ambulatory Visit: Payer: Medicare Other

## 2021-02-08 ENCOUNTER — Other Ambulatory Visit: Payer: Medicare Other | Admitting: *Deleted

## 2021-02-08 ENCOUNTER — Ambulatory Visit: Payer: Medicare Other | Admitting: Neurology

## 2021-02-08 ENCOUNTER — Other Ambulatory Visit: Payer: Self-pay | Admitting: Neurology

## 2021-02-08 ENCOUNTER — Other Ambulatory Visit: Payer: Self-pay

## 2021-02-08 ENCOUNTER — Encounter: Payer: Self-pay | Admitting: Neurology

## 2021-02-08 VITALS — BP 170/77 | HR 57 | Ht 71.0 in | Wt 272.0 lb

## 2021-02-08 DIAGNOSIS — R2681 Unsteadiness on feet: Secondary | ICD-10-CM

## 2021-02-08 DIAGNOSIS — G629 Polyneuropathy, unspecified: Secondary | ICD-10-CM

## 2021-02-08 DIAGNOSIS — E875 Hyperkalemia: Secondary | ICD-10-CM | POA: Diagnosis not present

## 2021-02-08 NOTE — Patient Instructions (Addendum)
Start vitamin B12 daily  Check labs.  Results will be posted to MyChart.   We will refer you to physical therapy for balance training  Start using a cane  Check your feet daily  Return to clinic in 4 months

## 2021-02-08 NOTE — Progress Notes (Signed)
[facility] Clinic Note - Initial Visit   Date: 02/08/21  Ronald Hull MRN: [id number] DOB: Dec 28, 1945   Dear Dr. Nehemiah Settle:  Thank you for your kind referral of Ronald Hull for consultation of feet numbness. Although his history is well known to you, please allow [place] to reiterate it for the purpose of our medical record. The patient was accompanied to the clinic by self.    History of Present Illness: Ronald Hull is a 75 y.o. right-handed male with CAD, atrial fibrillation, and hypertension presenting for evaluation of bilateral feet numbness.  For the past year, he began having sensation that his feet are wrapped in gauze or walking on sand.  Symptoms became more bothersome since the summer of 2022.  He denies burning or tingling.  It involves the soles and balls of the feet.  Symptoms are noticeable when he is resting or in bed.  Balance is getting worse for sometime.  He has fallen several times this year, one with injury.  He does not use a cane and has not done PT.    He lives at home with his wife in a 2-level home.  He works as a Estate manager/land agent.  He drinks 2 glasses wine per day.  He was previously drinking 4 glasses wine nightly prior to two years ago.    Vitamin B12 level was 257. He is not taking multivitamin or supplements.  NCS/EMG of the legs performed by Dr. Neale Burly  shows chronic sensorimotor axonal polyneuropathy.    Out-side paper records, electronic medical record, and images have been reviewed where available and summarized as:  NCS/EMG of the legs 12/19/2020:  Chronic sensorimotor axonal and demyelinating neuropathy.  Lab Results  Component Value Date   TSH 2.630 01/22/2021    Past Medical History:  Diagnosis Date   Arthritis    Bronchitis 05/23/2018   CAD (coronary artery disease) 02/24/2018   LHC 10/19: pLAD 50, mLAD 50 (dFFR=0.89), oLCx 95, pRCA 30, mRCA 80 >> PCI: DES to LCx; DES to RCA // Xience28 Trial  participant (will DC Plavix after 30 d and remain on ASA + Apixaban)   Chest pain secondary to ablation, with expected mild elevation in troponin 05/22/2018   Complication of anesthesia    BP dropped post colonoscopy   Hypercholesteremia    Hypertension    Lung nodules 02/25/2019   Chest CT 01/2019: LUL nodule unchanged (benign); new LUL nodules 6 mm, aortic atherosclerosis, emphysema, partially calcified pleural plaques suggesting prior empyema or hemothorax   Persistent atrial fibrillation (HCC)    Sleep apnea    does use a cpap   Wears glasses     Past Surgical History:  Procedure Laterality Date   ATRIAL FIBRILLATION ABLATION N/A 05/20/2018   Procedure: ATRIAL FIBRILLATION ABLATION;  Surgeon: Regan Lemming, MD;  Location: [facility];  Service: Cardiovascular;  Laterality: N/A;   ATRIAL FIBRILLATION ABLATION N/A 09/02/2019   Procedure: ATRIAL FIBRILLATION ABLATION;  Surgeon: Regan Lemming, MD;  Location: [facility];  Service: Cardiovascular;  Laterality: N/A;   CARDIOVERSION N/A 11/01/2015   Procedure: CARDIOVERSION;  Surgeon: [name], MD;  Location: [facility] ENDOSCOPY;  Service: Cardiovascular;  Laterality: N/A;   CARDIOVERSION N/A 11/23/2015   Procedure: CARDIOVERSION;  Surgeon: Will [name], MD;  Location: [facility] ENDOSCOPY;  Service: Cardiovascular;  Laterality: N/A;   CARDIOVERSION N/A 10/12/2017   Procedure: CARDIOVERSION;  Surgeon: [name], MD;  Location: [facility] ENDOSCOPY;  Service: Cardiovascular;  Laterality: N/A;   CARDIOVERSION N/A 06/30/2018   Procedure: CARDIOVERSION;  Surgeon: Jake Bathe, MD;  Location: [facility] ENDOSCOPY;  Service: Cardiovascular;  Laterality: N/A;   CARDIOVERSION N/A 09/22/2018   Procedure: CARDIOVERSION;  Surgeon: Quintella Reichert, MD;  Location: [facility] ENDOSCOPY;  Service: Cardiovascular;  Laterality: N/A;   CARDIOVERSION N/A 07/15/2019   Procedure: CARDIOVERSION;  Surgeon: Jodelle Red, MD;  Location: [facility] ENDOSCOPY;   Service: Cardiovascular;  Laterality: N/A;   CARDIOVERSION N/A 12/07/2019   Procedure: CARDIOVERSION;  Surgeon: Chilton Si, MD;  Location: [facility] ENDOSCOPY;  Service: Cardiovascular;  Laterality: N/A;   CARDIOVERSION N/A 03/19/2020   Procedure: CARDIOVERSION;  Surgeon: Quintella Reichert, MD;  Location: [facility] ENDOSCOPY;  Service: Cardiovascular;  Laterality: N/A;   COLONOSCOPY     CORONARY STENT INTERVENTION N/A 02/24/2018   Procedure: CORONARY STENT INTERVENTION;  Surgeon: Tonny Bollman, MD;  Location: [facility] INVASIVE CV LAB;  Service: Cardiovascular;  Laterality: N/A;   I & D EXTREMITY Left 10/19/2019   Procedure: IRRIGATION AND DEBRIDEMENT LEFT SMALL PROXIMAL PHALANX;  Surgeon: Bradly Bienenstock, MD;  Location: [facility];  Service: Orthopedics;  Laterality: Left;   INTRAVASCULAR PRESSURE WIRE/FFR STUDY N/A 02/24/2018   Procedure: INTRAVASCULAR PRESSURE WIRE/FFR STUDY;  Surgeon: Tonny Bollman, MD;  Location: [facility] INVASIVE CV LAB;  Service: Cardiovascular;  Laterality: N/A;   KNEE ARTHROSCOPY  2000   LEFT HEART CATH AND CORONARY ANGIOGRAPHY N/A 02/24/2018   Procedure: LEFT HEART CATH AND CORONARY ANGIOGRAPHY;  Surgeon: Tonny Bollman, MD;  Location: [facility] INVASIVE CV LAB;  Service: Cardiovascular;  Laterality: N/A;   OPEN REDUCTION INTERNAL FIXATION (ORIF) PROXIMAL PHALANX Left 10/19/2019   Procedure: OPEN REDUCTION INTERNAL FIXATION (ORIF) PROXIMAL PHALANX WITH K-WIRES LEFT SMALL;  Surgeon: Bradly Bienenstock, MD;  Location: [facility];  Service: Orthopedics;  Laterality: Left;   SHOULDER ARTHROSCOPY WITH ROTATOR CUFF REPAIR AND SUBACROMIAL DECOMPRESSION Right 07/15/2012   Procedure: RIGHT ARTHROSCOPY SHOULDER DECOMPRESSION  SUBACROMIAL PARTIAL ACROMIOPLASTY WITH CORACOACROMIAL RELEASE,  DISTAL CLAVICULECTOMY, resect biceps debride labrium WITH ROTATOR CUFF REPAIR;  Surgeon: [name], MD;  Location: [facility];  Service: Orthopedics;  Laterality: Right;      Medications:  Outpatient Encounter Medications as of 02/08/2021  Medication Sig   acetaminophen (TYLENOL) 325 MG tablet Take 325 mg by mouth every 6 (six) hours as needed (for pain.).    amiodarone (PACERONE) 200 MG tablet TAKE 1 TABLET(200 MG) BY MOUTH DAILY   ASPIRIN LOW DOSE 81 MG chewable tablet Chew 81 mg by mouth every evening.    atorvastatin (LIPITOR) 40 MG tablet Take 1 tablet (40 mg total) by mouth daily. Please make overdue appt with Dr. Excell Seltzer before anymore refills. Thank you 3rd and Final Attempt   ELIQUIS 5 MG TABS tablet TAKE 1 TABLET(5 MG) BY MOUTH TWICE DAILY   furosemide (LASIX) 20 MG tablet Take 1-2 tablets (20-40 mg total) by mouth daily as needed for fluid.   losartan (COZAAR) 100 MG tablet Take 1 tablet (100 mg total) by mouth daily.   metoprolol succinate (TOPROL-XL) 25 MG 24 hr tablet TAKE 1 TABLET(25 MG) BY MOUTH DAILY WITH OR IMMEDIATELY FOLLOWING A MEAL   nitroGLYCERIN (NITROSTAT) 0.4 MG SL tablet PLACE ONE TABLET UNDER TONGUE AS NEEDED FOR CHEST PAIN EVERY 5 MINUTES AS NEEDED   zaleplon (SONATA) 5 MG capsule Take 5 mg by mouth at bedtime as needed for sleep.   No facility-administered encounter medications on file as of 02/08/2021.  Allergies: No Known Allergies  Family History: Family History  Problem Relation Age of Onset   Heart failure Father    Other Mother        bad fall   Clotting disorder Mother    Cancer Sister    Hypertension Sister    Heart disease Brother     Social History: Social History   Tobacco Use   Smoking status: Former    Packs/day: 2.00    Years: 10.00    Pack years: 20.00    Types: Cigarettes    Quit date: 07/09/1973    Years since quitting: 47.6   Smokeless tobacco: Never  Vaping Use   Vaping Use: Never used  Substance Use Topics   Alcohol use: Yes    Alcohol/week: 2.0 standard drinks    Types: 2 Glasses of wine per week   Drug use: No   Social History   Social History Narrative   Right Handed     Lives in a two story home     Vital Signs:  BP (!) 170/77   Pulse (!) 57   Ht 5\' 11"  (1.803 m)   Wt 272 lb (123.4 kg)   SpO2 96%   BMI 37.94 kg/m   Neurological Exam: MENTAL STATUS including orientation to time, place, person, recent and remote memory, attention span and concentration, language, and fund of knowledge is normal.  Speech is not dysarthric.  CRANIAL NERVES: II:  No visual field defects.    III-IV-VI: Pupils equal round and reactive to light.  Normal conjugate, extra-ocular eye movements in all directions of gaze.  No nystagmus.  No ptosis.   V:  Normal facial sensation.    VII:  Normal facial symmetry and movements.   VIII:  Normal hearing and vestibular function.   IX-X:  Normal palatal movement.   XI:  Normal shoulder shrug and head rotation.   XII:  Normal tongue strength and range of motion, no deviation or fasciculation.  MOTOR:  No atrophy, fasciculations or abnormal movements.  No pronator drift.   Upper Extremity:  Right  Left  Deltoid  5/5   5/5   Biceps  5/5   5/5   Triceps  5/5   5/5   Infraspinatus 5/5  5/5  Medial pectoralis 5/5  5/5  Wrist extensors  5/5   5/5   Wrist flexors  5/5   5/5   Finger extensors  5/5   5/5   Finger flexors  5/5   5/5   Dorsal interossei  5/5   5/5   Abductor pollicis  5/5   5/5   Tone (Ashworth scale)  0  0   Lower Extremity:  Right  Left  Hip flexors  5/5   5/5   Hip extensors  5/5   5/5   Adductor 5/5  5/5  Abductor 5/5  5/5  Knee flexors  5/5   5/5   Knee extensors  5/5   5/5   Dorsiflexors  5/5   5/5   Plantarflexors  5/5   5/5   Toe extensors  5/5   5/5   Toe flexors  5/5   5/5   Tone (Ashworth scale)  0  0   MSRs:  Right        Left                  brachioradialis 2+  2+  biceps 2+  2+  triceps 2+  2+  patellar 2+  2+  ankle jerk 0  0  plantar response down  down   SENSORY:  Vibration reduced to 50% at the ankles, pin prick and temperature intact throughout.  Romberg's sign absent.    COORDINATION/GAIT: Normal finger-to- nose-finger.  Intact rapid alternating movements bilaterally.  Gait is wide-based due to body habitus, unsteady and unassisted.  Stressed gait intact.  Unsteady with tandem gait.   IMPRESSION: Peripheral neuropathy affecting the feet, risk factors: history of alcohol use, low vitamin B12 Low vitamin 12 Unsteady gait   PLAN/RECOMMENDATIONS:  Check folate, vitamin B1, copper, SPEP with [facility] Out-patient PT for balance training Start OTC vitamin B12 daily  Start using a cane, patient high fall risk on anticoagulation.  He is aware of potentially devastating bleed with a fall and thus, I stressed the importance of fall prevention Patient educated on daily foot inspection, fall prevention, and safety precautions around the home. Suggest he try to cut back on alcohol consumption  Return to clinic in 4 months.   Thank you for allowing me to participate in patient's care.  If I can answer any additional questions, I would be pleased to do so.    Sincerely,    Mitali Shenefield K. Allena Katz, DO

## 2021-02-09 LAB — BASIC METABOLIC PANEL
BUN/Creatinine Ratio: 14 (ref 10–24)
BUN: 17 mg/dL (ref 8–27)
CO2: 24 mmol/L (ref 20–29)
Calcium: 9.2 mg/dL (ref 8.6–10.2)
Chloride: 102 mmol/L (ref 96–106)
Creatinine, Ser: 1.2 mg/dL (ref 0.76–1.27)
Glucose: 96 mg/dL (ref 70–99)
Potassium: 5.6 mmol/L — ABNORMAL HIGH (ref 3.5–5.2)
Sodium: 137 mmol/L (ref 134–144)
eGFR: 63 mL/min/{1.73_m2} (ref >59)

## 2021-02-13 LAB — IMMUNOFIXATION, SERUM
IgA/Immunoglobulin A, Serum: 108 mg/dL (ref 61–437)
IgG (Immunoglobin G), Serum: 1439 mg/dL (ref 603–1613)
IgM (Immunoglobulin M), Srm: 85 mg/dL (ref 15–143)

## 2021-02-13 LAB — PROTEIN ELECTROPHORESIS
A/G Ratio: 1.2 (ref 0.7–1.7)
Albumin ELP: 3.7 g/dL (ref 2.9–4.4)
Alpha 1: 0.2 g/dL (ref 0.0–0.4)
Alpha 2: 0.7 g/dL (ref 0.4–1.0)
Beta: 0.8 g/dL (ref 0.7–1.3)
Gamma Globulin: 1.3 g/dL (ref 0.4–1.8)
Globulin, Total: 3 g/dL (ref 2.2–3.9)
Total Protein: 6.7 g/dL (ref 6.0–8.5)

## 2021-02-13 LAB — FOLATE: Folate: 4.8 ng/mL (ref >3.0)

## 2021-02-13 LAB — VITAMIN B1: Thiamine: 126.7 nmol/L (ref 66.5–200.0)

## 2021-02-13 LAB — COPPER, SERUM: Copper: 122 ug/dL (ref 69–132)

## 2021-02-25 ENCOUNTER — Other Ambulatory Visit: Payer: Self-pay | Admitting: Cardiology

## 2021-02-25 NOTE — Telephone Encounter (Signed)
Prescription refill request for Eliquis received. Indication: afib  Last office visit: 01/08/2021, [place] Scr: 1.20, 02/08/2021 Age: 75 yo  Weight: 123.4 kg   Refill sent. Appropriate dose.

## 2021-03-07 ENCOUNTER — Ambulatory Visit: Payer: Medicare Other | Admitting: Physical Therapy

## 2021-03-08 ENCOUNTER — Ambulatory Visit: Payer: Medicare Other | Admitting: Neurology

## 2021-03-20 IMAGING — DX DG HAND COMPLETE 3+V*L*
3 series · 3 of 3 positions shown · non-contrast
Comparison: None.

CLINICAL DATA: Fall, laceration, initial encounter.

EXAM:
LEFT HAND - COMPLETE 3+ VIEW

[hand pa]
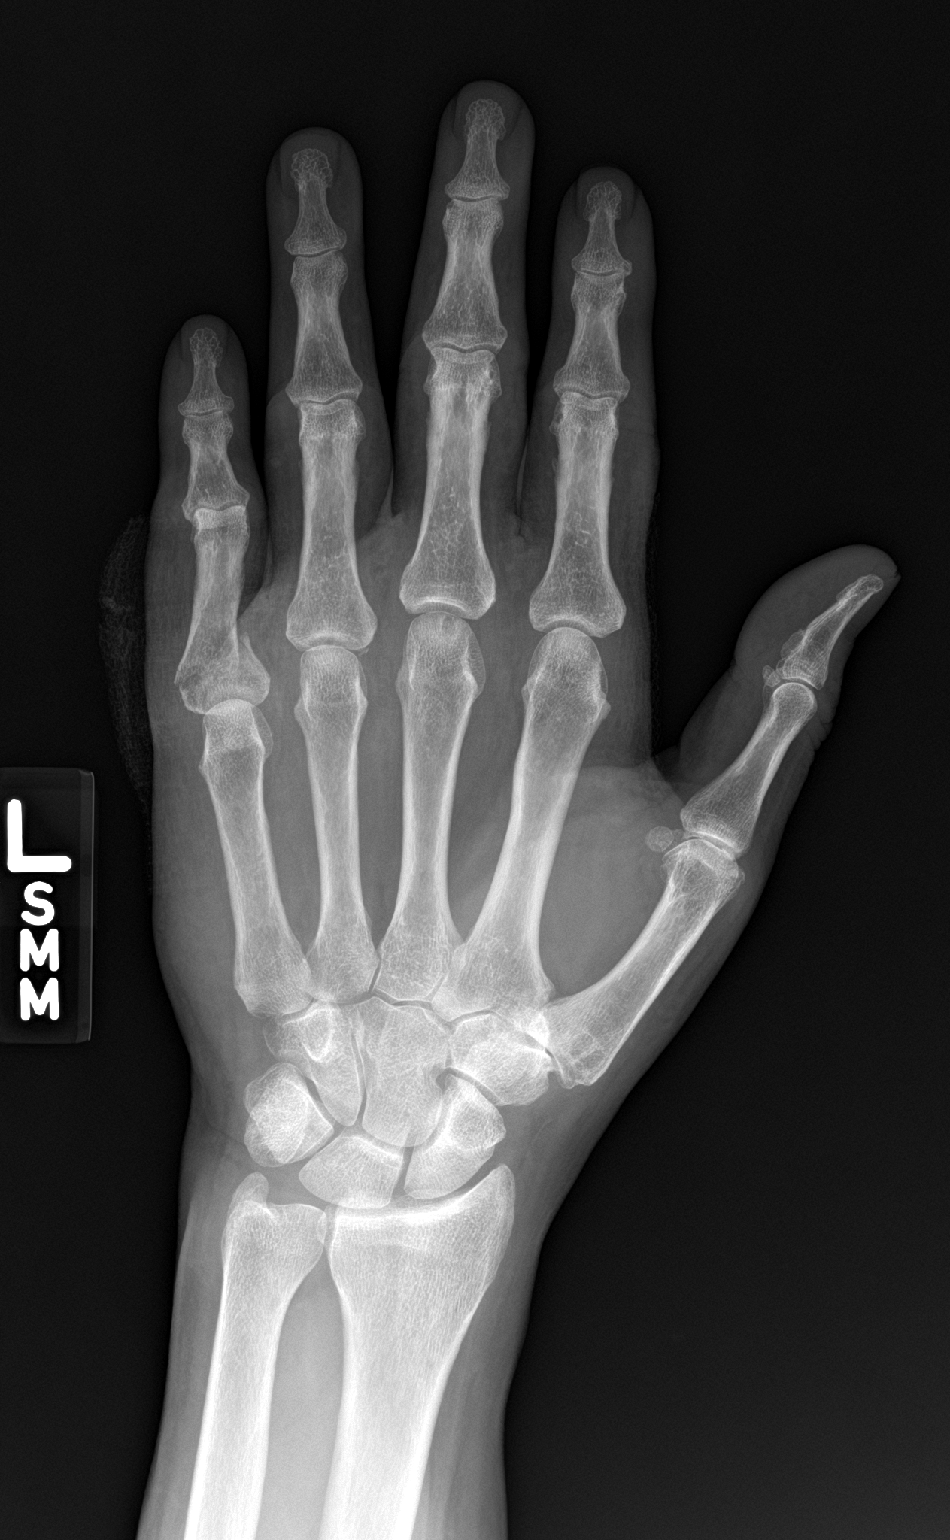

[hand obl]
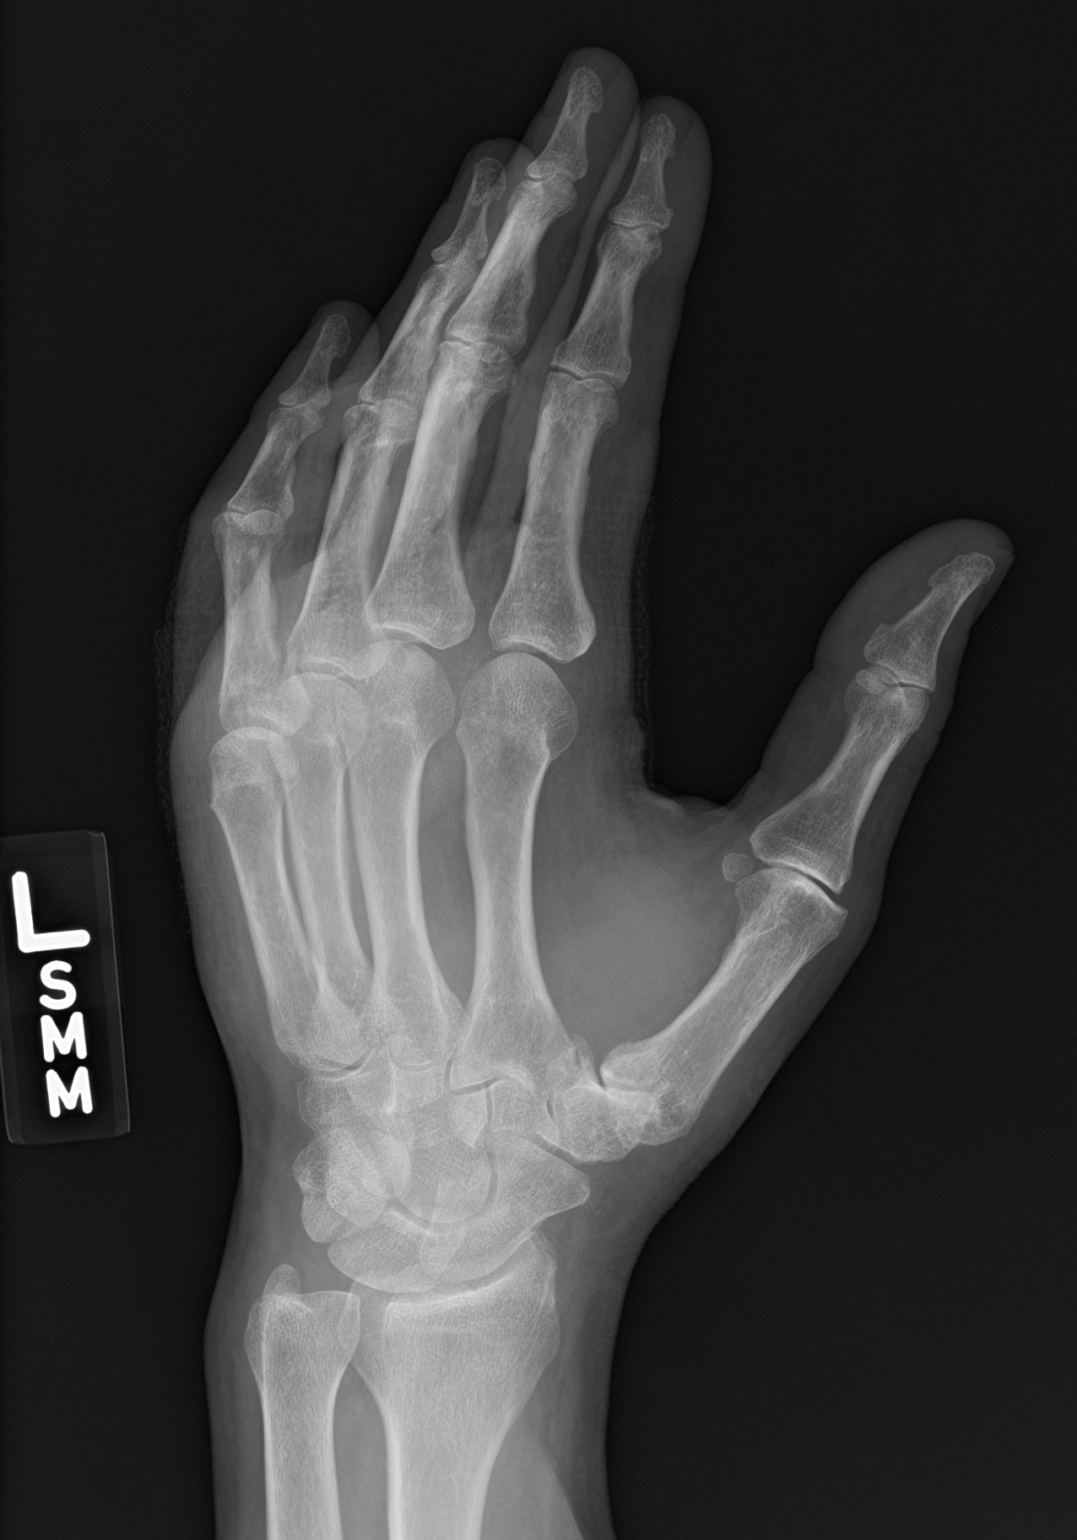

[hand lat]
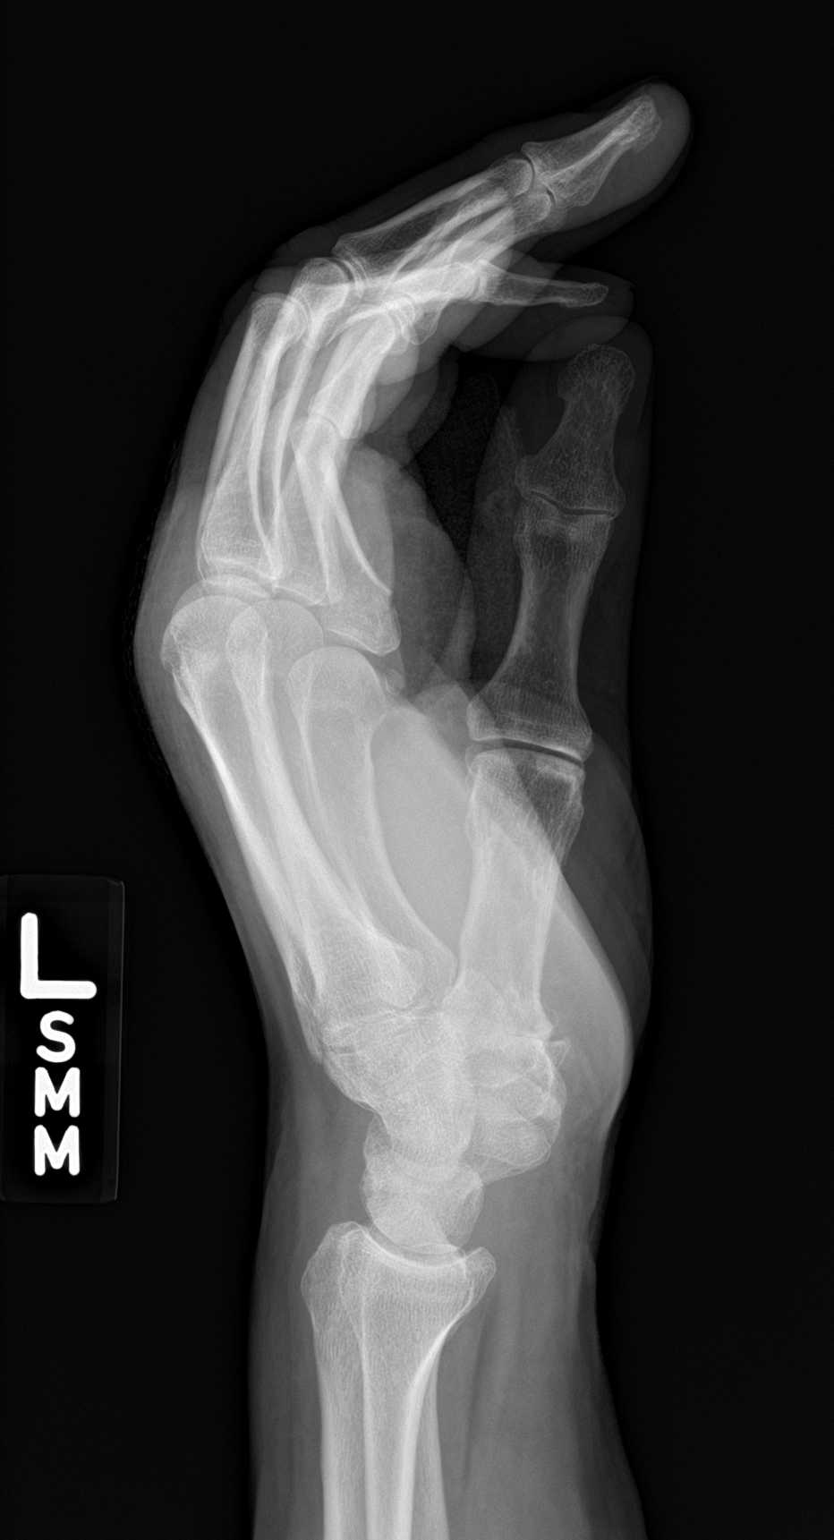

[3 of 3 positions shown; findings below may reference images not displayed]

FINDINGS: There is a slightly displaced and obliquely oriented fracture
involving the base of the fifth proximal phalanx, with extension to
the fifth metacarpophalangeal joint. Approximately 2 mm medial
displacement of the distal fracture fragment. Overlying soft tissue
swelling. Degenerative changes at the first carpometacarpal joint.
IMPRESSION: 1. Fracture at the base of the fifth proximal phalanx with
involvement of the fifth metacarpophalangeal joint.
2. First carpometacarpal joint osteoarthritis.

## 2021-04-29 ENCOUNTER — Other Ambulatory Visit: Payer: Self-pay | Admitting: Cardiology

## 2021-06-12 ENCOUNTER — Encounter: Payer: Self-pay | Admitting: Neurology

## 2021-06-12 ENCOUNTER — Ambulatory Visit: Payer: Medicare Other | Admitting: Neurology

## 2021-06-12 ENCOUNTER — Other Ambulatory Visit: Payer: Self-pay

## 2021-06-12 VITALS — BP 161/78 | HR 65 | Ht 71.0 in | Wt 278.0 lb

## 2021-06-12 DIAGNOSIS — G629 Polyneuropathy, unspecified: Secondary | ICD-10-CM

## 2021-06-12 DIAGNOSIS — R2681 Unsteadiness on feet: Secondary | ICD-10-CM | POA: Diagnosis not present

## 2021-06-12 DIAGNOSIS — R413 Other amnesia: Secondary | ICD-10-CM

## 2021-06-12 NOTE — Progress Notes (Signed)
Follow-up Visit   Date: 06/12/21   Ronald Hull MRN: [id number] DOB: Jan 02, 1946   Interim History: Ronald Hull is a 76 y.o. right-handed Caucasian male with CAD, atrial fibrllation, and hypertension returning to the clinic for follow-up of neuropathy affecting the feet.  The patient was accompanied to the clinic by self.  History of present illness: Starting around 2021, he began having sensation that his feet are wrapped in gauze or walking on sand.  Symptoms became more bothersome since the summer of 2022.  He denies burning or tingling.  It involves the soles and balls of the feet.  Symptoms are noticeable when he is resting or in bed.  Balance is getting worse for sometime.  He has fallen several times this year, one with injury.  He does not use a cane and has not done PT.    He lives at home with his wife in a 2-level home.  He works as a Estate manager/land agent.  He drinks 2 glasses wine per day.  He was previously drinking 4 glasses wine nightly prior to two years ago.    Vitamin B12 level was 257. He is not taking multivitamin or supplements.  NCS/EMG of the legs performed by Dr. Neale Burly  shows chronic sensorimotor axonal polyneuropathy.   UPDATE 06/12/2021:  He is here for follow-up.  There has been no change in neuropathy, which remains restricted to the feet.  He has reduced sensation, no pain.  Balance is fair.  He missed PT appointment due to travel interruption secondary to weather, and when he tried to call back there was no answer. He is still interested in doing PT.  He walks unassisted, no falls.    Lately, he has noticed being more forgetful, especially with names.  He works as a Engineer, water and has not had any problems keeping up with work duties.  He manages finances, medications, and denies problems with navigation.     Medications:  Current Outpatient Medications on File Prior to Visit  Medication Sig Dispense Refill   acetaminophen (TYLENOL)  325 MG tablet Take 325 mg by mouth every 6 (six) hours as needed (for pain.).      amiodarone (PACERONE) 200 MG tablet TAKE 1 TABLET(200 MG) BY MOUTH DAILY 90 tablet 2   ASPIRIN LOW DOSE 81 MG chewable tablet Chew 81 mg by mouth every evening.   0   atorvastatin (LIPITOR) 40 MG tablet Take 1 tablet (40 mg total) by mouth daily. Please make overdue appt with Dr. Excell Seltzer before anymore refills. Thank you 3rd and Final Attempt 15 tablet 0   ELIQUIS 5 MG TABS tablet TAKE 1 TABLET(5 MG) BY MOUTH TWICE DAILY 60 tablet 5   furosemide (LASIX) 20 MG tablet Take 1-2 tablets (20-40 mg total) by mouth daily as needed for fluid.     losartan (COZAAR) 100 MG tablet Take 1 tablet (100 mg total) by mouth daily. 90 tablet 2   metoprolol succinate (TOPROL-XL) 25 MG 24 hr tablet TAKE 1 TABLET(25 MG) BY MOUTH DAILY WITH OR IMMEDIATELY FOLLOWING A MEAL 90 tablet 3   nitroGLYCERIN (NITROSTAT) 0.4 MG SL tablet PLACE 1 TABLET UNDER THE TONGUE AS NEEDED FOR CHEST PAIN EVERY 5 MINUTES AS NEEDED 25 tablet 9   zaleplon (SONATA) 5 MG capsule Take 5 mg by mouth at bedtime as needed for sleep.     No current facility-administered medications on file prior to visit.    Allergies: No Known Allergies  Vital Signs:  BP (!) 161/78    Pulse 65    Ht 5\' 11"  (1.803 m)    Wt 278 lb (126.1 kg)    SpO2 97%    BMI 38.77 kg/m   Neurological Exam: MENTAL STATUS including orientation to time, place, person, recent and remote memory, attention span and concentration, language, and fund of knowledge is normal.  Speech is not dysarthric. Montreal Cognitive Assessment  06/12/2021  Visuospatial/ Executive (0/5) 5  Naming (0/3) 3  Attention: Read list of digits (0/2) 2  Attention: Read list of letters (0/1) 1  Attention: Serial 7 subtraction starting at 100 (0/3) 3  Language: Repeat phrase (0/2) 2  Language : Fluency (0/1) 1  Abstraction (0/2) 2  Delayed Recall (0/5) 5  Orientation (0/6) 6  Total 30  Adjusted Score (based on education)  30   CRANIAL NERVES:   Normal conjugate, extra-ocular eye movements in all directions of gaze.  No ptosis.    MOTOR:  Motor strength is 5/5 in all extremities, including distally.  No atrophy, fasciculations or abnormal movements.  No pronator drift.  Tone is normal.    MSRs:  Reflexes are 2+/4 throughout, except absent at the ankles.  SENSORY:  Vibration is reduced at the ankle and toes, but still perceivable.  Vibration normal at the knees. 08-01-1996  COORDINATION/GAIT:   Gait wide-based and stable, unassisted.  Stressed gait is intact.  Unsteady with tandem gait but able to perform.  Data: Labs 02/08/2021:  copper 122, thiamine 126, folate 4.8, SPEP with IFE no M protein  NCS/EMG of the legs 12/19/2020:  Chronic sensorimotor axonal and demyelinating neuropathy.   IMPRESSION/PLAN: Peripheral neuropathy affecting the feet, risk factors: history of alcohol use, low vitamin B12, age - Clinically stable - Patient educated on daily foot inspection, fall prevention, and safety precautions around the home.  Unsteady gait - Refer to out-patient PT for balance training Memory changes, most likely mild cognitive impairment.  Patient is high functioning, continues to work as an 12/21/2020 and mostly reports forgetting names.  MOCA today was 30/30 and very reassuring.  He is concerned about these symptoms because of the nature of his work, so will proceed with MRI brain and neuropsychological testing.  Follow-up after testing  Thank you for allowing me to participate in patient's care.  If I can answer any additional questions, I would be pleased to do so.    Sincerely,    Amany Rando K. Pensions consultant, DO

## 2021-06-12 NOTE — Patient Instructions (Addendum)
Referral for out-patient physical therapy for balance training  MRI brain without contrast  Formal neuropsychological testing  Follow-up after testing

## 2021-06-21 DIAGNOSIS — D225 Melanocytic nevi of trunk: Secondary | ICD-10-CM | POA: Diagnosis not present

## 2021-06-21 DIAGNOSIS — L570 Actinic keratosis: Secondary | ICD-10-CM | POA: Diagnosis not present

## 2021-06-21 DIAGNOSIS — X32XXXD Exposure to sunlight, subsequent encounter: Secondary | ICD-10-CM | POA: Diagnosis not present

## 2021-06-21 DIAGNOSIS — Z1283 Encounter for screening for malignant neoplasm of skin: Secondary | ICD-10-CM | POA: Diagnosis not present

## 2021-06-26 ENCOUNTER — Telehealth: Payer: Self-pay | Admitting: *Deleted

## 2021-06-26 NOTE — Telephone Encounter (Signed)
? ?  Pre-operative Risk Assessment  ?  ?Patient Name: Ronald Hull  ?DOB: May 17, 1945 ?MRN: [id number]  ? ?  ? ?Request for Surgical Clearance   ? ?Procedure:   COLONOSCOPY ? ?Date of Surgery:  Clearance 09/26/21                              ?   ?Surgeon:  DR. Ewing Schlein  ?Surgeon's Group or Practice Name:  [facility] ?Phone number:  [phone] ?Fax number:  [phone] ?  ?Type of Clearance Requested:   ?- Medical  ?- Pharmacy:  Hold Apixaban (Eliquis)   ?  ?Type of Anesthesia:   PROPOFOL ?  ?Additional requests/questions:   ? ?Signed, ?Danielle Rankin   ?06/26/2021, 9:28 AM  ? ?

## 2021-06-28 NOTE — Telephone Encounter (Signed)
Patient with diagnosis of afib on Eliquis for anticoagulation.   ? ?Procedure: colonoscopy ?Date of procedure: 09/26/21 ? ?CHA2DS2-VASc Score = 4  ?This indicates a 4.8% annual risk of stroke. ?The patient's score is based upon: ?CHF History: 0 ?HTN History: 1 ?Diabetes History: 0 ?Stroke History: 0 ?Vascular Disease History: 1 ?Age Score: 2 ?Gender Score: 0 ?  ?CrCl 32mL/min using adjusted body weight due to obesity ?Platelet count 175K ? ?Per office protocol, patient can hold Eliquis for 1-2 days prior to procedure.   ?

## 2021-06-28 NOTE — Telephone Encounter (Signed)
? ?  Primary Cardiologist: Sherren Mocha, MD ? ?Chart reviewed as part of pre-operative protocol coverage. Given past medical history and time since last visit, based on ACC/AHA guidelines, Ronald Hull would be at acceptable risk for the planned procedure without further cardiovascular testing.  ? ?Patient with diagnosis of afib on Eliquis for anticoagulation.   ?  ?Procedure: colonoscopy ?Date of procedure: 09/26/21 ?  ?CHA2DS2-VASc Score = 4  ?This indicates a 4.8% annual risk of stroke. ?The patient's score is based upon: ?CHF History: 0 ?HTN History: 1 ?Diabetes History: 0 ?Stroke History: 0 ?Vascular Disease History: 1 ?Age Score: 2 ?Gender Score: 0 ?  ?CrCl 39mL/min using adjusted body weight due to obesity ?Platelet count 175K ?  ?Per office protocol, patient can hold Eliquis for 1-2 days prior to procedure. ? ?Patient was advised that if he develops new symptoms prior to surgery to contact our office to arrange a follow-up appointment.  He verbalized understanding. ? ?I will route this recommendation to the requesting party via Epic fax function and remove from pre-op pool. ? ?Please call with questions. ? ?Jossie Ng. Zorana Brockwell NP-C ? ?  ?06/28/2021, 8:03 AM ?[facility] ?[address] ?Office [phone] Fax [phone] ? ? ? ? ?

## 2021-07-04 ENCOUNTER — Other Ambulatory Visit: Payer: Self-pay

## 2021-07-04 ENCOUNTER — Encounter: Payer: Self-pay | Admitting: Cardiology

## 2021-07-04 ENCOUNTER — Ambulatory Visit: Payer: Medicare Other | Admitting: Cardiology

## 2021-07-04 VITALS — BP 126/78 | HR 55 | Ht 71.0 in | Wt 279.2 lb

## 2021-07-04 DIAGNOSIS — I4819 Other persistent atrial fibrillation: Secondary | ICD-10-CM | POA: Diagnosis not present

## 2021-07-04 DIAGNOSIS — D6869 Other thrombophilia: Secondary | ICD-10-CM | POA: Diagnosis not present

## 2021-07-04 DIAGNOSIS — Z79899 Other long term (current) drug therapy: Secondary | ICD-10-CM

## 2021-07-04 NOTE — Progress Notes (Addendum)
Electrophysiology Office Note   Date:  07/04/2021   ID:  Ronald Hull, DOB [birth year], MRN [id number]  PCP:  Seward Carol, MD   Primary Electrophysiologist:  Constance Haw, MD    No chief complaint on file.     History of Present Illness: Ronald Hull is a 76 y.o. male who presents today for electrophysiology evaluation.     He has a history significant for atrial fibrillation.  He presented to his primary physician in 2017 with irregular and elevated heart rates.  He had a cardioversion.  He was initially on amiodarone but was switched to Multaq.  He went back into atrial fibrillation and a cardioversion in 2019.  He was initially scheduled for ablation though CT scan showed significant coronary artery disease and he is now status post DES to the RCA.  He had an atrial fibrillation ablation January 2020 with repeat ablation 09/12/2019.  He continued to have episodes of atrial fibrillation and is now on amiodarone.  Today, denies symptoms of palpitations, chest pain, shortness of breath, orthopnea, PND, lower extremity edema, claudication, dizziness, presyncope, syncope, bleeding, or neurologic sequela. The patient is tolerating medications without difficulties.  Since being seen he has done well.  He has noted no further episodes of atrial flutter lesion.  He is overall happy with his control.  He states that he has had a few falls with significant bruising.  He would like to get off of his Eliquis.  He would potentially be interested in [place].  Past Medical History:  Diagnosis Date   Arthritis    Bronchitis 05/23/2018   CAD (coronary artery disease) 02/24/2018   LHC 10/19: pLAD 50, mLAD 50 (dFFR=0.89), oLCx 95, pRCA 30, mRCA 80 >> PCI: DES to LCx; DES to RCA // [id number] Trial participant (Smaran Gaus DC Plavix after 30 d and remain on ASA + Apixaban)   Chest pain secondary to ablation, with expected mild elevation in troponin 0/15/6153   Complication of anesthesia    BP  dropped post colonoscopy   Hypercholesteremia    Hypertension    Lung nodules 02/25/2019   Chest CT 01/2019: LUL nodule unchanged (benign); new LUL nodules 6 mm, aortic atherosclerosis, emphysema, partially calcified pleural plaques suggesting prior empyema or hemothorax   Persistent atrial fibrillation (HCC)    Sleep apnea    does use a cpap   Wears glasses    Past Surgical History:  Procedure Laterality Date   ATRIAL FIBRILLATION ABLATION N/A 05/20/2018   Procedure: ATRIAL FIBRILLATION ABLATION;  Surgeon: Constance Haw, MD;  Location: [place] [facility];  Service: Cardiovascular;  Laterality: N/A;   ATRIAL FIBRILLATION ABLATION N/A 09/02/2019   Procedure: ATRIAL FIBRILLATION ABLATION;  Surgeon: Constance Haw, MD;  Location: [place] [facility];  Service: Cardiovascular;  Laterality: N/A;   CARDIOVERSION N/A 11/01/2015   Procedure: CARDIOVERSION;  Surgeon: Pixie Casino, MD;  Location: [facility] ENDOSCOPY;  Service: Cardiovascular;  Laterality: N/A;   CARDIOVERSION N/A 11/23/2015   Procedure: CARDIOVERSION;  Surgeon: Toneshia Coello Meredith Leeds, MD;  Location: [place];  Service: Cardiovascular;  Laterality: N/A;   CARDIOVERSION N/A 10/12/2017   Procedure: CARDIOVERSION;  Surgeon: Pixie Casino, MD;  Location: [place];  Service: Cardiovascular;  Laterality: N/A;   CARDIOVERSION N/A 06/30/2018   Procedure: CARDIOVERSION;  Surgeon: Jerline Pain, MD;  Location: [facility] ENDOSCOPY;  Service: Cardiovascular;  Laterality: N/A;   CARDIOVERSION N/A 09/22/2018   Procedure: CARDIOVERSION;  Surgeon: Sueanne Margarita, MD;  Location: [facility]  ENDOSCOPY;  Service: Cardiovascular;  Laterality: N/A;   CARDIOVERSION N/A 07/15/2019   Procedure: CARDIOVERSION;  Surgeon: Buford Dresser, MD;  Location: [facility] ENDOSCOPY;  Service: Cardiovascular;  Laterality: N/A;   CARDIOVERSION N/A 12/07/2019   Procedure: CARDIOVERSION;  Surgeon: Skeet Latch, MD;  Location: [facility] ENDOSCOPY;  Service: Cardiovascular;   Laterality: N/A;   CARDIOVERSION N/A 03/19/2020   Procedure: CARDIOVERSION;  Surgeon: Sueanne Margarita, MD;  Location: [facility] ENDOSCOPY;  Service: Cardiovascular;  Laterality: N/A;   COLONOSCOPY     CORONARY STENT INTERVENTION N/A 02/24/2018   Procedure: CORONARY STENT INTERVENTION;  Surgeon: Sherren Mocha, MD;  Location: [place] [facility];  Service: Cardiovascular;  Laterality: N/A;   I & D EXTREMITY Left 10/19/2019   Procedure: IRRIGATION AND DEBRIDEMENT LEFT SMALL PROXIMAL PHALANX;  Surgeon: Iran Planas, MD;  Location: [place];  Service: Orthopedics;  Laterality: Left;   INTRAVASCULAR PRESSURE WIRE/FFR STUDY N/A 02/24/2018   Procedure: INTRAVASCULAR PRESSURE WIRE/FFR STUDY;  Surgeon: Sherren Mocha, MD;  Location: [place] [facility];  Service: Cardiovascular;  Laterality: N/A;   KNEE ARTHROSCOPY  2000   LEFT HEART CATH AND CORONARY ANGIOGRAPHY N/A 02/24/2018   Procedure: LEFT HEART CATH AND CORONARY ANGIOGRAPHY;  Surgeon: Sherren Mocha, MD;  Location: [place] [facility];  Service: Cardiovascular;  Laterality: N/A;   OPEN REDUCTION INTERNAL FIXATION (ORIF) PROXIMAL PHALANX Left 10/19/2019   Procedure: OPEN REDUCTION INTERNAL FIXATION (ORIF) PROXIMAL PHALANX WITH K-WIRES LEFT SMALL;  Surgeon: Iran Planas, MD;  Location: [place];  Service: Orthopedics;  Laterality: Left;   SHOULDER ARTHROSCOPY WITH ROTATOR CUFF REPAIR AND SUBACROMIAL DECOMPRESSION Right 07/15/2012   Procedure: RIGHT ARTHROSCOPY SHOULDER DECOMPRESSION  SUBACROMIAL PARTIAL ACROMIOPLASTY WITH CORACOACROMIAL RELEASE,  DISTAL CLAVICULECTOMY, resect biceps debride labrium WITH ROTATOR CUFF REPAIR;  Surgeon: Ninetta Lights, MD;  Location: [place];  Service: Orthopedics;  Laterality: Right;     Current Outpatient Medications  Medication Sig Dispense Refill   acetaminophen (TYLENOL) 325 MG tablet Take 325 mg by mouth every 6 (six) hours as needed (for pain.).      amiodarone  (PACERONE) 200 MG tablet Take 100 mg by mouth daily.     ASPIRIN LOW DOSE 81 MG chewable tablet Chew 81 mg by mouth every evening.   0   atorvastatin (LIPITOR) 40 MG tablet Take 1 tablet (40 mg total) by mouth daily. Please make overdue appt with Dr. Burt Knack before anymore refills. Thank you 3rd and Final Attempt 15 tablet 0   ELIQUIS 5 MG TABS tablet TAKE 1 TABLET(5 MG) BY MOUTH TWICE DAILY 60 tablet 5   furosemide (LASIX) 20 MG tablet Take 1-2 tablets (20-40 mg total) by mouth daily as needed for fluid.     losartan (COZAAR) 100 MG tablet Take 1 tablet (100 mg total) by mouth daily. 90 tablet 2   metoprolol succinate (TOPROL-XL) 25 MG 24 hr tablet TAKE 1 TABLET(25 MG) BY MOUTH DAILY WITH OR IMMEDIATELY FOLLOWING A MEAL 90 tablet 3   nitroGLYCERIN (NITROSTAT) 0.4 MG SL tablet PLACE 1 TABLET UNDER THE TONGUE AS NEEDED FOR CHEST PAIN EVERY 5 MINUTES AS NEEDED 25 tablet 9   zaleplon (SONATA) 5 MG capsule Take 5 mg by mouth at bedtime as needed for sleep.     No current facility-administered medications for this visit.    Allergies:   Patient has no known allergies.   Social History:  The patient  reports that he quit smoking about 48 years ago. His smoking use  included cigarettes. He has a 20.00 pack-year smoking history. He has never used smokeless tobacco. He reports current alcohol use of about 2.0 standard drinks per week. He reports that he does not use drugs.   Family History:  The patient's family history includes Cancer in his sister; Clotting disorder in his mother; Heart disease in his brother; Heart failure in his father; Hypertension in his sister; Other in his mother.   ROS:  Please see the history of present illness.   Otherwise, review of systems is positive for none.   All other systems are reviewed and negative.   PHYSICAL EXAM: VS:  BP 126/78    Pulse (!) 55    Ht '5\' 11"'  (1.803 m)    Wt 279 lb 3.2 oz (126.6 kg)    SpO2 96%    BMI 38.94 kg/m  , BMI Body mass index is 38.94  kg/m. GEN: Well nourished, well developed, in no acute distress  HEENT: normal  Neck: no JVD, carotid bruits, or masses Cardiac: RRR; no murmurs, rubs, or gallops,no edema  Respiratory:  clear to auscultation bilaterally, normal work of breathing GI: soft, nontender, nondistended, + BS MS: no deformity or atrophy  Skin: warm and dry Neuro:  Strength and sensation are intact Psych: euthymic mood, full affect  EKG:  EKG is ordered today. Personal review of the ekg ordered shows sinus rhythm, rate 55  Recent Labs: 01/22/2021: ALT 17; Hemoglobin 14.5; Platelets 175; TSH 2.630 02/08/2021: BUN 17; Creatinine, Ser 1.20; Potassium 5.6; Sodium 137    Lipid Panel     Component Value Date/Time   CHOL 96 (L) 04/09/2018 0959   TRIG 77 04/09/2018 0959   HDL 31 (L) 04/09/2018 0959   CHOLHDL 3.1 04/09/2018 0959   LDLCALC 50 04/09/2018 0959     Wt Readings from Last 3 Encounters:  07/04/21 279 lb 3.2 oz (126.6 kg)  06/12/21 278 lb (126.1 kg)  02/08/21 272 lb (123.4 kg)      Other studies Reviewed: Additional studies/ records that were reviewed today include: TTE 09/2015 - Left ventricle: The cavity size was normal. Wall thickness was   increased in a pattern of moderate LVH. Systolic function was   normal. The estimated ejection fraction was in the range of 55%   to 60%. Indeterminant diastolic function. Wall motion was normal;   there were no regional wall motion abnormalities. - Aortic valve: There was no stenosis. There was trivial   regurgitation. - Mitral valve: Mildly calcified annulus. There was mild   regurgitation. - Left atrium: The atrium was mildly dilated. - Right ventricle: The cavity size was normal. Systolic function   was normal. - Right atrium: The atrium was mildly dilated. - Tricuspid valve: Peak RV-RA gradient (S): 26 mm Hg. - Pulmonary arteries: PA peak pressure: 29 mm Hg (S). - Inferior vena cava: The vessel was normal in size. The   respirophasic diameter  changes were in the normal range (= 50%),   consistent with normal central venous pressure.  LHC 02/24/18 Mid RCA lesion is 80% stenosed. Prox RCA lesion is 30% stenosed. Ost Cx to Prox Cx lesion is 95% stenosed. Prox LAD lesion is 50% stenosed. Prox LAD to Mid LAD lesion is 50% stenosed. A drug-eluting stent was successfully placed using a STENT SIERRA 3.50 X 15 MM. Post intervention, there is a 0% residual stenosis. A drug-eluting stent was successfully placed using a STENT SIERRA 4.00 X 15 MM. Post intervention, there is a 0% residual stenosis.  ASSESSMENT AND PLAN:  1.  Persistent atrial fibrillation: Currently on Eliquis 5 mg twice daily, amiodarone 100 mg daily.  High risk medication monitoring for amiodarone labs checked today.  CHA2DS2-VASc of 4.  He is having some bruising on his Eliquis.  He would like to be off of the medication.  Due to that, we Ronald Hull refer him for watchman.  2.  Hypertension: Currently well controlled  3.  Obstructive sleep apnea: CPAP compliance encouraged  4.  Morbid obesity: Diet and exercise encouraged  5.  Coronary artery disease: Currently on Eliquis 5 mg twice daily.  No current chest pain.  6.  Secondary hypercoagulable state: Currently on Eliquis for atrial fibrillation as above.  [facility] Referral for Left Atrial Appendage Closure with Non-Valvular Atrial Fibrillation   Ronald Hull is a 76 y.o. male is being referred to the [facility] Team for evaluation for Left Atrial Appendage Closure with Watchman device for the management of stroke risk resulting form non-valvular atrial fibrillation.    Base upon Mr. Wolden history, he is felt to be a poor candidate for long-term anticoagulation because of a high risk of recurrent falls.  The patient has a HAS-BLED score of   indicating a Yearly Major Bleeding Risk of  %.      His CHADS2-VASc Score is 4 with an unadjusted Ischemic Stroke Rate (% per year) of  4.8%.    His stroke risk necessitates a strategy of stroke prevention with either long-term oral anticoagulation or left atrial appendage occlusion therapy. We have discussed their bleeding risk in the context of their comorbid medical problems, as well as the rationale for referral for evaluation of Watchman left atrial appendage occlusion therapy. While the patient is at high long-term bleeding risk, they may be appropriate for short-term anticoagulation. Based on this individual patient's stroke and bleeding risk, a shared decision has been made to refer the patient for consideration of Watchman left atrial appendage closure utilizing the [employer] of Cardiology shared decision tool.   Current medicines are reviewed at length with the patient today.   The patient does not have concerns regarding his medicines.  The following changes were made today: None  Labs/ tests ordered today include:  Orders Placed This Encounter  Procedures   Comp Met (CMET)   TSH   CBC   EKG 12-Lead     Disposition:   FU with Nadim Malia 6 months  Signed, Eulene Pekar Meredith Leeds, MD  07/04/2021 9:57 AM     [address] [place] [address] [phone] (office) [phone] (fax)

## 2021-07-04 NOTE — Patient Instructions (Signed)
Medication Instructions:  ?Your physician recommends that you continue on your current medications as directed. Please refer to the Current Medication list given to you today. ? ?*If you need a refill on your cardiac medications before your next appointment, please call your pharmacy* ? ? ?Lab Work: ?Amiodarone & Eliquis surveillance lab work today: CMET, TSH and CBC ? ?If you have labs (blood work) drawn today and your tests are completely normal, you will receive your results only by: ?MyChart Message (if you have MyChart) OR ?A paper copy in the mail ?If you have any lab test that is abnormal or we need to change your treatment, we will call you to review the results. ? ? ?Testing/Procedures: ?None ordered ? ? ?Follow-Up: ?At [facility], you and your health needs are our priority.  As part of our continuing mission to provide you with exceptional heart care, we have created designated Provider Care Teams.  These Care Teams include your primary Cardiologist (physician) and Advanced Practice Providers (APPs -  Physician Assistants and Nurse Practitioners) who all work together to provide you with the care you need, when you need it. ? ?Your next appointment:   ?6 month(s) ? ?The format for your next appointment:   ?In Person ? ?Provider:   ?You will see one of the following Advanced Practice Providers on your designated Care Team:   ?Francis Dowse, PA-C ?Casimiro Needle "Mardelle Matte" [place], PA-C ? ? ?You have been referred to Dr. Lalla Brothers to discuss watchman ? ? ? ? ?Thank you for choosing [facility]!! ? ? ?Dory Horn, RN ?([phone] ? ?  ?

## 2021-07-07 NOTE — Progress Notes (Deleted)
Saw camnitz 07/04/2021 ? ?AF. Previously on amio, then multaq. Cardioverted in 2019. AF ablation 04/2018 followed by 09/12/2019. Now back on amiodarone. ? ?Interested in getting off anticoagulation because of falls with significant bruising. ? ?I sent imagers a message to review images ? ?watchman ?

## 2021-07-08 ENCOUNTER — Ambulatory Visit
Admission: RE | Admit: 2021-07-08 | Discharge: 2021-07-08 | Disposition: A | Discharge status: Home / Self Care | Payer: Medicare Other | Source: Ambulatory Visit | Attending: Neurology | Admitting: Neurology

## 2021-07-08 DIAGNOSIS — G319 Degenerative disease of nervous system, unspecified: Secondary | ICD-10-CM | POA: Diagnosis not present

## 2021-07-08 DIAGNOSIS — R413 Other amnesia: Secondary | ICD-10-CM

## 2021-07-08 DIAGNOSIS — G629 Polyneuropathy, unspecified: Secondary | ICD-10-CM

## 2021-07-08 DIAGNOSIS — R2681 Unsteadiness on feet: Secondary | ICD-10-CM

## 2021-07-09 ENCOUNTER — Ambulatory Visit: Payer: Medicare Other | Admitting: Cardiology

## 2021-07-09 ENCOUNTER — Encounter: Payer: Self-pay | Admitting: Cardiology

## 2021-07-09 ENCOUNTER — Other Ambulatory Visit: Payer: Self-pay

## 2021-07-09 VITALS — BP 126/72 | HR 53 | Ht 71.0 in | Wt 282.0 lb

## 2021-07-09 DIAGNOSIS — I10 Essential (primary) hypertension: Secondary | ICD-10-CM | POA: Diagnosis not present

## 2021-07-09 DIAGNOSIS — I4819 Other persistent atrial fibrillation: Secondary | ICD-10-CM | POA: Diagnosis not present

## 2021-07-09 NOTE — Patient Instructions (Signed)
Medication Instructions:  ?Your physician recommends that you continue on your current medications as directed. Please refer to the Current Medication list given to you today. ? ?*If you need a refill on your cardiac medications before your next appointment, please call your pharmacy* ? ? ?Lab Work: ?None today ?If you have labs (blood work) drawn today and your tests are completely normal, you will receive your results only by: ?MyChart Message (if you have MyChart) OR ?A paper copy in the mail ?If you have any lab test that is abnormal or we need to change your treatment, we will call you to review the results. ? ? ?Follow-Up: ?At [facility], you and your health needs are our priority.  As part of our continuing mission to provide you with exceptional heart care, we have created designated Provider Care Teams.  These Care Teams include your primary Cardiologist (physician) and Advanced Practice Providers (APPs -  Physician Assistants and Nurse Practitioners) who all work together to provide you with the care you need, when you need it. ? ?Your next appointment:   ?CT Scan on 07/12/2021 ?

## 2021-07-09 NOTE — H&P (View-Only) (Signed)
?Electrophysiology Office Note:   ? ?Date:  07/09/2021  ? ?ID:  Ronald Hull, DOB [birth year], MRN [id number] ? ?PCP:  Ronald Dills, MD  ?[facility] Cardiologist:  Tonny Bollman, MD  ?[facility] Electrophysiologist:  Will [name], MD  ? ?Referring MD: Ronald Dills, MD  ? ?Chief Complaint: Consult for Watchman ? ?History of Present Illness:   ? ?Ronald Hull is a 76 y.o. male who presents for discussion of Watchman at the request of Dr. Elberta Hull. Their medical history includes persistent atrial fibrillation, CAD, hypertension, hyperlipidemia, sleep apnea not on CPAP, arthritis, bronchitis, and lung nodules. ? ?Overall, he appears well. He is interested in proceeding with Watchman. ? ?He reports having several significant falls causing severe bruising. He remains compliant with Eliquis. ? ?He denies any palpitations, chest pain, shortness of breath, or peripheral edema. No lightheadedness, headaches, syncope, orthopnea, or PND. ? ? ?  ?Past Medical History:  ?Diagnosis Date  ? Arthritis   ? Bronchitis 05/23/2018  ? CAD (coronary artery disease) 02/24/2018  ? LHC 10/19: pLAD 50, mLAD 50 (dFFR=0.89), oLCx 95, pRCA 30, mRCA 80 >> PCI: DES to LCx; DES to RCA // Xience28 Trial participant (will DC Plavix after 30 d and remain on ASA + Apixaban)  ? Chest pain secondary to ablation, with expected mild elevation in troponin 05/22/2018  ? Complication of anesthesia   ? BP dropped post colonoscopy  ? Hypercholesteremia   ? Hypertension   ? Lung nodules 02/25/2019  ? Chest CT 01/2019: LUL nodule unchanged (benign); new LUL nodules 6 mm, aortic atherosclerosis, emphysema, partially calcified pleural plaques suggesting prior empyema or hemothorax  ? Persistent atrial fibrillation (HCC)   ? Sleep apnea   ? does use a cpap  ? Wears glasses   ? ? ?Past Surgical History:  ?Procedure Laterality Date  ? ATRIAL FIBRILLATION ABLATION N/A 05/20/2018  ? Procedure: ATRIAL FIBRILLATION ABLATION;  Surgeon: Regan Lemming, MD;  [facility];  Service: Cardiovascular;  Laterality: N/A;  ? ATRIAL FIBRILLATION ABLATION N/A 09/02/2019  ? Procedure: ATRIAL FIBRILLATION ABLATION;  Surgeon: Regan Lemming, MD;  [facility];  Service: Cardiovascular;  Laterality: N/A;  ? CARDIOVERSION N/A 11/01/2015  ? Procedure: CARDIOVERSION;  Surgeon: [name], MD;  Location: [facility] ENDOSCOPY;  Service: Cardiovascular;  Laterality: N/A;  ? CARDIOVERSION N/A 11/23/2015  ? Procedure: CARDIOVERSION;  Surgeon: Will [name], MD;  Location: [facility] ENDOSCOPY;  Service: Cardiovascular;  Laterality: N/A;  ? CARDIOVERSION N/A 10/12/2017  ? Procedure: CARDIOVERSION;  Surgeon: [name], MD;  Location: [facility] ENDOSCOPY;  Service: Cardiovascular;  Laterality: N/A;  ? CARDIOVERSION N/A 06/30/2018  ? Procedure: CARDIOVERSION;  Surgeon: Jake Bathe, MD;  Location: [facility] ENDOSCOPY;  Service: Cardiovascular;  Laterality: N/A;  ? CARDIOVERSION N/A 09/22/2018  ? Procedure: CARDIOVERSION;  Surgeon: Quintella Reichert, MD;  Location: [facility] ENDOSCOPY;  Service: Cardiovascular;  Laterality: N/A;  ? CARDIOVERSION N/A 07/15/2019  ? Procedure: CARDIOVERSION;  Surgeon: Jodelle Red, MD;  Location: [facility] ENDOSCOPY;  Service: Cardiovascular;  Laterality: N/A;  ? CARDIOVERSION N/A 12/07/2019  ? Procedure: CARDIOVERSION;  Surgeon: Chilton Si, MD;  Location: [facility] ENDOSCOPY;  Service: Cardiovascular;  Laterality: N/A;  ? CARDIOVERSION N/A 03/19/2020  ? Procedure: CARDIOVERSION;  Surgeon: Quintella Reichert, MD;  Location: [facility] ENDOSCOPY;  Service: Cardiovascular;  Laterality: N/A;  ? COLONOSCOPY    ? CORONARY STENT INTERVENTION N/A 02/24/2018  ? Procedure: CORONARY STENT INTERVENTION;  Surgeon: Tonny Bollman, MD;  Location:  [place] INVASIVE CV LAB;  Service: Cardiovascular;  Laterality: N/A;  ? I & D EXTREMITY Left 10/19/2019  ? Procedure: IRRIGATION AND DEBRIDEMENT LEFT SMALL PROXIMAL PHALANX;  Surgeon: Iran Planas, MD;  Location: [place];  Service: Orthopedics;  Laterality: Left;  ? INTRAVASCULAR PRESSURE WIRE/FFR STUDY N/A 02/24/2018  ? Procedure: INTRAVASCULAR PRESSURE WIRE/FFR STUDY;  Surgeon: Sherren Mocha, MD;  Location: [place] CV LAB;  Service: Cardiovascular;  Laterality: N/A;  ? KNEE ARTHROSCOPY  2000  ? LEFT HEART CATH AND CORONARY ANGIOGRAPHY N/A 02/24/2018  ? Procedure: LEFT HEART CATH AND CORONARY ANGIOGRAPHY;  Surgeon: Sherren Mocha, MD;  Location: [place] CV LAB;  Service: Cardiovascular;  Laterality: N/A;  ? OPEN REDUCTION INTERNAL FIXATION (ORIF) PROXIMAL PHALANX Left 10/19/2019  ? Procedure: OPEN REDUCTION INTERNAL FIXATION (ORIF) PROXIMAL PHALANX WITH K-WIRES LEFT SMALL;  Surgeon: Iran Planas, MD;  Location: [place];  Service: Orthopedics;  Laterality: Left;  ? SHOULDER ARTHROSCOPY WITH ROTATOR CUFF REPAIR AND SUBACROMIAL DECOMPRESSION Right 07/15/2012  ? Procedure: RIGHT ARTHROSCOPY SHOULDER DECOMPRESSION  SUBACROMIAL PARTIAL ACROMIOPLASTY WITH CORACOACROMIAL RELEASE,  DISTAL CLAVICULECTOMY, resect biceps debride labrium WITH ROTATOR CUFF REPAIR;  Surgeon: Ninetta Lights, MD;  Location: [place];  Service: Orthopedics;  Laterality: Right;  ? ? ?Current Medications: ?Current Meds  ?Medication Sig  ? acetaminophen (TYLENOL) 325 MG tablet Take 325 mg by mouth every 6 (six) hours as needed (for pain.).   ? amiodarone (PACERONE) 200 MG tablet Take 100 mg by mouth daily.  ? ASPIRIN LOW DOSE 81 MG chewable tablet Chew 81 mg by mouth every evening.   ? atorvastatin (LIPITOR) 40 MG tablet Take 1 tablet (40 mg total) by mouth daily. Please make overdue appt with Dr. Burt Knack before anymore refills. Thank you 3rd and Final Attempt  ? ELIQUIS 5 MG TABS tablet TAKE 1 TABLET(5 MG) BY MOUTH TWICE DAILY  ? furosemide (LASIX) 20 MG tablet Take 1-2 tablets (20-40 mg total) by mouth daily as needed for fluid.  ? losartan (COZAAR) 100 MG tablet Take 1 tablet (100 mg total) by mouth daily.   ? metoprolol succinate (TOPROL-XL) 25 MG 24 hr tablet TAKE 1 TABLET(25 MG) BY MOUTH DAILY WITH OR IMMEDIATELY FOLLOWING A MEAL  ? nitroGLYCERIN (NITROSTAT) 0.4 MG SL tablet PLACE 1 TABLET UNDER THE TONGUE AS NEEDED FOR CHEST PAIN EVERY 5 MINUTES AS NEEDED  ? zaleplon (SONATA) 5 MG capsule Take 5 mg by mouth at bedtime as needed for sleep.  ?  ? ?Allergies:   Patient has no known allergies.  ? ?Social History  ? ?Socioeconomic History  ? Marital status: Married  ?  Spouse name: Not on file  ? Number of children: Not on file  ? Years of education: Not on file  ? Highest education level: Not on file  ?Occupational History  ? Not on file  ?Tobacco Use  ? Smoking status: Former  ?  Packs/day: 2.00  ?  Years: 10.00  ?  Pack years: 20.00  ?  Types: Cigarettes  ?  Quit date: 07/09/1973  ?  Years since quitting: 48.0  ? Smokeless tobacco: Never  ?Vaping Use  ? Vaping Use: Never used  ?Substance and Sexual Activity  ? Alcohol use: Yes  ?  Alcohol/week: 2.0 standard drinks  ?  Types: 2 Glasses of wine per week  ? Drug use: No  ? Sexual activity: Not Currently  ?  Birth control/protection: Abstinence  ?Other Topics Concern  ? Not  on file  ?Social History Narrative  ? Right Handed   ? Lives in a two story home   ? ?Social Determinants of Health  ? ?Financial Resource Strain: Not on file  ?Food Insecurity: Not on file  ?Transportation Needs: Not on file  ?Physical Activity: Not on file  ?Stress: Not on file  ?Social Connections: Not on file  ?  ? ?Family History: ?The patient's family history includes Cancer in his sister; Clotting disorder in his mother; Heart disease in his brother; Heart failure in his father; Hypertension in his sister; Other in his mother. ? ?ROS:   ?Please see the history of present illness.    ?(+) Falls ?(+) Bruising of upper and lower extremities ?All other systems reviewed and are negative. ? ?EKGs/Labs/Other Studies Reviewed:   ? ?The following studies were reviewed today: ? ?Atrial Fibrillation  Ablation 09/02/2019: ?CONCLUSIONS: ?1. Atrial fibrillation upon presentation.   ?2. Successful electrical isolation and anatomical encircling of all four pulmonary veins with radiofrequency current.  A [facility] approa

## 2021-07-09 NOTE — Progress Notes (Signed)
?Electrophysiology Office Note:   ? ?Date:  07/09/2021  ? ?ID:  Ronald Hull, DOB 02/17/1946, MRN [id number] ? ?PCP:  Polite, Ronald, MD  ?[facility] Cardiologist:  Michael Cooper, MD  ?[facility] Electrophysiologist:  Will Martin Camnitz, MD  ? ?Referring MD: Polite, Ronald, MD  ? ?Chief Complaint: Consult for Watchman ? ?History of Present Illness:   ? ?Ronald Hull is a 76 y.o. male who presents for discussion of Watchman at the request of Dr. Camnitz. Their medical history includes persistent atrial fibrillation, CAD, hypertension, hyperlipidemia, sleep apnea not on CPAP, arthritis, bronchitis, and lung nodules. ? ?Overall, he appears well. He is interested in proceeding with Watchman. ? ?He reports having several significant falls causing severe bruising. He remains compliant with Eliquis. ? ?He denies any palpitations, chest pain, shortness of breath, or peripheral edema. No lightheadedness, headaches, syncope, orthopnea, or PND. ? ? ?  ?Past Medical History:  ?Diagnosis Date  ? Arthritis   ? Bronchitis 05/23/2018  ? CAD (coronary artery disease) 02/24/2018  ? LHC 10/19: pLAD 50, mLAD 50 (dFFR=0.89), oLCx 95, pRCA 30, mRCA 80 >> PCI: DES to LCx; DES to RCA // Xience28 Trial participant (will DC Plavix after 30 d and remain on ASA + Apixaban)  ? Chest pain secondary to ablation, with expected mild elevation in troponin 05/22/2018  ? Complication of anesthesia   ? BP dropped post colonoscopy  ? Hypercholesteremia   ? Hypertension   ? Lung nodules 02/25/2019  ? Chest CT 01/2019: LUL nodule unchanged (benign); new LUL nodules 6 mm, aortic atherosclerosis, emphysema, partially calcified pleural plaques suggesting prior empyema or hemothorax  ? Persistent atrial fibrillation (HCC)   ? Sleep apnea   ? does use a cpap  ? Wears glasses   ? ? ?Past Surgical History:  ?Procedure Laterality Date  ? ATRIAL FIBRILLATION ABLATION N/A 05/20/2018  ? Procedure: ATRIAL FIBRILLATION ABLATION;  Surgeon: Camnitz, Will  Martin, MD;  [facility];  Service: Cardiovascular;  Laterality: N/A;  ? ATRIAL FIBRILLATION ABLATION N/A 09/02/2019  ? Procedure: ATRIAL FIBRILLATION ABLATION;  Surgeon: Camnitz, Will Martin, MD;  [facility];  Service: Cardiovascular;  Laterality: N/A;  ? CARDIOVERSION N/A 11/01/2015  ? Procedure: CARDIOVERSION;  Surgeon: Kenneth C Hilty, MD;  Location: [facility] ENDOS[facility];  Service: Cardiovascular;  Laterality: N/A;  ? CARDIOVERSION N/A 11/23/2015  ? Procedure: CARDIOVERSION;  Surgeon: Will Martin Camnitz, MD;  [facility];  Service: Cardiovascular;  Laterality: N/A;  ? CARDIOVERSION N/A 10/12/2017  ? Procedure: CARDIOVERSION;  Surgeon: Hilty, Kenneth C, MD;  [facility];  Service: Cardiovascular;  Laterality: N/A;  ? CARDIOVERSION N/A 06/30/2018  ? Procedure: CARDIOVERSION;  Surgeon: Skains, Mark C, MD;  [facility]: [facility];  Service: Cardiovascular;  Laterality: N/A;  ? CARDIOVERSION N/A 09/22/2018  ? Procedure: CARDIOVERSION;  Surgeon: Turner, Traci R, MD;  [facility];  Service: Cardiovascular;  Laterality: N/A;  ? CARDIOVERSION N/A 07/15/2019  ? Procedure: CARDIOVERSION;  Surgeon: Christopher, Bridgette, MD;  [facility];  Service: Cardiovascular;  Laterality: N/A;  ? CARDIOVERSION N/A 12/07/2019  ? Procedure: CARDIOVERSION;  Surgeon: Robertson, Tiffany, MD;  [facility];  Service: Cardiovascular;  Laterality: N/A;  ? CARDIOVERSION N/A 03/19/2020  ? Procedure: CARDIOVERSION;  Surgeon: Turner, Traci R, MD;  [facility];  Service: Cardiovascular;  Laterality: N/A;  ? COLONOSCOPY    ? CORONARY STENT INTERVENTION N/A 02/24/2018  ? Procedure: CORONARY STENT INTERVENTION;  Surgeon: Cooper, Michael, MD;  Location:   [place] INVASIVE CV LAB;  Service: Cardiovascular;  Laterality: N/A;  ? I & D EXTREMITY Left 10/19/2019  ? Procedure: IRRIGATION AND DEBRIDEMENT LEFT SMALL PROXIMAL PHALANX;  Surgeon: Iran Planas, MD;  Location: [place];  Service: Orthopedics;  Laterality: Left;  ? INTRAVASCULAR PRESSURE WIRE/FFR STUDY N/A 02/24/2018  ? Procedure: INTRAVASCULAR PRESSURE WIRE/FFR STUDY;  Surgeon: Sherren Mocha, MD;  Location: [place] CV LAB;  Service: Cardiovascular;  Laterality: N/A;  ? KNEE ARTHROSCOPY  2000  ? LEFT HEART CATH AND CORONARY ANGIOGRAPHY N/A 02/24/2018  ? Procedure: LEFT HEART CATH AND CORONARY ANGIOGRAPHY;  Surgeon: Sherren Mocha, MD;  Location: [place] CV LAB;  Service: Cardiovascular;  Laterality: N/A;  ? OPEN REDUCTION INTERNAL FIXATION (ORIF) PROXIMAL PHALANX Left 10/19/2019  ? Procedure: OPEN REDUCTION INTERNAL FIXATION (ORIF) PROXIMAL PHALANX WITH K-WIRES LEFT SMALL;  Surgeon: Iran Planas, MD;  Location: [place];  Service: Orthopedics;  Laterality: Left;  ? SHOULDER ARTHROSCOPY WITH ROTATOR CUFF REPAIR AND SUBACROMIAL DECOMPRESSION Right 07/15/2012  ? Procedure: RIGHT ARTHROSCOPY SHOULDER DECOMPRESSION  SUBACROMIAL PARTIAL ACROMIOPLASTY WITH CORACOACROMIAL RELEASE,  DISTAL CLAVICULECTOMY, resect biceps debride labrium WITH ROTATOR CUFF REPAIR;  Surgeon: Ninetta Lights, MD;  Location: [place];  Service: Orthopedics;  Laterality: Right;  ? ? ?Current Medications: ?Current Meds  ?Medication Sig  ? acetaminophen (TYLENOL) 325 MG tablet Take 325 mg by mouth every 6 (six) hours as needed (for pain.).   ? amiodarone (PACERONE) 200 MG tablet Take 100 mg by mouth daily.  ? ASPIRIN LOW DOSE 81 MG chewable tablet Chew 81 mg by mouth every evening.   ? atorvastatin (LIPITOR) 40 MG tablet Take 1 tablet (40 mg total) by mouth daily. Please make overdue appt with Dr. Burt Knack before anymore refills. Thank you 3rd and Final Attempt  ? ELIQUIS 5 MG TABS tablet TAKE 1 TABLET(5 MG) BY MOUTH TWICE DAILY  ? furosemide (LASIX) 20 MG tablet Take 1-2 tablets (20-40 mg total) by mouth daily as needed for fluid.  ? losartan (COZAAR) 100 MG tablet Take 1 tablet (100 mg total) by mouth daily.   ? metoprolol succinate (TOPROL-XL) 25 MG 24 hr tablet TAKE 1 TABLET(25 MG) BY MOUTH DAILY WITH OR IMMEDIATELY FOLLOWING A MEAL  ? nitroGLYCERIN (NITROSTAT) 0.4 MG SL tablet PLACE 1 TABLET UNDER THE TONGUE AS NEEDED FOR CHEST PAIN EVERY 5 MINUTES AS NEEDED  ? zaleplon (SONATA) 5 MG capsule Take 5 mg by mouth at bedtime as needed for sleep.  ?  ? ?Allergies:   Patient has no known allergies.  ? ?Social History  ? ?Socioeconomic History  ? Marital status: Married  ?  Spouse name: Not on file  ? Number of children: Not on file  ? Years of education: Not on file  ? Highest education level: Not on file  ?Occupational History  ? Not on file  ?Tobacco Use  ? Smoking status: Former  ?  Packs/day: 2.00  ?  Years: 10.00  ?  Pack years: 20.00  ?  Types: Cigarettes  ?  Quit date: 07/09/1973  ?  Years since quitting: 48.0  ? Smokeless tobacco: Never  ?Vaping Use  ? Vaping Use: Never used  ?Substance and Sexual Activity  ? Alcohol use: Yes  ?  Alcohol/week: 2.0 standard drinks  ?  Types: 2 Glasses of wine per week  ? Drug use: No  ? Sexual activity: Not Currently  ?  Birth control/protection: Abstinence  ?Other Topics Concern  ? Not  on file  ?Social History Narrative  ? Right Handed   ? Lives in a two story home   ? ?Social Determinants of Health  ? ?Financial Resource Strain: Not on file  ?Food Insecurity: Not on file  ?Transportation Needs: Not on file  ?Physical Activity: Not on file  ?Stress: Not on file  ?Social Connections: Not on file  ?  ? ?Family History: ?The patient's family history includes Cancer in his sister; Clotting disorder in his mother; Heart disease in his brother; Heart failure in his father; Hypertension in his sister; Other in his mother. ? ?ROS:   ?Please see the history of present illness.    ?(+) Falls ?(+) Bruising of upper and lower extremities ?All other systems reviewed and are negative. ? ?EKGs/Labs/Other Studies Reviewed:   ? ?The following studies were reviewed today: ? ?Atrial Fibrillation  Ablation 09/02/2019: ?CONCLUSIONS: ?1. Atrial fibrillation upon presentation.   ?2. Successful electrical isolation and anatomical encircling of all four pulmonary veins with radiofrequency current.  A [facility] approa

## 2021-07-11 ENCOUNTER — Telehealth: Payer: Self-pay

## 2021-07-11 ENCOUNTER — Telehealth (HOSPITAL_COMMUNITY): Payer: Self-pay | Admitting: *Deleted

## 2021-07-11 NOTE — Telephone Encounter (Signed)
-----   Message from Lanier Prude, MD sent at 07/11/2021 12:37 PM EDT ----- ?Regarding: RE: Watchman CT 3/17 ?No, we don't need another CT. Thanks for sending this. ?Sheria Lang ?----- Message ----- ?From: Wendall Stade, MD ?Sent: 07/10/2021  11:22 AM EDT ?To: Henrietta Dine, RN, Lanier Prude, MD ?Subject: RE: Sharyne Peach CT 3/17                          ? ?I just sent you his analysis from an old CTA does he need another one ? ?----- Message ----- ?From: Henrietta Dine, RN ?Sent: 07/10/2021  10:54 AM EDT ?To: Wendall Stade, MD, Lennie Odor, RN, # ?Subject: Watchman CT 3/17                              ? ?Pre-Watchman CT scheduled 3/17 with Dr. Eden Emms to read  ?Dx: AF ?Labs are updated ? ?This patient replaced the patient with TAVR protocol to look at mitral valve (she is being referred to another facility). ? ?Thank you! ?KK ? ? ? ?

## 2021-07-11 NOTE — Telephone Encounter (Signed)
Reaching out to patient to offer assistance regarding upcoming cardiac imaging study; pt verbalizes understanding of appt date/time, parking situation and where to check in, pre-test NPO status and verified current allergies; name and call back number provided for further questions should they arise ? ?Larey Brick RN Navigator Cardiac Imaging ?[facility] ?[phone] office ?[phone] cell ? ?Patient aware to arrive at 7:15am for his 7:45am scan. ?

## 2021-07-11 NOTE — Telephone Encounter (Signed)
Per Dr. Quentin Ore, cancelled [place] CT. ?Dr. Johnsie Cancel was able to pull imaging and measurements from 2021 CT. ?The patient understands he will be called to arrange procedure. ?He was grateful for call and agrees with plan.  ?

## 2021-07-12 ENCOUNTER — Encounter (HOSPITAL_COMMUNITY): Payer: Self-pay

## 2021-07-12 ENCOUNTER — Ambulatory Visit (HOSPITAL_COMMUNITY): Admission: RE | Admit: 2021-07-12 | Payer: Medicare Other | Source: Ambulatory Visit

## 2021-07-14 LAB — COMPREHENSIVE METABOLIC PANEL
ALT: 15 IU/L (ref 0–44)
AST: 17 IU/L (ref 0–40)
Albumin/Globulin Ratio: 1.6 (ref 1.2–2.2)
Albumin: 4.1 g/dL (ref 3.7–4.7)
Alkaline Phosphatase: 96 IU/L (ref 44–121)
BUN/Creatinine Ratio: 18 (ref 10–24)
BUN: 19 mg/dL (ref 8–27)
Bilirubin Total: 0.4 mg/dL (ref 0.0–1.2)
Calcium: 9.1 mg/dL (ref 8.6–10.2)
Chloride: 105 mmol/L (ref 96–106)
Creatinine, Ser: 1.06 mg/dL (ref 0.76–1.27)
Globulin, Total: 2.6 g/dL (ref 1.5–4.5)
Glucose: 97 mg/dL (ref 70–99)
Potassium: 5 mmol/L (ref 3.5–5.2)
Sodium: 138 mmol/L (ref 134–144)
Total Protein: 6.7 g/dL (ref 6.0–8.5)
eGFR: 73 mL/min/{1.73_m2} (ref >59)

## 2021-07-14 LAB — CBC
Hematocrit: 43.5 % (ref 37.5–51.0)
Hemoglobin: 15 g/dL (ref 13.0–17.7)
MCH: 32.1 pg (ref 26.6–33.0)
MCHC: 34.5 g/dL (ref 31.5–35.7)
MCV: 93 fL (ref 79–97)
Platelets: 197 10*3/uL (ref 150–450)
RBC: 4.68 x10E6/uL (ref 4.14–5.80)
RDW: 13.1 % (ref 11.6–15.4)
WBC: 6.7 10*3/uL (ref 3.4–10.8)

## 2021-07-14 LAB — TSH: TSH: 3.23 u[IU]/mL (ref 0.450–4.500)

## 2021-07-17 ENCOUNTER — Other Ambulatory Visit: Payer: Self-pay

## 2021-07-17 DIAGNOSIS — I4819 Other persistent atrial fibrillation: Secondary | ICD-10-CM

## 2021-08-02 ENCOUNTER — Telehealth: Payer: Self-pay

## 2021-08-02 ENCOUNTER — Other Ambulatory Visit: Payer: Medicare Other | Admitting: *Deleted

## 2021-08-02 DIAGNOSIS — I4819 Other persistent atrial fibrillation: Secondary | ICD-10-CM

## 2021-08-02 LAB — BASIC METABOLIC PANEL
BUN/Creatinine Ratio: 20 (ref 10–24)
BUN: 22 mg/dL (ref 8–27)
CO2: 23 mmol/L (ref 20–29)
Calcium: 8.7 mg/dL (ref 8.6–10.2)
Chloride: 107 mmol/L — ABNORMAL HIGH (ref 96–106)
Creatinine, Ser: 1.09 mg/dL (ref 0.76–1.27)
Glucose: 91 mg/dL (ref 70–99)
Potassium: 5 mmol/L (ref 3.5–5.2)
Sodium: 145 mmol/L — ABNORMAL HIGH (ref 134–144)
eGFR: 70 mL/min/{1.73_m2} (ref >59)

## 2021-08-02 LAB — CBC WITH DIFFERENTIAL/PLATELET
Basophils Absolute: 0 10*3/uL (ref 0.0–0.2)
Basos: 1 %
EOS (ABSOLUTE): 0 10*3/uL (ref 0.0–0.4)
Eos: 1 %
Hematocrit: 43.7 % (ref 37.5–51.0)
Hemoglobin: 14.5 g/dL (ref 13.0–17.7)
Immature Grans (Abs): 0 10*3/uL (ref 0.0–0.1)
Immature Granulocytes: 0 %
Lymphocytes Absolute: 1.4 10*3/uL (ref 0.7–3.1)
Lymphs: 24 %
MCH: 31.3 pg (ref 26.6–33.0)
MCHC: 33.2 g/dL (ref 31.5–35.7)
MCV: 94 fL (ref 79–97)
Monocytes Absolute: 0.7 10*3/uL (ref 0.1–0.9)
Monocytes: 11 %
Neutrophils Absolute: 3.8 10*3/uL (ref 1.4–7.0)
Neutrophils: 63 %
Platelets: 174 10*3/uL (ref 150–450)
RBC: 4.63 x10E6/uL (ref 4.14–5.80)
RDW: 13.2 % (ref 11.6–15.4)
WBC: 5.9 10*3/uL (ref 3.4–10.8)

## 2021-08-02 NOTE — Telephone Encounter (Signed)
Confirmed arrival time of 0515 for 0730 Watchman procedure on 4/13. ?The patient has no other questions/concerns. ?He will call if any arise. ?

## 2021-08-07 ENCOUNTER — Encounter (HOSPITAL_COMMUNITY): Payer: Self-pay | Admitting: Cardiology

## 2021-08-07 NOTE — Anesthesia Preprocedure Evaluation (Addendum)
Anesthesia Evaluation  ?Patient identified by MRN, date of birth, ID band ?Patient awake ? ? ? ?Reviewed: ?Allergy & Precautions, NPO status , Patient's Chart, lab work & pertinent test results, reviewed documented beta blocker date and time  ? ?History of Anesthesia Complications ?(+) history of anesthetic complications ? ?Airway ?Mallampati: III ? ?TM Distance: >3 FB ?Neck ROM: Full ? ? ? Dental ?no notable dental hx. ?(+) Teeth Intact, Dental Advisory Given, Caps ?  ?Pulmonary ?sleep apnea and Continuous Positive Airway Pressure Ventilation , former smoker,  ?  ?Pulmonary exam normal ?breath sounds clear to auscultation ? ? ? ? ? ? Cardiovascular ?hypertension, Pt. on medications and Pt. on home beta blockers ?+ CAD and + Cardiac Stents  ?Normal cardiovascular exam+ dysrhythmias Atrial Fibrillation  ?Rhythm:Irregular Rate:Bradycardia ? ?Cardiac cath 02/24/2018 ? Mid RCA lesion is 80% stenosed. ? Prox RCA lesion is 30% stenosed. ? Ost Cx to Prox Cx lesion is 95% stenosed. ? Prox LAD lesion is 50% stenosed. ? Prox LAD to Mid LAD lesion is 50% stenosed. ? A drug-eluting stent was successfully placed using a STENT SIERRA 3.50 X 15 MM. ? Post intervention, there is a 0% residual stenosis. ? A drug-eluting stent was successfully placed using a STENT SIERRA 4.00 X 15 MM. ?? Post intervention, there is a 0% residual stenosis. ?  ?1.  Mild to moderate diffuse LAD stenosis with borderline DFR of 0.89 ?2.  Severe proximal circumflex stenosis treated successfully with a 3.5 x 15 mm Xience Sierra DES ?3.  Severe mid RCA stenosis with heavy calcification, treated successfully with noncompliant balloon angioplasty and stenting with a 4.0 x 15 mm Xience Sierra DES ?4.  Multivessel DFR analysis of the RCA and LAD as detailed above ?? ? ?  ?Neuro/Psych ?negative neurological ROS ? negative psych ROS  ? GI/Hepatic ?negative GI ROS, Neg liver ROS,   ?Endo/Other  ?Morbid obesityHyperlipidemia ? ?  Renal/GU ?negative Renal ROS  ?negative genitourinary ?  ?Musculoskeletal ? ?(+) Arthritis , Osteoarthritis,   ? Abdominal ?(+) + obese,   ?Peds ? Hematology ?Eliquis therapy- last dose yesterday am   ?Anesthesia Other Findings ? ? Reproductive/Obstetrics ? ?  ? ? ? ? ? ? ? ? ? ? ? ? ? ?  ?  ? ? ? ? ? ?Anesthesia Physical ?Anesthesia Plan ? ?ASA: 3 ? ?Anesthesia Plan: General  ? ?Post-op Pain Management:   ? ?Induction: Intravenous ? ?PONV Risk Score and Plan: 2 and Treatment may vary due to age or medical condition and Ondansetron ? ?Airway Management Planned: Oral ETT ? ?Additional Equipment: None ? ?Intra-op Plan:  ? ?Post-operative Plan: Extubation in OR ? ?Informed Consent: I have reviewed the patients History and Physical, chart, labs and discussed the procedure including the risks, benefits and alternatives for the proposed anesthesia with the patient or authorized representative who has indicated his/her understanding and acceptance.  ? ? ? ?Dental advisory given ? ?Plan Discussed with: CRNA and Anesthesiologist ? ?Anesthesia Plan Comments: (May need glidescope)  ? ? ? ? ?Anesthesia Quick Evaluation ? ?

## 2021-08-08 ENCOUNTER — Inpatient Hospital Stay (HOSPITAL_COMMUNITY): Payer: Medicare Other

## 2021-08-08 ENCOUNTER — Encounter (HOSPITAL_COMMUNITY)
Admission: RE | Disposition: A | Discharge status: Home / Self Care | Payer: Medicare Other | Source: Home / Self Care | Attending: Cardiology

## 2021-08-08 ENCOUNTER — Inpatient Hospital Stay (HOSPITAL_COMMUNITY): Payer: Medicare Other | Admitting: Certified Registered"

## 2021-08-08 ENCOUNTER — Inpatient Hospital Stay (HOSPITAL_COMMUNITY)
Admission: RE | Admit: 2021-08-08 | Discharge: 2021-08-09 | DRG: 274 | Disposition: A | Discharge status: Home / Self Care | Payer: Medicare Other | Attending: Cardiology | Admitting: Cardiology

## 2021-08-08 ENCOUNTER — Other Ambulatory Visit: Payer: Self-pay | Admitting: Cardiology

## 2021-08-08 ENCOUNTER — Other Ambulatory Visit: Payer: Self-pay

## 2021-08-08 ENCOUNTER — Encounter (HOSPITAL_COMMUNITY): Payer: Self-pay | Admitting: Cardiology

## 2021-08-08 DIAGNOSIS — I10 Essential (primary) hypertension: Secondary | ICD-10-CM | POA: Diagnosis present

## 2021-08-08 DIAGNOSIS — D6869 Other thrombophilia: Secondary | ICD-10-CM | POA: Diagnosis present

## 2021-08-08 DIAGNOSIS — Z8249 Family history of ischemic heart disease and other diseases of the circulatory system: Secondary | ICD-10-CM

## 2021-08-08 DIAGNOSIS — I2510 Atherosclerotic heart disease of native coronary artery without angina pectoris: Secondary | ICD-10-CM

## 2021-08-08 DIAGNOSIS — I4891 Unspecified atrial fibrillation: Secondary | ICD-10-CM | POA: Diagnosis not present

## 2021-08-08 DIAGNOSIS — Z955 Presence of coronary angioplasty implant and graft: Secondary | ICD-10-CM | POA: Diagnosis not present

## 2021-08-08 DIAGNOSIS — Z7982 Long term (current) use of aspirin: Secondary | ICD-10-CM | POA: Diagnosis not present

## 2021-08-08 DIAGNOSIS — E785 Hyperlipidemia, unspecified: Secondary | ICD-10-CM | POA: Diagnosis present

## 2021-08-08 DIAGNOSIS — Z7985 Long-term (current) use of injectable non-insulin antidiabetic drugs: Secondary | ICD-10-CM

## 2021-08-08 DIAGNOSIS — I440 Atrioventricular block, first degree: Secondary | ICD-10-CM | POA: Diagnosis present

## 2021-08-08 DIAGNOSIS — I4819 Other persistent atrial fibrillation: Secondary | ICD-10-CM | POA: Diagnosis present

## 2021-08-08 DIAGNOSIS — M47814 Spondylosis without myelopathy or radiculopathy, thoracic region: Secondary | ICD-10-CM | POA: Diagnosis not present

## 2021-08-08 DIAGNOSIS — Z7901 Long term (current) use of anticoagulants: Secondary | ICD-10-CM | POA: Diagnosis not present

## 2021-08-08 DIAGNOSIS — R918 Other nonspecific abnormal finding of lung field: Secondary | ICD-10-CM | POA: Diagnosis present

## 2021-08-08 DIAGNOSIS — Z79899 Other long term (current) drug therapy: Secondary | ICD-10-CM | POA: Diagnosis not present

## 2021-08-08 DIAGNOSIS — R296 Repeated falls: Secondary | ICD-10-CM | POA: Diagnosis present

## 2021-08-08 DIAGNOSIS — Z832 Family history of diseases of the blood and blood-forming organs and certain disorders involving the immune mechanism: Secondary | ICD-10-CM

## 2021-08-08 DIAGNOSIS — G4730 Sleep apnea, unspecified: Secondary | ICD-10-CM | POA: Diagnosis present

## 2021-08-08 DIAGNOSIS — Z87891 Personal history of nicotine dependence: Secondary | ICD-10-CM

## 2021-08-08 DIAGNOSIS — Z006 Encounter for examination for normal comparison and control in clinical research program: Secondary | ICD-10-CM

## 2021-08-08 DIAGNOSIS — Z01818 Encounter for other preprocedural examination: Secondary | ICD-10-CM | POA: Diagnosis not present

## 2021-08-08 DIAGNOSIS — Z95818 Presence of other cardiac implants and grafts: Principal | ICD-10-CM

## 2021-08-08 DIAGNOSIS — I088 Other rheumatic multiple valve diseases: Secondary | ICD-10-CM | POA: Diagnosis not present

## 2021-08-08 HISTORY — PX: TEE WITHOUT CARDIOVERSION: SHX5443

## 2021-08-08 HISTORY — DX: Presence of other cardiac implants and grafts: Z95.818

## 2021-08-08 HISTORY — PX: LEFT ATRIAL APPENDAGE OCCLUSION: EP1229

## 2021-08-08 LAB — TYPE AND SCREEN
ABO/RH(D): A NEG
Antibody Screen: NEGATIVE

## 2021-08-08 LAB — POCT I-STAT 7, (LYTES, BLD GAS, ICA,H+H)
Acid-base deficit: 1 mmol/L (ref 0.0–2.0)
Bicarbonate: 25.4 mmol/L (ref 20.0–28.0)
Calcium, Ion: 1.19 mmol/L (ref 1.15–1.40)
HCT: 38 % — ABNORMAL LOW (ref 39.0–52.0)
Hemoglobin: 12.9 g/dL — ABNORMAL LOW (ref 13.0–17.0)
O2 Saturation: 100 %
Potassium: 4.2 mmol/L (ref 3.5–5.1)
Sodium: 139 mmol/L (ref 135–145)
TCO2: 27 mmol/L (ref 22–32)
pCO2 arterial: 46.4 mmHg (ref 32–48)
pH, Arterial: 7.346 — ABNORMAL LOW (ref 7.35–7.45)
pO2, Arterial: 254 mmHg — ABNORMAL HIGH (ref 83–108)

## 2021-08-08 LAB — ECHO TEE
AR max vel: 2.29 cm2
AV Area VTI: 1.99 cm2
AV Area mean vel: 2.05 cm2
AV Mean grad: 6 mmHg
AV Peak grad: 9 mmHg
Ao pk vel: 1.5 m/s
P 1/2 time: 812 msec

## 2021-08-08 LAB — POCT ACTIVATED CLOTTING TIME: Activated Clotting Time: 299 seconds

## 2021-08-08 LAB — SURGICAL PCR SCREEN
MRSA, PCR: NEGATIVE
Staphylococcus aureus: NEGATIVE

## 2021-08-08 LAB — ABO/RH: ABO/RH(D): A NEG

## 2021-08-08 SURGERY — LEFT ATRIAL APPENDAGE OCCLUSION
Anesthesia: General

## 2021-08-08 MED ORDER — SODIUM CHLORIDE 0.9% FLUSH
3.0000 mL | Freq: Two times a day (BID) | INTRAVENOUS | Status: DC
  Administered 2021-08-08 (×2): 3 mL via INTRAVENOUS

## 2021-08-08 MED ORDER — LIDOCAINE 2% (20 MG/ML) 5 ML SYRINGE
INTRAMUSCULAR | Status: DC | PRN
  Administered 2021-08-08: 80 mg via INTRAVENOUS

## 2021-08-08 MED ORDER — DEXAMETHASONE SODIUM PHOSPHATE 10 MG/ML IJ SOLN
INTRAMUSCULAR | Status: DC | PRN
  Administered 2021-08-08: 5 mg via INTRAVENOUS

## 2021-08-08 MED ORDER — ORAL CARE MOUTH RINSE
15.0000 mL | Freq: Two times a day (BID) | OROMUCOSAL | Status: DC
  Administered 2021-08-08 (×2): 15 mL via OROMUCOSAL

## 2021-08-08 MED ORDER — ONDANSETRON HCL 4 MG/2ML IJ SOLN: 4.0000 mg | Freq: Four times a day (QID) | INTRAMUSCULAR | Status: DC | PRN

## 2021-08-08 MED ORDER — HEPARIN (PORCINE) IN NACL 2000-0.9 UNIT/L-% IV SOLN
INTRAVENOUS | Status: AC
  Filled 2021-08-08: qty 1000

## 2021-08-08 MED ORDER — CHLORHEXIDINE GLUCONATE 0.12 % MT SOLN: 15.0000 mL | Freq: Once | OROMUCOSAL | Status: AC

## 2021-08-08 MED ORDER — SODIUM CHLORIDE 0.9 % IV SOLN: INTRAVENOUS | Status: DC

## 2021-08-08 MED ORDER — LACTATED RINGERS IV SOLN: INTRAVENOUS | Status: DC

## 2021-08-08 MED ORDER — ROCURONIUM BROMIDE 10 MG/ML (PF) SYRINGE
PREFILLED_SYRINGE | INTRAVENOUS | Status: DC | PRN
Start: 2021-08-08 — End: 2021-08-08
  Administered 2021-08-08: 70 mg via INTRAVENOUS

## 2021-08-08 MED ORDER — LOSARTAN POTASSIUM 50 MG PO TABS
100.0000 mg | ORAL_TABLET | Freq: Every day | ORAL | Status: DC
  Filled 2021-08-08: qty 2

## 2021-08-08 MED ORDER — PROPOFOL 10 MG/ML IV BOLUS
INTRAVENOUS | Status: DC | PRN
  Administered 2021-08-08: 120 mg via INTRAVENOUS

## 2021-08-08 MED ORDER — SODIUM CHLORIDE 0.9 % IV SOLN: 250.0000 mL | INTRAVENOUS | Status: DC | PRN

## 2021-08-08 MED ORDER — FENTANYL CITRATE (PF) 100 MCG/2ML IJ SOLN
INTRAMUSCULAR | Status: DC | PRN
Start: 2021-08-08 — End: 2021-08-08
  Administered 2021-08-08: 100 ug via INTRAVENOUS

## 2021-08-08 MED ORDER — SUGAMMADEX SODIUM 200 MG/2ML IV SOLN
INTRAVENOUS | Status: DC | PRN
Start: 2021-08-08 — End: 2021-08-08
  Administered 2021-08-08: 400 mg via INTRAVENOUS

## 2021-08-08 MED ORDER — SODIUM CHLORIDE 0.9% FLUSH: 3.0000 mL | INTRAVENOUS | Status: DC | PRN

## 2021-08-08 MED ORDER — HEPARIN (PORCINE) IN NACL 1000-0.9 UT/500ML-% IV SOLN
INTRAVENOUS | Status: DC | PRN
Start: 2021-08-08 — End: 2021-08-08
  Administered 2021-08-08: 500 mL

## 2021-08-08 MED ORDER — CEFAZOLIN IN SODIUM CHLORIDE 3-0.9 GM/100ML-% IV SOLN
3.0000 g | INTRAVENOUS | Status: AC
  Administered 2021-08-08: 3 g via INTRAVENOUS

## 2021-08-08 MED ORDER — CEFAZOLIN IN SODIUM CHLORIDE 3-0.9 GM/100ML-% IV SOLN
INTRAVENOUS | Status: AC
  Filled 2021-08-08: qty 100

## 2021-08-08 MED ORDER — ONDANSETRON HCL 4 MG/2ML IJ SOLN
INTRAMUSCULAR | Status: DC | PRN
  Administered 2021-08-08: 4 mg via INTRAVENOUS

## 2021-08-08 MED ORDER — ACETAMINOPHEN 325 MG PO TABS: 325.0000 mg | ORAL_TABLET | Freq: Four times a day (QID) | ORAL | Status: DC | PRN

## 2021-08-08 MED ORDER — EPHEDRINE SULFATE-NACL 50-0.9 MG/10ML-% IV SOSY
PREFILLED_SYRINGE | INTRAVENOUS | Status: DC | PRN
  Administered 2021-08-08: 5 mg via INTRAVENOUS
  Administered 2021-08-08 (×2): 2.5 mg via INTRAVENOUS

## 2021-08-08 MED ORDER — ATORVASTATIN CALCIUM 40 MG PO TABS
40.0000 mg | ORAL_TABLET | Freq: Every day | ORAL | Status: DC
  Administered 2021-08-08 – 2021-08-09 (×2): 40 mg via ORAL
  Filled 2021-08-08 (×2): qty 1

## 2021-08-08 MED ORDER — MIDAZOLAM HCL 2 MG/2ML IJ SOLN
INTRAMUSCULAR | Status: DC | PRN
  Administered 2021-08-08: 2 mg via INTRAVENOUS

## 2021-08-08 MED ORDER — APIXABAN 5 MG PO TABS
5.0000 mg | ORAL_TABLET | Freq: Two times a day (BID) | ORAL | Status: DC
  Administered 2021-08-08: 5 mg via ORAL
  Filled 2021-08-08 (×2): qty 1

## 2021-08-08 MED ORDER — HEPARIN SODIUM (PORCINE) 1000 UNIT/ML IJ SOLN
INTRAMUSCULAR | Status: DC | PRN
  Administered 2021-08-08: 18000 [IU] via INTRAVENOUS

## 2021-08-08 MED ORDER — HEPARIN (PORCINE) IN NACL 2000-0.9 UNIT/L-% IV SOLN
INTRAVENOUS | Status: DC | PRN
  Administered 2021-08-08: 1000 mL

## 2021-08-08 MED ORDER — METOPROLOL SUCCINATE ER 25 MG PO TB24
25.0000 mg | ORAL_TABLET | Freq: Every day | ORAL | Status: DC
  Administered 2021-08-09: 25 mg via ORAL
  Filled 2021-08-08: qty 1

## 2021-08-08 MED ORDER — ACETAMINOPHEN 325 MG PO TABS: 650.0000 mg | ORAL_TABLET | ORAL | Status: DC | PRN

## 2021-08-08 MED ORDER — PROTAMINE SULFATE 10 MG/ML IV SOLN
INTRAVENOUS | Status: DC | PRN
  Administered 2021-08-08: 30 mg via INTRAVENOUS

## 2021-08-08 MED ORDER — IOHEXOL 350 MG/ML SOLN
INTRAVENOUS | Status: DC | PRN
  Administered 2021-08-08 (×3): 10 mL

## 2021-08-08 MED ORDER — AMIODARONE HCL 100 MG PO TABS
100.0000 mg | ORAL_TABLET | Freq: Every day | ORAL | Status: DC
  Administered 2021-08-08 – 2021-08-09 (×2): 100 mg via ORAL
  Filled 2021-08-08 (×2): qty 1

## 2021-08-08 MED ORDER — CHLORHEXIDINE GLUCONATE 0.12 % MT SOLN
OROMUCOSAL | Status: AC
  Administered 2021-08-08: 15 mL via OROMUCOSAL
  Filled 2021-08-08: qty 15

## 2021-08-08 SURGICAL SUPPLY — 17 items
CATH INFINITI 5FR ANG PIGTAIL (CATHETERS) ×1 IMPLANT
CLOSURE PERCLOSE PROSTYLE (VASCULAR PRODUCTS) ×2 IMPLANT
DEVICE WATCHMAN FLX PROC (KITS) IMPLANT
DILATOR VESSEL 38 20CM 11FR (INTRODUCER) ×1 IMPLANT
KIT HEART LEFT (KITS) ×2 IMPLANT
KIT SHEA VERSACROSS LAAC CONNE (KITS) ×1 IMPLANT
PACK CARDIAC CATHETERIZATION (CUSTOM PROCEDURE TRAY) ×2 IMPLANT
PAD DEFIB RADIO PHYSIO CONN (PAD) ×2 IMPLANT
SHEATH PERFORMER 16FR 30 (SHEATH) ×1 IMPLANT
SHEATH PINNACLE 8F 10CM (SHEATH) ×1 IMPLANT
SHEATH PROBE COVER 6X72 (BAG) ×2 IMPLANT
TRANSDUCER W/STOPCOCK (MISCELLANEOUS) ×2 IMPLANT
TUBING CIL FLEX 10 FLL-RA (TUBING) ×2 IMPLANT
WATCHMAN FLX 27 (Prosthesis & Implant Heart) ×1 IMPLANT
WATCHMAN FLX PROCEDURE DEVICE (KITS) ×2 IMPLANT
WATCHMAN PROCED TRUSEAL ACCESS (SHEATH) ×1 IMPLANT
WATCHMAN TRUSEAL DOUBLE CURVE (SHEATH) ×1 IMPLANT

## 2021-08-08 NOTE — TOC Progression Note (Signed)
Transition of Care (TOC) - Progression Note  ? ? ?Patient Details  ?Name: Ronald Hull ?MRN: [id number] ?Date of Birth: 1946/03/09 ? ?Transition of Care (TOC) CM/SW Contact  ?Beckie Busing, RN ?Phone Number:[phone] ? ?08/08/2021, 2:36 PM ? ?Clinical Narrative:    ? ?Transition of Care (TOC) Screening Note ? ? ?Patient Details  ?Name: Ronald Hull ?Date of Birth: May 23, 1945 ? ? ?Transition of Care (TOC) CM/SW Contact:    ?Beckie Busing, RN ?Phone Number: ?08/08/2021, 2:36 PM ? ? ? ?Transition of Care Department [facility]) has reviewed patient and no TOC needs have been identified at this time. We will continue to monitor patient advancement through interdisciplinary progression rounds. If new patient transition needs arise, please place a TOC consult. ? ? ? ? ?  ?  ? ?Expected Discharge Plan and Services ?  ?  ?  ?  ?  ?                ?  ?  ?  ?  ?  ?  ?  ?  ?  ?  ? ? ?Social Determinants of Health (SDOH) Interventions ?  ? ?Readmission Risk Interventions ?   ? View : No data to display.  ?  ?  ?  ? ? ?

## 2021-08-08 NOTE — Anesthesia Postprocedure Evaluation (Signed)
Anesthesia Post Note ? ?Patient: Ronald Hull ? ?Procedure(s) Performed: LEFT ATRIAL APPENDAGE OCCLUSION ?TRANSESOPHAGEAL ECHOCARDIOGRAM (TEE) ? ?  ? ?Patient location during evaluation: PACU ?Anesthesia Type: General ?Level of consciousness: awake and alert and oriented ?Pain management: pain level controlled ?Vital Signs Assessment: post-procedure vital signs reviewed and stable ?Respiratory status: spontaneous breathing, nonlabored ventilation and respiratory function stable ?Cardiovascular status: blood pressure returned to baseline and stable ?Postop Assessment: no apparent nausea or vomiting ?Anesthetic complications: no ? ? ?There were no known notable events for this encounter. ? ?Last Vitals:  ?Vitals:  ? 08/08/21 1013 08/08/21 1035  ?BP:  (!) 150/62  ?Pulse:  (!) 56  ?Resp:    ?Temp: (!) 36.4 ?C   ?SpO2: 98%   ?  ?Last Pain:  ?Vitals:  ? 08/08/21 1013  ?TempSrc: Axillary  ?PainSc:   ? ? ?  ?  ?  ?  ?  ?  ? ?Alicea Wente A. ? ? ? ? ?

## 2021-08-08 NOTE — Transfer of Care (Signed)
Immediate Anesthesia Transfer of Care Note ? ?Patient: Ronald Hull ? ?Procedure(s) Performed: LEFT ATRIAL APPENDAGE OCCLUSION ?TRANSESOPHAGEAL ECHOCARDIOGRAM (TEE) ? ?Patient Location: Cath Lab ? ?Anesthesia Type:General ? ?Level of Consciousness: drowsy and patient cooperative ? ?Airway & Oxygen Therapy: Patient Spontanous Breathing and Patient connected to nasal cannula oxygen ? ?Post-op Assessment: Report given to RN, Post -op Vital signs reviewed and stable and Patient moving all extremities ? ?Post vital signs: Reviewed and stable ? ?Last Vitals:  ?Vitals Value Taken Time  ?BP 154/60 08/08/21 0915  ?Temp    ?Pulse 60 08/08/21 0919  ?Resp 22 08/08/21 0919  ?SpO2 99 % 08/08/21 0919  ?Vitals shown include unvalidated device data. ? ?Last Pain:  ?Vitals:  ? 08/08/21 0919  ?TempSrc:   ?PainSc: 0-No pain  ?   ? ?  ? ?Complications: There were no known notable events for this encounter. ?

## 2021-08-08 NOTE — Progress Notes (Signed)
?  Echocardiogram ?Echocardiogram Transesophageal has been performed. ? Jarome Matin ?08/08/2021, 9:23 AM ?

## 2021-08-08 NOTE — Progress Notes (Signed)
?  [facility]   ? ?Patient doing well s/p Watchman implant. He is hemodynamically stable. Groin site is stable. Plan for early ambulation after bedrest completed and hopeful discharge over the next 24 hours.  ? ?Linsay Vogt NP-C ?Structural Heart Team  ?Pager: [phone] ?Phone: [phone] ? ?

## 2021-08-08 NOTE — Anesthesia Procedure Notes (Signed)
Procedure Name: Intubation ?Date/Time: 08/08/2021 8:00 AM ?Performed by: Moshe Salisbury, CRNA ?Pre-anesthesia Checklist: Patient identified, Emergency Drugs available, Suction available and Patient being monitored ?Patient Re-evaluated:Patient Re-evaluated prior to induction ?Oxygen Delivery Method: Circle System Utilized ?Preoxygenation: Pre-oxygenation with 100% oxygen ?Induction Type: IV induction ?Ventilation: Mask ventilation without difficulty ?Laryngoscope Size: Mac and 4 ?Grade View: Grade II ?Tube type: Oral ?Tube size: 8.0 mm ?Number of attempts: 1 ?Airway Equipment and Method: Stylet ?Placement Confirmation: ETT inserted through vocal cords under direct vision, positive ETCO2 and breath sounds checked- equal and bilateral ?Secured at: 23 cm ?Tube secured with: Tape ?Dental Injury: Teeth and Oropharynx as per pre-operative assessment  ? ? ? ? ?

## 2021-08-08 NOTE — Discharge Summary (Signed)
?  ? ?[facility]   ? ?Patient ID: Ronald Hull,  ?MRN: [id number], DOB/AGE: Jul 01, 1945 76 y.o. ? ?Admit date: 08/08/2021 ?Discharge date: 08/09/2021 ? ?Primary Care Physician: Seward Carol, MD  ?Primary Cardiologist: Sherren Mocha, MD  ?Electrophysiologist: Will Meredith Leeds, MD ? ?Primary Discharge Diagnosis:  ?Persistent Atrial Fibrillation ?Poor candidacy for long term anticoagulation due to refusal of long-term oral anticoagulation due to frequent falling.  ? ?Secondary Discharge Diagnosis:  ?CAD ?HTN ?HLD ?Sleep apnea on CPAP ? ?Procedures This Admission:  ? ?CONCLUSIONS:  ?1.Successful implantation of a WATCHMAN left atrial appendage occlusive device    ?2. TEE demonstrating no LAA thrombus ?3. No early apparent complications.  ?  ?Post Implant Anticoagulation Strategy: ?Continue Eliquis 30m PO BID x 45 days. If 45-day follow up TEE criteria are met, plan to discontinue Eliquis and start Plavix 732mPO daily to complete 6 months of post implant therapy. ? ?Brief HPI: ?Ronald Hull a [age].o. male with a history of persistent atrial fibrillation, HTN, HLD, CAD, hx of lung nodules, and sleep apnea on CPAP. Mr. HoHattabaughresented to his primary physician in 2017 with irregular and elevated heart rates found to have AF. He underwent DCCV to NSR and was placed on amiodarone but was transitioned to Multaq. He unfortunately went back into atrial fibrillation with subsequent repeat cardioversion in 2019. He then had a pre ablation CT which showed significant coronary artery disease and he is now s/p DES to the RCA. He underwent atrial fibrillation ablation 04/2018 with repeat ablation 09/12/2019. He unfortunately has continued to have episodes of atrial fibrillation despite amiodarone. He was seen by Dr. CaCurt Bears/9/23 and wished to be off his Eliquis due to increased bruising and frequent falls. He was then referred to Dr. LaQuentin Oreor the evaluation of possible Watchman implant.  ?  ?Hospital  Course:  ?The patient was admitted and underwent left atrial appendage occlusive device placement with Watchman FLX 2738mAAO closure device without complication. TEE with no LAA thrombus. ?He was monitored on telemetry overnight which demonstrated NSR/SB with 1st degree AV block. Groin site was without complication on the day of discharge. The patient was examined and considered to be stable for discharge.  Wound care and restrictions were reviewed with the patient. The patient has been scheduled for post procedure follow up with JilKathyrn DrownP in 1 month. A repeat TEE at approximately 45 days will be performed to ensure proper seal of the device.  ? ?Medication plan: Continue Eliquis 5mg46m BID x 45 days. If 45-day follow up TEE criteria are met, plan to discontinue Eliquis and start Plavix 75mg75mdaily to complete 6 months of post implant therapy. ?6 months SBE discussed and will be RX'ed at follow up with myself.  ? ?Physical Exam: ?Vitals:  ? 08/08/21 1913 08/08/21 2304 08/09/21 0331 08/09/21 0745  ?BP: 135/72 124/64 132/74 126/62  ?Pulse: 62 (!) 54 (!) 52   ?Resp: _0 ?Temp: 97.8 ?F (36.6 ?C) 97.7 ?F (36.5 ?C) 97.6 ?F (36.4 ?C) 97.8 ?F (36.6 ?C)  ?TempSrc: Oral Oral Oral Oral  ?SpO2: 93% 92% 93% 97%  ?Weight:      ?Height:      ? ?General: Well developed, well nourished, NAD ?Lungs:Clear to ausculation bilaterally. No wheezes, rales, or rhonchi. Breathing is unlabored. ?Cardiovascular: RRR with S1 S2. No murmurs ?Extremities: No edema.  ?Neuro: Alert and oriented. No focal deficits. No facial asymmetry. MAE spontaneously. ?Psych: Responds  to questions appropriately with normal affect.   ? ?Labs: ?  ?Lab Results  ?Component Value Date  ? WBC 5.9 08/02/2021  ? HGB 12.9 (L) 08/08/2021  ? HCT 38.0 (L) 08/08/2021  ? MCV 94 08/02/2021  ? PLT 174 08/02/2021  ?  ?Recent Labs  ?Lab 08/09/21 ?9150  ?NA 134*  ?K 5.3*  ?CL 107  ?CO2 21*  ?BUN 17  ?CREATININE 1.13  ?CALCIUM 8.1*  ?GLUCOSE 145*  ? ? ?Discharge  Medications:  ?Allergies as of 08/09/2021   ?No Known Allergies ?  ? ?  ?Medication List  ?  ? ?STOP taking these medications   ? ?Aspirin Low Dose 81 MG chewable tablet ?Generic drug: aspirin ?  ? ?  ? ?TAKE these medications   ? ?acetaminophen 325 MG tablet ?Commonly known as: TYLENOL ?Take 325 mg by mouth every 6 (six) hours as needed (for pain.). ?  ?amiodarone 200 MG tablet ?Commonly known as: PACERONE ?Take 100 mg by mouth daily. ?  ?atorvastatin 40 MG tablet ?Commonly known as: LIPITOR ?Take 1 tablet (40 mg total) by mouth daily. Please make overdue appt with Dr. Burt Knack before anymore refills. Thank you 3rd and Final Attempt ?  ?Eliquis 5 MG Tabs tablet ?Generic drug: apixaban ?TAKE 1 TABLET(5 MG) BY MOUTH TWICE DAILY ?What changed: See the new instructions. ?  ?furosemide 20 MG tablet ?Commonly known as: Lasix ?Take 1-2 tablets (20-40 mg total) by mouth daily as needed for fluid. ?What changed: how much to take ?  ?losartan 100 MG tablet ?Commonly known as: COZAAR ?Take 1 tablet (100 mg total) by mouth daily. ?  ?metoprolol succinate 25 MG 24 hr tablet ?Commonly known as: TOPROL-XL ?TAKE 1 TABLET(25 MG) BY MOUTH DAILY WITH OR IMMEDIATELY FOLLOWING A MEAL ?What changed: See the new instructions. ?  ?nitroGLYCERIN 0.4 MG SL tablet ?Commonly known as: NITROSTAT ?PLACE 1 TABLET UNDER THE TONGUE AS NEEDED FOR CHEST PAIN EVERY 5 MINUTES AS NEEDED ?What changed: See the new instructions. ?  ?zaleplon 5 MG capsule ?Commonly known as: SONATA ?Take 5 mg by mouth at bedtime as needed for sleep. ?  ? ?  ? ? ?Disposition:  Home  ?Discharge Instructions   ? ? Call MD for:  difficulty breathing, headache or visual disturbances   Complete by: As directed ?  ? Call MD for:  extreme fatigue   Complete by: As directed ?  ? Call MD for:  hives   Complete by: As directed ?  ? Call MD for:  persistant dizziness or light-headedness   Complete by: As directed ?  ? Call MD for:  persistant nausea and vomiting   Complete by: As  directed ?  ? Call MD for:  redness, tenderness, or signs of infection (pain, swelling, redness, odor or green/yellow discharge around incision site)   Complete by: As directed ?  ? Call MD for:  severe uncontrolled pain   Complete by: As directed ?  ? Call MD for:  temperature >100.4   Complete by: As directed ?  ? Diet - low sodium heart healthy   Complete by: As directed ?  ? Discharge instructions   Complete by: As directed ?  ? WATCHMAN? Procedure, Care After ? ?Procedure MD: Dr. Quentin Ore ?Watchman Clinical Coordinator: Lenice Llamas, RN ? ?This sheet gives you information about how to care for yourself after your procedure. Your health care provider may also give you more specific instructions. If you have problems or questions, contact your health  care provider. ? ?What can I expect after the procedure? ?After the procedure, it is common to have: ?Bruising around your puncture site. ?Tenderness around your puncture site. ?Tiredness (fatigue). ? ?Medication instructions ?It is very important to continue to take your blood thinner as directed by your doctor after the Watchman procedure. Call your procedure doctor's office with question or concerns. ?If you are on Coumadin (warfarin), you will have your INR checked the week after your procedure, with a goal INR of 2.0 - 3.0. ?Please follow your medication instructions on your discharge summary. Only take the medications listed on your discharge paperwork. ?You will need 6 months of SBE antibiotics with dental procedures/cleanings. This will be discussed and prescribed at your follow up  ? ?Follow up ?You will have another TEE (Transesophageal Echocardiogram) approximately 6 weeks after your procedure mark to check your device ?You will follow up the MD/APP who performed your procedure 6 months after your procedure ?The Watchman Clinical Coordinator will check in with you from time to time, including 1 and 2 years after your procedure.  ? ? ?Follow these instructions  at home: ?Puncture site care  ?Follow instructions from your health care provider about how to take care of your puncture site. Make sure you: ?If present, leave stitches (sutures), skin glue, or adhesive s

## 2021-08-08 NOTE — Interval H&P Note (Signed)
History and Physical Interval Note: ? ?08/08/2021 ?7:02 AM ? ?Ronald Hull  has presented today for surgery, with the diagnosis of afib.  The various methods of treatment have been discussed with the patient and family. After consideration of risks, benefits and other options for treatment, the patient has consented to  Procedure(s): ?LEFT ATRIAL APPENDAGE OCCLUSION (N/A) ?TRANSESOPHAGEAL ECHOCARDIOGRAM (TEE) (N/A) as a surgical intervention.  The patient's history has been reviewed, patient examined, no change in status, stable for surgery.  I have reviewed the patient's chart and labs.  Questions were answered to the patient's satisfaction.   ? ? ?[name] Anneth Brunell ? ? ?

## 2021-08-08 NOTE — Plan of Care (Signed)

## 2021-08-09 ENCOUNTER — Encounter (HOSPITAL_COMMUNITY): Payer: Self-pay | Admitting: Cardiology

## 2021-08-09 DIAGNOSIS — I2510 Atherosclerotic heart disease of native coronary artery without angina pectoris: Secondary | ICD-10-CM | POA: Diagnosis not present

## 2021-08-09 DIAGNOSIS — I440 Atrioventricular block, first degree: Secondary | ICD-10-CM | POA: Diagnosis not present

## 2021-08-09 DIAGNOSIS — I4819 Other persistent atrial fibrillation: Secondary | ICD-10-CM | POA: Diagnosis not present

## 2021-08-09 DIAGNOSIS — Z006 Encounter for examination for normal comparison and control in clinical research program: Secondary | ICD-10-CM | POA: Diagnosis not present

## 2021-08-09 LAB — BASIC METABOLIC PANEL
Anion gap: 6 (ref 5–15)
BUN: 17 mg/dL (ref 8–23)
CO2: 21 mmol/L — ABNORMAL LOW (ref 22–32)
Calcium: 8.1 mg/dL — ABNORMAL LOW (ref 8.9–10.3)
Chloride: 107 mmol/L (ref 98–111)
Creatinine, Ser: 1.13 mg/dL (ref 0.61–1.24)
GFR, Estimated: >60 mL/min (ref >60)
Glucose, Bld: 145 mg/dL — ABNORMAL HIGH (ref 70–99)
Potassium: 5.3 mmol/L — ABNORMAL HIGH (ref 3.5–5.1)
Sodium: 134 mmol/L — ABNORMAL LOW (ref 135–145)

## 2021-08-09 NOTE — Plan of Care (Signed)

## 2021-08-09 NOTE — Discharge Instructions (Signed)

## 2021-08-13 ENCOUNTER — Telehealth: Payer: Self-pay

## 2021-08-13 DIAGNOSIS — Z95818 Presence of other cardiac implants and grafts: Secondary | ICD-10-CM

## 2021-08-13 DIAGNOSIS — I4819 Other persistent atrial fibrillation: Secondary | ICD-10-CM

## 2021-08-13 NOTE — Telephone Encounter (Signed)
Discussed with Dr. Quentin Ore. The patient is due for CT to assess pulmonary nodules.  ?Will cancel 45-day TEE and arrange cCT for Watchman and non-contrast chest CT together to assess nodules.  ? ?Left message to call back to discuss. ?

## 2021-08-13 NOTE — Addendum Note (Signed)
Addended by: Gunnar Fusi A on: 08/13/2021 04:25 PM ? ? Modules accepted: Orders ? ?

## 2021-08-13 NOTE — Telephone Encounter (Signed)
?  [facility]   ?Watchman Team ? ?Contacted the patient regarding discharge from [facility] on 08/09/2021  ? ?The patient understands discharge instructions? Yes ? ?The patient understands medications and regimen? Yes  ? ?The patient reports groin sites look fine with no S/S of infection or bleeding. ? ?Discussed CT with patient. He requests Monday and Friday appointments. Will arrange CT (chest without contrast) and cCT and follow-up visit and send appointments/instructions via MyChart per patient request. ?He was grateful for call and agrees with plan.  ?

## 2021-08-21 NOTE — Progress Notes (Signed)
?[facility]   ?                                  ?Cardiology Office Note:   ? ?Date:  08/26/2021  ? ?ID:  Ronald Hull, DOB Mar 23, 1946, MRN [id number] ? ?PCP:  Ronald Carol, MD  ?[facility] Cardiologist:  Ronald Mocha, MD  ?[place] Electrophysiologist:  Ronald Meredith Leeds, MD  ? ?Referring MD: Ronald Carol, MD  ? ?Chief Complaint  ?Patient presents with  ? Follow-up  ?  S/p Watchman; pre CT  ? ?History of Present Illness:   ? ?Ronald Hull is a 76 y.o. male with a hx of persistent atrial fibrillation, HTN, HLD, CAD, hx of lung nodules, and sleep apnea on CPAP. [name] presented to his primary physician in 2017 with irregular and elevated heart rates found to have AF. He underwent DCCV to NSR and was placed on amiodarone but was transitioned to Multaq. He unfortunately went back into atrial fibrillation with subsequent repeat cardioversion in 2019. He then had a pre ablation CT which showed significant coronary artery disease and he is now s/p DES to the RCA. He underwent atrial fibrillation ablation 04/2018 with repeat ablation 09/12/2019. He unfortunately has continued to have episodes of atrial fibrillation despite amiodarone. He was seen by Dr. Curt Hull 07/04/21 and wished to be off his Eliquis due to increased bruising and frequent falls. He was then referred to Dr. Quentin Hull for the evaluation of possible Watchman implant.  ? ?He was admitted and underwent left atrial appendage occlusive device placement with Watchman FLX 42m LAAO closure device without complication on 41/32/44. Plan is for follow up CT to ensure adequate device seal. Medication plan: is to continue Eliquis 525mPO BID and if CT criteria are met, plan to discontinue Eliquis and start Plavix 7554mO daily to complete 6 months of post implant therapy. He Ronald need 6 months SBE prophylaxis.  ? ?He comes today and reports that he has been in AF since early this morning. He is disappointed about this as he has had  multiple cardioversions and ablations over the years. He is relatively asymptomatic at this time as his HR has been ranging in the 80-90s. He typically does not have symptoms until his HR is in the 110's. He denies chest Hull, LE edema, orthopnea, dizziness, or SOB. He would like me to speak with Dr. CamCurt Bearsout his options moving forward.  ? ?Past Medical History:  ?Diagnosis Date  ? Arthritis   ? Bronchitis 05/23/2018  ? CAD (coronary artery disease) 02/24/2018  ? LHC 10/19: pLAD 50, mLAD 50 (dFFR=0.89), oLCx 95, pRCA 30, mRCA 80 >> PCI: DES to LCx; DES to RCA // XieDel Airerticipant (Ronald DC Plavix after 30 d and remain on ASA + Apixaban)  ? Chest Hull secondary to ablation, with expected mild elevation in troponin 05/22/2018  ? Complication of anesthesia   ? BP dropped post colonoscopy  ? Hypercholesteremia   ? Hypertension   ? Lung nodules 02/25/2019  ? Chest CT 01/2019: LUL nodule unchanged (benign); new LUL nodules 6 mm, aortic atherosclerosis, emphysema, partially calcified pleural plaques suggesting prior empyema or hemothorax  ? Persistent atrial fibrillation (HCCSidney ? Presence of Watchman left atrial appendage closure device 08/08/2021  ? Watchman 69m1mth Dr. LambQuentin OreSleep apnea   ? does use a cpap  ? Wears glasses   ? ? ?  Past Surgical History:  ?Procedure Laterality Date  ? ATRIAL FIBRILLATION ABLATION N/A 05/20/2018  ? Procedure: ATRIAL FIBRILLATION ABLATION;  Surgeon: Ronald Haw, MD;  Location: [place] [facility];  Service: Cardiovascular;  Laterality: N/A;  ? ATRIAL FIBRILLATION ABLATION N/A 09/02/2019  ? Procedure: ATRIAL FIBRILLATION ABLATION;  Surgeon: Ronald Haw, MD;  Location: [place] [facility];  Service: Cardiovascular;  Laterality: N/A;  ? CARDIOVERSION N/A 11/01/2015  ? Procedure: CARDIOVERSION;  Surgeon: Ronald Casino, MD;  Location: [place];  Service: Cardiovascular;  Laterality: N/A;  ? CARDIOVERSION N/A 11/23/2015  ? Procedure: CARDIOVERSION;  Surgeon:  Ronald Meredith Leeds, MD;  Location: [place];  Service: Cardiovascular;  Laterality: N/A;  ? CARDIOVERSION N/A 10/12/2017  ? Procedure: CARDIOVERSION;  Surgeon: Ronald Casino, MD;  Location: [place];  Service: Cardiovascular;  Laterality: N/A;  ? CARDIOVERSION N/A 06/30/2018  ? Procedure: CARDIOVERSION;  Surgeon: Ronald Pain, MD;  Location: [facility] ENDOSCOPY;  Service: Cardiovascular;  Laterality: N/A;  ? CARDIOVERSION N/A 09/22/2018  ? Procedure: CARDIOVERSION;  Surgeon: Ronald Margarita, MD;  Location: [facility] ENDOSCOPY;  Service: Cardiovascular;  Laterality: N/A;  ? CARDIOVERSION N/A 07/15/2019  ? Procedure: CARDIOVERSION;  Surgeon: Ronald Dresser, MD;  Location: [place];  Service: Cardiovascular;  Laterality: N/A;  ? CARDIOVERSION N/A 12/07/2019  ? Procedure: CARDIOVERSION;  Surgeon: Ronald Latch, MD;  Location: [place];  Service: Cardiovascular;  Laterality: N/A;  ? CARDIOVERSION N/A 03/19/2020  ? Procedure: CARDIOVERSION;  Surgeon: Ronald Margarita, MD;  Location: [facility] ENDOSCOPY;  Service: Cardiovascular;  Laterality: N/A;  ? COLONOSCOPY    ? CORONARY STENT INTERVENTION N/A 02/24/2018  ? Procedure: CORONARY STENT INTERVENTION;  Surgeon: Ronald Mocha, MD;  Location: [place] [facility];  Service: Cardiovascular;  Laterality: N/A;  ? I & D EXTREMITY Left 10/19/2019  ? Procedure: IRRIGATION AND DEBRIDEMENT LEFT SMALL PROXIMAL PHALANX;  Surgeon: Iran Planas, MD;  Location: [place];  Service: Orthopedics;  Laterality: Left;  ? INTRAVASCULAR PRESSURE WIRE/FFR STUDY N/A 02/24/2018  ? Procedure: INTRAVASCULAR PRESSURE WIRE/FFR STUDY;  Surgeon: Ronald Mocha, MD;  Location: [place] [facility];  Service: Cardiovascular;  Laterality: N/A;  ? KNEE ARTHROSCOPY  2000  ? LEFT ATRIAL APPENDAGE OCCLUSION N/A 08/08/2021  ? Procedure: LEFT ATRIAL APPENDAGE OCCLUSION;  Surgeon: Ronald Epley, MD;  Location: [place] [facility];  Service: Cardiovascular;  Laterality: N/A;  ? LEFT HEART  CATH AND CORONARY ANGIOGRAPHY N/A 02/24/2018  ? Procedure: LEFT HEART CATH AND CORONARY ANGIOGRAPHY;  Surgeon: Ronald Mocha, MD;  Location: [place] [facility];  Service: Cardiovascular;  Laterality: N/A;  ? OPEN REDUCTION INTERNAL FIXATION (ORIF) PROXIMAL PHALANX Left 10/19/2019  ? Procedure: OPEN REDUCTION INTERNAL FIXATION (ORIF) PROXIMAL PHALANX WITH K-WIRES LEFT SMALL;  Surgeon: Iran Planas, MD;  Location: [place];  Service: Orthopedics;  Laterality: Left;  ? SHOULDER ARTHROSCOPY WITH ROTATOR CUFF REPAIR AND SUBACROMIAL DECOMPRESSION Right 07/15/2012  ? Procedure: RIGHT ARTHROSCOPY SHOULDER DECOMPRESSION  SUBACROMIAL PARTIAL ACROMIOPLASTY WITH CORACOACROMIAL RELEASE,  DISTAL CLAVICULECTOMY, resect biceps debride labrium WITH ROTATOR CUFF REPAIR;  Surgeon: Ninetta Lights, MD;  Location: [place];  Service: Orthopedics;  Laterality: Right;  ? TEE WITHOUT CARDIOVERSION N/A 08/08/2021  ? Procedure: TRANSESOPHAGEAL ECHOCARDIOGRAM (TEE);  Surgeon: Ronald Epley, MD;  Location: [place] [facility];  Service: Cardiovascular;  Laterality: N/A;  ? ? ?Current Medications: ?Current Meds  ?Medication Sig  ? acetaminophen (TYLENOL) 325 MG tablet Take 325 mg by mouth every 6 (six)  hours as needed (for Hull.).   ? amiodarone (PACERONE) 200 MG tablet Take 1 tablet (200 mg total) by mouth daily.  ? atorvastatin (LIPITOR) 40 MG tablet Take 1 tablet (40 mg total) by mouth daily. Please make overdue appt with Dr. Burt Knack before anymore refills. Thank you 3rd and Final Attempt  ? ELIQUIS 5 MG TABS tablet TAKE 1 TABLET(5 MG) BY MOUTH TWICE DAILY  ? furosemide (LASIX) 20 MG tablet Take 1-2 tablets (20-40 mg total) by mouth daily as needed for fluid.  ? losartan (COZAAR) 100 MG tablet Take 1 tablet (100 mg total) by mouth daily.  ? metoprolol succinate (TOPROL-XL) 50 MG 24 hr tablet Take 1 tablet (50 mg total) by mouth daily. Take with or immediately following a meal.  ? nitroGLYCERIN (NITROSTAT)  0.4 MG SL tablet PLACE 1 TABLET UNDER THE TONGUE AS NEEDED FOR CHEST Hull EVERY 5 MINUTES AS NEEDED  ? zaleplon (SONATA) 5 MG capsule Take 5 mg by mouth at bedtime as needed for sleep.  ? [DISCONTINUED]

## 2021-08-22 ENCOUNTER — Other Ambulatory Visit: Payer: Self-pay | Admitting: Cardiology

## 2021-08-22 DIAGNOSIS — I480 Paroxysmal atrial fibrillation: Secondary | ICD-10-CM

## 2021-08-23 NOTE — Telephone Encounter (Signed)
Prescription refill request for Eliquis received. ?Indication: Afib  ?Last office visit: 07/09/21 Quentin Ore) ?Scr: 1.13 (08/09/21) ?Age: 76 ?Weight: 126kg ? ?Appropriate dose and refill sent to requested pharmacy.  ?

## 2021-08-26 ENCOUNTER — Ambulatory Visit: Payer: Medicare Other | Admitting: Cardiology

## 2021-08-26 VITALS — BP 130/80 | HR 85 | Ht 71.0 in | Wt 282.0 lb

## 2021-08-26 DIAGNOSIS — I10 Essential (primary) hypertension: Secondary | ICD-10-CM | POA: Diagnosis not present

## 2021-08-26 DIAGNOSIS — Z95818 Presence of other cardiac implants and grafts: Secondary | ICD-10-CM | POA: Diagnosis not present

## 2021-08-26 DIAGNOSIS — D6869 Other thrombophilia: Secondary | ICD-10-CM | POA: Diagnosis not present

## 2021-08-26 DIAGNOSIS — I4819 Other persistent atrial fibrillation: Secondary | ICD-10-CM | POA: Diagnosis not present

## 2021-08-26 LAB — BASIC METABOLIC PANEL
BUN/Creatinine Ratio: 13 (ref 10–24)
BUN: 14 mg/dL (ref 8–27)
CO2: 23 mmol/L (ref 20–29)
Calcium: 8.8 mg/dL (ref 8.6–10.2)
Chloride: 106 mmol/L (ref 96–106)
Creatinine, Ser: 1.07 mg/dL (ref 0.76–1.27)
Glucose: 116 mg/dL — ABNORMAL HIGH (ref 70–99)
Potassium: 4.8 mmol/L (ref 3.5–5.2)
Sodium: 139 mmol/L (ref 134–144)
eGFR: 72 mL/min/{1.73_m2} (ref >59)

## 2021-08-26 MED ORDER — METOPROLOL SUCCINATE ER 50 MG PO TB24: 50.0000 mg | ORAL_TABLET | Freq: Every day | ORAL | 3 refills | Status: DC

## 2021-08-26 MED ORDER — AMIODARONE HCL 200 MG PO TABS: 200.0000 mg | ORAL_TABLET | Freq: Every day | ORAL | 3 refills | Status: DC

## 2021-08-26 NOTE — Patient Instructions (Signed)
Medication Instructions:  ?Your physician has recommended you make the following change in your medication:  ?INCREASE: amiodarone to 200 mg by mouth once daily ?INCREASE: metoprolol (Toprol XL ) to 50 mg by mouth once daily ?*If you need a refill on your cardiac medications before your next appointment, please call your pharmacy* ? ? ?Lab Work: ?TODAY: BMET ?If you have labs (blood work) drawn today and your tests are completely normal, you will receive your results only by: ?MyChart Message (if you have MyChart) OR ?A paper copy in the mail ?If you have any lab test that is abnormal or we need to change your treatment, we will call you to review the results. ? ? ?Testing/Procedures: ?NONE ? ? ?Follow-Up: To be determined  ?At [facility], you and your health needs are our priority.  As part of our continuing mission to provide you with exceptional heart care, we have created designated Provider Care Teams.  These Care Teams include your primary Cardiologist (physician) and Advanced Practice Providers (APPs -  Physician Assistants and Nurse Practitioners) who all work together to provide you with the care you need, when you need it. ? ?We recommend signing up for the patient portal called "MyChart".  Sign up information is provided on this After Visit Summary.  MyChart is used to connect with patients for Virtual Visits (Telemedicine).  Patients are able to view lab/test results, encounter notes, upcoming appointments, etc.  Non-urgent messages can be sent to your provider as well.   ?To learn more about what you can do with MyChart, go to [email or web address].   ? ? ?Important Information About Sugar ? ? ? ? ?  ?

## 2021-08-27 ENCOUNTER — Other Ambulatory Visit: Payer: Medicare Other

## 2021-08-28 ENCOUNTER — Telehealth: Payer: Self-pay | Admitting: Cardiology

## 2021-08-28 NOTE — Telephone Encounter (Signed)
Called to speak with the patient about scheduling OP DCCV (per Dr. Elberta Fortis request) however he reports that about an hour after our appointment, he returned to NSR with stable HRs in the 50-60 range. He will continue on Amiodarone 200mg  QD and Toprol 50mg  QD until seen by Dr. 7/25. We will continue with post Watchman CT to ensure adequate device seal.  ? ?Elberta Fortis NP-C ?Structural Heart Team  ?Pager: [phone] ?Phone: [phone] ? ?

## 2021-09-05 ENCOUNTER — Ambulatory Visit: Payer: Medicare Other | Admitting: Cardiology

## 2021-09-18 ENCOUNTER — Ambulatory Visit: Payer: Medicare Other

## 2021-09-19 ENCOUNTER — Telehealth (HOSPITAL_COMMUNITY): Payer: Self-pay | Admitting: *Deleted

## 2021-09-19 NOTE — Telephone Encounter (Signed)
Reaching out to patient to offer assistance regarding upcoming cardiac imaging study; pt verbalizes understanding of appt date/time, parking situation and where to check in; name and call back number provided for further questions should they arise  Ander Wamser RN Navigator Cardiac Imaging [facility] [phone] office [phone] cell  

## 2021-09-20 ENCOUNTER — Ambulatory Visit (HOSPITAL_COMMUNITY)
Admission: RE | Admit: 2021-09-20 | Discharge: 2021-09-20 | Disposition: A | Discharge status: Home / Self Care | Payer: Medicare Other | Source: Ambulatory Visit | Attending: Cardiology | Admitting: Cardiology

## 2021-09-20 DIAGNOSIS — R918 Other nonspecific abnormal finding of lung field: Secondary | ICD-10-CM | POA: Insufficient documentation

## 2021-09-20 DIAGNOSIS — I700 Atherosclerosis of aorta: Secondary | ICD-10-CM | POA: Diagnosis not present

## 2021-09-20 DIAGNOSIS — I4819 Other persistent atrial fibrillation: Secondary | ICD-10-CM | POA: Diagnosis not present

## 2021-09-20 DIAGNOSIS — Z95818 Presence of other cardiac implants and grafts: Secondary | ICD-10-CM

## 2021-09-20 MED ORDER — IOHEXOL 350 MG/ML SOLN
100.0000 mL | Freq: Once | INTRAVENOUS | Status: AC | PRN
  Administered 2021-09-20: 100 mL via INTRAVENOUS

## 2021-09-24 ENCOUNTER — Telehealth: Payer: Self-pay

## 2021-09-24 ENCOUNTER — Telehealth: Payer: Self-pay | Admitting: Cardiology

## 2021-09-24 ENCOUNTER — Other Ambulatory Visit: Payer: Self-pay | Admitting: Cardiology

## 2021-09-24 MED ORDER — CLOPIDOGREL BISULFATE 75 MG PO TABS: 75.0000 mg | ORAL_TABLET | Freq: Every day | ORAL | 1 refills | Status: DC

## 2021-09-24 NOTE — Telephone Encounter (Signed)
The patient called back. Informed him he may STOP ELIQUIS and START PLAVIX 75 mg daily. He reported he has a colonoscopy scheduled later this week and will need to hold Plavix.  Georgie Chard, NP, spoke with the patient and instructed him to call Dr. Marlane Hatcher office to inform him he had LAAO and to call back with an update.

## 2021-09-24 NOTE — Telephone Encounter (Addendum)
Lanier Prude, MD  Henrietta Dine, RN; Christell Constant, MD; Filbert Schilder, NP Thanks [facility].   Katy/Jill, OK to stop anticoagulant and transition to Plavix 75mg  PO daily.   Thanks!        Left message to call back.

## 2021-09-24 NOTE — Telephone Encounter (Signed)
Spoke with the patient regarding transition from Eliquis to Plavix 75mg  PO QD. He is scheduled for endoscopy this week with previous clearance sent to cardiology 06/2021 prior to [facility] implant. Post Watchman CT reviewed by [facility] with recommendations for medication transition. Message sent to Dr. Lalla Brothers for clearance to proceed with endoscopy procedure while taking Plavix. Waiting for response.     Ewing Schlein NP-C Structural Heart Team  Pager: [phone] Phone: [phone]

## 2021-09-25 ENCOUNTER — Encounter (HOSPITAL_COMMUNITY): Payer: Self-pay

## 2021-09-25 ENCOUNTER — Telehealth: Payer: Self-pay | Admitting: Cardiology

## 2021-09-25 ENCOUNTER — Ambulatory Visit (HOSPITAL_COMMUNITY): Admit: 2021-09-25 | Payer: Medicare Other | Admitting: Cardiovascular Disease

## 2021-09-25 SURGERY — ECHOCARDIOGRAM, TRANSESOPHAGEAL
Anesthesia: Monitor Anesthesia Care

## 2021-09-25 NOTE — Telephone Encounter (Signed)
  [facility]   MULTIDISCIPLINARY HEART TEAM  Spoke with patient today. He has picked up his Plavix however decided to not start this until after his endoscopy tomorrow afternoon. He states that he is scheduled for 12pm and plans to start Plavix promptly after his procedure once able to have fluids. Plans to see Dr. Curt Bears 7/25.     Kathyrn Drown NP-C Structural Heart Team  Pager: [phone] Phone: [phone]

## 2021-09-26 DIAGNOSIS — K5730 Diverticulosis of large intestine without perforation or abscess without bleeding: Secondary | ICD-10-CM | POA: Diagnosis not present

## 2021-09-26 DIAGNOSIS — K649 Unspecified hemorrhoids: Secondary | ICD-10-CM | POA: Diagnosis not present

## 2021-09-26 DIAGNOSIS — D123 Benign neoplasm of transverse colon: Secondary | ICD-10-CM | POA: Diagnosis not present

## 2021-09-26 DIAGNOSIS — Z86010 Personal history of colonic polyps: Secondary | ICD-10-CM | POA: Diagnosis not present

## 2021-10-02 DIAGNOSIS — D123 Benign neoplasm of transverse colon: Secondary | ICD-10-CM | POA: Diagnosis not present

## 2021-10-07 ENCOUNTER — Other Ambulatory Visit: Payer: Self-pay | Admitting: Cardiology

## 2021-10-25 ENCOUNTER — Ambulatory Visit: Payer: Medicare Other | Attending: Internal Medicine

## 2021-10-25 ENCOUNTER — Other Ambulatory Visit (HOSPITAL_BASED_OUTPATIENT_CLINIC_OR_DEPARTMENT_OTHER): Payer: Self-pay

## 2021-10-25 DIAGNOSIS — Z23 Encounter for immunization: Secondary | ICD-10-CM

## 2021-10-25 MED ORDER — PFIZER COVID-19 VAC BIVALENT 30 MCG/0.3ML IM SUSP
INTRAMUSCULAR | 0 refills | Status: DC
  Filled 2021-10-25: qty 0.3, 1d supply, fill #0

## 2021-10-25 NOTE — Progress Notes (Signed)
   Covid-19 Vaccination Clinic  Name:  Ronald Hull    MRN: [id number] DOB: 13-Dec-1945  10/25/2021  [name] was observed post Covid-19 immunization for 15 minutes without incident. He was provided with Vaccine Information Sheet and instruction to access the V-Safe system.   [name] was instructed to call 911 with any severe reactions post vaccine: Difficulty breathing  Swelling of face and throat  A fast heartbeat  A bad rash all over body  Dizziness and weakness   Immunizations Administered     Name Date Dose VIS Date Route   Pfizer Covid-19 Vaccine Bivalent Booster 10/25/2021  2:22 PM 0.3 mL 12/26/2020 Intramuscular   Manufacturer: [employer], [employer]   Lot: [id number]   NDC: [phone]

## 2021-11-19 ENCOUNTER — Ambulatory Visit: Payer: Medicare Other | Admitting: Cardiology

## 2021-11-21 NOTE — Progress Notes (Signed)
PCP:  Renford Dills, MD Primary Cardiologist: Tonny Bollman, MD Electrophysiologist: Regan Lemming, MD   Ronald Hull is a 76 y.o. male seen today for Will [name], MD for routine electrophysiology followup.  Since last being seen in our clinic the patient reports doing well overall. He has not felt any more AF since May. Remains on amiodarone. Remains on plavix s/p Watchman 07/2021. He exercises for about 30 minutes daily, usually with walking/jogging at a pace of about 3 mph. he denies chest pain, palpitations, dyspnea, PND, orthopnea, nausea, vomiting, dizziness, syncope, weight gain, or early satiety. Uses prn lasix approx twice a month.   Past Medical History:  Diagnosis Date   Arthritis    Bronchitis 05/23/2018   CAD (coronary artery disease) 02/24/2018   LHC 10/19: pLAD 50, mLAD 50 (dFFR=0.89), oLCx 95, pRCA 30, mRCA 80 >> PCI: DES to LCx; DES to RCA // [id number] Trial participant (will DC Plavix after 30 d and remain on ASA + Apixaban)   Chest pain secondary to ablation, with expected mild elevation in troponin 05/22/2018   Complication of anesthesia    BP dropped post colonoscopy   Hypercholesteremia    Hypertension    Lung nodules 02/25/2019   Chest CT 01/2019: LUL nodule unchanged (benign); new LUL nodules 6 mm, aortic atherosclerosis, emphysema, partially calcified pleural plaques suggesting prior empyema or hemothorax   Persistent atrial fibrillation (HCC)    Presence of Watchman left atrial appendage closure device 08/08/2021   Watchman 54mm with Dr. Lalla Brothers   Sleep apnea    does use a cpap   Wears glasses    Past Surgical History:  Procedure Laterality Date   ATRIAL FIBRILLATION ABLATION N/A 05/20/2018   Procedure: ATRIAL FIBRILLATION ABLATION;  Surgeon: Regan Lemming, MD;  Location: [facility] INVAS[facility];  Service: Cardiovascular;  Laterality: N/A;   ATRIAL FIBRILLATION ABLATION N/A 09/02/2019   Procedure: ATRIAL FIBRILLATION ABLATION;  Surgeon:  Regan Lemming, MD;  Location: [facility] INVASIVE CV [facility];  Service: Cardiovascular;  Laterality: N/A;   CARDIOVERSION N/A 11/01/2015   Procedure: CARDIOVERSION;  Surgeon: [name], MD;  Location[facility] ENDOSCOPY;  Service: Cardiovascular;  Laterality: N/A;   CARDIOVERSION N/A 11/23/2015   Procedure: CARDIOVERSION;  Surgeon: Will [name], MD;  Location: [facility] ENDOSCOPY;  Service: Cardiovascular;  Laterality: N/A;   CARDIOVERSION N/A 10/12/2017   Procedure: CARDIOVERSION;  Surgeon: [name], MD;  Location: [facility] ENDOSCOPY;  Service: Cardiovascular;  Laterality: N/A;   CARDIOVERSION N/A 06/30/2018   Procedure: CARDIOVERSION;  Surgeon: Jake Bathe, MD;  Location: [facility] ENDOSCOPY;  Service: Cardiovascular;  Laterality: N/A;   CARDIOVERSION N/A 09/22/2018   Procedure: CARDIOVERSION;  Surgeon: Quintella Reichert, MD;  Location: [facility] ENDOSCOPY;  Service: Cardiovascular;  Laterality: N/A;   CARDIOVERSION N/A 07/15/2019   Procedure: CARDIOVERSION;  Surgeon: Jodelle Red, MD;  Location: [facility] ENDOSCOPY;  Service: Cardiovascular;  Laterality: N/A;   CARDIOVERSION N/A 12/07/2019   Procedure: CARDIOVERSION;  Surgeon: Chilton Si, MD;  Location: [facility] ENDOSCOPY;  Service: Cardiovascular;  Laterality: N/A;   CARDIOVERSION N/A 03/19/2020   Procedure: CARDIOVERSION;  Surgeon: Quintella Reichert, MD;  Location: [facility] ENDOSCOPY;  Service: Cardiovascular;  Laterality: N/A;   COLONOSCOPY     CORONARY STENT INTERVENTION N/A 02/24/2018   Procedure: CORONARY STENT INTERVENTION;  Surgeon: Tonny Bollman, MD;  Location: [facility] INVASIVE CV LAB;  Service: Cardiovascular;  Laterality: N/A;   I & D EXTREMITY Left 10/19/2019   Procedure: IRRIGATION AND DEBRIDEMENT LEFT SMALL PROXIMAL  PHALANX;  Surgeon: Bradly Bienenstock, MD;  Location: [facility];  Service: Orthopedics;  Laterality: Left;   INTRAVASCULAR PRESSURE WIRE/FFR STUDY N/A 02/24/2018   Procedure: INTRAVASCULAR PRESSURE WIRE/FFR STUDY;  Surgeon: Tonny Bollman, MD;  Location: [facility] INVASIVE CV LAB;  Service: Cardiovascular;  Laterality: N/A;   KNEE ARTHROSCOPY  2000   LEFT ATRIAL APPENDAGE OCCLUSION N/A 08/08/2021   Procedure: LEFT ATRIAL APPENDAGE OCCLUSION;  Surgeon: Lanier Prude, MD;  Location: [facility];  Service: Cardiovascular;  Laterality: N/A;   LEFT HEART CATH AND CORONARY ANGIOGRAPHY N/A 02/24/2018   Procedure: LEFT HEART CATH AND CORONARY ANGIOGRAPHY;  Surgeon: Tonny Bollman, MD;  Location: [facility] INVASIVE CV LAB;  Service: Cardiovascular;  Laterality: N/A;   OPEN REDUCTION INTERNAL FIXATION (ORIF) PROXIMAL PHALANX Left 10/19/2019   Procedure: OPEN REDUCTION INTERNAL FIXATION (ORIF) PROXIMAL PHALANX WITH K-WIRES LEFT SMALL;  Surgeon: Bradly Bienenstock, MD;  Location: [facility];  Service: Orthopedics;  Laterality: Left;   SHOULDER ARTHROSCOPY WITH ROTATOR CUFF REPAIR AND SUBACROMIAL DECOMPRESSION Right 07/15/2012   Procedure: RIGHT ARTHROSCOPY SHOULDER DECOMPRESSION  SUBACROMIAL PARTIAL ACROMIOPLASTY WITH CORACOACROMIAL RELEASE,  DISTAL CLAVICULECTOMY, resect biceps debride labrium WITH ROTATOR CUFF REPAIR;  Surgeon: [name], MD;  Location: [facility];  Service: Orthopedics;  Laterality: Right;   TEE WITHOUT CARDIOVERSION N/A 08/08/2021   Procedure: TRANSESOPHAGEAL ECHOCARDIOGRAM (TEE);  Surgeon: Lanier Prude, MD;  Location: [facility] INVASIVE CV LAB;  Service: Cardiovascular;  Laterality: N/A;    Current Outpatient Medications  Medication Sig Dispense Refill   acetaminophen (TYLENOL) 325 MG tablet Take 325 mg by mouth every 6 (six) hours as needed (for pain.).      amiodarone (PACERONE) 200 MG tablet Take 1 tablet (200 mg total) by mouth daily. (Patient taking differently: Take 100 mg by mouth daily.) 90 tablet 3   atorvastatin (LIPITOR) 40 MG tablet Take 1 tablet (40 mg total) by mouth daily. Please make overdue appt with Dr. Excell Seltzer before anymore refills. Thank you 3rd and Final Attempt 15  tablet 0   clopidogrel (PLAVIX) 75 MG tablet Take 1 tablet (75 mg total) by mouth daily. 90 tablet 1   COVID-19 mRNA bivalent vaccine, Pfizer, (PFIZER COVID-19 VAC BIVALENT) injection Inject into the muscle. 0.3 mL 0   furosemide (LASIX) 20 MG tablet Take 1-2 tablets (20-40 mg total) by mouth daily as needed for fluid.     losartan (COZAAR) 100 MG tablet TAKE 1 TABLET(100 MG) BY MOUTH DAILY 90 tablet 2   metoprolol succinate (TOPROL-XL) 50 MG 24 hr tablet Take 1 tablet (50 mg total) by mouth daily. Take with or immediately following a meal. 90 tablet 3   nitroGLYCERIN (NITROSTAT) 0.4 MG SL tablet PLACE 1 TABLET UNDER THE TONGUE AS NEEDED FOR CHEST PAIN EVERY 5 MINUTES AS NEEDED 25 tablet 9   zaleplon (SONATA) 5 MG capsule Take 5 mg by mouth at bedtime as needed for sleep.     No current facility-administered medications for this visit.    No Known Allergies  Social History   Socioeconomic History   Marital status: Married    Spouse name: Not on file   Number of children: Not on file   Years of education: Not on file   Highest education level: Not on file  Occupational History   Not on file  Tobacco Use   Smoking status: Former    Packs/day: 2.00    Years: 10.00    Total pack years: 20.00  Types: Cigarettes    Quit date: 07/09/1973    Years since quitting: 48.4   Smokeless tobacco: Never  Vaping Use   Vaping Use: Never used  Substance and Sexual Activity   Alcohol use: Yes    Alcohol/week: 2.0 standard drinks of alcohol    Types: 2 Glasses of wine per week   Drug use: No   Sexual activity: Not Currently    Birth control/protection: Abstinence  Other Topics Concern   Not on file  Social History Narrative   Right Handed    Lives in a two story home    Social Determinants of Health   Financial Resource Strain: Low Risk  (05/22/2018)   Overall Financial Resource Strain (CARDIA)    Difficulty of Paying Living Expenses: Not very hard  Food Insecurity: No Food Insecurity  (05/22/2018)   Hunger Vital Sign    Worried About Running Out of Food in the Last Year: Never true    Ran Out of Food in the Last Year: Never true  Transportation Needs: No Transportation Needs (05/22/2018)   PRAPARE - Administrator, Civil Service (Medical): No    Lack of Transportation (Non-Medical): No  Physical Activity: Not on file  Stress: Not on file  Social Connections: Not on file  Intimate Partner Violence: Not At Risk (05/22/2018)   Humiliation, Afraid, Rape, and Kick questionnaire    Fear of Current or Ex-Partner: No    Emotionally Abused: No    Physically Abused: No    Sexually Abused: No     Review of Systems: All other systems reviewed and are otherwise negative except as noted above.  Physical Exam: Vitals:   11/26/21 0842  BP: (!) 156/84  Pulse: (!) 50  SpO2: 94%  Weight: 284 lb (128.8 kg)  Height: 5\' 11"  (1.803 m)    GEN- The patient is well appearing, alert and oriented x 3 today.   HEENT: normocephalic, atraumatic; sclera clear, conjunctiva pink; hearing intact; oropharynx clear; neck supple, no JVP Lymph- no cervical lymphadenopathy Lungs- Clear to ausculation bilaterally, normal work of breathing.  No wheezes, rales, rhonchi Heart- Regular rate and rhythm, no murmurs, rubs or gallops, PMI not laterally displaced GI- soft, non-tender, non-distended, bowel sounds present, no hepatosplenomegaly Extremities- no clubbing, cyanosis, or edema; DP/PT/radial pulses 2+ bilaterally MS- no significant deformity or atrophy Skin- warm and dry, no rash or lesion Psych- euthymic mood, full affect Neuro- strength and sensation are intact  EKG is ordered. Personal review of EKG from today shows sinus brady at 50 bpm  Additional studies reviewed include: Previous EP office notes.   Assessment and Plan:  Persistent atrial fibrillation S/p Watchman 07/2021 Continue plavix for now s/p watchman, will confirm with team when he can stop this Continue  amiodarone 100 mg daily.   3. CAD Denies s/s ischemia  4. Obesity Body mass index is 39.61 kg/m.  Encouraged continued exercise  5. HTN Recheck 130/70  Follow up with Dr. 08/2021 in 6 months   Elberta Fortis, PA-C  11/26/21 8:58 AM

## 2021-11-26 ENCOUNTER — Telehealth: Payer: Self-pay | Admitting: Cardiology

## 2021-11-26 ENCOUNTER — Encounter: Payer: Self-pay | Admitting: Student

## 2021-11-26 ENCOUNTER — Ambulatory Visit: Payer: Medicare Other | Admitting: Student

## 2021-11-26 VITALS — BP 156/84 | HR 50 | Ht 71.0 in | Wt 284.0 lb

## 2021-11-26 DIAGNOSIS — I4819 Other persistent atrial fibrillation: Secondary | ICD-10-CM

## 2021-11-26 DIAGNOSIS — D6869 Other thrombophilia: Secondary | ICD-10-CM | POA: Diagnosis not present

## 2021-11-26 DIAGNOSIS — I10 Essential (primary) hypertension: Secondary | ICD-10-CM

## 2021-11-26 DIAGNOSIS — Z95818 Presence of other cardiac implants and grafts: Secondary | ICD-10-CM

## 2021-11-26 LAB — BASIC METABOLIC PANEL
BUN/Creatinine Ratio: 22 (ref 10–24)
BUN: 25 mg/dL (ref 8–27)
CO2: 22 mmol/L (ref 20–29)
Calcium: 9.1 mg/dL (ref 8.6–10.2)
Chloride: 108 mmol/L — ABNORMAL HIGH (ref 96–106)
Creatinine, Ser: 1.13 mg/dL (ref 0.76–1.27)
Glucose: 100 mg/dL — ABNORMAL HIGH (ref 70–99)
Potassium: 5 mmol/L (ref 3.5–5.2)
Sodium: 142 mmol/L (ref 134–144)
eGFR: 67 mL/min/{1.73_m2} (ref >59)

## 2021-11-26 LAB — CBC
Hematocrit: 40.9 % (ref 37.5–51.0)
Hemoglobin: 13.9 g/dL (ref 13.0–17.7)
MCH: 31.7 pg (ref 26.6–33.0)
MCHC: 34 g/dL (ref 31.5–35.7)
MCV: 93 fL (ref 79–97)
Platelets: 172 10*3/uL (ref 150–450)
RBC: 4.39 x10E6/uL (ref 4.14–5.80)
RDW: 13.2 % (ref 11.6–15.4)
WBC: 6.5 10*3/uL (ref 3.4–10.8)

## 2021-11-26 NOTE — Patient Instructions (Signed)
Medication Instructions:  Your physician recommends that you continue on your current medications as directed. Please refer to the Current Medication list given to you today.  *If you need a refill on your cardiac medications before your next appointment, please call your pharmacy*   Lab Work: TODAY: BMET, CBC  If you have labs (blood work) drawn today and your tests are completely normal, you will receive your results only by: MyChart Message (if you have MyChart) OR A paper copy in the mail If you have any lab test that is abnormal or we need to change your treatment, we will call you to review the results.   Follow-Up: At [facility], you and your health needs are our priority.  As part of our continuing mission to provide you with exceptional heart care, we have created designated Provider Care Teams.  These Care Teams include your primary Cardiologist (physician) and Advanced Practice Providers (APPs -  Physician Assistants and Nurse Practitioners) who all work together to provide you with the care you need, when you need it.   Your next appointment:   6 month(s)  The format for your next appointment:   In Person  Provider:   Will Camnitz, MD{ 

## 2021-11-26 NOTE — Telephone Encounter (Signed)
Patient seen in EP follow up earlier this AM and requesting to stop Plavix. Spoke to patient to confirm Plavix to be continued until 01/2022 given Watchman implant 07/2021.    Georgie Chard NP-C Structural Heart Team  Pager: [phone] Phone: [phone]

## 2022-01-09 ENCOUNTER — Telehealth: Payer: Self-pay

## 2022-01-09 NOTE — Telephone Encounter (Signed)
Scheduled the patient for 6 month LAAO visit with Georgie Chard on 02/03/22. Ronald Hull was grateful for call and agrees with plan.

## 2022-01-22 DIAGNOSIS — G4733 Obstructive sleep apnea (adult) (pediatric): Secondary | ICD-10-CM | POA: Diagnosis not present

## 2022-01-30 NOTE — Progress Notes (Signed)
[facility]                                     Cardiology Office Note:    Date:  02/03/2022   ID:  Ronald Hull, DOB 1945/10/28, MRN [id number]  PCP:  Seward Carol, MD  [facility] Cardiologist:  Sherren Mocha, MD  [facility] Electrophysiologist:  Will Meredith Leeds, MD   Referring MD: Seward Carol, MD   Chief Complaint  Patient presents with   Follow-up    6 month s/p LAAO    History of Present Illness:    Ronald Hull is a 76 y.o. male with a hx of persistent atrial fibrillation, HTN, HLD, CAD, hx of lung nodules, and sleep apnea on CPAP. Ronald Hull presented to his primary physician in 2017 with irregular and elevated heart rates found to have AF. He underwent DCCV to NSR and was placed on amiodarone but was transitioned to Multaq. He unfortunately went back into atrial fibrillation with subsequent repeat cardioversion in 2019. He then had a pre ablation CT which showed significant coronary artery disease and he is now s/p DES to the RCA. He underwent atrial fibrillation ablation 04/2018 with repeat ablation 09/12/2019. He unfortunately has continued to have episodes of atrial fibrillation despite amiodarone. He was seen by Dr. Curt Bears 07/04/21 and wished to be off his Eliquis due to increased bruising and frequent falls. He was then referred to Dr. Quentin Ore for the evaluation of possible Watchman implant.    He was admitted and underwent left atrial appendage occlusive device placement with Watchman FLX 83m LAAO closure device without complication on 45/88/50 Plan was  for follow up CT to ensure adequate device seal. This showed stable device placement and no leak. He was therefore transitioned off Eliquis and started Plavix with plans to continue through 6 months of therapy. He will need 6 months of dental SBE prophylaxis.   Today he is here alone and reports he has been doing very well since he was last seen. He is tolerating medications. He denies chest  pain, palpitations, LE edema, SOB, dizziness, or syncope. He has no bleeding in stool or urine. Has been following closely with EP.   Past Medical History:  Diagnosis Date   Arthritis    Bronchitis 05/23/2018   CAD (coronary artery disease) 02/24/2018   LHC 10/19: pLAD 50, mLAD 50 (dFFR=0.89), oLCx 95, pRCA 30, mRCA 80 >> PCI: DES to LCx; DES to RCA // [id number] Trial participant (will DC Plavix after 30 d and remain on ASA + Apixaban)   Chest pain secondary to ablation, with expected mild elevation in troponin 027/74/1287  Complication of anesthesia    BP dropped post colonoscopy   Hypercholesteremia    Hypertension    Lung nodules 02/25/2019   Chest CT 01/2019: LUL nodule unchanged (benign); new LUL nodules 6 mm, aortic atherosclerosis, emphysema, partially calcified pleural plaques suggesting prior empyema or hemothorax   Persistent atrial fibrillation ([place]    Presence of Watchman left atrial appendage closure device 08/08/2021   Watchman 266mwith Dr. LaQuentin Ore Sleep apnea    does use a cpap   Wears glasses     Past Surgical History:  Procedure Laterality Date   ATRIAL FIBRILLATION ABLATION N/A 05/20/2018   Procedure: ATRIAL FIBRILLATION ABLATION;  Surgeon: CaConstance HawMD;  [facility];  Service: Cardiovascular;  Laterality: N/A;   ATRIAL FIBRILLATION ABLATION N/A 09/02/2019   Procedure: ATRIAL FIBRILLATION ABLATION;  Surgeon: Constance Haw, MD;  Location: [place] CV LAB;  Service: Cardiovascular;  Laterality: N/A;   CARDIOVERSION N/A 11/01/2015   Procedure: CARDIOVERSION;  Surgeon: Pixie Casino, MD;  Location: [facility] ENDOSCOPY;  Service: Cardiovascular;  Laterality: N/A;   CARDIOVERSION N/A 11/23/2015   Procedure: CARDIOVERSION;  Surgeon: Will Meredith Leeds, MD;  Location: [place];  Service: Cardiovascular;  Laterality: N/A;   CARDIOVERSION N/A 10/12/2017   Procedure: CARDIOVERSION;  Surgeon: Pixie Casino, MD;  Location: [place];   Service: Cardiovascular;  Laterality: N/A;   CARDIOVERSION N/A 06/30/2018   Procedure: CARDIOVERSION;  Surgeon: Jerline Pain, MD;  Location: [place];  Service: Cardiovascular;  Laterality: N/A;   CARDIOVERSION N/A 09/22/2018   Procedure: CARDIOVERSION;  Surgeon: Sueanne Margarita, MD;  Location: [facility] ENDOSCOPY;  Service: Cardiovascular;  Laterality: N/A;   CARDIOVERSION N/A 07/15/2019   Procedure: CARDIOVERSION;  Surgeon: Buford Dresser, MD;  Location: [facility] ENDOSCOPY;  Service: Cardiovascular;  Laterality: N/A;   CARDIOVERSION N/A 12/07/2019   Procedure: CARDIOVERSION;  Surgeon: Skeet Latch, MD;  Location: [place];  Service: Cardiovascular;  Laterality: N/A;   CARDIOVERSION N/A 03/19/2020   Procedure: CARDIOVERSION;  Surgeon: Sueanne Margarita, MD;  Location: [facility] ENDOSCOPY;  Service: Cardiovascular;  Laterality: N/A;   COLONOSCOPY     CORONARY STENT INTERVENTION N/A 02/24/2018   Procedure: CORONARY STENT INTERVENTION;  Surgeon: Sherren Mocha, MD;  Location: [place] CV LAB;  Service: Cardiovascular;  Laterality: N/A;   I & D EXTREMITY Left 10/19/2019   Procedure: IRRIGATION AND DEBRIDEMENT LEFT SMALL PROXIMAL PHALANX;  Surgeon: Iran Planas, MD;  Location: [place];  Service: Orthopedics;  Laterality: Left;   INTRAVASCULAR PRESSURE WIRE/FFR STUDY N/A 02/24/2018   Procedure: INTRAVASCULAR PRESSURE WIRE/FFR STUDY;  Surgeon: Sherren Mocha, MD;  Location: [place] CV LAB;  Service: Cardiovascular;  Laterality: N/A;   KNEE ARTHROSCOPY  2000   LEFT ATRIAL APPENDAGE OCCLUSION N/A 08/08/2021   Procedure: LEFT ATRIAL APPENDAGE OCCLUSION;  Surgeon: Vickie Epley, MD;  Location: [place] CV LAB;  Service: Cardiovascular;  Laterality: N/A;   LEFT HEART CATH AND CORONARY ANGIOGRAPHY N/A 02/24/2018   Procedure: LEFT HEART CATH AND CORONARY ANGIOGRAPHY;  Surgeon: Sherren Mocha, MD;  Location: [place] CV LAB;  Service: Cardiovascular;  Laterality: N/A;    OPEN REDUCTION INTERNAL FIXATION (ORIF) PROXIMAL PHALANX Left 10/19/2019   Procedure: OPEN REDUCTION INTERNAL FIXATION (ORIF) PROXIMAL PHALANX WITH K-WIRES LEFT SMALL;  Surgeon: Iran Planas, MD;  Location: [place];  Service: Orthopedics;  Laterality: Left;   SHOULDER ARTHROSCOPY WITH ROTATOR CUFF REPAIR AND SUBACROMIAL DECOMPRESSION Right 07/15/2012   Procedure: RIGHT ARTHROSCOPY SHOULDER DECOMPRESSION  SUBACROMIAL PARTIAL ACROMIOPLASTY WITH CORACOACROMIAL RELEASE,  DISTAL CLAVICULECTOMY, resect biceps debride labrium WITH ROTATOR CUFF REPAIR;  Surgeon: Ninetta Lights, MD;  Location: [place];  Service: Orthopedics;  Laterality: Right;   TEE WITHOUT CARDIOVERSION N/A 08/08/2021   Procedure: TRANSESOPHAGEAL ECHOCARDIOGRAM (TEE);  Surgeon: Vickie Epley, MD;  Location: [place] CV LAB;  Service: Cardiovascular;  Laterality: N/A;    Current Medications: Current Meds  Medication Sig   acetaminophen (TYLENOL) 325 MG tablet Take 325 mg by mouth every 6 (six) hours as needed (for pain.).    amiodarone (PACERONE) 100 MG tablet Take 100 mg by mouth daily.   clopidogrel (PLAVIX) 75 MG tablet Take 1 tablet (75 mg total) by mouth daily.  COVID-19 mRNA bivalent vaccine, Pfizer, (PFIZER COVID-19 VAC BIVALENT) injection Inject into the muscle.   furosemide (LASIX) 20 MG tablet Take 1-2 tablets (20-40 mg total) by mouth daily as needed for fluid.   losartan (COZAAR) 100 MG tablet TAKE 1 TABLET(100 MG) BY MOUTH DAILY   metoprolol succinate (TOPROL-XL) 50 MG 24 hr tablet Take 50 mg by mouth daily. Take with or immediately following a meal.   nitroGLYCERIN (NITROSTAT) 0.4 MG SL tablet PLACE 1 TABLET UNDER THE TONGUE AS NEEDED FOR CHEST PAIN EVERY 5 MINUTES AS NEEDED   zaleplon (SONATA) 5 MG capsule Take 5 mg by mouth at bedtime as needed for sleep.   [DISCONTINUED] atorvastatin (LIPITOR) 40 MG tablet Take 1 tablet (40 mg total) by mouth daily. Please make overdue appt  with Dr. Burt Knack before anymore refills. Thank you 3rd and Final Attempt     Allergies:   Lisinopril   Social History   Socioeconomic History   Marital status: Married    Spouse name: Not on file   Number of children: Not on file   Years of education: Not on file   Highest education level: Not on file  Occupational History   Not on file  Tobacco Use   Smoking status: Former    Packs/day: 2.00    Years: 10.00    Total pack years: 20.00    Types: Cigarettes    Quit date: 07/09/1973    Years since quitting: 48.6   Smokeless tobacco: Never  Vaping Use   Vaping Use: Never used  Substance and Sexual Activity   Alcohol use: Yes    Alcohol/week: 2.0 standard drinks of alcohol    Types: 2 Glasses of wine per week   Drug use: No   Sexual activity: Not Currently    Birth control/protection: Abstinence  Other Topics Concern   Not on file  Social History Narrative   Right Handed    Lives in a two story home    Social Determinants of Health   Financial Resource Strain: Low Risk  (05/22/2018)   Overall Financial Resource Strain (CARDIA)    Difficulty of Paying Living Expenses: Not very hard  Food Insecurity: No Food Insecurity (05/22/2018)   Hunger Vital Sign    Worried About Running Out of Food in the Last Year: Never true    Ran Out of Food in the Last Year: Never true  Transportation Needs: No Transportation Needs (05/22/2018)   PRAPARE - Hydrologist (Medical): No    Lack of Transportation (Non-Medical): No  Physical Activity: Not on file  Stress: Not on file  Social Connections: Not on file     Family History: The patient's family history includes Cancer in his sister; Clotting disorder in his mother; Heart disease in his brother; Heart failure in his father; Hypertension in his sister; Other in his mother.  ROS:   Please see the history of present illness.    All other systems reviewed and are negative.  EKGs/Labs/Other Studies Reviewed:     The following studies were reviewed today:  CT 2021-09-28:  IMPRESSION: 1.  Interval placement of a 27 mm Watchman FLX device.   2.  There is no peri-device leak.   3.  Average compression is appropriate.   4. There is device enhancement suggestive of incomplete endothelialization and distal flow consistent with fabric leak. Consider repeat CT scan in 3-6 months vs structural team discussion. DICOM images sent to PACs.   5.  Residual left to right shunt seen post transseptal puncture.  LAAO 08/08/21:   Procedures This Admission:    CONCLUSIONS:  1.Successful implantation of a WATCHMAN left atrial appendage occlusive device    2. TEE demonstrating no LAA thrombus 3. No early apparent complications.    Post Implant Anticoagulation Strategy: Continue Eliquis 70m PO BID x 45 days. If 45-day follow up TEE criteria are met, plan to discontinue Eliquis and start Plavix 752mPO daily to complete 6 months of post implant therapy.  EKG:  EKG is not ordered today.    Recent Labs: 07/04/2021: ALT 15; TSH 3.230 11/26/2021: BUN 25; Creatinine, Ser 1.13; Hemoglobin 13.9; Platelets 172; Potassium 5.0; Sodium 142   Recent Lipid Panel    Component Value Date/Time   CHOL 96 (L) 04/09/2018 0959   TRIG 77 04/09/2018 0959   HDL 31 (L) 04/09/2018 0959   CHOLHDL 3.1 04/09/2018 0959   LDLCALC 50 04/09/2018 0959   Physical Exam:    VS:  BP 124/70   Pulse (!) 48   Ht _0  (1.803 m)   Wt 280 lb 3.2 oz (127.1 kg)   SpO2 98%   BMI 39.08 kg/m     Wt Readings from Last 3 Encounters:  02/03/22 280 lb 3.2 oz (127.1 kg)  11/26/21 284 lb (128.8 kg)  08/26/21 282 lb (127.9 kg)    General: Well developed, well nourished, NAD Lungs:Clear to ausculation bilaterally. Breathing is unlabored. Cardiovascular: RRR with S1 S2. No murmurs Extremities: No edema.  Neuro: Alert and oriented. No focal deficits. No facial asymmetry. MAE spontaneously. Psych: Responds to questions appropriately with normal  affect.    ASSESSMENT/PLAN:    Persistent atrial fibrillation: s/p 2753mLX Watchman implant 08/08/21. Post procedure CT with no leak of devoce thrombus noted. He was transitioned off Eliquis to Plavix and has tolerated this well. Patient may stop Plavix 59m38m 02/07/22. No further need for dental SBE. Plan to follow 07/2022.    HTN: Stable with no changes needed at this time.     HLD: Continue current regimen    CAD: No chest pain. Continue current regimen    Medication Adjustments/Labs and Tests Ordered: Current medicines are reviewed at length with the patient today.  Concerns regarding medicines are outlined above.  No orders of the defined types were placed in this encounter.  Meds ordered this encounter  Medications   atorvastatin (LIPITOR) 40 MG tablet    Sig: Take 1 tablet (40 mg total) by mouth daily.    Dispense:  90 tablet    Refill:  3    Patient Instructions  Medication Instructions:  1.Take your last dose of clopidogrel (Plavix) on 02/07/2022 *If you need a refill on your cardiac medications before your next appointment, please call your pharmacy*   Lab Work: None ordered If you have labs (blood work) drawn today and your tests are completely normal, you will receive your results only by: [place] you have MyChart) OR A paper copy in the mail If you have any lab test that is abnormal or we need to change your treatment, we will call you to review the results.   Follow-Up: [facility] and [facility] are our priority.  As part of our continuing mission to provide you with exceptional heart care, we have created designated Provider Care Teams.  These Care Teams include your primary Cardiologist (physician) and Advanced Practice Providers (APPs -  Physician Assistants and Nurse Practitioners) who  all work together to provide you with the care you need, when you need it.  Your next appointment:   We will be in contact with you to  schedule a follow up appointment.  Important Information About Sugar         Signed, Kathyrn Drown, NP  02/03/2022 11:46 AM    [facility]

## 2022-02-03 ENCOUNTER — Other Ambulatory Visit (HOSPITAL_BASED_OUTPATIENT_CLINIC_OR_DEPARTMENT_OTHER): Payer: Self-pay

## 2022-02-03 ENCOUNTER — Ambulatory Visit: Payer: Medicare Other | Attending: Cardiology | Admitting: Cardiology

## 2022-02-03 VITALS — BP 124/70 | HR 48 | Ht 71.0 in | Wt 280.2 lb

## 2022-02-03 DIAGNOSIS — G4733 Obstructive sleep apnea (adult) (pediatric): Secondary | ICD-10-CM | POA: Diagnosis not present

## 2022-02-03 DIAGNOSIS — I2510 Atherosclerotic heart disease of native coronary artery without angina pectoris: Secondary | ICD-10-CM | POA: Diagnosis not present

## 2022-02-03 DIAGNOSIS — E7800 Pure hypercholesterolemia, unspecified: Secondary | ICD-10-CM | POA: Diagnosis not present

## 2022-02-03 DIAGNOSIS — I10 Essential (primary) hypertension: Secondary | ICD-10-CM

## 2022-02-03 DIAGNOSIS — Z95818 Presence of other cardiac implants and grafts: Secondary | ICD-10-CM | POA: Diagnosis not present

## 2022-02-03 DIAGNOSIS — I4819 Other persistent atrial fibrillation: Secondary | ICD-10-CM | POA: Diagnosis not present

## 2022-02-03 MED ORDER — ATORVASTATIN CALCIUM 40 MG PO TABS
40.0000 mg | ORAL_TABLET | Freq: Every day | ORAL | 3 refills | Status: DC
Start: 2022-02-03 — End: 2023-02-03

## 2022-02-03 MED ORDER — AREXVY 120 MCG/0.5ML IM SUSR
INTRAMUSCULAR | 0 refills | Status: DC
  Filled 2022-02-03: qty 0.5, 1d supply, fill #0

## 2022-02-03 NOTE — Patient Instructions (Signed)
Medication Instructions:  1.Take your last dose of clopidogrel (Plavix) on 02/07/2022 *If you need a refill on your cardiac medications before your next appointment, please call your pharmacy*   Lab Work: None ordered If you have labs (blood work) drawn today and your tests are completely normal, you will receive your results only by: [place] (if you have MyChart) OR A paper copy in the mail If you have any lab test that is abnormal or we need to change your treatment, we will call you to review the results.   Follow-Up: At [facility], you and your health needs are our priority.  As part of our continuing mission to provide you with exceptional heart care, we have created designated Provider Care Teams.  These Care Teams include your primary Cardiologist (physician) and Advanced Practice Providers (APPs -  Physician Assistants and Nurse Practitioners) who all work together to provide you with the care you need, when you need it.  Your next appointment:   We will be in contact with you to schedule a follow up appointment.  Important Information About Sugar

## 2022-02-17 ENCOUNTER — Ambulatory Visit: Payer: Medicare Other | Admitting: Psychology

## 2022-02-17 ENCOUNTER — Encounter: Payer: Self-pay | Admitting: Psychology

## 2022-02-17 DIAGNOSIS — G3184 Mild cognitive impairment, so stated: Secondary | ICD-10-CM | POA: Diagnosis not present

## 2022-02-17 DIAGNOSIS — I6781 Acute cerebrovascular insufficiency: Secondary | ICD-10-CM | POA: Diagnosis not present

## 2022-02-17 DIAGNOSIS — F411 Generalized anxiety disorder: Secondary | ICD-10-CM | POA: Diagnosis not present

## 2022-02-17 DIAGNOSIS — R4189 Other symptoms and signs involving cognitive functions and awareness: Secondary | ICD-10-CM

## 2022-02-17 DIAGNOSIS — F09 Unspecified mental disorder due to known physiological condition: Secondary | ICD-10-CM

## 2022-02-17 DIAGNOSIS — G4733 Obstructive sleep apnea (adult) (pediatric): Secondary | ICD-10-CM | POA: Insufficient documentation

## 2022-02-17 NOTE — Progress Notes (Signed)
   Psychometrician Note   Cognitive testing was administered to Ronald Hull by Milana Kidney, B.S. (psychometrist) under the supervision of Dr. Christia Reading, Ph.D., licensed psychologist on 02/17/2022. Ronald Hull did not appear overtly distressed by the testing session per behavioral observation or responses across self-report questionnaires. Rest breaks were offered.    The battery of tests administered was selected by Dr. Christia Reading, Ph.D. with consideration to Ronald Hull current level of functioning, the nature of his symptoms, emotional and behavioral responses during interview, level of literacy, observed level of motivation/effort, and the nature of the referral question. This battery was communicated to the psychometrist. Communication between Dr. Christia Reading, Ph.D. and the psychometrist was ongoing throughout the evaluation and Dr. Christia Reading, Ph.D. was immediately accessible at all times. Dr. Christia Reading, Ph.D. provided supervision to the psychometrist on the date of this service to the extent necessary to assure the quality of all services provided.    Ronald Hull will return within approximately 1-2 weeks for an interactive feedback session with Dr. Melvyn Novas at which time his test performances, clinical impressions, and treatment recommendations will be reviewed in detail. Ronald Hull understands he can contact our office should he require our assistance before this time.  A total of 140 minutes of billable time were spent face-to-face with Ronald Hull by the psychometrist. This includes both test administration and scoring time. Billing for these services is reflected in the clinical report generated by Dr. Christia Reading, Ph.D.  This note reflects time spent with the psychometrician and does not include test scores or any clinical interpretations made by Dr. Melvyn Novas. The full report will follow in a separate note.

## 2022-02-17 NOTE — Progress Notes (Signed)
NEUROPSYCHOLOGICAL EVALUATION [place] [place] Department of Neurology  Date of Evaluation: February 17, 2022  Reason for Referral:   JANDIEL INGA is a 76 y.o. right-handed Caucasian male referred by Narda Amber, D.O., to characterize his current cognitive functioning and assist with diagnostic clarity and treatment planning in the context of subjective cognitive decline.   Assessment and Plan:   Clinical Impression(s): Mr. Blane pattern of performance is suggestive of neuropsychological functioning within normal limits relative to age-matched peers. All assessed cognitive domains were appropriate and no consistent impairment was exhibited based upon these comparisons. This includes processing speed, attention/concentration, executive functioning, safety/judgment, receptive and expressive language, visuospatial abilities, and learning and memory. Despite this, when utilizing norms that take into account Mr. Closs suspected premorbid intellectual abilities and educational attainment, a few weaknesses were exhibited, namely delayed retrieval of information across list and shape learning tasks, as well as across an unstructured task assessing pattern recognition and adaptability. It is important to consider that there is no testing available for comparison purposes. While these weaknesses could suggest very early and very mild cognitive decline, they could also simply reflect normal intraindividual variability and longstanding/unchanged weaknesses. I do not believe that performances arise to where a formal cognitive disorder diagnosis is warranted at the present time.   There is no pattern of performance across the current evaluation which is suggestive of early stages of Alzheimer's disease or another neurodegenerative illness. Outside of the normal aging process, Mr. Webb did report mild psychiatric distress (largely anxiety), elevated alcohol consumption  (A999333 alcoholic beverages per week), and several cardiovascular medical ailments (i.e., persistent atrial fibrillation, coronary artery disease, essential hypertension, hyperlipidemia). Recent neuroimaging also revealed mild atrophy and microvascular ischemic changes. When combined, all of these variables can influence cognitive abilities and create/maintain mild cognitive inefficiencies. Continued medical monitoring will be important moving forward.  Recommendations: A repeat neuropsychological evaluation in 18-24 months (or sooner if functional decline is noted) is recommended to assess the trajectory of future cognitive decline should it occur. This will also aid in future efforts towards improved diagnostic clarity.  The CDC defines "heavy drinking" in men as 15 or more alcoholic beverages in a week. Mr. Somogyi reported consuming A999333 alcoholic beverages per week during interview, suggesting that he is very near to or regularly exceeding his threshold. Chronic heavy drinking will have a negative effect on cognitive performance and health in general. He is encouraged to decrease regular alcohol consumption.   Mr. Braxton is encouraged to attend to lifestyle factors for brain health (e.g., regular physical exercise, good nutrition habits, regular participation in cognitively-stimulating activities, and general stress management techniques), which are likely to have benefits for both emotional adjustment and cognition. In fact, in addition to promoting good general health, regular exercise incorporating aerobic activities (e.g., brisk walking, jogging, cycling, etc.) has been demonstrated to be a very effective treatment for depression and stress, with similar efficacy rates to both antidepressant medication and psychotherapy. Optimal control of vascular risk factors (including safe cardiovascular exercise and adherence to dietary recommendations) is encouraged. Likewise, continued compliance with his  CPAP machine will also be important. Continued participation in activities which provide mental stimulation and social interaction is also recommended.   If interested, there are some activities which have therapeutic value and can be useful in keeping him cognitively stimulated. For suggestions, Mr. Alongi is encouraged to go to the following website: https://www.[facility].org/get-to-know-barrow/centers-programs/neurorehabilitation-center/neuro-rehab-apps-and-games/ which has options, categorized by level of difficulty. It should be noted that  these activities should not be viewed as a substitute for therapy.  When learning new information, he would benefit from information being broken up into small, manageable pieces. He may also find it helpful to articulate the material in his own words and in a context to promote encoding at the onset of a new task. This material may need to be repeated multiple times to promote encoding.  Memory can be improved using internal strategies such as rehearsal, repetition, chunking, mnemonics, association, and imagery. External strategies such as written notes in a consistently used memory journal, visual and nonverbal auditory cues such as a calendar on the refrigerator or appointments with alarm, such as on a cell phone, can also help maximize recall.    Reducing anxiety may also aid in the retrieval of information. Mr. Boilard is encouraged to prepare scripts he can use socially when he experiences difficulty with word finding or memory. Such scripts should be brief explanations of the difficulty (e.g., "the word escapes me now") and allow him to move the conversation forward quickly rather than dwelling on the issue.  Review of Records:   Mr. Gehm was seen by [facility] Narda Amber, D.O.) on 06/12/2021 for follow-up of ongoing neuropathy. Briefly, starting around 2021, he began having a sensation that his feet are wrapped in gauze or that he is walking  on sand. Symptoms became more bothersome since the summer of 2022. He denied burning or tingling. Symptoms involve the soles and balls of the feet and are more noticeable when he is resting or in bed. Balance has been diminishing and he had several falls over the past year or so. NCS/EMG of the legs performed by Dr. Domingo Cocking revealed chronic sensorimotor axonal polyneuropathy. At the time of his follow-up, symptoms were said to be stable. During that appointment, he expressed concerns surrounding increased forgetfulness, generally with names. 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 ADLs were described as intact. Performance on a brief cognitive screening instrument (MOCA) was 30/30. Ultimately, Mr. Mulcahy was referred for a comprehensive neuropsychological evaluation to characterize his cognitive abilities and to assist with diagnostic clarity and treatment planning.   Brain MRI on 07/09/2021 revealed mild atrophy with mild ex vacuo ventricular dilation, as well as mild nonspecific microvascular ischemic changes.   Past Medical History:  Diagnosis Date   Arthritis    Bronchitis 05/23/2018   CAD (coronary artery disease) 02/24/2018   LHC 10/19: pLAD 50, mLAD 50 (dFFR=0.89), oLCx 95, pRCA 30, mRCA 80 >> PCI: DES to LCx; DES to RCA // [id number] Trial participant (will DC Plavix after 30 d and remain on ASA + Apixaban)   Chest pain secondary to ablation, with expected mild elevation in troponin [id number]   Complication of anesthesia    BP dropped post colonoscopy   Coronary arteriosclerosis 02/24/2018   Essential hypertension 03/12/2018   Fracture of proximal phalanx of finger 10/24/2019   Hypercholesteremia    Hyperlipidemia 03/12/2018   Lung nodules 02/25/2019   Chest CT 01/2019: LUL nodule unchanged (benign); new LUL nodules 6 mm, aortic atherosclerosis, emphysema, partially calcified pleural plaques suggesting prior empyema or hemothorax    Obstructive sleep apnea    uses CPAP nightly   Pain of left hand 11/22/2019   Paroxysmal atrial fibrillation    persistent    Presence of Watchman left atrial appendage closure device 08/08/2021   Watchman 34mm with Dr. Quentin Ore   Secondary hypercoagulable state 04/05/2019   Wears glasses  Past Surgical History:  Procedure Laterality Date   ATRIAL FIBRILLATION ABLATION N/A 05/20/2018   Procedure: ATRIAL FIBRILLATION ABLATION;  Surgeon: Constance Haw, MD;  Location: [place] [facility];  Service: Cardiovascular;  Laterality: N/A;   ATRIAL FIBRILLATION ABLATION N/A 09/02/2019   Procedure: ATRIAL FIBRILLATION ABLATION;  Surgeon: Constance Haw, MD;  Location: [place] [facility];  Service: Cardiovascular;  Laterality: N/A;   CARDIOVERSION N/A 11/01/2015   Procedure: CARDIOVERSION;  Surgeon: Pixie Casino, MD;  Location[facility] ENDOSCOPY;  Service: Cardiovascular;  Laterality: N/A;   CARDIOVERSION N/A 11/23/2015   Procedure: CARDIOVERSION;  Surgeon: Will Meredith Leeds, MD;  Location: [place];  Service: Cardiovascular;  Laterality: N/A;   CARDIOVERSION N/A 10/12/2017   Procedure: CARDIOVERSION;  Surgeon: Pixie Casino, MD;  Location: [place];  Service: Cardiovascular;  Laterality: N/A;   CARDIOVERSION N/A 06/30/2018   Procedure: CARDIOVERSION;  Surgeon: Jerline Pain, MD;  Location: [place];  Service: Cardiovascular;  Laterality: N/A;   CARDIOVERSION N/A 09/22/2018   Procedure: CARDIOVERSION;  Surgeon: Sueanne Margarita, MD;  Location: [facility] ENDOSCOPY;  Service: Cardiovascular;  Laterality: N/A;   CARDIOVERSION N/A 07/15/2019   Procedure: CARDIOVERSION;  Surgeon: Buford Dresser, MD;  Location: [facility] ENDOSCOPY;  Service: Cardiovascular;  Laterality: N/A;   CARDIOVERSION N/A 12/07/2019   Procedure: CARDIOVERSION;  Surgeon: Skeet Latch, MD;  Location: [place];  Service: Cardiovascular;  Laterality: N/A;   CARDIOVERSION N/A 03/19/2020   Procedure: CARDIOVERSION;   Surgeon: Sueanne Margarita, MD;  Location: [facility] ENDOSCOPY;  Service: Cardiovascular;  Laterality: N/A;   COLONOSCOPY     CORONARY STENT INTERVENTION N/A 02/24/2018   Procedure: CORONARY STENT INTERVENTION;  Surgeon: Sherren Mocha, MD;  Location: [place] [facility];  Service: Cardiovascular;  Laterality: N/A;   I & D EXTREMITY Left 10/19/2019   Procedure: IRRIGATION AND DEBRIDEMENT LEFT SMALL PROXIMAL PHALANX;  Surgeon: Iran Planas, MD;  Location: [place];  Service: Orthopedics;  Laterality: Left;   INTRAVASCULAR PRESSURE WIRE/FFR STUDY N/A 02/24/2018   Procedure: INTRAVASCULAR PRESSURE WIRE/FFR STUDY;  Surgeon: Sherren Mocha, MD;  Location: [place] [facility];  Service: Cardiovascular;  Laterality: N/A;   KNEE ARTHROSCOPY  2000   LEFT ATRIAL APPENDAGE OCCLUSION N/A 08/08/2021   Procedure: LEFT ATRIAL APPENDAGE OCCLUSION;  Surgeon: Vickie Epley, MD;  Location: [place] [facility];  Service: Cardiovascular;  Laterality: N/A;   LEFT HEART CATH AND CORONARY ANGIOGRAPHY N/A 02/24/2018   Procedure: LEFT HEART CATH AND CORONARY ANGIOGRAPHY;  Surgeon: Sherren Mocha, MD;  Location: [place] [facility];  Service: Cardiovascular;  Laterality: N/A;   OPEN REDUCTION INTERNAL FIXATION (ORIF) PROXIMAL PHALANX Left 10/19/2019   Procedure: OPEN REDUCTION INTERNAL FIXATION (ORIF) PROXIMAL PHALANX WITH K-WIRES LEFT SMALL;  Surgeon: Iran Planas, MD;  Location: [place];  Service: Orthopedics;  Laterality: Left;   SHOULDER ARTHROSCOPY WITH ROTATOR CUFF REPAIR AND SUBACROMIAL DECOMPRESSION Right 07/15/2012   Procedure: RIGHT ARTHROSCOPY SHOULDER DECOMPRESSION  SUBACROMIAL PARTIAL ACROMIOPLASTY WITH CORACOACROMIAL RELEASE,  DISTAL CLAVICULECTOMY, resect biceps debride labrium WITH ROTATOR CUFF REPAIR;  Surgeon: Ninetta Lights, MD;  Location: [place];  Service: Orthopedics;  Laterality: Right;   TEE WITHOUT CARDIOVERSION N/A 08/08/2021   Procedure: TRANSESOPHAGEAL  ECHOCARDIOGRAM (TEE);  Surgeon: Vickie Epley, MD;  Location: [place] [facility];  Service: Cardiovascular;  Laterality: N/A;    Current Outpatient Medications:    acetaminophen (TYLENOL) 325 MG tablet, Take 325 mg by mouth every 6 (six) hours as needed (  for pain.). , Disp: , Rfl:    amiodarone (PACERONE) 100 MG tablet, Take 100 mg by mouth daily., Disp: , Rfl:    atorvastatin (LIPITOR) 40 MG tablet, Take 1 tablet (40 mg total) by mouth daily., Disp: 90 tablet, Rfl: 3   clopidogrel (PLAVIX) 75 MG tablet, Take 1 tablet (75 mg total) by mouth daily., Disp: 90 tablet, Rfl: 1   COVID-19 mRNA bivalent vaccine, Pfizer, (PFIZER COVID-19 VAC BIVALENT) injection, Inject into the muscle., Disp: 0.3 mL, Rfl: 0   furosemide (LASIX) 20 MG tablet, Take 1-2 tablets (20-40 mg total) by mouth daily as needed for fluid., Disp: , Rfl:    losartan (COZAAR) 100 MG tablet, TAKE 1 TABLET(100 MG) BY MOUTH DAILY, Disp: 90 tablet, Rfl: 2   metoprolol succinate (TOPROL-XL) 50 MG 24 hr tablet, Take 50 mg by mouth daily. Take with or immediately following a meal., Disp: , Rfl:    nitroGLYCERIN (NITROSTAT) 0.4 MG SL tablet, PLACE 1 TABLET UNDER THE TONGUE AS NEEDED FOR CHEST PAIN EVERY 5 MINUTES AS NEEDED, Disp: 25 tablet, Rfl: 9   RSV vaccine recomb adjuvanted (AREXVY) 120 MCG/0.5ML injection, Inject into the muscle., Disp: 0.5 mL, Rfl: 0   zaleplon (SONATA) 5 MG capsule, Take 5 mg by mouth at bedtime as needed for sleep., Disp: , Rfl:   Clinical Interview:   The following information was obtained during a clinical interview with Mr. Filkins prior to cognitive testing.  Cognitive Symptoms: Decreased short-term memory: Endorsed. He described a longstanding weakness recalling names. However, he stated that this seems to have progressively worsened over time, especially over the past several years. Examples included trouble recalling client names while in court, recalling names of famous individuals, and trouble recalling  the name of a restaurant which is very familiar to him. He generally denied rapid forgetting, trouble recalling details of past conversations, or frequently misplacing things around his environment.  Decreased long-term memory: Denied. Decreased attention/concentration: Denied. Reduced processing speed: Endorsed "some."  Difficulties with executive functions: Denied. He also denied trouble with impulsivity or any significant personality changes. He did acknowledge feeling a bit more irritable and short-tempered lately.  Difficulties with emotion regulation: Denied. Difficulties with receptive language: Denied. Difficulties with word finding: Endorsed. Decreased visuoperceptual ability: Denied.  Difficulties completing ADLs: Denied.  Additional Medical History: History of traumatic brain injury/concussion: While playing football in high school, he described the potential for a few head injuries. He described one event in particular where he likely sustained a concussion as he experienced dizziness and nausea after sustaining a direct hit to the head. No persisting difficulties were reported. No more recent head injuries were described.  History of stroke: Denied. History of seizure activity: Denied. History of known exposure to toxins: Denied. Symptoms of chronic pain: Endorsed. He reported ongoing knee pain. He also described symptoms of peripheral neuropathy in his feet, as well as a his hands.  Experience of frequent headaches/migraines: Denied. Frequent instances of dizziness/vertigo: Denied. Symptoms were said to occur occasionally and not be particularly problematic. These generally surround him standing up quickly.   Sensory changes: He wears glasses with benefit and completed cataract surgery several years prior. Other sensory changes/difficulties (e.g., hearing, taste, smell) were denied.  Balance/coordination difficulties: Endorsed. He described his balance as "not great" and  acknowledged several falls spread out of the past several years. Orthopedic injuries did accompany some of these falls. He was unsure if peripheral neuropathy may be contributing to these experiences. One side of the body  was not said to be worse than the other. No recent falls were reported. Other motor difficulties: Denied.  Sleep History: Estimated hours obtained each night: 5-6 hours.  Difficulties falling asleep: Denied. Difficulties staying asleep: Denied. Feels rested and refreshed upon awakening: Endorsed "generally."  History of snoring: Endorsed. History of waking up gasping for air: Endorsed. Witnessed breath cessation while asleep: Endorsed. He acknowledged a history of obstructive sleep apnea and utilizes his CPAP machine nightly with good benefit.   History of vivid dreaming: Denied. Excessive movement while asleep: Denied. Instances of acting out his dreams: Denied.  Psychiatric/Behavioral Health History: Depression: Denied. He described his current mood as "happy" and denied to his knowledge any prior mental health concerns or diagnoses surrounding a depressive disorder. Current and remote suicidal ideation, intent, or plan was denied.  Anxiety: Denied. He did report periods of heightened anxiety in the past but added that these experiences were appropriate given the acute stressor present at the time.  Mania: Denied. Trauma History: Denied. Visual/auditory hallucinations: Denied. Delusional thoughts: Denied.  Tobacco: Denied. Alcohol: He reported consuming 2-3 alcoholic beverages per night. Medical records do suggest that this represents a decrease from a previously higher level of consumption. He denied a history of problematic alcohol abuse or dependence.  Recreational drugs: Denied.  Family History: Problem Relation Age of Onset   Other Mother        bad fall   Clotting disorder Mother    Memory loss Mother        mild and with advanced age   Heart failure  Father    Cancer Sister    Hypertension Sister    Heart disease Brother    This information was confirmed by Mr. Meyerhofer.  Academic/Vocational History: Highest level of educational attainment: 19 years. He earned a Dietitian from [employer] and completed law school at [employer]. He described himself as a Production designer, theatre/television/film in academic settings. No relative weaknesses were identified.  History of developmental delay: Denied. History of grade repetition: Denied. Enrollment in special education courses: Denied. History of LD/ADHD: Denied.  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 He acknowledged some trouble recalling names of clients which has been "embarrassing" for him lately. He also reported feeling as though he completes his work in a slower and less efficient manner.   Evaluation Results:   Behavioral Observations: Mr. Etchells was unaccompanied, arrived to his appointment on time, and was appropriately dressed and groomed. He appeared alert and oriented. Observed gait and station were within normal limits. Gross motor functioning appeared intact upon informal observation and no abnormal movements (e.g., tremors) were noted. His affect was generally relaxed and positive, but did range appropriately given the subject being discussed during the clinical interview or the task at hand during testing procedures. Spontaneous speech was fluent and word finding difficulties were not observed during the clinical interview. Thought processes were coherent, organized, and normal in content. Insight into his cognitive difficulties appeared adequate.   During testing, sustained attention was appropriate. Task engagement was adequate and he persisted when challenged. Overall, Mr. Marrano was cooperative with the clinical interview and subsequent testing procedures.   Adequacy of Effort: The  validity of neuropsychological testing is limited by the extent to which the individual being tested may be assumed to have exerted adequate effort during testing. Mr. Robarts expressed his intention to perform to the best of his abilities  and exhibited adequate task engagement and persistence. Scores across stand-alone and embedded performance validity measures were within expectation. As such, the results of the current evaluation are believed to be a valid representation of Mr. Goodlet current cognitive functioning.  Test Results: Mr. Kruckeberg was fully oriented at the time of the current evaluation.  Intellectual abilities based upon educational and vocational attainment were estimated to be in the above average range. Premorbid abilities were estimated to be within the above average range based upon a single-word reading test.   Processing speed was below average to above average. Basic attention was average. More complex attention (e.g., working memory) was above average. Executive functioning was largely average to above average. A few isolated below average performances were exhibited across an unstructured task assessing pattern recognition and adaptability. Performance across a task assessing safety and judgment was well above average.   Assessed receptive language abilities were above average. Likewise, Mr. Purucker did not exhibit any difficulties comprehending task instructions and answered all questions asked of him appropriately. Assessed expressive language (e.g., verbal fluency and confrontation naming) was average to exceptionally high.     Assessed visuospatial/visuoconstructional abilities were average to above average.    Learning (i.e., encoding) of novel verbal and visual information was average to well above average. Spontaneous delayed recall (i.e., retrieval) of previously learned information was below average to average. Retention rates were 88% across a story learning task,  100% across a list learning task, 76% across a daily living task, and 50% across a shape learning task. Performance across recognition tasks was average, suggesting evidence for information consolidation.   Results of emotional screening instruments suggested that recent symptoms of generalized anxiety were in the mild range, while symptoms of depression were within normal limits. A screening instrument assessing recent sleep quality suggested the presence of minimal sleep dysfunction.  Tables of Scores:   Note: This summary of test scores accompanies the interpretive report and should not be considered in isolation without reference to the appropriate sections in the text. Descriptors are based on appropriate normative data and may be adjusted based on clinical judgment. Terms such as "Within Normal Limits" and "Outside Normal Limits" are used when a more specific description of the test score cannot be determined.       Percentile - Normative Descriptor > 98 - Exceptionally High 91-97 - Well Above Average 75-90 - Above Average 25-74 - Average 9-24 - Below Average 2-8 - Well Below Average < 2 - Exceptionally Low       Orientation:      Raw Score Percentile   NAB Orientation, Form 1 29/29 --- ---       Cognitive Screening:      Raw Score Percentile   SLUMS: 22/30 --- ---       Intellectual Functioning:      Standard Score Percentile   Test of Premorbid Functioning: 118 88 Above Average       Memory:     NAB Memory Module, Form 1: Standard Score/ T Score Percentile   Total Memory Index 95 37 Average  List Learning       Total Trials 1-3 24/36 (54) 66 Average    List B 5/12 (56) 73 Average    Short Delay Free Recall 4/12 (35) 7 Well Below Average    Long Delay Free Recall 4/12 (37) 9 Below Average    Recognition Discriminability 8 (56) 73 Average  Shape Learning       Total Trials 1-3  16/27 (54) 66 Average    Delayed Recall 4/9 (41) 18 Below Average    Recognition  Discriminability 5 (44) 27 Average  Story Learning       Immediate Recall 62/80 (52) 58 Average    Delayed Recall 30/40 (47) 38 Average  Daily Living Memory       Immediate Recall 48/51 (66) 95 Well Above Average    Delayed Recall 13/17 (47) 38 Average    Recognition Hits 9/10 (55) 69 Average       Attention/Executive Function:     Trail Making Test (TMT): Raw Score (T Score) Percentile     Part A 48 secs.,  0 errors (39) 14 Below Average    Part B 85 secs.,  1 error (51) 54 Average         Scaled Score Percentile   WAIS-IV Coding: 12 75 Above Average       NAB Attention Module, Form 1: T Score Percentile     Digits Forward 45 31 Average    Digits Backwards 59 82 Above Average       D-KEFS Color-Word Interference Test: Raw Score (Scaled Score) Percentile     Color Naming 35 secs. (9) 37 Average    Word Reading 24 secs. (11) 63 Average    Inhibition 76 secs. (10) 50 Average      Total Errors 0 errors (13) 84 Above Average    Inhibition/Switching 82 secs. (10) 50 Average      Total Errors 3 errors (10) 50 Average       D-KEFS Verbal Fluency Test: Raw Score (Scaled Score) Percentile     Letter Total Correct 66 (19) >99 Exceptionally High    Category Total Correct 39 (12) 75 Above Average    Category Switching Total Correct 14 (12) 75 Above Average    Category Switching Accuracy 13 (12) 75 Above Average      Total Set Loss Errors 0 (13) 84 Above Average      Total Repetition Errors 8 (5) 5 Well Below Average       Wisconsin Card Sorting Test: Raw Score Percentile     Categories (trials) 1 (64) 11-16 Below Average    Total Errors 21 54 Average    Perseverative Errors 7 >99 Exceptionally High    Non-Perseverative Errors 14 14 Below Average    Failure to Maintain Set 2 --- ---       NAB Executive Functions Module, Form 1: T Score Percentile     Judgment 64 92 Well Above Average       Language:     Verbal Fluency Test: Raw Score (T Score) Percentile     Phonemic Fluency  (FAS) 66 (69) 97 Well Above Average    Animal Fluency 17 (46) 34 Average        NAB Language Module, Form 1: T Score Percentile     Auditory Comprehension 57 75 Above Average    Naming 31/31 (59) 82 Above Average       Visuospatial/Visuoconstruction:      Raw Score Percentile   Clock Drawing: 10/10 --- Within Normal Limits       NAB Spatial Module, Form 1: T Score Percentile     Figure Drawing Copy 44 27 Average        Scaled Score Percentile   WAIS-IV Block Design: 13 84 Above Average       Mood and Personality:      Raw Score Percentile   Geriatric  Depression Scale: 8 --- Within Normal Limits  Geriatric Anxiety Scale: 17 --- Mild    Somatic 7 --- Mild    Cognitive 3 --- Mild    Affective 7 --- Moderate       Additional Questionnaires:      Raw Score Percentile   PROMIS Sleep Disturbance Questionnaire: 21 --- None to Slight   Informed Consent and Coding/Compliance:   The current evaluation represents a clinical evaluation for the purposes previously outlined by the referral source and is in no way reflective of a forensic evaluation.   Mr. Leasure was provided with a verbal description of the nature and purpose of the present neuropsychological evaluation. Also reviewed were the foreseeable risks and/or discomforts and benefits of the procedure, limits of confidentiality, and mandatory reporting requirements of this provider. The patient was given the opportunity to ask questions and receive answers about the evaluation. Oral consent to participate was provided by the patient.   This evaluation was conducted by Christia Reading, Ph.D., ABPP-CN, board certified clinical neuropsychologist. Mr. Trueba completed a clinical interview with Dr. Melvyn Novas, billed as one unit [phone], and 140 minutes of cognitive testing and scoring, billed as one unit [phone] and four additional units [id number]. Psychometrist Milana Kidney, B.S., assisted Dr. Melvyn Novas with test administration and scoring procedures. As  a separate and discrete service, Dr. Melvyn Novas spent a total of 160 minutes in interpretation and report writing billed as one unit [phone] and two units 96133.

## 2022-02-24 ENCOUNTER — Ambulatory Visit: Payer: Medicare Other | Admitting: Psychology

## 2022-02-24 DIAGNOSIS — F09 Unspecified mental disorder due to known physiological condition: Secondary | ICD-10-CM | POA: Diagnosis not present

## 2022-02-24 NOTE — Progress Notes (Signed)
   Neuropsychology Feedback Session Ronald Hull. [facility]  Reason for Referral:   Ronald Hull is a 76 y.o. right-handed Caucasian male referred by Ronald Hull, D.O., to characterize his current cognitive functioning and assist with diagnostic clarity and treatment planning in the context of subjective cognitive decline.   Feedback:   Ronald Hull completed a comprehensive neuropsychological evaluation on 02/17/2022. Please refer to that encounter for the full report and recommendations. Briefly, results suggested neuropsychological functioning within normal limits relative to age-matched peers. All assessed cognitive domains were appropriate and no consistent impairment was exhibited based upon these comparisons. This includes processing speed, attention/concentration, executive functioning, safety/judgment, receptive and expressive language, visuospatial abilities, and learning and memory. Despite this, when utilizing norms that take into account Ronald Hull suspected premorbid intellectual abilities and educational attainment, a few weaknesses were exhibited, namely delayed retrieval of information across list and shape learning tasks, as well as across an unstructured task assessing pattern recognition and adaptability. It is important to consider that there is no testing available for comparison purposes. While these weaknesses could suggest very early and very mild cognitive decline, they could also simply reflect normal intraindividual variability and longstanding/unchanged weaknesses. There is no pattern of performance across the current evaluation which is suggestive of early stages of Alzheimer's disease or another neurodegenerative illness. Outside of the normal aging process, Ronald Hull did report mild psychiatric distress (largely anxiety), elevated alcohol consumption (91-47 alcoholic beverages per week), and several cardiovascular medical  ailments (i.e., persistent atrial fibrillation, coronary artery disease, essential hypertension, hyperlipidemia). Recent neuroimaging also revealed mild atrophy and microvascular ischemic changes. When combined, all of these variables can influence cognitive abilities and create/maintain mild cognitive inefficiencies. Continued medical monitoring will be important moving forward.  Ronald Hull was unaccompanied during the current feedback session. Content of the current session focused on the results of his neuropsychological evaluation. Ronald Hull was given the opportunity to ask questions and his questions were answered. He was encouraged to reach out should additional questions arise. A copy of his report was provided at the conclusion of the visit.      20 minutes were spent conducting the current feedback session with Ronald Hull, billed as one unit [phone].

## 2022-02-26 ENCOUNTER — Encounter: Payer: Self-pay | Admitting: Neurology

## 2022-02-26 ENCOUNTER — Ambulatory Visit: Payer: Medicare Other | Admitting: Neurology

## 2022-02-26 VITALS — BP 146/77 | HR 55 | Ht 71.0 in | Wt 277.0 lb

## 2022-02-26 DIAGNOSIS — G629 Polyneuropathy, unspecified: Secondary | ICD-10-CM

## 2022-02-26 DIAGNOSIS — R202 Paresthesia of skin: Secondary | ICD-10-CM

## 2022-02-26 NOTE — Patient Instructions (Signed)
Return to clinic in 1 year.

## 2022-02-26 NOTE — Progress Notes (Signed)
Follow-up Visit   Date: 02/26/22   Ronald Hull MRN: [id number] DOB: Jul 13, 1945   Interim History: Ronald Hull is a 76 y.o. right-handed Caucasian male with CAD, atrial fibrllation, and hypertension returning to the clinic for follow-up of neuropathy affecting the feet.  The patient was accompanied to the clinic by self.  History of present illness: Starting around 2021, he began having sensation that his feet are wrapped in gauze or walking on sand.  Symptoms became more bothersome since the summer of 2022.  He denies burning or tingling.  It involves the soles and balls of the feet.  Symptoms are noticeable when he is resting or in bed.  Balance is getting worse for sometime.  He has fallen several times this year, one with injury.  He does not use a cane and has not done PT.    He lives at home with his wife in a 2-level home.  He works as a Estate manager/land agent.  He drinks 2 glasses wine per day.  He was previously drinking 4 glasses wine nightly prior to two years ago.    Vitamin B12 level was 257. He is not taking multivitamin or supplements.  NCS/EMG of the legs performed by Dr. Neale Burly  shows chronic sensorimotor axonal polyneuropathy.   UPDATE 06/12/2021:  He is here for follow-up.  There has been no change in neuropathy, which remains restricted to the feet.  He has reduced sensation, no pain.  Balance is fair.  He missed PT appointment due to travel interruption secondary to weather, and when he tried to call back there was no answer. He is still interested in doing PT.  He walks unassisted, no falls.    Lately, he has noticed being more forgetful, especially with names.  He works as a Engineer, water and has not had any problems keeping up with work duties.  He manages finances, medications, and denies problems with navigation.    UPDATE 02/26/2022:  He is here for follow-up.  No change in neuropathy in the feet. His balance has improved after starting daily  walking regiment.  He walks around his home and tries to walk 7 miles/day.  He walks unassisted.   He has noticed new numbness in the hands upon wakening, which occurs several times per week.  No hand weakness.    He continues to work 3-days per week.   He continues to feel forgetful at times.  Formal neuropsychological testing from October 2023 was normal.   Medications:  Current Outpatient Medications on File Prior to Visit  Medication Sig Dispense Refill   acetaminophen (TYLENOL) 325 MG tablet Take 325 mg by mouth every 6 (six) hours as needed (for pain.).      amiodarone (PACERONE) 100 MG tablet Take 100 mg by mouth daily.     atorvastatin (LIPITOR) 40 MG tablet Take 1 tablet (40 mg total) by mouth daily. 90 tablet 3   COVID-19 mRNA bivalent vaccine, Pfizer, (PFIZER COVID-19 VAC BIVALENT) injection Inject into the muscle. 0.3 mL 0   furosemide (LASIX) 20 MG tablet Take 1-2 tablets (20-40 mg total) by mouth daily as needed for fluid.     losartan (COZAAR) 100 MG tablet TAKE 1 TABLET(100 MG) BY MOUTH DAILY 90 tablet 2   metoprolol succinate (TOPROL-XL) 50 MG 24 hr tablet Take 50 mg by mouth daily. Take with or immediately following a meal.     nitroGLYCERIN (NITROSTAT) 0.4 MG SL tablet PLACE 1 TABLET UNDER  THE TONGUE AS NEEDED FOR CHEST PAIN EVERY 5 MINUTES AS NEEDED 25 tablet 9   RSV vaccine recomb adjuvanted (AREXVY) 120 MCG/0.5ML injection Inject into the muscle. 0.5 mL 0   zaleplon (SONATA) 5 MG capsule Take 5 mg by mouth at bedtime as needed for sleep.     No current facility-administered medications on file prior to visit.    Allergies:  Allergies  Allergen Reactions   Lisinopril Cough    Vital Signs:  BP (!) 146/77   Pulse (!) 55   Ht 5\' 11"  (1.803 m)   Wt 277 lb (125.6 kg)   SpO2 95%   BMI 38.63 kg/m   Neurological Exam: MENTAL STATUS including orientation to time, place, person, recent and remote memory, attention span and concentration, language, and fund of  knowledge is normal.  Speech is not dysarthric.    06/12/2021    9:00 AM  Montreal Cognitive Assessment   Visuospatial/ Executive (0/5) 5  Naming (0/3) 3  Attention: Read list of digits (0/2) 2  Attention: Read list of letters (0/1) 1  Attention: Serial 7 subtraction starting at 100 (0/3) 3  Language: Repeat phrase (0/2) 2  Language : Fluency (0/1) 1  Abstraction (0/2) 2  Delayed Recall (0/5) 5  Orientation (0/6) 6  Total 30  Adjusted Score (based on education) 30   CRANIAL NERVES:   Normal conjugate, extra-ocular eye movements in all directions of gaze.  No ptosis.    MOTOR:  Motor strength is 5/5 in all extremities, including distally.  No atrophy, fasciculations or abnormal movements.  No pronator drift.  Tone is normal.    MSRs:  Reflexes are 2+/4 throughout, except absent at the ankles.  SENSORY:  Vibration is reduced at the ankle and toes, but still perceivable.  Vibration normal at the knees. Rhomberg test is negative.  COORDINATION/GAIT:   Gait wide-based and stable, unassisted.  Stressed and tandem gait is intact.  Data: Labs 02/08/2021:  copper 122, thiamine 126, folate 4.8, SPEP with IFE no M protein  NCS/EMG of the legs 12/19/2020:  Chronic sensorimotor axonal and demyelinating neuropathy.  Neuropsychological testing 02/17/22:  Normal    IMPRESSION/PLAN: Peripheral neuropathy affecting the feet, risk factors:  history of alcohol use, low B12, age.  Stable  - Continue to monitor  - Balance has improved with home exercise  - If symptoms get worse, refer to PT for balance training  Memory changes, subjective.  Neuropsychological testing as been normal.  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    - NCS/EMG of the arms, if symptoms get worse  - Avoid hyperflexion at the wrist and elbows when sleeping  Return to clinic in 1 year  Thank you for allowing me to participate in patient's care.  If I can answer any additional questions, I would be  pleased to do so.    Sincerely,    Dorthey Depace K. Posey Pronto, DO

## 2022-03-13 DIAGNOSIS — I10 Essential (primary) hypertension: Secondary | ICD-10-CM | POA: Diagnosis not present

## 2022-03-13 DIAGNOSIS — Z0000 Encounter for general adult medical examination without abnormal findings: Secondary | ICD-10-CM | POA: Diagnosis not present

## 2022-03-13 DIAGNOSIS — I4891 Unspecified atrial fibrillation: Secondary | ICD-10-CM | POA: Diagnosis not present

## 2022-03-13 DIAGNOSIS — G629 Polyneuropathy, unspecified: Secondary | ICD-10-CM | POA: Diagnosis not present

## 2022-03-13 DIAGNOSIS — E7800 Pure hypercholesterolemia, unspecified: Secondary | ICD-10-CM | POA: Diagnosis not present

## 2022-03-23 ENCOUNTER — Other Ambulatory Visit: Payer: Self-pay | Admitting: Cardiology

## 2022-04-05 DIAGNOSIS — G4733 Obstructive sleep apnea (adult) (pediatric): Secondary | ICD-10-CM | POA: Diagnosis not present

## 2022-04-29 DIAGNOSIS — G4733 Obstructive sleep apnea (adult) (pediatric): Secondary | ICD-10-CM | POA: Diagnosis not present

## 2022-04-29 DIAGNOSIS — I4891 Unspecified atrial fibrillation: Secondary | ICD-10-CM | POA: Diagnosis not present

## 2022-04-29 DIAGNOSIS — I10 Essential (primary) hypertension: Secondary | ICD-10-CM | POA: Diagnosis not present

## 2022-05-06 DIAGNOSIS — G4733 Obstructive sleep apnea (adult) (pediatric): Secondary | ICD-10-CM | POA: Diagnosis not present

## 2022-05-13 DIAGNOSIS — U071 COVID-19: Secondary | ICD-10-CM | POA: Diagnosis not present

## 2022-06-05 ENCOUNTER — Encounter (HOSPITAL_COMMUNITY): Payer: Self-pay | Admitting: *Deleted

## 2022-07-04 ENCOUNTER — Other Ambulatory Visit: Payer: Self-pay | Admitting: Cardiology

## 2022-07-31 ENCOUNTER — Other Ambulatory Visit (INDEPENDENT_AMBULATORY_CARE_PROVIDER_SITE_OTHER): Payer: Self-pay | Admitting: Cardiology

## 2022-07-31 ENCOUNTER — Telehealth: Payer: Self-pay

## 2022-07-31 NOTE — Telephone Encounter (Signed)
The patient reports he thought his visit 4/10 was supposed to be with Dr. Curt Bears. When he was told it was with Kathyrn Drown for 1 year Watchman visit, he was frustrated. Spoke with EP scheduler. Cancelled appointment with Sharee Pimple and scheduled the patient with Dr. Curt Bears 08/07/2022. He was grateful for call and agreed with plan.

## 2022-08-01 ENCOUNTER — Other Ambulatory Visit: Payer: Self-pay

## 2022-08-01 MED ORDER — FUROSEMIDE 20 MG PO TABS: 20.0000 mg | ORAL_TABLET | Freq: Every day | ORAL | 6 refills | Status: DC | PRN

## 2022-08-01 NOTE — Telephone Encounter (Signed)
Pt is requesting refill on furosemide 20 mg tablet. This medication has not been refilled since 2021. Would Dr. Mayford Knife like to refill this medication? Please address

## 2022-08-06 ENCOUNTER — Ambulatory Visit: Payer: Medicare Other

## 2022-08-07 ENCOUNTER — Encounter: Payer: Self-pay | Admitting: Cardiology

## 2022-08-07 ENCOUNTER — Ambulatory Visit: Payer: Medicare Other | Attending: Cardiology | Admitting: Cardiology

## 2022-08-07 VITALS — BP 130/88 | HR 51 | Ht 71.0 in | Wt 280.0 lb

## 2022-08-07 DIAGNOSIS — D6869 Other thrombophilia: Secondary | ICD-10-CM

## 2022-08-07 DIAGNOSIS — I4819 Other persistent atrial fibrillation: Secondary | ICD-10-CM | POA: Diagnosis not present

## 2022-08-07 DIAGNOSIS — I10 Essential (primary) hypertension: Secondary | ICD-10-CM | POA: Diagnosis not present

## 2022-08-07 DIAGNOSIS — I2510 Atherosclerotic heart disease of native coronary artery without angina pectoris: Secondary | ICD-10-CM

## 2022-08-07 DIAGNOSIS — Z79899 Other long term (current) drug therapy: Secondary | ICD-10-CM | POA: Diagnosis not present

## 2022-08-07 DIAGNOSIS — G4733 Obstructive sleep apnea (adult) (pediatric): Secondary | ICD-10-CM

## 2022-08-07 MED ORDER — METOPROLOL SUCCINATE ER 25 MG PO TB24: 25.0000 mg | ORAL_TABLET | Freq: Every day | ORAL | 3 refills | Status: DC

## 2022-08-07 NOTE — Progress Notes (Signed)
Electrophysiology Office Note   Date:  08/07/2022   ID:  Ronald Hull, DOB 22-Jul-1945, MRN [id number]  PCP:  Renford Dills, MD   Primary Electrophysiologist:  Regan Lemming, MD    No chief complaint on file.     History of Present Illness: Ronald Hull is a 77 y.o. male who presents today for electrophysiology evaluation.     He has a history seen for atrial fibrillation.  He presented to his primary physician in 2017 with irregular and elevated heart rate.  He had a cardioversion but went back into atrial fibrillation.  He is post ablation 09/12/2019.  Preablation he was found to have coronary artery disease and is post DES to the RCA and circumflex.  He was having bruising and is now status post watchman implant.    Today, denies symptoms of palpitations, chest pain, shortness of breath, orthopnea, PND, lower extremity edema, claudication, dizziness, presyncope, syncope, bleeding, or neurologic sequela. The patient is tolerating medications without difficulties.  Since being seen he has done well.  He has noted increasing fatigue towards the middle to end of the day.  He says that his heart rates have been slow during this time into the 40s.  Aside from that, he has no major complaints.   Past Medical History:  Diagnosis Date   Arthritis    Bronchitis 05/23/2018   CAD (coronary artery disease) 02/24/2018   LHC 10/19: pLAD 50, mLAD 50 (dFFR=0.89), oLCx 95, pRCA 30, mRCA 80 >> PCI: DES to LCx; DES to RCA // [id number] Trial participant (Azriel Jakob DC Plavix after 30 d and remain on ASA + Apixaban)   Chest pain secondary to ablation, with expected mild elevation in troponin 05/22/2018   Complication of anesthesia    BP dropped post colonoscopy   Coronary arteriosclerosis 02/24/2018   Essential hypertension 03/12/2018   Fracture of proximal phalanx of finger 10/24/2019   Hypercholesteremia    Hyperlipidemia 03/12/2018   Lung nodules 02/25/2019   Chest CT 01/2019: LUL  nodule unchanged (benign); new LUL nodules 6 mm, aortic atherosclerosis, emphysema, partially calcified pleural plaques suggesting prior empyema or hemothorax   Obstructive sleep apnea    uses CPAP nightly   Pain of left hand 11/22/2019   Paroxysmal atrial fibrillation    persistent    Presence of Watchman left atrial appendage closure device 08/08/2021   Watchman 76mm with Dr. Lalla Brothers   Secondary hypercoagulable state 04/05/2019   Wears glasses    Past Surgical History:  Procedure Laterality Date   ATRIAL FIBRILLATION ABLATION N/A 05/20/2018   Procedure: ATRIAL FIBRILLATION ABLATION;  Surgeon: Regan Lemming, MD;  Location: [facility];  Service: Cardiovascular;  Laterality: N/A;   ATRIAL FIBRILLATION ABLATION N/A 09/02/2019   Procedure: ATRIAL FIBRILLATION ABLATION;  Surgeon: Regan Lemming, MD;  Location: [facility];  Service: Cardiovascular;  Laterality: N/A;   CARDIOVERSION N/A 11/01/2015   Procedure: CARDIOVERSION;  Surgeon: [name], MD;  Location: [facility] ENDOSCOPY;  Service: Cardiovascular;  Laterality: N/A;   CARDIOVERSION N/A 11/23/2015   Procedure: CARDIOVERSION;  Surgeon: Oluwatomisin Hustead [name], MD;  Location: [facility] ENDOSCOPY;  Service: Cardiovascular;  Laterality: N/A;   CARDIOVERSION N/A 10/12/2017   Procedure: CARDIOVERSION;  Surgeon: [name], MD;  Location: [facility] ENDOSCOPY;  Service: Cardiovascular;  Laterality: N/A;   CARDIOVERSION N/A 06/30/2018   Procedure: CARDIOVERSION;  Surgeon: Jake Bathe, MD;  Location: [facility] ENDOSCOPY;  Service: Cardiovascular;  Laterality: N/A;   CARDIOVERSION N/A 09/22/2018  Procedure: CARDIOVERSION;  Surgeon: Quintella Reichert, MD;  Location: [facility] ENDOSCOPY;  Service: Cardiovascular;  Laterality: N/A;   CARDIOVERSION N/A 07/15/2019   Procedure: CARDIOVERSION;  Surgeon: Jodelle Red, MD;  Location: [facility] ENDOSCOPY;  Service: Cardiovascular;  Laterality: N/A;   CARDIOVERSION N/A 12/07/2019   Procedure: CARDIOVERSION;   Surgeon: Chilton Si, MD;  Location: [facility] ENDOSCOPY;  Service: Cardiovascular;  Laterality: N/A;   CARDIOVERSION N/A 03/19/2020   Procedure: CARDIOVERSION;  Surgeon: Quintella Reichert, MD;  Location: [facility] ENDOSCOPY;  Service: Cardiovascular;  Laterality: N/A;   COLONOSCOPY     CORONARY PRESSURE/FFR STUDY N/A 02/24/2018   Procedure: INTRAVASCULAR PRESSURE WIRE/FFR STUDY;  Surgeon: Tonny Bollman, MD;  Location: [facility] INVASIVE CV LAB;  Service: Cardiovascular;  Laterality: N/A;   CORONARY STENT INTERVENTION N/A 02/24/2018   Procedure: CORONARY STENT INTERVENTION;  Surgeon: Tonny Bollman, MD;  Location: [facility] INVASIVE CV LAB;  Service: Cardiovascular;  Laterality: N/A;   I & D EXTREMITY Left 10/19/2019   Procedure: IRRIGATION AND DEBRIDEMENT LEFT SMALL PROXIMAL PHALANX;  Surgeon: Bradly Bienenstock, MD;  Location: [facility];  Service: Orthopedics;  Laterality: Left;   KNEE ARTHROSCOPY  2000   LEFT ATRIAL APPENDAGE OCCLUSION N/A 08/08/2021   Procedure: LEFT ATRIAL APPENDAGE OCCLUSION;  Surgeon: Lanier Prude, MD;  Location: [facility];  Service: Cardiovascular;  Laterality: N/A;   LEFT HEART CATH AND CORONARY ANGIOGRAPHY N/A 02/24/2018   Procedure: LEFT HEART CATH AND CORONARY ANGIOGRAPHY;  Surgeon: Tonny Bollman, MD;  Location: [facility] INVASIVE CV LAB;  Service: Cardiovascular;  Laterality: N/A;   OPEN REDUCTION INTERNAL FIXATION (ORIF) PROXIMAL PHALANX Left 10/19/2019   Procedure: OPEN REDUCTION INTERNAL FIXATION (ORIF) PROXIMAL PHALANX WITH K-WIRES LEFT SMALL;  Surgeon: Bradly Bienenstock, MD;  Location: [facility];  Service: Orthopedics;  Laterality: Left;   SHOULDER ARTHROSCOPY WITH ROTATOR CUFF REPAIR AND SUBACROMIAL DECOMPRESSION Right 07/15/2012   Procedure: RIGHT ARTHROSCOPY SHOULDER DECOMPRESSION  SUBACROMIAL PARTIAL ACROMIOPLASTY WITH CORACOACROMIAL RELEASE,  DISTAL CLAVICULECTOMY, resect biceps debride labrium WITH ROTATOR CUFF REPAIR;  Surgeon: [name], MD;   Location: [facility];  Service: Orthopedics;  Laterality: Right;   TEE WITHOUT CARDIOVERSION N/A 08/08/2021   Procedure: TRANSESOPHAGEAL ECHOCARDIOGRAM (TEE);  Surgeon: Lanier Prude, MD;  Location: [facility] INVASIVE CV LAB;  Service: Cardiovascular;  Laterality: N/A;     Current Outpatient Medications  Medication Sig Dispense Refill   acetaminophen (TYLENOL) 325 MG tablet Take 325 mg by mouth every 6 (six) hours as needed (for pain.).      amiodarone (PACERONE) 100 MG tablet Take 100 mg by mouth daily.     aspirin EC 81 MG tablet Take 81 mg by mouth daily. Swallow whole.     atorvastatin (LIPITOR) 40 MG tablet Take 1 tablet (40 mg total) by mouth daily. 90 tablet 3   COVID-19 mRNA bivalent vaccine, Pfizer, (PFIZER COVID-19 VAC BIVALENT) injection Inject into the muscle. 0.3 mL 0   furosemide (LASIX) 20 MG tablet Take 1-2 tablets (20-40 mg total) by mouth daily as needed for fluid. 60 tablet 6   losartan (COZAAR) 100 MG tablet Take 1 tablet (100 mg total) by mouth daily. Pt needs to keep upcoming appt in April for further refills 90 tablet 0   metoprolol succinate (TOPROL XL) 25 MG 24 hr tablet Take 1 tablet (25 mg total) by mouth daily. 90 tablet 3   nitroGLYCERIN (NITROSTAT) 0.4 MG SL tablet PLACE 1 TABLET UNDER THE TONGUE AS NEEDED FOR CHEST PAIN EVERY 5  MINUTES AS NEEDED 25 tablet 9   RSV vaccine recomb adjuvanted (AREXVY) 120 MCG/0.5ML injection Inject into the muscle. 0.5 mL 0   zaleplon (SONATA) 5 MG capsule Take 5 mg by mouth at bedtime as needed for sleep.     No current facility-administered medications for this visit.    Allergies:   Lisinopril   Social History:  The patient  reports that he quit smoking about 49 years ago. His smoking use included cigarettes. He has a 20.00 pack-year smoking history. He has never used smokeless tobacco. He reports that he does not currently use alcohol after a past usage of about 14.0 - 21.0 standard drinks of alcohol per week. He  reports that he does not use drugs.   Family History:  The patient's family history includes Cancer in his sister; Clotting disorder in his mother; Heart disease in his brother; Heart failure in his father; Hypertension in his sister; Memory loss in his mother; Other in his mother.   ROS:  Please see the history of present illness.   Otherwise, review of systems is positive for none.   All other systems are reviewed and negative.   PHYSICAL EXAM: VS:  BP 130/88   Pulse (!) 51   Ht 5\' 11"  (1.803 m)   Wt 280 lb (127 kg)   SpO2 97%   BMI 39.05 kg/m  , BMI Body mass index is 39.05 kg/m. GEN: Well nourished, well developed, in no acute distress  HEENT: normal  Neck: no JVD, carotid bruits, or masses Cardiac: RRR; no murmurs, rubs, or gallops,no edema  Respiratory:  clear to auscultation bilaterally, normal work of breathing GI: soft, nontender, nondistended, + BS MS: no deformity or atrophy  Skin: warm and dry Neuro:  Strength and sensation are intact Psych: euthymic mood, full affect  EKG:  EKG is ordered today. Personal review of the ekg ordered shows sinus rhythm   Recent Labs: 11/26/2021: BUN 25; Creatinine, Ser 1.13; Hemoglobin 13.9; Platelets 172; Potassium 5.0; Sodium 142    Lipid Panel     Component Value Date/Time   CHOL 96 (L) 04/09/2018 0959   TRIG 77 04/09/2018 0959   HDL 31 (L) 04/09/2018 0959   CHOLHDL 3.1 04/09/2018 0959   LDLCALC 50 04/09/2018 0959     Wt Readings from Last 3 Encounters:  08/07/22 280 lb (127 kg)  02/26/22 277 lb (125.6 kg)  02/03/22 280 lb 3.2 oz (127.1 kg)      Other studies Reviewed: Additional studies/ records that were reviewed today include: TTE 09/2015 - Left ventricle: The cavity size was normal. Wall thickness was   increased in a pattern of moderate LVH. Systolic function was   normal. The estimated ejection fraction was in the range of 55%   to 60%. Indeterminant diastolic function. Wall motion was normal;   there were no  regional wall motion abnormalities. - Aortic valve: There was no stenosis. There was trivial   regurgitation. - Mitral valve: Mildly calcified annulus. There was mild   regurgitation. - Left atrium: The atrium was mildly dilated. - Right ventricle: The cavity size was normal. Systolic function   was normal. - Right atrium: The atrium was mildly dilated. - Tricuspid valve: Peak RV-RA gradient (S): 26 mm Hg. - Pulmonary arteries: PA peak pressure: 29 mm Hg (S). - Inferior vena cava: The vessel was normal in size. The   respirophasic diameter changes were in the normal range (= 50%),   consistent with normal central venous pressure.  LHC 02/24/18 Mid RCA lesion is 80% stenosed. Prox RCA lesion is 30% stenosed. Ost Cx to Prox Cx lesion is 95% stenosed. Prox LAD lesion is 50% stenosed. Prox LAD to Mid LAD lesion is 50% stenosed. A drug-eluting stent was successfully placed using a STENT SIERRA 3.50 X 15 MM. Post intervention, there is a 0% residual stenosis. A drug-eluting stent was successfully placed using a STENT SIERRA 4.00 X 15 MM. Post intervention, there is a 0% residual stenosis.   ASSESSMENT AND PLAN:  1.  Persistent atrial fibrillation: Currently on Amio rhythm.  CHA2DS2-VASc of 4.  Status post watchman implant.  He has had some increased fatigue towards the middle of the day.  He says that his heart rate is slow.  Ethyle Tiedt reduce Toprol-XL to 25 mg.  2.  Hypertension: Currently well-controlled  3.  Obstructive sleep apnea: CPAP compliance encouraged  4.  Morbid obesity: Lifestyle modification encouraged Body mass index is 39.05 kg/m.  5.  Coronary artery disease: Status post DES to the circumflex and RCA.  Has residual LAD disease.  Has been taken off of Eliquis.  Kaizlee Carlino restart aspirin.  6.  Second hypercoagulable state: Currently post watchman for atrial fibrillation  7.  High risk medication monitoring: Currently on amiodarone.  Shreya Lacasse check TSH and LFTs  today.   Current medicines are reviewed at length with the patient today.   The patient does not have concerns regarding his medicines.  The following changes were made today: Reduce Toprol-XL  Labs/ tests ordered today include:  Orders Placed This Encounter  Procedures   TSH   Hepatic function panel   EKG 12-Lead     Disposition:   FU 6 months  Signed, Zarra Geffert [name], MD  08/07/2022 10:19 AM     [facility] [address] [place] [address] [phone] (office) [phone] (fax)

## 2022-08-07 NOTE — Patient Instructions (Addendum)
Medication Instructions:  Your physician has recommended you make the following change in your medication:  DECREASE Toprol to 25 mg daily  *If you need a refill on your cardiac medications before your next appointment, please call your pharmacy*   Lab Work: Today: CMET, CBC & TSH If you have labs (blood work) drawn today and your tests are completely normal, you will receive your results only by: MyChart Message (if you have MyChart) OR A paper copy in the mail If you have any lab test that is abnormal or we need to change your treatment, we will call you to review the results.   Testing/Procedures: None ordered   Follow-Up: At [facility], you and your health needs are our priority.  As part of our continuing mission to provide you with exceptional heart care, we have created designated Provider Care Teams.  These Care Teams include your primary Cardiologist (physician) and Advanced Practice Providers (APPs -  Physician Assistants and Nurse Practitioners) who all work together to provide you with the care you need, when you need it.  Your next appointment:   6 month(s)  The format for your next appointment:   In Person  Provider:   You will see one of the following Advanced Practice Providers on your designated Care Team:   Francis Dowse, [place] Casimiro Needle "Mardelle Matte" Lanna Poche, [place]   Thank you for choosing [facility]!!   Dory Horn, RN [phone]

## 2022-08-08 LAB — HEPATIC FUNCTION PANEL
ALT: 22 IU/L (ref 0–44)
AST: 18 IU/L (ref 0–40)
Albumin: 3.9 g/dL (ref 3.8–4.8)
Alkaline Phosphatase: 93 IU/L (ref 44–121)
Bilirubin Total: 0.7 mg/dL (ref 0.0–1.2)
Bilirubin, Direct: 0.16 mg/dL (ref 0.00–0.40)
Total Protein: 6.4 g/dL (ref 6.0–8.5)

## 2022-08-08 LAB — TSH: TSH: 3.5 u[IU]/mL (ref 0.450–4.500)

## 2022-09-08 DIAGNOSIS — K080 Exfoliation of teeth due to systemic causes: Secondary | ICD-10-CM | POA: Diagnosis not present

## 2022-09-21 ENCOUNTER — Other Ambulatory Visit: Payer: Self-pay | Admitting: Cardiology

## 2022-09-30 ENCOUNTER — Other Ambulatory Visit: Payer: Self-pay | Admitting: Cardiology

## 2022-10-14 ENCOUNTER — Other Ambulatory Visit: Payer: Self-pay | Admitting: Cardiology

## 2022-11-04 DIAGNOSIS — I4891 Unspecified atrial fibrillation: Secondary | ICD-10-CM | POA: Diagnosis not present

## 2022-11-04 DIAGNOSIS — G4733 Obstructive sleep apnea (adult) (pediatric): Secondary | ICD-10-CM | POA: Diagnosis not present

## 2022-11-04 DIAGNOSIS — I10 Essential (primary) hypertension: Secondary | ICD-10-CM | POA: Diagnosis not present

## 2022-11-26 DIAGNOSIS — G4733 Obstructive sleep apnea (adult) (pediatric): Secondary | ICD-10-CM | POA: Diagnosis not present

## 2023-01-05 ENCOUNTER — Other Ambulatory Visit (HOSPITAL_BASED_OUTPATIENT_CLINIC_OR_DEPARTMENT_OTHER): Payer: Self-pay

## 2023-01-05 MED ORDER — COMIRNATY 30 MCG/0.3ML IM SUSY
0.3000 mL | PREFILLED_SYRINGE | Freq: Once | INTRAMUSCULAR | 0 refills | Status: AC
  Filled 2023-01-05: qty 0.3, 1d supply, fill #0

## 2023-01-05 MED ORDER — FLUAD 0.5 ML IM SUSY
0.5000 mL | PREFILLED_SYRINGE | Freq: Once | INTRAMUSCULAR | 0 refills | Status: AC
  Filled 2023-01-05: qty 0.5, 1d supply, fill #0

## 2023-01-31 ENCOUNTER — Other Ambulatory Visit: Payer: Self-pay | Admitting: Cardiology

## 2023-01-31 NOTE — Progress Notes (Unsigned)
Cardiology Office Note:  .   Date:  01/31/2023  ID:  Ronald Hull, DOB Oct 16, 1945, MRN [id number] PCP: Renford Dills, MD  [facility] Providers Cardiologist:  Tonny Bollman, MD Electrophysiologist:  Regan Lemming, MD {  History of Present Illness: .   Ronald Hull is a 77 y.o. male w/PMHx of HTN, HLD, CAD (PCI to RCA/Cx 2019), OSA w/CPAP, AFib   Saw Dr. Elberta Fortis 08/07/22, doing well, , some fatigue towards mid-late day with reports of HRs in the 40's, Toprol dose reduced   Today's visit is scheduled as a 6 mo visit  ROS: ***  *** symptoms *** brady? *** AFib? *** amio labs *** eliquis, dose, bleeding, labs  Arrhythmia/AAD hx Afib diagnosed 2017 Amiodarone > Multaq > Back on amiodarone PVI ablation 05/20/2018 PVI ablation 09/12/19 Watchman 08/08/21 (2/2 bruising, falls preference to be off OAC)  Studies Reviewed: Marland Kitchen    EKG done today and reviewed by myself:  ***    08/08/21: TEE 1. Chicken wing appendage with no thrombus 27 mm FLX Watchman device  deployed Negative tug test. No leak by color flow Average compression  18.8%.   2. Left ventricular ejection fraction, by estimation, is 55 to 60%. The  left ventricle has normal function.   3. Right ventricular systolic function is normal. The right ventricular  size is normal.   4. Left atrial size was moderately dilated. No left atrial/left atrial  appendage thrombus was detected.   5. The mitral valve is abnormal. Mild mitral valve regurgitation.   6. The aortic valve is tricuspid. There is mild calcification of the  aortic valve. There is mild thickening of the aortic valve. Aortic valve  regurgitation is mild. Mild aortic valve stenosis.   7. PFO prior to trans septal Left to right shunt through trans septal  puncture.   09/02/19: EPS/ablation CONCLUSIONS: 1. Atrial fibrillation upon presentation.   2. Successful electrical isolation and anatomical encircling of all four pulmonary veins with  radiofrequency current.  A WACA approach was used 3. Additional left atrial ablation was performed with a standard box lesion created along the posterior wall of the left atrium 4. Atrial fibrillation successfully cardioverted to sinus rhythm. 5. No early apparent complications.   05/20/2018: EPS/ablation CONCLUSIONS: 1. Atrial fibrillation upon presentation.   2. Successful electrical isolation and anatomical encircling of all four pulmonary veins with radiofrequency current.  A WACA approach was used 3. Additional left atrial ablation was performed with a standard box lesion created along the posterior wall of the left atrium 4. Atrial fibrillation successfully cardioverted to sinus rhythm. 5. No early apparent complications.   Risk Assessment/Calculations:    Physical Exam:   VS:  There were no vitals taken for this visit.   Wt Readings from Last 3 Encounters:  08/07/22 280 lb (127 kg)  02/26/22 277 lb (125.6 kg)  02/03/22 280 lb 3.2 oz (127.1 kg)    GEN: Well nourished, well developed in no acute distress NECK: No JVD; No carotid bruits CARDIAC: ***RRR, no murmurs, rubs, gallops RESPIRATORY:  *** CTA b/l without rales, wheezing or rhonchi  ABDOMEN: Soft, non-tender, non-distended EXTREMITIES:  No edema; No deformity   PPM/ICD/ILR site: *** is stable, no thinning, fluctuation, tethering  ASSESSMENT AND PLAN: .    persistent AFib Off OAC s/p LAAO Chronic amiodarone *** burden by symptoms  CAD *** C/w Dr. Sondra Barges  HTN ***  Secondary hypercoagulable state 2/2 AFib     {Are you ordering a CV  Procedure (e.g. stress test, cath, DCCV, TEE, etc)?   Press F2        :[id number]}     Dispo: ***  Signed, Sheilah Pigeon, PA-C

## 2023-02-03 ENCOUNTER — Ambulatory Visit: Payer: Medicare Other | Attending: Physician Assistant | Admitting: Physician Assistant

## 2023-02-03 ENCOUNTER — Encounter: Payer: Self-pay | Admitting: Physician Assistant

## 2023-02-03 VITALS — BP 138/72 | HR 55 | Ht 71.0 in | Wt 280.0 lb

## 2023-02-03 DIAGNOSIS — I2510 Atherosclerotic heart disease of native coronary artery without angina pectoris: Secondary | ICD-10-CM | POA: Diagnosis not present

## 2023-02-03 DIAGNOSIS — D6869 Other thrombophilia: Secondary | ICD-10-CM

## 2023-02-03 DIAGNOSIS — I10 Essential (primary) hypertension: Secondary | ICD-10-CM

## 2023-02-03 DIAGNOSIS — I4819 Other persistent atrial fibrillation: Secondary | ICD-10-CM | POA: Diagnosis not present

## 2023-02-03 MED ORDER — METOPROLOL SUCCINATE ER 25 MG PO TB24: 25.0000 mg | ORAL_TABLET | Freq: Every day | ORAL | 3 refills | Status: DC

## 2023-02-03 MED ORDER — AMIODARONE HCL 100 MG PO TABS: 100.0000 mg | ORAL_TABLET | ORAL | 3 refills | Status: AC

## 2023-02-03 NOTE — Patient Instructions (Signed)
Medication Instructions:   START TAKING :  AMIODARONE 100 MG EVERY OTHER DAY   *If you need a refill on your cardiac medications before your next appointment, please call your pharmacy*   Lab Work: CMET CBC AND TSH TODAY   If you have labs (blood work) drawn today and your tests are completely normal, you will receive your results only by: MyChart Message (if you have MyChart) OR A paper copy in the mail If you have any lab test that is abnormal or we need to change your treatment, we will call you to review the results.   Testing/Procedures:  NONE ORDERED  TODAY     Follow-Up: At [facility], you and your health needs are our priority.  As part of our continuing mission to provide you with exceptional heart care, we have created designated Provider Care Teams.  These Care Teams include your primary Cardiologist (physician) and Advanced Practice Providers (APPs -  Physician Assistants and Nurse Practitioners) who all work together to provide you with the care you need, when you need it.  We recommend signing up for the patient portal called "MyChart".  Sign up information is provided on this After Visit Summary.  MyChart is used to connect with patients for Virtual Visits (Telemedicine).  Patients are able to view lab/test results, encounter notes, upcoming appointments, etc.  Non-urgent messages can be sent to your provider as well.   To learn more about what you can do with MyChart, go to [email or web address].    Your next appointment:   6 month(s)  Provider:   You may see Will [name], MD or one of the following Advanced Practice Providers on your designated Care Team:   Francis Dowse, [place]

## 2023-02-04 LAB — COMPREHENSIVE METABOLIC PANEL
ALT: 14 [IU]/L (ref 0–44)
AST: 18 [IU]/L (ref 0–40)
Albumin: 4 g/dL (ref 3.8–4.8)
Alkaline Phosphatase: 91 [IU]/L (ref 44–121)
BUN/Creatinine Ratio: 19 (ref 10–24)
BUN: 22 mg/dL (ref 8–27)
Bilirubin Total: 0.4 mg/dL (ref 0.0–1.2)
CO2: 20 mmol/L (ref 20–29)
Calcium: 8.9 mg/dL (ref 8.6–10.2)
Chloride: 100 mmol/L (ref 96–106)
Creatinine, Ser: 1.16 mg/dL (ref 0.76–1.27)
Globulin, Total: 2.8 g/dL (ref 1.5–4.5)
Glucose: 87 mg/dL (ref 70–99)
Potassium: 4.9 mmol/L (ref 3.5–5.2)
Sodium: 138 mmol/L (ref 134–144)
Total Protein: 6.8 g/dL (ref 6.0–8.5)
eGFR: 65 mL/min/{1.73_m2} (ref >59)

## 2023-02-04 LAB — CBC
Hematocrit: 44.7 % (ref 37.5–51.0)
Hemoglobin: 14.2 g/dL (ref 13.0–17.7)
MCH: 31.7 pg (ref 26.6–33.0)
MCHC: 31.8 g/dL (ref 31.5–35.7)
MCV: 100 fL — ABNORMAL HIGH (ref 79–97)
Platelets: 194 10*3/uL (ref 150–450)
RBC: 4.48 x10E6/uL (ref 4.14–5.80)
RDW: 13.5 % (ref 11.6–15.4)
WBC: 7.3 10*3/uL (ref 3.4–10.8)

## 2023-02-04 LAB — TSH: TSH: 4.07 u[IU]/mL (ref 0.450–4.500)

## 2023-03-02 ENCOUNTER — Encounter: Payer: Self-pay | Admitting: Neurology

## 2023-03-02 ENCOUNTER — Ambulatory Visit: Payer: Medicare Other | Admitting: Neurology

## 2023-03-02 VITALS — BP 150/77 | HR 63 | Ht 71.0 in | Wt 282.4 lb

## 2023-03-02 DIAGNOSIS — R29898 Other symptoms and signs involving the musculoskeletal system: Secondary | ICD-10-CM | POA: Diagnosis not present

## 2023-03-02 DIAGNOSIS — G629 Polyneuropathy, unspecified: Secondary | ICD-10-CM | POA: Diagnosis not present

## 2023-03-02 DIAGNOSIS — R2681 Unsteadiness on feet: Secondary | ICD-10-CM | POA: Diagnosis not present

## 2023-03-02 NOTE — Progress Notes (Signed)
Follow-up Visit   Date: 03/02/23   Ronald Hull MRN: [id number] DOB: [birth year]   Interim History: Ronald Hull is a 77 y.o. right-handed Caucasian male with CAD, atrial fibrllation, and hypertension returning to the clinic for follow-up of neuropathy affecting the feet and bilateral hand paresthesias.  The patient was accompanied to the clinic by self.  History of present illness: Starting around 2021, he began having sensation that his feet are wrapped in gauze or walking on sand.  Symptoms became more bothersome since the summer of 2022.  He denies burning or tingling.  It involves the soles and balls of the feet.  Symptoms are noticeable when he is resting or in bed.  Balance is getting worse for sometime.  He has fallen several times this year, one with injury.  He does not use a cane and has not done PT.    He lives at home with his wife in a 2-level home.  He works as a Estate manager/land agent.  He drinks 2 glasses wine per day.  He was previously drinking 4 glasses wine nightly prior to two years ago.    Vitamin B12 level was 257. He is not taking multivitamin or supplements.  NCS/EMG of the legs performed by Dr. Neale Burly  shows chronic sensorimotor axonal polyneuropathy.   UPDATE 06/12/2021:  He is here for follow-up.  There has been no change in neuropathy, which remains restricted to the feet.  He has reduced sensation, no pain.  Balance is fair.  He missed PT appointment due to travel interruption secondary to weather, and when he tried to call back there was no answer. He is still interested in doing PT.  He walks unassisted, no falls.    Lately, he has noticed being more forgetful, especially with names.  He works as a Engineer, water and has not had any problems keeping up with work duties.  He manages finances, medications, and denies problems with navigation.    UPDATE 02/26/2022:  He is here for follow-up.  No change in neuropathy in the feet. His balance has  improved after starting daily walking regimen.  He walks around his home and tries to walk 7 miles/day.  He walks unassisted.   He has noticed new numbness in the hands upon wakening, which occurs several times per week.  No hand weakness.   He continues to feel forgetful at times.  Formal neuropsychological testing from October 2023 was normal.   UPDATE 03/02/2023: He is here for follow-up visit.  He is more aware of numbness in the hands which happens at night time.  He is dropping items more frequently, predominately in the right hand.  He is usually able to shake the hands to get relief. He also complains of leg stiffness and bilateral buttocks and hip pain which is worse in the morning.  It typically takes him 15 minutes for him to get going.  He also reports having difficulty from stand up from a chair and tends to rely on his arms to push off, then his legs.   He has near falls.  He walks unassisted and continues to walk 2 miles per day.    Medications:  Current Outpatient Medications on File Prior to Visit  Medication Sig Dispense Refill   acetaminophen (TYLENOL) 325 MG tablet Take 325 mg by mouth every 6 (six) hours as needed (for pain.).      amiodarone (PACERONE) 100 MG tablet Take 1 tablet (100 mg  total) by mouth every other day. 45 tablet 3   aspirin EC 81 MG tablet Take 81 mg by mouth daily. Swallow whole.     atorvastatin (LIPITOR) 40 MG tablet TAKE 1 TABLET(40 MG) BY MOUTH DAILY 90 tablet 3   Cyanocobalamin (VITAMIN B 12 PO) Take by mouth.     furosemide (LASIX) 20 MG tablet Take 1-2 tablets (20-40 mg total) by mouth daily as needed for fluid. 60 tablet 6   losartan (COZAAR) 100 MG tablet TAKE 1 TABLET(100 MG) BY MOUTH DAILY 90 tablet 3   metoprolol succinate (TOPROL XL) 25 MG 24 hr tablet Take 1 tablet (25 mg total) by mouth daily. 90 tablet 3   nitroGLYCERIN (NITROSTAT) 0.4 MG SL tablet PLACE 1 TABLET UNDER THE TONGUE AS NEEDED FOR CHEST PAIN EVERY 5 MINUTES AS NEEDED 25 tablet 9    zaleplon (SONATA) 5 MG capsule Take 5 mg by mouth at bedtime as needed for sleep.     No current facility-administered medications on file prior to visit.    Allergies:  Allergies  Allergen Reactions   Lisinopril Cough    Vital Signs:  BP (!) 150/77   Pulse 63   Ht 5\' 11"  (1.803 m)   Wt 282 lb 6.4 oz (128.1 kg)   SpO2 96%   BMI 39.39 kg/m   Neurological Exam: MENTAL STATUS including orientation to time, place, person, recent and remote memory, attention span and concentration, language, and fund of knowledge is normal.  Speech is not dysarthric.    06/12/2021    9:00 AM  Montreal Cognitive Assessment   Visuospatial/ Executive (0/5) 5  Naming (0/3) 3  Attention: Read list of digits (0/2) 2  Attention: Read list of letters (0/1) 1  Attention: Serial 7 subtraction starting at 100 (0/3) 3  Language: Repeat phrase (0/2) 2  Language : Fluency (0/1) 1  Abstraction (0/2) 2  Delayed Recall (0/5) 5  Orientation (0/6) 6  Total 30  Adjusted Score (based on education) 30   CRANIAL NERVES:   Normal conjugate, extra-ocular eye movements in all directions of gaze.  No ptosis.    MOTOR:  Motor strength is 5/5 in all extremities, including distally.  No atrophy, fasciculations or abnormal movements.  No pronator drift.  Tone is normal.    MSRs:  Reflexes are 2+/4 throughout, except absent at the ankles.  SENSORY:  Vibration is reduced at the ankle.  Vibration normal at the knees.   COORDINATION/GAIT:   Gait wide-based and stable, unassisted.  Unsteady with tandem gait testing.   Data: Labs 02/08/2021:  copper 122, thiamine 126, folate 4.8, SPEP with IFE no M protein  NCS/EMG of the legs 12/19/2020:  Chronic sensorimotor axonal and demyelinating neuropathy.  Neuropsychological testing 02/17/22:  Normal    IMPRESSION/PLAN: Peripheral neuropathy affecting the feet.  Risk factors:  history of alcohol use, low B12, age.  Worsening balance - Start PT for balance  training  Bilateral leg weakness (subjective).  He may have lumbosacral spondylosis causing radiculopathy.  Absence of hyperreflexia makes spinal stenosis less likely.   - Start PT for leg strengthening - He may seek the opinion of orthopaedics   Bilateral hand paresthesias, likely entrapment neuropathy - NCS/EMG declined, may reconsider next year if symptoms get worse  Return to clinic in 1 year  Thank you for allowing me to participate in patient's care.  If I can answer any additional questions, I would be pleased to do so.    Sincerely,  Jaclyne Haverstick K. Allena Katz, DO

## 2023-03-02 NOTE — Patient Instructions (Signed)
We will refer you for out-patient physical therapy for balance and leg strengthening.  I will see you back in 1 year.

## 2023-03-12 ENCOUNTER — Ambulatory Visit (HOSPITAL_COMMUNITY): Payer: Medicare Other | Admitting: Physician Assistant

## 2023-03-13 ENCOUNTER — Other Ambulatory Visit: Payer: Self-pay

## 2023-03-13 ENCOUNTER — Ambulatory Visit: Payer: Medicare Other | Attending: Neurology

## 2023-03-13 DIAGNOSIS — R2681 Unsteadiness on feet: Secondary | ICD-10-CM | POA: Insufficient documentation

## 2023-03-13 DIAGNOSIS — M6281 Muscle weakness (generalized): Secondary | ICD-10-CM | POA: Diagnosis not present

## 2023-03-13 DIAGNOSIS — G629 Polyneuropathy, unspecified: Secondary | ICD-10-CM | POA: Insufficient documentation

## 2023-03-13 DIAGNOSIS — R29898 Other symptoms and signs involving the musculoskeletal system: Secondary | ICD-10-CM | POA: Diagnosis not present

## 2023-03-13 NOTE — Therapy (Signed)
OUTPATIENT PHYSICAL THERAPY NEURO EVALUATION   Patient Name: Ronald Hull MRN: [id number] DOB:1945-12-27, 77 y.o., male Today's Date: 03/13/2023   PCP: Renford Dills, MD REFERRING PROVIDER: Glendale Chard, DO  END OF SESSION:  PT End of Session - 03/13/23 0851     Visit Number 1    Number of Visits 7    Date for PT Re-Evaluation 04/24/23    Authorization Type BCBS Medicare    PT Start Time [phone]    PT Stop Time 0930    PT Time Calculation (min) 40 min             Past Medical History:  Diagnosis Date   Arthritis    Bronchitis 05/23/2018   CAD (coronary artery disease) 02/24/2018   LHC 10/19: pLAD 50, mLAD 50 (dFFR=0.89), oLCx 95, pRCA 30, mRCA 80 >> PCI: DES to LCx; DES to RCA // [id number] Trial participant (will DC Plavix after 30 d and remain on ASA + Apixaban)   Chest pain secondary to ablation, with expected mild elevation in troponin 05/22/2018   Complication of anesthesia    BP dropped post colonoscopy   Coronary arteriosclerosis 02/24/2018   Essential hypertension 03/12/2018   Fracture of proximal phalanx of finger 10/24/2019   Hypercholesteremia    Hyperlipidemia 03/12/2018   Lung nodules 02/25/2019   Chest CT 01/2019: LUL nodule unchanged (benign); new LUL nodules 6 mm, aortic atherosclerosis, emphysema, partially calcified pleural plaques suggesting prior empyema or hemothorax   Obstructive sleep apnea    uses CPAP nightly   Pain of left hand 11/22/2019   Paroxysmal atrial fibrillation    persistent    Presence of Watchman left atrial appendage closure device 08/08/2021   Watchman 27mm with Dr. Lalla Brothers   Secondary hypercoagulable state 04/05/2019   Wears glasses    Past Surgical History:  Procedure Laterality Date   ATRIAL FIBRILLATION ABLATION N/A 05/20/2018   Procedure: ATRIAL FIBRILLATION ABLATION;  Surgeon: Regan Lemming, MD;  Location: [facility] INVASIVE CV LAB;  Service: Cardiovascular;  Laterality: N/A;   ATRIAL FIBRILLATION ABLATION N/A  09/02/2019   Procedure: ATRIAL FIBRILLATION ABLATION;  Surgeon: Regan Lemming, MD;  Location: [facility];  Service: Cardiovascular;  Laterality: N/A;   CARDIOVERSION N/A 11/01/2015   Procedure: CARDIOVERSION;  Surgeon: [name], MD;  Location: [facility] ENDOSCOPY;  Service: Cardiovascular;  Laterality: N/A;   CARDIOVERSION N/A 11/23/2015   Procedure: CARDIOVERSION;  Surgeon: Will [name], MD;  Location: [facility] ENDOSCOPY;  Service: Cardiovascular;  Laterality: N/A;   CARDIOVERSION N/A 10/12/2017   Procedure: CARDIOVERSION;  Surgeon: [name], MD;  Location: [facility] ENDOSCOPY;  Service: Cardiovascular;  Laterality: N/A;   CARDIOVERSION N/A 06/30/2018   Procedure: CARDIOVERSION;  Surgeon: Jake Bathe, MD;  Location: [facility] ENDOSCOPY;  Service: Cardiovascular;  Laterality: N/A;   CARDIOVERSION N/A 09/22/2018   Procedure: CARDIOVERSION;  Surgeon: Quintella Reichert, MD;  Location: [facility] ENDOSCOPY;  Service: Cardiovascular;  Laterality: N/A;   CARDIOVERSION N/A 07/15/2019   Procedure: CARDIOVERSION;  Surgeon: Jodelle Red, MD;  Location: [facility] ENDOSCOPY;  Service: Cardiovascular;  Laterality: N/A;   CARDIOVERSION N/A 12/07/2019   Procedure: CARDIOVERSION;  Surgeon: Chilton Si, MD;  Location: [facility] ENDOSCOPY;  Service: Cardiovascular;  Laterality: N/A;   CARDIOVERSION N/A 03/19/2020   Procedure: CARDIOVERSION;  Surgeon: Quintella Reichert, MD;  Location: [facility] ENDOSCOPY;  Service: Cardiovascular;  Laterality: N/A;   COLONOSCOPY     CORONARY PRESSURE/FFR STUDY N/A 02/24/2018   Procedure: INTRAVASCULAR PRESSURE WIRE/FFR STUDY;  Surgeon: Tonny Bollman, MD;  Location: [facility] INVASIVE CV LAB;  Service: Cardiovascular;  Laterality: N/A;   CORONARY STENT INTERVENTION N/A 02/24/2018   Procedure: CORONARY STENT INTERVENTION;  Surgeon: Tonny Bollman, MD;  Location: [facility] INVASIVE CV LAB;  Service: Cardiovascular;  Laterality: N/A;   I & D EXTREMITY Left 10/19/2019   Procedure: IRRIGATION AND DEBRIDEMENT  LEFT SMALL PROXIMAL PHALANX;  Surgeon: Bradly Bienenstock, MD;  Location: [facility];  Service: Orthopedics;  Laterality: Left;   KNEE ARTHROSCOPY  2000   LEFT ATRIAL APPENDAGE OCCLUSION N/A 08/08/2021   Procedure: LEFT ATRIAL APPENDAGE OCCLUSION;  Surgeon: Lanier Prude, MD;  Location: [facility];  Service: Cardiovascular;  Laterality: N/A;   LEFT HEART CATH AND CORONARY ANGIOGRAPHY N/A 02/24/2018   Procedure: LEFT HEART CATH AND CORONARY ANGIOGRAPHY;  Surgeon: Tonny Bollman, MD;  Location: [facility] INVASIVE CV LAB;  Service: Cardiovascular;  Laterality: N/A;   OPEN REDUCTION INTERNAL FIXATION (ORIF) PROXIMAL PHALANX Left 10/19/2019   Procedure: OPEN REDUCTION INTERNAL FIXATION (ORIF) PROXIMAL PHALANX WITH K-WIRES LEFT SMALL;  Surgeon: Bradly Bienenstock, MD;  Location: [facility];  Service: Orthopedics;  Laterality: Left;   SHOULDER ARTHROSCOPY WITH ROTATOR CUFF REPAIR AND SUBACROMIAL DECOMPRESSION Right 07/15/2012   Procedure: RIGHT ARTHROSCOPY SHOULDER DECOMPRESSION  SUBACROMIAL PARTIAL ACROMIOPLASTY WITH CORACOACROMIAL RELEASE,  DISTAL CLAVICULECTOMY, resect biceps debride labrium WITH ROTATOR CUFF REPAIR;  Surgeon: [name], MD;  Location: [facility];  Service: Orthopedics;  Laterality: Right;   TEE WITHOUT CARDIOVERSION N/A 08/08/2021   Procedure: TRANSESOPHAGEAL ECHOCARDIOGRAM (TEE);  Surgeon: Lanier Prude, MD;  Location: [facility] INVASIVE CV LAB;  Service: Cardiovascular;  Laterality: N/A;   Patient Active Problem List   Diagnosis Date Noted   Obstructive sleep apnea 02/17/2022   Presence of Watchman left atrial appendage closure device 08/08/2021   Pain of left hand 11/22/2019   Secondary hypercoagulable state 04/05/2019   Lung nodules 02/25/2019   Paroxysmal atrial fibrillation    Hyperlipidemia 03/12/2018   Essential hypertension 03/12/2018   CAD (coronary artery disease) 02/24/2018   Coronary arteriosclerosis 02/24/2018    ONSET  DATE: "couple of years"  REFERRING DIAG: R26.81 (ICD-10-CM) - Unsteady gait G62.9 (ICD-10-CM) - Neuropathy R29.898 (ICD-10-CM) - Leg weakness, bilateral  THERAPY DIAG:  No diagnosis found.  Rationale for Evaluation and Treatment: Rehabilitation  SUBJECTIVE:                                                                                                                                                                                             SUBJECTIVE STATEMENT: Balance problems with neuropathy in the legs and  notes legs feel like bricks in AM.  NCS confirms neuropathy in BLE.  Notes lack of power for getting up from chair and notes hands are affected as well. Notes in the AM discomfort tends to run from buttocks down but relieved by walking.  Right knee is bothersome from OA Pt accompanied by: self  PERTINENT HISTORY: CAD, atrial fibrllation, and hypertension returning to the clinic for follow-up of neuropathy affecting the feet and bilateral hand paresthesias.he began having sensation that his feet are wrapped in gauze or walking on sand. Symptoms became more bothersome since the summer of 2022. He denies burning or tingling. It involves the soles and balls of the feet. Symptoms are noticeable when he is resting or in bed. Balance is getting worse for sometime. He has fallen several times this year, one with injury. He does not use a cane and has not done PT.   PAIN:  Are you having pain? No  PRECAUTIONS: None  RED FLAGS: None   WEIGHT BEARING RESTRICTIONS: No  FALLS: Has patient fallen in last 6 months?  Notes near falls over past 6 months  LIVING ENVIRONMENT: Lives with: lives with their spouse Lives in: House/apartment Stairs:  flight of stairs, requires step-to pattern if no rails, reciprocal with rails Has following equipment at home: None  PLOF: Independent He lives at home with his wife in a 2-level home. He works as a Estate manager/land agent. He walks unassisted and  continues to walk 2 miles per day (over course of the day, not all at one)  PATIENT GOALS: improve balance and power in LE  OBJECTIVE:  Note: Objective measures were completed at Evaluation unless otherwise noted.  DIAGNOSTIC FINDINGS: NCS/EMG of the legs performed by Dr. Neale Burly shows chronic sensorimotor axonal polyneuropathy.  COGNITION: Overall cognitive status: Within functional limits for tasks assessed   SENSATION: Not tested, known hx of BLE neuropathy  COORDINATION: WNL  EDEMA:  None noted  MUSCLE TONE: WNL     POSTURE: No Significant postural limitations  LOWER EXTREMITY ROM:     Active  Right Eval Left Eval  Hip flexion 100 100  Hip extension    Hip abduction    Hip adduction    Hip internal rotation    Hip external rotation    Knee flexion 95-100 95-100  Knee extension 0 0  Ankle dorsiflexion 15 15  Ankle plantarflexion    Ankle inversion    Ankle eversion     (Blank rows = not tested)  LOWER EXTREMITY MMT:   5/5 gross strength to manual muscle test  BED MOBILITY:  NT  TRANSFERS: Assistive device utilized: None  Sit to stand: Modified independence Stand to sit: Modified independence Chair to chair: Modified independence Floor:  NT      STAIRS: NT  GAIT: Gait pattern: wide BOS Distance walked:  Assistive device utilized: None Level of assistance: Complete Independence Comments:   FUNCTIONAL TESTS:  5 times sit to stand: 1 rep w/ lack of eccentric control Timed up and go (TUG): 12  sec. No AD Berg Balance Scale: 49/56  M-CTSIB  Condition 1: Firm Surface, EO 30 Sec, Mild Sway  Condition 2: Firm Surface, EC 30 Sec, Mild and Moderate Sway  Condition 3: Foam Surface, EO 30 Sec, Mild Sway  Condition 4: Foam Surface, EC 30 Sec, Moderate Sway      TODAY'S TREATMENT:  DATE: 03/13/23    PATIENT  EDUCATION: Education details: assessment details, rationale of intervention, HEP initiation Person educated: Patient Education method: Explanation Education comprehension: verbalized understanding  HOME EXERCISE PROGRAM: Access Code: [email or web address] URL: [email or web address] Date: 03/13/2023 Prepared by: Shary Decamp  Exercises - Sit to Stand with Arms Crossed  - 1 x daily - 7 x weekly - 5 sets - 5 reps - Corner Balance Feet Together With Eyes Closed  - 1 x daily - 7 x weekly - 3 sets - 30 sec hold - Corner Balance Feet Together: Eyes Open With Head Turns  - 1 x daily - 7 x weekly - 3 sets - 3 reps - Corner Balance Feet Together: Eyes Closed With Head Turns  - 1 x daily - 7 x weekly - 3 sets - 3 reps - Semi-Tandem Corner Balance With Eyes Open  - 1 x daily - 7 x weekly - 1-3 sets - 15 sec hold  GOALS: Goals reviewed with patient? Yes  SHORT TERM GOALS: Target date: 04/03/2023    Patient will be independent in HEP to improve functional outcomes Baseline: Goal status: INITIAL    LONG TERM GOALS: Target date: 04/24/2023    Demo improved BLE strength as evidenced by ability to perform 5xSTS test in 25 sec Baseline: 1 rep, requires use of hands, no eccentric control in last few degrees of lowering Goal status: INITIAL  2.  Demo improved postural stability per mild sway x 30 sec condition 4 M-CTSIB to improve safety with ADL Baseline: moderate x 30 sec Goal status: INITIAL  3.  Demo improved balance and stability with single limb support as evidenced by increase score of 52/56 Berg Balance Test to reduce risk for falls Baseline: 49/56 Goal status: INITIAL  4.  Report improved functional performance as evidenced by ability to arise from couch without struggle Baseline: unable Goal status: INITIAL    ASSESSMENT:  CLINICAL IMPRESSION: Patient is a 77 y.o. male who was seen today for physical therapy evaluation and treatment for balance and strength deficits. Fall  risk assessments of TUG test and Berg Balance Test reveal low risk for falls with inability to perform 5xSTS test due to LE weakness and lack of eccentric control when lowering likely due to hx of right knee dysfunction.  Most notably exhibits difficulty with narrow BOS, single limb support, and difficulty with body mechanics of squatting to pick up things from ground.  Patient would benefit from PT services to address deficits and limitations to improve functional mobility and activity tolerance.  Additionally, he would benefit from aquatic intervention. This approach allows for a whole-body focus, with special attention on activating and relearning proper biomechanics in injured areas. Additionally, water as a modality can both strengthen and facilitate the whole body; water's unique properties can reduce impact of exercise on joints, increase pain-free movement, improve microcirculation, and help increase muscle tone through natural resistance to improve this patient's functional mobility  OBJECTIVE IMPAIRMENTS: Abnormal gait, decreased activity tolerance, decreased balance, decreased ROM, decreased strength, and improper body mechanics.   ACTIVITY LIMITATIONS: carrying, bending, squatting, stairs, transfers, and locomotion level  PARTICIPATION LIMITATIONS: cleaning, laundry, community activity, yard work, and exercise routine  PERSONAL FACTORS: Age, Time since onset of injury/illness/exacerbation, and 1-2 comorbidities: PMH  are also affecting patient's functional outcome.   REHAB POTENTIAL: Good  CLINICAL DECISION MAKING: Stable/uncomplicated  EVALUATION COMPLEXITY: Low  PLAN:  PT FREQUENCY: 1x/week  PT DURATION: 6 weeks  PLANNED INTERVENTIONS: 97110-Therapeutic exercises, 97530- Therapeutic activity, 97112-  Neuromuscular re-education, [phone]- Self Care, [id number]- Manual therapy, [phone]- Gait training, [phone]- Canalith repositioning, [phone]- Aquatic Therapy, Patient/Family education, Balance  training, Stair training, Dry Needling, Joint mobilization, Spinal mobilization, DME instructions, Cryotherapy, and Moist heat  PLAN FOR NEXT SESSION: review HEP, add foam cushion for HEP as he has one at home, knee ROM activities for flexion   11:22 AM, 03/13/23 M. Shary Decamp, PT, DPT Physical Therapist- [place] Office Number: [phone]

## 2023-03-20 ENCOUNTER — Ambulatory Visit: Payer: Medicare Other

## 2023-03-20 DIAGNOSIS — M6281 Muscle weakness (generalized): Secondary | ICD-10-CM | POA: Diagnosis not present

## 2023-03-20 DIAGNOSIS — R2681 Unsteadiness on feet: Secondary | ICD-10-CM

## 2023-03-20 DIAGNOSIS — R29898 Other symptoms and signs involving the musculoskeletal system: Secondary | ICD-10-CM

## 2023-03-20 DIAGNOSIS — G629 Polyneuropathy, unspecified: Secondary | ICD-10-CM | POA: Diagnosis not present

## 2023-03-20 NOTE — Therapy (Signed)
OUTPATIENT PHYSICAL THERAPY NEURO TREATMENT   Patient Name: Ronald Hull MRN: [id number] DOB:November 12, 1945, 77 y.o., male Today's Date: 03/20/2023   PCP: Renford Dills, MD REFERRING PROVIDER: Glendale Chard, DO  END OF SESSION:  PT End of Session - 03/20/23 0933     Visit Number 2    Number of Visits 7    Date for PT Re-Evaluation 04/24/23    Authorization Type BCBS Medicare    PT Start Time 0930    PT Stop Time 1015    PT Time Calculation (min) 45 min             Past Medical History:  Diagnosis Date   Arthritis    Bronchitis 05/23/2018   CAD (coronary artery disease) 02/24/2018   LHC 10/19: pLAD 50, mLAD 50 (dFFR=0.89), oLCx 95, pRCA 30, mRCA 80 >> PCI: DES to LCx; DES to RCA // [id number] Trial participant (will DC Plavix after 30 d and remain on ASA + Apixaban)   Chest pain secondary to ablation, with expected mild elevation in troponin 05/22/2018   Complication of anesthesia    BP dropped post colonoscopy   Coronary arteriosclerosis 02/24/2018   Essential hypertension 03/12/2018   Fracture of proximal phalanx of finger 10/24/2019   Hypercholesteremia    Hyperlipidemia 03/12/2018   Lung nodules 02/25/2019   Chest CT 01/2019: LUL nodule unchanged (benign); new LUL nodules 6 mm, aortic atherosclerosis, emphysema, partially calcified pleural plaques suggesting prior empyema or hemothorax   Obstructive sleep apnea    uses CPAP nightly   Pain of left hand 11/22/2019   Paroxysmal atrial fibrillation    persistent    Presence of Watchman left atrial appendage closure device 08/08/2021   Watchman 27mm with Dr. Lalla Brothers   Secondary hypercoagulable state 04/05/2019   Wears glasses    Past Surgical History:  Procedure Laterality Date   ATRIAL FIBRILLATION ABLATION N/A 05/20/2018   Procedure: ATRIAL FIBRILLATION ABLATION;  Surgeon: Regan Lemming, MD;  Location: [facility];  Service: Cardiovascular;  Laterality: N/A;   ATRIAL FIBRILLATION ABLATION N/A  09/02/2019   Procedure: ATRIAL FIBRILLATION ABLATION;  Surgeon: Regan Lemming, MD;  Location: [facility];  Service: Cardiovascular;  Laterality: N/A;   CARDIOVERSION N/A 11/01/2015   Procedure: CARDIOVERSION;  Surgeon: [name], MD;  Location: [facility] ENDOSCOPY;  Service: Cardiovascular;  Laterality: N/A;   CARDIOVERSION N/A 11/23/2015   Procedure: CARDIOVERSION;  Surgeon: Will [name], MD;  Location: [facility] ENDOSCOPY;  Service: Cardiovascular;  Laterality: N/A;   CARDIOVERSION N/A 10/12/2017   Procedure: CARDIOVERSION;  Surgeon: [name], MD;  Location: [facility] ENDOSCOPY;  Service: Cardiovascular;  Laterality: N/A;   CARDIOVERSION N/A 06/30/2018   Procedure: CARDIOVERSION;  Surgeon: Jake Bathe, MD;  Location: [facility] ENDOSCOPY;  Service: Cardiovascular;  Laterality: N/A;   CARDIOVERSION N/A 09/22/2018   Procedure: CARDIOVERSION;  Surgeon: Quintella Reichert, MD;  Location: [facility] ENDOSCOPY;  Service: Cardiovascular;  Laterality: N/A;   CARDIOVERSION N/A 07/15/2019   Procedure: CARDIOVERSION;  Surgeon: Jodelle Red, MD;  Location: [facility] ENDOSCOPY;  Service: Cardiovascular;  Laterality: N/A;   CARDIOVERSION N/A 12/07/2019   Procedure: CARDIOVERSION;  Surgeon: Chilton Si, MD;  Location: [facility] ENDOSCOPY;  Service: Cardiovascular;  Laterality: N/A;   CARDIOVERSION N/A 03/19/2020   Procedure: CARDIOVERSION;  Surgeon: Quintella Reichert, MD;  Location: [facility] ENDOSCOPY;  Service: Cardiovascular;  Laterality: N/A;   COLONOSCOPY     CORONARY PRESSURE/FFR STUDY N/A 02/24/2018   Procedure: INTRAVASCULAR PRESSURE WIRE/FFR STUDY;  Surgeon: Tonny Bollman, MD;  Location: [facility] INVASIVE CV LAB;  Service: Cardiovascular;  Laterality: N/A;   CORONARY STENT INTERVENTION N/A 02/24/2018   Procedure: CORONARY STENT INTERVENTION;  Surgeon: Tonny Bollman, MD;  Location: [facility] INVASIVE CV LAB;  Service: Cardiovascular;  Laterality: N/A;   I & D EXTREMITY Left 10/19/2019   Procedure: IRRIGATION AND DEBRIDEMENT  LEFT SMALL PROXIMAL PHALANX;  Surgeon: Bradly Bienenstock, MD;  Location: [facility];  Service: Orthopedics;  Laterality: Left;   KNEE ARTHROSCOPY  2000   LEFT ATRIAL APPENDAGE OCCLUSION N/A 08/08/2021   Procedure: LEFT ATRIAL APPENDAGE OCCLUSION;  Surgeon: Lanier Prude, MD;  Location: [facility];  Service: Cardiovascular;  Laterality: N/A;   LEFT HEART CATH AND CORONARY ANGIOGRAPHY N/A 02/24/2018   Procedure: LEFT HEART CATH AND CORONARY ANGIOGRAPHY;  Surgeon: Tonny Bollman, MD;  Location: [facility] INVASIVE CV LAB;  Service: Cardiovascular;  Laterality: N/A;   OPEN REDUCTION INTERNAL FIXATION (ORIF) PROXIMAL PHALANX Left 10/19/2019   Procedure: OPEN REDUCTION INTERNAL FIXATION (ORIF) PROXIMAL PHALANX WITH K-WIRES LEFT SMALL;  Surgeon: Bradly Bienenstock, MD;  Location: [facility];  Service: Orthopedics;  Laterality: Left;   SHOULDER ARTHROSCOPY WITH ROTATOR CUFF REPAIR AND SUBACROMIAL DECOMPRESSION Right 07/15/2012   Procedure: RIGHT ARTHROSCOPY SHOULDER DECOMPRESSION  SUBACROMIAL PARTIAL ACROMIOPLASTY WITH CORACOACROMIAL RELEASE,  DISTAL CLAVICULECTOMY, resect biceps debride labrium WITH ROTATOR CUFF REPAIR;  Surgeon: [name], MD;  Location: [facility];  Service: Orthopedics;  Laterality: Right;   TEE WITHOUT CARDIOVERSION N/A 08/08/2021   Procedure: TRANSESOPHAGEAL ECHOCARDIOGRAM (TEE);  Surgeon: Lanier Prude, MD;  Location: [facility] INVASIVE CV LAB;  Service: Cardiovascular;  Laterality: N/A;   Patient Active Problem List   Diagnosis Date Noted   Obstructive sleep apnea 02/17/2022   Presence of Watchman left atrial appendage closure device 08/08/2021   Pain of left hand 11/22/2019   Secondary hypercoagulable state 04/05/2019   Lung nodules 02/25/2019   Paroxysmal atrial fibrillation    Hyperlipidemia 03/12/2018   Essential hypertension 03/12/2018   CAD (coronary artery disease) 02/24/2018   Coronary arteriosclerosis 02/24/2018    ONSET  DATE: "couple of years"  REFERRING DIAG: R26.81 (ICD-10-CM) - Unsteady gait G62.9 (ICD-10-CM) - Neuropathy R29.898 (ICD-10-CM) - Leg weakness, bilateral  THERAPY DIAG:  Unsteadiness on feet  Muscle weakness (generalized)  Other symptoms and signs involving the musculoskeletal system  Rationale for Evaluation and Treatment: Rehabilitation  SUBJECTIVE:  SUBJECTIVE STATEMENT: Doing ok, nothing new to report.  Pt accompanied by: self  PERTINENT HISTORY: CAD, atrial fibrllation, and hypertension returning to the clinic for follow-up of neuropathy affecting the feet and bilateral hand paresthesias.he began having sensation that his feet are wrapped in gauze or walking on sand. Symptoms became more bothersome since the summer of 2022. He denies burning or tingling. It involves the soles and balls of the feet. Symptoms are noticeable when he is resting or in bed. Balance is getting worse for sometime. He has fallen several times this year, one with injury. He does not use a cane and has not done PT.   PAIN:  Are you having pain? No  PRECAUTIONS: None  RED FLAGS: None   WEIGHT BEARING RESTRICTIONS: No  FALLS: Has patient fallen in last 6 months?  Notes near falls over past 6 months  LIVING ENVIRONMENT: Lives with: lives with their spouse Lives in: House/apartment Stairs:  flight of stairs, requires step-to pattern if no rails, reciprocal with rails Has following equipment at home: None  PLOF: Independent He lives at home with his wife in a 2-level home. He works as a Estate manager/land agent. He walks unassisted and continues to walk 2 miles per day (over course of the day, not all at one)  PATIENT GOALS: improve balance and power in LE  OBJECTIVE:   TODAY'S TREATMENT: 03/20/23 Activity Comments   HEP review 1) corner balance activities, repeated on foam 2) sit-stand 2x5 w/ 5# dumbell front rack position   Review of clinical practice guidelines for knee OA mgmt Relative to physical activity/exercise. Discussion of local fitness facilities and [employer] benefit for facility access and relevant activities for balance/LE strength  Sidestepping along counter x 2 min For hip abduction/single limb support  Retrowalk x 2 min Along counter  Tandem walk x 2 min Along counter  NU-step resistance intervals x 10 Instruction in sequence and goals of HIIT. 91 bpm at end of session       PATIENT EDUCATION: Education details: assessment details, rationale of intervention, HEP initiation Person educated: Patient Education method: Explanation Education comprehension: verbalized understanding  HOME EXERCISE PROGRAM: Access Code: [email or web address] URL: [email or web address] Date: 03/13/2023 Prepared by: Shary Decamp  Exercises - Sit to Stand with Arms Crossed  - 1 x daily - 7 x weekly - 5 sets - 5 reps - Corner Balance Feet Together With Eyes Closed  - 1 x daily - 7 x weekly - 3 sets - 30 sec hold - Corner Balance Feet Together: Eyes Open With Head Turns  - 1 x daily - 7 x weekly - 3 sets - 3 reps - Corner Balance Feet Together: Eyes Closed With Head Turns  - 1 x daily - 7 x weekly - 3 sets - 3 reps - Semi-Tandem Corner Balance With Eyes Open  - 1 x daily - 7 x weekly - 1-3 sets - 15 sec hold - Side Stepping with Counter Support  - 1 x daily - 7 x weekly - 1-2 sets - 1-2 minutes hold - Backward Walking with Counter Support  - 1 x daily - 7 x weekly - 1-2 sets - 1-2 min hold - Tandem Walking with Counter Support  - 1 x daily - 7 x weekly - 1-2 sets - 1-2 min hold  Note: Objective measures were completed at Evaluation unless otherwise noted.  DIAGNOSTIC FINDINGS: NCS/EMG of the legs performed by Dr. Neale Burly shows chronic sensorimotor axonal polyneuropathy.  COGNITION: Overall  cognitive status:  Within functional limits for tasks assessed   SENSATION: Not tested, known hx of BLE neuropathy  COORDINATION: WNL  EDEMA:  None noted  MUSCLE TONE: WNL     POSTURE: No Significant postural limitations  LOWER EXTREMITY ROM:     Active  Right Eval Left Eval  Hip flexion 100 100  Hip extension    Hip abduction    Hip adduction    Hip internal rotation    Hip external rotation    Knee flexion 95-100 95-100  Knee extension 0 0  Ankle dorsiflexion 15 15  Ankle plantarflexion    Ankle inversion    Ankle eversion     (Blank rows = not tested)  LOWER EXTREMITY MMT:   5/5 gross strength to manual muscle test  BED MOBILITY:  NT  TRANSFERS: Assistive device utilized: None  Sit to stand: Modified independence Stand to sit: Modified independence Chair to chair: Modified independence Floor:  NT      STAIRS: NT  GAIT: Gait pattern: wide BOS Distance walked:  Assistive device utilized: None Level of assistance: Complete Independence Comments:   FUNCTIONAL TESTS:  5 times sit to stand: 1 rep w/ lack of eccentric control Timed up and go (TUG): 12  sec. No AD Berg Balance Scale: 49/56  M-CTSIB  Condition 1: Firm Surface, EO 30 Sec, Mild Sway  Condition 2: Firm Surface, EC 30 Sec, Mild and Moderate Sway  Condition 3: Foam Surface, EO 30 Sec, Mild Sway  Condition 4: Foam Surface, EC 30 Sec, Moderate Sway      TODAY'S TREATMENT:                                                                                                                              DATE: 03/13/23      GOALS: Goals reviewed with patient? Yes  SHORT TERM GOALS: Target date: 04/03/2023    Patient will be independent in HEP to improve functional outcomes Baseline: Goal status: IN PROGRESS    LONG TERM GOALS: Target date: 04/24/2023    Demo improved BLE strength as evidenced by ability to perform 5xSTS test in 25 sec Baseline: 1 rep, requires use of hands, no  eccentric control in last few degrees of lowering Goal status: INITIAL  2.  Demo improved postural stability per mild sway x 30 sec condition 4 M-CTSIB to improve safety with ADL Baseline: moderate x 30 sec Goal status: INITIAL  3.  Demo improved balance and stability with single limb support as evidenced by increase score of 52/56 Berg Balance Test to reduce risk for falls Baseline: 49/56 Goal status: INITIAL  4.  Report improved functional performance as evidenced by ability to arise from couch without struggle Baseline: unable Goal status: INITIAL    ASSESSMENT:  CLINICAL IMPRESSION: Session initiated with review of HEP demo excellent recall and progressed to use of foam balance pad for multisensory challenge and to add to HEP as he has one  at home.  For sit to stand able to add weight of two, 5# dumbells in front rack position to improve BLE strength/power requiring seat height of 22" to enable no UE use.  Proceeded with instruction in dynamic balance activities to add for HEP to improve single limb stance stability and improved ability to manage narrow BOS to improve balance and reduce risk for falls. Ended session with NU-step for instruction in HIIT for improved fitness and benefit of weight mgmt and general health. Verbalizes understanding of education. Continued sessions to progress POC details  OBJECTIVE IMPAIRMENTS: Abnormal gait, decreased activity tolerance, decreased balance, decreased ROM, decreased strength, and improper body mechanics.   ACTIVITY LIMITATIONS: carrying, bending, squatting, stairs, transfers, and locomotion level  PARTICIPATION LIMITATIONS: cleaning, laundry, community activity, yard work, and exercise routine  PERSONAL FACTORS: Age, Time since onset of injury/illness/exacerbation, and 1-2 comorbidities: PMH  are also affecting patient's functional outcome.   REHAB POTENTIAL: Good  CLINICAL DECISION MAKING: Stable/uncomplicated  EVALUATION COMPLEXITY:  Low  PLAN:  PT FREQUENCY: 1x/week  PT DURATION: 6 weeks  PLANNED INTERVENTIONS: 97110-Therapeutic exercises, 97530- Therapeutic activity, [id number]- Neuromuscular re-education, 97535- Self Care, [id number]- Manual therapy, [phone]- Gait training, [phone]- Canalith repositioning, [phone]- Aquatic Therapy, Patient/Family education, Balance training, Stair training, Dry Needling, Joint mobilization, Spinal mobilization, DME instructions, Cryotherapy, and Moist heat  PLAN FOR NEXT SESSION: corner balance on foam review, sit to stand on foam if able (and additions), add resistance loops to sidestepping and for forward/backward monster walk   9:33 AM, 03/20/23 M. Shary Decamp, PT, DPT Physical Therapist- [place] Office Number: [phone]

## 2023-03-24 NOTE — Therapy (Signed)
OUTPATIENT PHYSICAL THERAPY NEURO TREATMENT   Patient Name: Ronald Hull MRN: [id number] DOB:04-25-1946, 77 y.o., male Today's Date: 03/25/2023   PCP: Renford Dills, MD REFERRING PROVIDER: Glendale Chard, DO  END OF SESSION:  PT End of Session - 03/25/23 1010     Visit Number 3    Number of Visits 7    Date for PT Re-Evaluation 04/24/23    Authorization Type BCBS Medicare    PT Start Time 0932    PT Stop Time 1010    PT Time Calculation (min) 38 min    Activity Tolerance Patient tolerated treatment well    Behavior During Therapy WFL for tasks assessed/performed              Past Medical History:  Diagnosis Date   Arthritis    Bronchitis 05/23/2018   CAD (coronary artery disease) 02/24/2018   LHC 10/19: pLAD 50, mLAD 50 (dFFR=0.89), oLCx 95, pRCA 30, mRCA 80 >> PCI: DES to LCx; DES to RCA // Xience[employer] Trial participant (will DC Plavix after 30 d and remain on ASA + Apixaban)   Chest pain secondary to ablation, with expected mild elevation in troponin 05/22/2018   Complication of anesthesia    BP dropped post colonoscopy   Coronary arteriosclerosis 02/24/2018   Essential hypertension 03/12/2018   Fracture of proximal phalanx of finger 10/24/2019   Hypercholesteremia    Hyperlipidemia 03/12/2018   Lung nodules 02/25/2019   Chest CT 01/2019: LUL nodule unchanged (benign); new LUL nodules 6 mm, aortic atherosclerosis, emphysema, partially calcified pleural plaques suggesting prior empyema or hemothorax   Obstructive sleep apnea    uses CPAP nightly   Pain of left hand 11/22/2019   Paroxysmal atrial fibrillation    persistent    Presence of Watchman left atrial appendage closure device 08/08/2021   Watchman 27mm with Dr. Lalla Brothers   Secondary hypercoagulable state 04/05/2019   Wears glasses    Past Surgical History:  Procedure Laterality Date   ATRIAL FIBRILLATION ABLATION N/A 05/20/2018   Procedure: ATRIAL FIBRILLATION ABLATION;  Surgeon: Regan Lemming, MD;  Location: [facility] INVASIVE CV LAB;  Service: Cardiovascular;  Laterality: N/A;   ATRIAL FIBRILLATION ABLATION N/A 09/02/2019   Procedure: ATRIAL FIBRILLATION ABLATION;  Surgeon: Regan Lemming, MD;  Location: [facility];  Service: Cardiovascular;  Laterality: N/A;   CARDIOVERSION N/A 11/01/2015   Procedure: CARDIOVERSION;  Surgeon: [name], MD;  Location: [facility] ENDOSCOPY;  Service: Cardiovascular;  Laterality: N/A;   CARDIOVERSION N/A 11/23/2015   Procedure: CARDIOVERSION;  Surgeon: Will [name], MD;  Location: [facility] ENDOSCOPY;  Service: Cardiovascular;  Laterality: N/A;   CARDIOVERSION N/A 10/12/2017   Procedure: CARDIOVERSION;  Surgeon: [name], MD;  Location: [facility] ENDOSCOPY;  Service: Cardiovascular;  Laterality: N/A;   CARDIOVERSION N/A 06/30/2018   Procedure: CARDIOVERSION;  Surgeon: Jake Bathe, MD;  Location: [facility] ENDOSCOPY;  Service: Cardiovascular;  Laterality: N/A;   CARDIOVERSION N/A 09/22/2018   Procedure: CARDIOVERSION;  Surgeon: Quintella Reichert, MD;  Location: [facility] ENDOSCOPY;  Service: Cardiovascular;  Laterality: N/A;   CARDIOVERSION N/A 07/15/2019   Procedure: CARDIOVERSION;  Surgeon: Jodelle Red, MD;  Location: [facility] ENDOSCOPY;  Service: Cardiovascular;  Laterality: N/A;   CARDIOVERSION N/A 12/07/2019   Procedure: CARDIOVERSION;  Surgeon: Chilton Si, MD;  Location: [facility] ENDOSCOPY;  Service: Cardiovascular;  Laterality: N/A;   CARDIOVERSION N/A 03/19/2020   Procedure: CARDIOVERSION;  Surgeon: Quintella Reichert, MD;  Location: [facility] ENDOSCOPY;  Service: Cardiovascular;  Laterality: N/A;  COLONOSCOPY     CORONARY PRESSURE/FFR STUDY N/A 02/24/2018   Procedure: INTRAVASCULAR PRESSURE WIRE/FFR STUDY;  Surgeon: Tonny Bollman, MD;  Location: [facility] INVASIVE CV LAB;  Service: Cardiovascular;  Laterality: N/A;   CORONARY STENT INTERVENTION N/A 02/24/2018   Procedure: CORONARY STENT INTERVENTION;  Surgeon: Tonny Bollman, MD;  Location: [facility] INVASIVE CV  LAB;  Service: Cardiovascular;  Laterality: N/A;   I & D EXTREMITY Left 10/19/2019   Procedure: IRRIGATION AND DEBRIDEMENT LEFT SMALL PROXIMAL PHALANX;  Surgeon: Bradly Bienenstock, MD;  Location: [facility];  Service: Orthopedics;  Laterality: Left;   KNEE ARTHROSCOPY  2000   LEFT ATRIAL APPENDAGE OCCLUSION N/A 08/08/2021   Procedure: LEFT ATRIAL APPENDAGE OCCLUSION;  Surgeon: Lanier Prude, MD;  Location: [facility];  Service: Cardiovascular;  Laterality: N/A;   LEFT HEART CATH AND CORONARY ANGIOGRAPHY N/A 02/24/2018   Procedure: LEFT HEART CATH AND CORONARY ANGIOGRAPHY;  Surgeon: Tonny Bollman, MD;  Location: [facility] INVASIVE CV LAB;  Service: Cardiovascular;  Laterality: N/A;   OPEN REDUCTION INTERNAL FIXATION (ORIF) PROXIMAL PHALANX Left 10/19/2019   Procedure: OPEN REDUCTION INTERNAL FIXATION (ORIF) PROXIMAL PHALANX WITH K-WIRES LEFT SMALL;  Surgeon: Bradly Bienenstock, MD;  Location: [facility];  Service: Orthopedics;  Laterality: Left;   SHOULDER ARTHROSCOPY WITH ROTATOR CUFF REPAIR AND SUBACROMIAL DECOMPRESSION Right 07/15/2012   Procedure: RIGHT ARTHROSCOPY SHOULDER DECOMPRESSION  SUBACROMIAL PARTIAL ACROMIOPLASTY WITH CORACOACROMIAL RELEASE,  DISTAL CLAVICULECTOMY, resect biceps debride labrium WITH ROTATOR CUFF REPAIR;  Surgeon: [name], MD;  Location: [facility];  Service: Orthopedics;  Laterality: Right;   TEE WITHOUT CARDIOVERSION N/A 08/08/2021   Procedure: TRANSESOPHAGEAL ECHOCARDIOGRAM (TEE);  Surgeon: Lanier Prude, MD;  Location: [facility] INVASIVE CV LAB;  Service: Cardiovascular;  Laterality: N/A;   Patient Active Problem List   Diagnosis Date Noted   Obstructive sleep apnea 02/17/2022   Presence of Watchman left atrial appendage closure device 08/08/2021   Pain of left hand 11/22/2019   Secondary hypercoagulable state 04/05/2019   Lung nodules 02/25/2019   Paroxysmal atrial fibrillation    Hyperlipidemia 03/12/2018    Essential hypertension 03/12/2018   CAD (coronary artery disease) 02/24/2018   Coronary arteriosclerosis 02/24/2018    ONSET DATE: "couple of years"  REFERRING DIAG: R26.81 (ICD-10-CM) - Unsteady gait G62.9 (ICD-10-CM) - Neuropathy R29.898 (ICD-10-CM) - Leg weakness, bilateral  THERAPY DIAG:  Unsteadiness on feet  Muscle weakness (generalized)  Other symptoms and signs involving the musculoskeletal system  Rationale for Evaluation and Treatment: Rehabilitation  SUBJECTIVE:  SUBJECTIVE STATEMENT: I'm sore today. Reports muscle soreness.   Pt accompanied by: self  PERTINENT HISTORY: CAD, atrial fibrllation, and hypertension returning to the clinic for follow-up of neuropathy affecting the feet and bilateral hand paresthesias.he began having sensation that his feet are wrapped in gauze or walking on sand. Symptoms became more bothersome since the summer of 2022. He denies burning or tingling. It involves the soles and balls of the feet. Symptoms are noticeable when he is resting or in bed. Balance is getting worse for sometime. He has fallen several times this year, one with injury. He does not use a cane and has not done PT.   PAIN:  Are you having pain? No  PRECAUTIONS: None  RED FLAGS: None   WEIGHT BEARING RESTRICTIONS: No  FALLS: Has patient fallen in last 6 months?  Notes near falls over past 6 months  LIVING ENVIRONMENT: Lives with: lives with their spouse Lives in: House/apartment Stairs:  flight of stairs, requires step-to pattern if no rails, reciprocal with rails Has following equipment at home: None  PLOF: Independent He lives at home with his wife in a 2-level home. He works as a Estate manager/land agent. He walks unassisted and continues to walk 2 miles per day (over course of  the day, not all at one)  PATIENT GOALS: improve balance and power in LE  OBJECTIVE:     TODAY'S TREATMENT: 03/25/23 Activity Comments  Nustep L5 x 6 min UEs/LEs  Maintaining ~ 90-100SPM  STS EC 10x Seated on airex; cues for anterior trunk lean to assist in transfer rather than heavy UE push; CGA-min A for safety  STS on foam 5x  Seated on airex; good ability to perform anterior trunk lean for easer transfer   wall bumps EO/EC, then on foam Good coordination and sequencing; cueing for slower pace/max control   lateral step ups R 4", L 6" 10x each  Quad instability B, but c/o hx R knee pain, thus did shorter step on that side   LAQ 10x each 4# Cues to lower with control   SLR 10x each  Cues for quad set before each rep; harder on R LE     HOME EXERCISE PROGRAM: Access Code: [email or web address] URL: [email or web address] Date: 03/25/2023 Prepared by: [facility] - Outpatient  Rehab - [facility]  Exercises - Sit to Stand with Arms Crossed  - 1 x daily - 7 x weekly - 5 sets - 5 reps - Corner Balance Feet Together With Eyes Closed  - 1 x daily - 7 x weekly - 3 sets - 30 sec hold - Corner Balance Feet Together: Eyes Open With Head Turns  - 1 x daily - 7 x weekly - 3 sets - 3 reps - Corner Balance Feet Together: Eyes Closed With Head Turns  - 1 x daily - 7 x weekly - 3 sets - 3 reps - Semi-Tandem Corner Balance With Eyes Open  - 1 x daily - 7 x weekly - 1-3 sets - 15 sec hold - Side Stepping with Counter Support  - 1 x daily - 7 x weekly - 1-2 sets - 1-2 minutes hold - Backward Walking with Counter Support  - 1 x daily - 7 x weekly - 1-2 sets - 1-2 min hold - Tandem Walking with Counter Support  - 1 x daily - 7 x weekly - 1-2 sets - 1-2 min hold - Seated Long Arc Quad  - 1 x daily - 5 x weekly - 2  sets - 10 reps - Supine Active Straight Leg Raise  - 1 x daily - 5 x weekly - 2 sets - 10 reps   PATIENT EDUCATION: Education details: HEP update Person educated: Patient Education  method: Explanation, Demonstration, Tactile cues, Verbal cues, and Handouts Education comprehension: verbalized understanding and returned demonstration     Note: Objective measures were completed at Evaluation unless otherwise noted.  DIAGNOSTIC FINDINGS: NCS/EMG of the legs performed by Dr. Neale Burly shows chronic sensorimotor axonal polyneuropathy.  COGNITION: Overall cognitive status: Within functional limits for tasks assessed   SENSATION: Not tested, known hx of BLE neuropathy  COORDINATION: WNL  EDEMA:  None noted  MUSCLE TONE: WNL     POSTURE: No Significant postural limitations  LOWER EXTREMITY ROM:     Active  Right Eval Left Eval  Hip flexion 100 100  Hip extension    Hip abduction    Hip adduction    Hip internal rotation    Hip external rotation    Knee flexion 95-100 95-100  Knee extension 0 0  Ankle dorsiflexion 15 15  Ankle plantarflexion    Ankle inversion    Ankle eversion     (Blank rows = not tested)  LOWER EXTREMITY MMT:   5/5 gross strength to manual muscle test  BED MOBILITY:  NT  TRANSFERS: Assistive device utilized: None  Sit to stand: Modified independence Stand to sit: Modified independence Chair to chair: Modified independence Floor:  NT      STAIRS: NT  GAIT: Gait pattern: wide BOS Distance walked:  Assistive device utilized: None Level of assistance: Complete Independence Comments:   FUNCTIONAL TESTS:  5 times sit to stand: 1 rep w/ lack of eccentric control Timed up and go (TUG): 12  sec. No AD Berg Balance Scale: 49/56  M-CTSIB  Condition 1: Firm Surface, EO 30 Sec, Mild Sway  Condition 2: Firm Surface, EC 30 Sec, Mild and Moderate Sway  Condition 3: Foam Surface, EO 30 Sec, Mild Sway  Condition 4: Foam Surface, EC 30 Sec, Moderate Sway      TODAY'S TREATMENT:                                                                                                                              DATE: 03/13/23       GOALS: Goals reviewed with patient? Yes  SHORT TERM GOALS: Target date: 04/03/2023    Patient will be independent in HEP to improve functional outcomes Baseline: Goal status: IN PROGRESS    LONG TERM GOALS: Target date: 04/24/2023    Demo improved BLE strength as evidenced by ability to perform 5xSTS test in 25 sec Baseline: 1 rep, requires use of hands, no eccentric control in last few degrees of lowering Goal status: INITIAL  2.  Demo improved postural stability per mild sway x 30 sec condition 4 M-CTSIB to improve safety with ADL Baseline: moderate x 30 sec Goal status: INITIAL  3.  Demo improved balance and stability with single limb support as evidenced by increase score of 52/56 Berg Balance Test to reduce risk for falls Baseline: 49/56 Goal status: INITIAL  4.  Report improved functional performance as evidenced by ability to arise from couch without struggle Baseline: unable Goal status: INITIAL    ASSESSMENT:  CLINICAL IMPRESSION: Patient arrived to session with report of muscle soreness. Worked on transfers with additional balance challenges. Required cues to increase anterior trunk lean for ease of transfer. Proceeded with activities utilizing balance strategies and working on quadriceps strength. Provided patient with quad strengthening to be performed in sitting and supine to avoid exacerbation of baseline R knee pain. Patient tolerated these exercises well. No complaints at end of session.   OBJECTIVE IMPAIRMENTS: Abnormal gait, decreased activity tolerance, decreased balance, decreased ROM, decreased strength, and improper body mechanics.   ACTIVITY LIMITATIONS: carrying, bending, squatting, stairs, transfers, and locomotion level  PARTICIPATION LIMITATIONS: cleaning, laundry, community activity, yard work, and exercise routine  PERSONAL FACTORS: Age, Time since onset of injury/illness/exacerbation, and 1-2 comorbidities: PMH  are also affecting  patient's functional outcome.   REHAB POTENTIAL: Good  CLINICAL DECISION MAKING: Stable/uncomplicated  EVALUATION COMPLEXITY: Low  PLAN:  PT FREQUENCY: 1x/week  PT DURATION: 6 weeks  PLANNED INTERVENTIONS: 97110-Therapeutic exercises, 97530- Therapeutic activity, 97112- Neuromuscular re-education, [phone]- Self Care, 29528- Manual therapy, [phone]- Gait training, [phone]- Canalith repositioning, [phone]- Aquatic Therapy, Patient/Family education, Balance training, Stair training, Dry Needling, Joint mobilization, Spinal mobilization, DME instructions, Cryotherapy, and Moist heat  PLAN FOR NEXT SESSION: corner balance on foam review, sit to stand on foam if able (and additions), add resistance loops to sidestepping and for forward/backward monster walk    Anette Guarneri, PT, DPT 03/25/23 10:13 AM  [facility] at [facility] [address] [place], [place] [address] Phone # [phone] Fax # [phone]

## 2023-03-25 ENCOUNTER — Ambulatory Visit: Payer: Medicare Other | Admitting: Physical Therapy

## 2023-03-25 ENCOUNTER — Encounter: Payer: Self-pay | Admitting: Physical Therapy

## 2023-03-25 DIAGNOSIS — M6281 Muscle weakness (generalized): Secondary | ICD-10-CM

## 2023-03-25 DIAGNOSIS — R29898 Other symptoms and signs involving the musculoskeletal system: Secondary | ICD-10-CM | POA: Diagnosis not present

## 2023-03-25 DIAGNOSIS — R2681 Unsteadiness on feet: Secondary | ICD-10-CM

## 2023-03-25 DIAGNOSIS — G629 Polyneuropathy, unspecified: Secondary | ICD-10-CM | POA: Diagnosis not present

## 2023-04-01 ENCOUNTER — Encounter: Payer: Self-pay | Admitting: Physical Therapy

## 2023-04-01 ENCOUNTER — Ambulatory Visit: Payer: Medicare Other | Attending: Neurology | Admitting: Physical Therapy

## 2023-04-01 DIAGNOSIS — M6281 Muscle weakness (generalized): Secondary | ICD-10-CM

## 2023-04-01 DIAGNOSIS — I10 Essential (primary) hypertension: Secondary | ICD-10-CM | POA: Diagnosis not present

## 2023-04-01 DIAGNOSIS — R2681 Unsteadiness on feet: Secondary | ICD-10-CM | POA: Diagnosis not present

## 2023-04-01 DIAGNOSIS — G4733 Obstructive sleep apnea (adult) (pediatric): Secondary | ICD-10-CM | POA: Diagnosis not present

## 2023-04-01 DIAGNOSIS — R29898 Other symptoms and signs involving the musculoskeletal system: Secondary | ICD-10-CM | POA: Diagnosis not present

## 2023-04-01 DIAGNOSIS — I4891 Unspecified atrial fibrillation: Secondary | ICD-10-CM | POA: Diagnosis not present

## 2023-04-01 DIAGNOSIS — Z0000 Encounter for general adult medical examination without abnormal findings: Secondary | ICD-10-CM | POA: Diagnosis not present

## 2023-04-01 NOTE — Therapy (Signed)
OUTPATIENT PHYSICAL THERAPY NEURO TREATMENT   Patient Name: Ronald Hull MRN: [id number] DOB:1945-06-05, 77 y.o., male Today's Date: 04/01/2023   PCP: Renford Dills, MD REFERRING PROVIDER: Glendale Chard, DO  END OF SESSION:  PT End of Session - 04/01/23 0936     Visit Number 4    Number of Visits 7    Date for PT Re-Evaluation 04/24/23    Authorization Type BCBS Medicare    PT Start Time [phone]    PT Stop Time 1016    PT Time Calculation (min) 40 min    Activity Tolerance Patient tolerated treatment well    Behavior During Therapy [facility] for tasks assessed/performed               Past Medical History:  Diagnosis Date   Arthritis    Bronchitis 05/23/2018   CAD (coronary artery disease) 02/24/2018   LHC 10/19: pLAD 50, mLAD 50 (dFFR=0.89), oLCx 95, pRCA 30, mRCA 80 >> PCI: DES to LCx; DES to RCA // [id number] Trial participant (will DC Plavix after 30 d and remain on ASA + Apixaban)   Chest pain secondary to ablation, with expected mild elevation in troponin 05/22/2018   Complication of anesthesia    BP dropped post colonoscopy   Coronary arteriosclerosis 02/24/2018   Essential hypertension 03/12/2018   Fracture of proximal phalanx of finger 10/24/2019   Hypercholesteremia    Hyperlipidemia 03/12/2018   Lung nodules 02/25/2019   Chest CT 01/2019: LUL nodule unchanged (benign); new LUL nodules 6 mm, aortic atherosclerosis, emphysema, partially calcified pleural plaques suggesting prior empyema or hemothorax   Obstructive sleep apnea    uses CPAP nightly   Pain of left hand 11/22/2019   Paroxysmal atrial fibrillation    persistent    Presence of Watchman left atrial appendage closure device 08/08/2021   Watchman 27mm with Dr. Lalla Brothers   Secondary hypercoagulable state 04/05/2019   Wears glasses    Past Surgical History:  Procedure Laterality Date   ATRIAL FIBRILLATION ABLATION N/A 05/20/2018   Procedure: ATRIAL FIBRILLATION ABLATION;  Surgeon: Regan Lemming, MD;  Location: [facility] INVASIVE CV LAB;  Service: Cardiovascular;  Laterality: N/A;   ATRIAL FIBRILLATION ABLATION N/A 09/02/2019   Procedure: ATRIAL FIBRILLATION ABLATION;  Surgeon: Regan Lemming, MD;  Location: [facility] INVASIVE CV LAB;  Service: Cardiovascular;  Laterality: N/A;   CARDIOVERSION N/A 11/01/2015   Procedure: CARDIOVERSION;  Surgeon: [name], MD;  Location: [facility] ENDOSCOPY;  Service: Cardiovascular;  Laterality: N/A;   CARDIOVERSION N/A 11/23/2015   Procedure: CARDIOVERSION;  Surgeon: Will [name], MD;  Location: [facility] ENDOSCOPY;  Service: Cardiovascular;  Laterality: N/A;   CARDIOVERSION N/A 10/12/2017   Procedure: CARDIOVERSION;  Surgeon: [name], MD;  Location: [facility] ENDOSCOPY;  Service: Cardiovascular;  Laterality: N/A;   CARDIOVERSION N/A 06/30/2018   Procedure: CARDIOVERSION;  Surgeon: Jake Bathe, MD;  Location: [facility] ENDOSCOPY;  Service: Cardiovascular;  Laterality: N/A;   CARDIOVERSION N/A 09/22/2018   Procedure: CARDIOVERSION;  Surgeon: Quintella Reichert, MD;  Location: [facility] ENDOSCOPY;  Service: Cardiovascular;  Laterality: N/A;   CARDIOVERSION N/A 07/15/2019   Procedure: CARDIOVERSION;  Surgeon: Jodelle Red, MD;  Location: [facility] ENDOSCOPY;  Service: Cardiovascular;  Laterality: N/A;   CARDIOVERSION N/A 12/07/2019   Procedure: CARDIOVERSION;  Surgeon: Chilton Si, MD;  Location: [facility] ENDOSCOPY;  Service: Cardiovascular;  Laterality: N/A;   CARDIOVERSION N/A 03/19/2020   Procedure: CARDIOVERSION;  Surgeon: Quintella Reichert, MD;  Location: [facility] ENDOSCOPY;  Service: Cardiovascular;  Laterality:  N/A;   COLONOSCOPY     CORONARY PRESSURE/FFR STUDY N/A 02/24/2018   Procedure: INTRAVASCULAR PRESSURE WIRE/FFR STUDY;  Surgeon: Tonny Bollman, MD;  Location: [facility] INVASIVE CV LAB;  Service: Cardiovascular;  Laterality: N/A;   CORONARY STENT INTERVENTION N/A 02/24/2018   Procedure: CORONARY STENT INTERVENTION;  Surgeon: Tonny Bollman, MD;  Location: [facility] INVASIVE CV  LAB;  Service: Cardiovascular;  Laterality: N/A;   I & D EXTREMITY Left 10/19/2019   Procedure: IRRIGATION AND DEBRIDEMENT LEFT SMALL PROXIMAL PHALANX;  Surgeon: Bradly Bienenstock, MD;  Location: [facility];  Service: Orthopedics;  Laterality: Left;   KNEE ARTHROSCOPY  2000   LEFT ATRIAL APPENDAGE OCCLUSION N/A 08/08/2021   Procedure: LEFT ATRIAL APPENDAGE OCCLUSION;  Surgeon: Lanier Prude, MD;  Location: [facility]VASIVE CV LAB;  Service: Cardiovascular;  Laterality: N/A;   LEFT HEART CATH AND CORONARY ANGIOGRAPHY N/A 02/24/2018   Procedure: LEFT HEART CATH AND CORONARY ANGIOGRAPHY;  Surgeon: Tonny Bollman, MD;  Location: [facility] INVASIVE CV LAB;  Service: Cardiovascular;  Laterality: N/A;   OPEN REDUCTION INTERNAL FIXATION (ORIF) PROXIMAL PHALANX Left 10/19/2019   Procedure: OPEN REDUCTION INTERNAL FIXATION (ORIF) PROXIMAL PHALANX WITH K-WIRES LEFT SMALL;  Surgeon: Bradly Bienenstock, MD;  Location: [facility];  Service: Orthopedics;  Laterality: Left;   SHOULDER ARTHROSCOPY WITH ROTATOR CUFF REPAIR AND SUBACROMIAL DECOMPRESSION Right 07/15/2012   Procedure: RIGHT ARTHROSCOPY SHOULDER DECOMPRESSION  SUBACROMIAL PARTIAL ACROMIOPLASTY WITH CORACOACROMIAL RELEASE,  DISTAL CLAVICULECTOMY, resect biceps debride labrium WITH ROTATOR CUFF REPAIR;  Surgeon: [name], MD;  Location: [facility];  Service: Orthopedics;  Laterality: Right;   TEE WITHOUT CARDIOVERSION N/A 08/08/2021   Procedure: TRANSESOPHAGEAL ECHOCARDIOGRAM (TEE);  Surgeon: Lanier Prude, MD;  Location: [facility] INVASIVE CV LAB;  Service: Cardiovascular;  Laterality: N/A;   Patient Active Problem List   Diagnosis Date Noted   Obstructive sleep apnea 02/17/2022   Presence of Watchman left atrial appendage closure device 08/08/2021   Pain of left hand 11/22/2019   Secondary hypercoagulable state 04/05/2019   Lung nodules 02/25/2019   Paroxysmal atrial fibrillation    Hyperlipidemia 03/12/2018    Essential hypertension 03/12/2018   CAD (coronary artery disease) 02/24/2018   Coronary arteriosclerosis 02/24/2018    ONSET DATE: "couple of years"  REFERRING DIAG: R26.81 (ICD-10-CM) - Unsteady gait G62.9 (ICD-10-CM) - Neuropathy R29.898 (ICD-10-CM) - Leg weakness, bilateral  THERAPY DIAG:  Unsteadiness on feet  Muscle weakness (generalized)  Other symptoms and signs involving the musculoskeletal system  Rationale for Evaluation and Treatment: Rehabilitation  SUBJECTIVE:  SUBJECTIVE STATEMENT: No changes, things are the same.  Pt accompanied by: self  PERTINENT HISTORY: CAD, atrial fibrllation, and hypertension returning to the clinic for follow-up of neuropathy affecting the feet and bilateral hand paresthesias.he began having sensation that his feet are wrapped in gauze or walking on sand. Symptoms became more bothersome since the summer of 2022. He denies burning or tingling. It involves the soles and balls of the feet. Symptoms are noticeable when he is resting or in bed. Balance is getting worse for sometime. He has fallen several times this year, one with injury. He does not use a cane and has not done PT.   PAIN:  Are you having pain? No  PRECAUTIONS: None  RED FLAGS: None   WEIGHT BEARING RESTRICTIONS: No  FALLS: Has patient fallen in last 6 months?  Notes near falls over past 6 months  LIVING ENVIRONMENT: Lives with: lives with their spouse Lives in: House/apartment Stairs:  flight of stairs, requires step-to pattern if no rails, reciprocal with rails Has following equipment at home: None  PLOF: Independent He lives at home with his wife in a 2-level home. He works as a Estate manager/land agent. He walks unassisted and continues to walk 2 miles per day (over course of the day,  not all at one)  PATIENT GOALS: improve balance and power in LE  OBJECTIVE:   Verbal and demo review of HEP  TODAY'S TREATMENT: 04/01/2023 Activity Comments  STS from 24" mat, 5 reps Cues for eccentric control  LAQ 4#, 2 x 10 Cues for slowed control  Seated march, 10 reps, 4#   Standing minisquats 2 x 10   HR 71 bpm, O2 sats 97%   Standing minisquat, 3 x 5 reps Pain in R knee   Seated hamstring stretch, 3 x 30 sec, foot propped on 4" block   Corner balance: EC 30 sec, then EC + head turns/head nods  Partial heel/toe EO/EC 30 sec Increased unsteadiness EC with heel/toe position  Standing on incline/decline with head turns/head nods EO then EC x 30 sec Min sway  NuStep Level 5>6, 4 extremities x 6 minutes    HOME EXERCISE PROGRAM: Access Code: [email or web address] URL: [email or web address] Date: 04/01/2023 Prepared by: [facility] - [facility]  Exercises - Sit to Stand with Arms Crossed  - 1 x daily - 7 x weekly - 5 sets - 5 reps - Corner Balance Feet Together With Eyes Closed  - 1 x daily - 7 x weekly - 3 sets - 30 sec hold - Corner Balance Feet Together: Eyes Open With Head Turns  - 1 x daily - 7 x weekly - 3 sets - 3 reps - Corner Balance Feet Together: Eyes Closed With Head Turns  - 1 x daily - 7 x weekly - 3 sets - 3 reps - Semi-Tandem Corner Balance With Eyes Open  - 1 x daily - 7 x weekly - 1-3 sets - 15 sec hold - Side Stepping with Counter Support  - 1 x daily - 7 x weekly - 1-2 sets - 1-2 minutes hold - Backward Walking with Counter Support  - 1 x daily - 7 x weekly - 1-2 sets - 1-2 min hold - Tandem Walking with Counter Support  - 1 x daily - 7 x weekly - 1-2 sets - 1-2 min hold - Seated Long Arc Quad  - 1 x daily - 5 x weekly - 2 sets - 10 reps - Supine Active  Straight Leg Raise  - 1 x daily - 5 x weekly - 2 sets - 10 reps - Seated Hamstring Stretch  - 1-2 x daily - 7 x weekly - 1 sets - 3 reps - 30 sec hold       PATIENT  EDUCATION: Education details: HEP update Person educated: Patient Education method: Explanation, Demonstration, Tactile cues, Verbal cues, and Handouts Education comprehension: verbalized understanding and returned demonstration     Note: Objective measures were completed at Evaluation unless otherwise noted.  DIAGNOSTIC FINDINGS: NCS/EMG of the legs performed by Dr. Neale Burly shows chronic sensorimotor axonal polyneuropathy.  COGNITION: Overall cognitive status: Within functional limits for tasks assessed   SENSATION: Not tested, known hx of BLE neuropathy  COORDINATION: WNL  EDEMA:  None noted  MUSCLE TONE: WNL     POSTURE: No Significant postural limitations  LOWER EXTREMITY ROM:     Active  Right Eval Left Eval  Hip flexion 100 100  Hip extension    Hip abduction    Hip adduction    Hip internal rotation    Hip external rotation    Knee flexion 95-100 95-100  Knee extension 0 0  Ankle dorsiflexion 15 15  Ankle plantarflexion    Ankle inversion    Ankle eversion     (Blank rows = not tested)  LOWER EXTREMITY MMT:   5/5 gross strength to manual muscle test  BED MOBILITY:  NT  TRANSFERS: Assistive device utilized: None  Sit to stand: Modified independence Stand to sit: Modified independence Chair to chair: Modified independence Floor:  NT      STAIRS: NT  GAIT: Gait pattern: wide BOS Distance walked:  Assistive device utilized: None Level of assistance: Complete Independence Comments:   FUNCTIONAL TESTS:  5 times sit to stand: 1 rep w/ lack of eccentric control Timed up and go (TUG): 12  sec. No AD Berg Balance Scale: 49/56  M-CTSIB  Condition 1: Firm Surface, EO 30 Sec, Mild Sway  Condition 2: Firm Surface, EC 30 Sec, Mild and Moderate Sway  Condition 3: Foam Surface, EO 30 Sec, Mild Sway  Condition 4: Foam Surface, EC 30 Sec, Moderate Sway      TODAY'S TREATMENT:                                                                                                                               DATE: 03/13/23      GOALS: Goals reviewed with patient? Yes  SHORT TERM GOALS: Target date: 04/03/2023    Patient will be independent in HEP to improve functional outcomes Baseline: Goal status: MET, 04/01/2023    LONG TERM GOALS: Target date: 04/24/2023    Demo improved BLE strength as evidenced by ability to perform 5xSTS test in 25 sec Baseline: 1 rep, requires use of hands, no eccentric control in last few degrees of lowering Goal status: INITIAL  2.  Demo improved postural stability per mild sway  x 30 sec condition 4 M-CTSIB to improve safety with ADL Baseline: moderate x 30 sec Goal status: INITIAL  3.  Demo improved balance and stability with single limb support as evidenced by increase score of 52/56 Berg Balance Test to reduce risk for falls Baseline: 49/56 Goal status: INITIAL  4.  Report improved functional performance as evidenced by ability to arise from couch without struggle Baseline: unable Goal status: INITIAL    ASSESSMENT:  CLINICAL IMPRESSION: Pt arrives today and reports that he is doing some better with sit to stand transfers at home, still has difficulty from low surfaces.  Assessed HEP and pt return demo/verbalizes understanding, so STG 1 met.  Worked on lower extremity strenghtening and balance activities today.  Unlevel surface today is the incline/decline in gym and pt has min sway with EC with this.  He will continue to benefit from skilled PT towards goals for improved strength and balance.  OBJECTIVE IMPAIRMENTS: Abnormal gait, decreased activity tolerance, decreased balance, decreased ROM, decreased strength, and improper body mechanics.   ACTIVITY LIMITATIONS: carrying, bending, squatting, stairs, transfers, and locomotion level  PARTICIPATION LIMITATIONS: cleaning, laundry, community activity, yard work, and exercise routine  PERSONAL FACTORS: Age, Time since onset of  injury/illness/exacerbation, and 1-2 comorbidities: PMH  are also affecting patient's functional outcome.   REHAB POTENTIAL: Good  CLINICAL DECISION MAKING: Stable/uncomplicated  EVALUATION COMPLEXITY: Low  PLAN:  PT FREQUENCY: 1x/week  PT DURATION: 6 weeks  PLANNED INTERVENTIONS: 97110-Therapeutic exercises, 97530- Therapeutic activity, 97112- Neuromuscular re-education, 97535- Self Care, 29528- Manual therapy, [phone]- Gait training, [phone]- Canalith repositioning, [phone]- Aquatic Therapy, Patient/Family education, Balance training, Stair training, Dry Needling, Joint mobilization, Spinal mobilization, DME instructions, Cryotherapy, and Moist heat  PLAN FOR NEXT SESSION: corner balance (on foam) review, sit to stand on foam if able , add resistance loops to sidestepping and for forward/backward monster walk    Lonia Blood, PT 04/01/23 10:15 AM Phone: [phone] Fax: [phone]  [facility] [address] [place], [place] [address] Phone # [phone] Fax # [phone]

## 2023-04-09 DIAGNOSIS — K080 Exfoliation of teeth due to systemic causes: Secondary | ICD-10-CM | POA: Diagnosis not present

## 2023-04-10 ENCOUNTER — Ambulatory Visit: Payer: Medicare Other

## 2023-04-10 DIAGNOSIS — M6281 Muscle weakness (generalized): Secondary | ICD-10-CM

## 2023-04-10 DIAGNOSIS — R29898 Other symptoms and signs involving the musculoskeletal system: Secondary | ICD-10-CM

## 2023-04-10 DIAGNOSIS — R2681 Unsteadiness on feet: Secondary | ICD-10-CM

## 2023-04-10 NOTE — Therapy (Signed)
OUTPATIENT PHYSICAL THERAPY NEURO TREATMENT   Patient Name: Ronald Hull MRN: [id number] DOB:March 08, 1946, 77 y.o., male Today's Date: 04/10/2023   PCP: Renford Dills, MD REFERRING PROVIDER: Glendale Chard, DO  END OF SESSION:  PT End of Session - 04/10/23 0942     Visit Number 5    Number of Visits 7    Date for PT Re-Evaluation 04/24/23    Authorization Type BCBS Medicare    PT Start Time 0935    PT Stop Time 1015    PT Time Calculation (min) 40 min    Activity Tolerance Patient tolerated treatment well    Behavior During Therapy [facility] for tasks assessed/performed               Past Medical History:  Diagnosis Date   Arthritis    Bronchitis 05/23/2018   CAD (coronary artery disease) 02/24/2018   LHC 10/19: pLAD 50, mLAD 50 (dFFR=0.89), oLCx 95, pRCA 30, mRCA 80 >> PCI: DES to LCx; DES to RCA // [id number] Trial participant (will DC Plavix after 30 d and remain on ASA + Apixaban)   Chest pain secondary to ablation, with expected mild elevation in troponin 05/22/2018   Complication of anesthesia    BP dropped post colonoscopy   Coronary arteriosclerosis 02/24/2018   Essential hypertension 03/12/2018   Fracture of proximal phalanx of finger 10/24/2019   Hypercholesteremia    Hyperlipidemia 03/12/2018   Lung nodules 02/25/2019   Chest CT 01/2019: LUL nodule unchanged (benign); new LUL nodules 6 mm, aortic atherosclerosis, emphysema, partially calcified pleural plaques suggesting prior empyema or hemothorax   Obstructive sleep apnea    uses CPAP nightly   Pain of left hand 11/22/2019   Paroxysmal atrial fibrillation    persistent    Presence of Watchman left atrial appendage closure device 08/08/2021   Watchman 27mm with Dr. Lalla Brothers   Secondary hypercoagulable state 04/05/2019   Wears glasses    Past Surgical History:  Procedure Laterality Date   ATRIAL FIBRILLATION ABLATION N/A 05/20/2018   Procedure: ATRIAL FIBRILLATION ABLATION;  Surgeon: Regan Lemming, MD;  Location: [facility] INVASIVE CV LAB;  Service: Cardiovascular;  Laterality: N/A;   ATRIAL FIBRILLATION ABLATION N/A 09/02/2019   Procedure: ATRIAL FIBRILLATION ABLATION;  Surgeon: Regan Lemming, MD;  Location: [facility]VAS[facility] CV [facility];  Service: Cardiovascular;  Laterality: N/A;   CARDIOVERSION N/A 11/01/2015   Procedure: CARDIOVERSION;  Surgeon: [name], MD;  Location: [facility] ENDOSCOPY;  Service: Cardiovascular;  Laterality: N/A;   CARDIOVERSION N/A 11/23/2015   Procedure: CARDIOVERSION;  Surgeon: Will [name], MD;  Location: [facility] ENDOSCOPY;  Service: Cardiovascular;  Laterality: N/A;   CARDIOVERSION N/A 10/12/2017   Procedure: CARDIOVERSION;  Surgeon: [name], MD;  Location: [facility] ENDOSCOPY;  Service: Cardiovascular;  Laterality: N/A;   CARDIOVERSION N/A 06/30/2018   Procedure: CARDIOVERSION;  Surgeon: Jake Bathe, MD;  Location: [facility] ENDOSCOPY;  Service: Cardiovascular;  Laterality: N/A;   CARDIOVERSION N/A 09/22/2018   Procedure: CARDIOVERSION;  Surgeon: Quintella Reichert, MD;  Location: [facility] ENDOSCOPY;  Service: Cardiovascular;  Laterality: N/A;   CARDIOVERSION N/A 07/15/2019   Procedure: CARDIOVERSION;  Surgeon: Jodelle Red, MD;  Location: [facility] ENDOSCOPY;  Service: Cardiovascular;  Laterality: N/A;   CARDIOVERSION N/A 12/07/2019   Procedure: CARDIOVERSION;  Surgeon: Chilton Si, MD;  Location: [facility] ENDOSCOPY;  Service: Cardiovascular;  Laterality: N/A;   CARDIOVERSION N/A 03/19/2020   Procedure: CARDIOVERSION;  Surgeon: Quintella Reichert, MD;  Location: [facility] ENDOSCOPY;  Service: Cardiovascular;  Laterality:  N/A;   COLONOSCOPY     CORONARY PRESSURE/FFR STUDY N/A 02/24/2018   Procedure: INTRAVASCULAR PRESSURE WIRE/FFR STUDY;  Surgeon: Tonny Bollman, MD;  Location: [facility] INVASIVE CV LAB;  Service: Cardiovascular;  Laterality: N/A;   CORONARY STENT INTERVENTION N/A 02/24/2018   Procedure: CORONARY STENT INTERVENTION;  Surgeon: Tonny Bollman, MD;  Location: [facility] INVASIVE CV  LAB;  Service: Cardiovascular;  Laterality: N/A;   I & D EXTREMITY Left 10/19/2019   Procedure: IRRIGATION AND DEBRIDEMENT LEFT SMALL PROXIMAL PHALANX;  Surgeon: Bradly Bienenstock, MD;  Location: [facility];  Service: Orthopedics;  Laterality: Left;   KNEE ARTHROSCOPY  2000   LEFT ATRIAL APPENDAGE OCCLUSION N/A 08/08/2021   Procedure: LEFT ATRIAL APPENDAGE OCCLUSION;  Surgeon: Lanier Prude, MD;  Location: [facility]VASIVE CV LAB;  Service: Cardiovascular;  Laterality: N/A;   LEFT HEART CATH AND CORONARY ANGIOGRAPHY N/A 02/24/2018   Procedure: LEFT HEART CATH AND CORONARY ANGIOGRAPHY;  Surgeon: Tonny Bollman, MD;  Location: [facility] INVASIVE CV LAB;  Service: Cardiovascular;  Laterality: N/A;   OPEN REDUCTION INTERNAL FIXATION (ORIF) PROXIMAL PHALANX Left 10/19/2019   Procedure: OPEN REDUCTION INTERNAL FIXATION (ORIF) PROXIMAL PHALANX WITH K-WIRES LEFT SMALL;  Surgeon: Bradly Bienenstock, MD;  Location: [facility];  Service: Orthopedics;  Laterality: Left;   SHOULDER ARTHROSCOPY WITH ROTATOR CUFF REPAIR AND SUBACROMIAL DECOMPRESSION Right 07/15/2012   Procedure: RIGHT ARTHROSCOPY SHOULDER DECOMPRESSION  SUBACROMIAL PARTIAL ACROMIOPLASTY WITH CORACOACROMIAL RELEASE,  DISTAL CLAVICULECTOMY, resect biceps debride labrium WITH ROTATOR CUFF REPAIR;  Surgeon: [name], MD;  Location: [facility];  Service: Orthopedics;  Laterality: Right;   TEE WITHOUT CARDIOVERSION N/A 08/08/2021   Procedure: TRANSESOPHAGEAL ECHOCARDIOGRAM (TEE);  Surgeon: Lanier Prude, MD;  Location: [facility] INVASIVE CV LAB;  Service: Cardiovascular;  Laterality: N/A;   Patient Active Problem List   Diagnosis Date Noted   Obstructive sleep apnea 02/17/2022   Presence of Watchman left atrial appendage closure device 08/08/2021   Pain of left hand 11/22/2019   Secondary hypercoagulable state 04/05/2019   Lung nodules 02/25/2019   Paroxysmal atrial fibrillation    Hyperlipidemia 03/12/2018    Essential hypertension 03/12/2018   CAD (coronary artery disease) 02/24/2018   Coronary arteriosclerosis 02/24/2018    ONSET DATE: "couple of years"  REFERRING DIAG: R26.81 (ICD-10-CM) - Unsteady gait G62.9 (ICD-10-CM) - Neuropathy R29.898 (ICD-10-CM) - Leg weakness, bilateral  THERAPY DIAG:  Unsteadiness on feet  Muscle weakness (generalized)  Other symptoms and signs involving the musculoskeletal system  Rationale for Evaluation and Treatment: Rehabilitation  SUBJECTIVE:  SUBJECTIVE STATEMENT: No changes, things are the same.  Pt accompanied by: self  PERTINENT HISTORY: CAD, atrial fibrllation, and hypertension returning to the clinic for follow-up of neuropathy affecting the feet and bilateral hand paresthesias.he began having sensation that his feet are wrapped in gauze or walking on sand. Symptoms became more bothersome since the summer of 2022. He denies burning or tingling. It involves the soles and balls of the feet. Symptoms are noticeable when he is resting or in bed. Balance is getting worse for sometime. He has fallen several times this year, one with injury. He does not use a cane and has not done PT.   PAIN:  Are you having pain? No  PRECAUTIONS: None  RED FLAGS: None   WEIGHT BEARING RESTRICTIONS: No  FALLS: Has patient fallen in last 6 months?  Notes near falls over past 6 months  LIVING ENVIRONMENT: Lives with: lives with their spouse Lives in: House/apartment Stairs:  flight of stairs, requires step-to pattern if no rails, reciprocal with rails Has following equipment at home: None  PLOF: Independent He lives at home with his wife in a 2-level home. He works as a Estate manager/land agent. He walks unassisted and continues to walk 2 miles per day (over course of the day,  not all at one)  PATIENT GOALS: improve balance and power in LE  OBJECTIVE:   TODAY'S TREATMENT: 04/10/23 Activity Comments  Dynamic balance warm-up Retro-walk 4x25 ft Sidestep 4x25 ft Tandem walk 2x25 ft  Sit to stand 1x10, 10# 24" seat height 22" seat height  Suitcase march 4x25 ft 15#  Deadlift hip hinge 1x10 15#, 20#, 4" step to limit travel, cues for hip hinge, improved carryover  LAQ 3x10 5#  Resisted walking x 2 min Backwards-forwards Forwards-backwards 20#  Gastroc stretch 2x60 sec On slantboard     Verbal and demo review of HEP  TODAY'S TREATMENT: 04/01/2023 Activity Comments  STS from 24" mat, 5 reps Cues for eccentric control  LAQ 4#, 2 x 10 Cues for slowed control  Seated march, 10 reps, 4#   Standing minisquats 2 x 10   HR 71 bpm, O2 sats 97%   Standing minisquat, 3 x 5 reps Pain in R knee   Seated hamstring stretch, 3 x 30 sec, foot propped on 4" block   Corner balance: EC 30 sec, then EC + head turns/head nods  Partial heel/toe EO/EC 30 sec Increased unsteadiness EC with heel/toe position  Standing on incline/decline with head turns/head nods EO then EC x 30 sec Min sway  NuStep Level 5>6, 4 extremities x 6 minutes    HOME EXERCISE PROGRAM: Access Code: [email or web address] URL: [email or web address] Date: 04/01/2023 Prepared by: [facility] - Outpatient  Rehab - [facility]  Exercises - Sit to Stand with Arms Crossed  - 1 x daily - 7 x weekly - 5 sets - 5 reps - Corner Balance Feet Together With Eyes Closed  - 1 x daily - 7 x weekly - 3 sets - 30 sec hold - Corner Balance Feet Together: Eyes Open With Head Turns  - 1 x daily - 7 x weekly - 3 sets - 3 reps - Corner Balance Feet Together: Eyes Closed With Head Turns  - 1 x daily - 7 x weekly - 3 sets - 3 reps - Semi-Tandem Corner Balance With Eyes Open  - 1 x daily - 7 x weekly - 1-3 sets - 15 sec hold - Side Stepping with Counter Support  - 1 x  daily - 7 x weekly - 1-2 sets - 1-2 minutes hold -  Backward Walking with Counter Support  - 1 x daily - 7 x weekly - 1-2 sets - 1-2 min hold - Tandem Walking with Counter Support  - 1 x daily - 7 x weekly - 1-2 sets - 1-2 min hold - Seated Long Arc Quad  - 1 x daily - 5 x weekly - 2 sets - 10 reps - Supine Active Straight Leg Raise  - 1 x daily - 5 x weekly - 2 sets - 10 reps - Seated Hamstring Stretch  - 1-2 x daily - 7 x weekly - 1 sets - 3 reps - 30 sec hold - Kettlebell Deadlift  - 1 x daily - 7 x weekly - 3 sets - 10 reps - Kettlebell Suitcase Carry  - 1 x daily - 7 x weekly - 3 sets - 10 reps      PATIENT EDUCATION: Education details: HEP update Person educated: Patient Education method: Explanation, Demonstration, Tactile cues, Verbal cues, and Handouts Education comprehension: verbalized understanding and returned demonstration     Note: Objective measures were completed at Evaluation unless otherwise noted.  DIAGNOSTIC FINDINGS: NCS/EMG of the legs performed by Dr. Neale Burly shows chronic sensorimotor axonal polyneuropathy.  COGNITION: Overall cognitive status: Within functional limits for tasks assessed   SENSATION: Not tested, known hx of BLE neuropathy  COORDINATION: WNL  EDEMA:  None noted  MUSCLE TONE: WNL     POSTURE: No Significant postural limitations  LOWER EXTREMITY ROM:     Active  Right Eval Left Eval  Hip flexion 100 100  Hip extension    Hip abduction    Hip adduction    Hip internal rotation    Hip external rotation    Knee flexion 95-100 95-100  Knee extension 0 0  Ankle dorsiflexion 15 15  Ankle plantarflexion    Ankle inversion    Ankle eversion     (Blank rows = not tested)  LOWER EXTREMITY MMT:   5/5 gross strength to manual muscle test  BED MOBILITY:  NT  TRANSFERS: Assistive device utilized: None  Sit to stand: Modified independence Stand to sit: Modified independence Chair to chair: Modified independence Floor:  NT      STAIRS: NT  GAIT: Gait pattern:  wide BOS Distance walked:  Assistive device utilized: None Level of assistance: Complete Independence Comments:   FUNCTIONAL TESTS:  5 times sit to stand: 1 rep w/ lack of eccentric control Timed up and go (TUG): 12  sec. No AD Berg Balance Scale: 49/56  M-CTSIB  Condition 1: Firm Surface, EO 30 Sec, Mild Sway  Condition 2: Firm Surface, EC 30 Sec, Mild and Moderate Sway  Condition 3: Foam Surface, EO 30 Sec, Mild Sway  Condition 4: Foam Surface, EC 30 Sec, Moderate Sway      TODAY'S TREATMENT:  DATE: 03/13/23      GOALS: Goals reviewed with patient? Yes  SHORT TERM GOALS: Target date: 04/03/2023    Patient will be independent in HEP to improve functional outcomes Baseline: Goal status: MET, 04/01/2023    LONG TERM GOALS: Target date: 04/24/2023    Demo improved BLE strength as evidenced by ability to perform 5xSTS test in 25 sec Baseline: 1 rep, requires use of hands, no eccentric control in last few degrees of lowering Goal status: IN PROGRESS  2.  Demo improved postural stability per mild sway x 30 sec condition 4 M-CTSIB to improve safety with ADL Baseline: moderate x 30 sec Goal status: IN PROGRESS  3.  Demo improved balance and stability with single limb support as evidenced by increase score of 52/56 Berg Balance Test to reduce risk for falls Baseline: 49/56 Goal status: IN PROGRESS  4.  Report improved functional performance as evidenced by ability to arise from couch without struggle Baseline: unable Goal status: IN PROGRESS    ASSESSMENT:  CLINICAL IMPRESSION: Pt arrives today and reports that he is doing some better with sit to stand transfers at home, still has difficulty from low surfaces.  Inititated with dynamic balance activities to improve single limb stance and change of direction. Difficulty with assuming midline in  tandem stance.  LE strengthening to improve recruitment/power with activities focus on posterior chain with use of hip hinge for deadlift to avoid excessive/repetitive knee flexion with improved performance via modified environment (weight elevated on 4" step) to aid in improved body mechanics.  Resisted walking activities to improve LE power and single limb stability with good tolerance. Continued sessions to advance POC details and improve mobility and function  OBJECTIVE IMPAIRMENTS: Abnormal gait, decreased activity tolerance, decreased balance, decreased ROM, decreased strength, and improper body mechanics.   ACTIVITY LIMITATIONS: carrying, bending, squatting, stairs, transfers, and locomotion level  PARTICIPATION LIMITATIONS: cleaning, laundry, community activity, yard work, and exercise routine  PERSONAL FACTORS: Age, Time since onset of injury/illness/exacerbation, and 1-2 comorbidities: PMH  are also affecting patient's functional outcome.   REHAB POTENTIAL: Good  CLINICAL DECISION MAKING: Stable/uncomplicated  EVALUATION COMPLEXITY: Low  PLAN:  PT FREQUENCY: 1x/week  PT DURATION: 6 weeks  PLANNED INTERVENTIONS: 97110-Therapeutic exercises, 97530- Therapeutic activity, [id number]- Neuromuscular re-education, 97535- Self Care, [id number]- Manual therapy, [phone]- Gait training, [phone]- Canalith repositioning, [phone]- Aquatic Therapy, Patient/Family education, Balance training, Stair training, Dry Needling, Joint mobilization, Spinal mobilization, DME instructions, Cryotherapy, and Moist heat  PLAN FOR NEXT SESSION: corner balance (on foam) review, sit to stand on foam if able , add resistance loops to sidestepping and for forward/backward monster walk    10:34 AM, 04/10/23 M. Shary Decamp, PT, DPT Physical Therapist- [place] Office Number: [phone]

## 2023-04-17 ENCOUNTER — Ambulatory Visit: Payer: Medicare Other

## 2023-04-17 DIAGNOSIS — R29898 Other symptoms and signs involving the musculoskeletal system: Secondary | ICD-10-CM | POA: Diagnosis not present

## 2023-04-17 DIAGNOSIS — M6281 Muscle weakness (generalized): Secondary | ICD-10-CM | POA: Diagnosis not present

## 2023-04-17 DIAGNOSIS — R2681 Unsteadiness on feet: Secondary | ICD-10-CM | POA: Diagnosis not present

## 2023-04-17 NOTE — Therapy (Signed)
OUTPATIENT PHYSICAL THERAPY NEURO TREATMENT   Patient Name: Ronald Hull MRN: [id number] DOB:[birth year], 77 y.o., male Today's Date: 04/17/2023   PCP: Renford Dills, MD REFERRING PROVIDER: Glendale Chard, DO  END OF SESSION:  PT End of Session - 04/17/23 0937     Visit Number 6    Number of Visits 7    Date for PT Re-Evaluation 04/24/23    Authorization Type BCBS Medicare    PT Start Time 0933    PT Stop Time 1015    PT Time Calculation (min) 42 min    Activity Tolerance Patient tolerated treatment well    Behavior During Therapy [facility] for tasks assessed/performed               Past Medical History:  Diagnosis Date   Arthritis    Bronchitis 05/23/2018   CAD (coronary artery disease) 02/24/2018   LHC 10/19: pLAD 50, mLAD 50 (dFFR=0.89), oLCx 95, pRCA 30, mRCA 80 >> PCI: DES to LCx; DES to RCA // [id number] Trial participant (will DC Plavix after 30 d and remain on ASA + Apixaban)   Chest pain secondary to ablation, with expected mild elevation in troponin 05/22/2018   Complication of anesthesia    BP dropped post colonoscopy   Coronary arteriosclerosis 02/24/2018   Essential hypertension 03/12/2018   Fracture of proximal phalanx of finger 10/24/2019   Hypercholesteremia    Hyperlipidemia 03/12/2018   Lung nodules 02/25/2019   Chest CT 01/2019: LUL nodule unchanged (benign); new LUL nodules 6 mm, aortic atherosclerosis, emphysema, partially calcified pleural plaques suggesting prior empyema or hemothorax   Obstructive sleep apnea    uses CPAP nightly   Pain of left hand 11/22/2019   Paroxysmal atrial fibrillation    persistent    Presence of Watchman left atrial appendage closure device 08/08/2021   Watchman 27mm with Dr. Lalla Brothers   Secondary hypercoagulable state 04/05/2019   Wears glasses    Past Surgical History:  Procedure Laterality Date   ATRIAL FIBRILLATION ABLATION N/A 05/20/2018   Procedure: ATRIAL FIBRILLATION ABLATION;  Surgeon: Regan Lemming, MD;  Location: [facility] INVASIVE CV LAB;  Service: Cardiovascular;  Laterality: N/A;   ATRIAL FIBRILLATION ABLATION N/A 09/02/2019   Procedure: ATRIAL FIBRILLATION ABLATION;  Surgeon: Regan Lemming, MD;  Location: [facility] INVASIVE CV LAB;  Service: Cardiovascular;  Laterality: N/A;   CARDIOVERSION N/A 11/01/2015   Procedure: CARDIOVERSION;  Surgeon: [name], MD;  Location: [facility] ENDOSCOPY;  Service: Cardiovascular;  Laterality: N/A;   CARDIOVERSION N/A 11/23/2015   Procedure: CARDIOVERSION;  Surgeon: Will [name], MD;  Location: [facility] ENDOSCOPY;  Service: Cardiovascular;  Laterality: N/A;   CARDIOVERSION N/A 10/12/2017   Procedure: CARDIOVERSION;  Surgeon: [name], MD;  Location: [facility] ENDOSCOPY;  Service: Cardiovascular;  Laterality: N/A;   CARDIOVERSION N/A 06/30/2018   Procedure: CARDIOVERSION;  Surgeon: Jake Bathe, MD;  Location: [facility] ENDOSCOPY;  Service: Cardiovascular;  Laterality: N/A;   CARDIOVERSION N/A 09/22/2018   Procedure: CARDIOVERSION;  Surgeon: Quintella Reichert, MD;  Location: [facility] ENDOSCOPY;  Service: Cardiovascular;  Laterality: N/A;   CARDIOVERSION N/A 07/15/2019   Procedure: CARDIOVERSION;  Surgeon: Jodelle Red, MD;  Location: [facility] ENDOSCOPY;  Service: Cardiovascular;  Laterality: N/A;   CARDIOVERSION N/A 12/07/2019   Procedure: CARDIOVERSION;  Surgeon: Chilton Si, MD;  Location: [facility] ENDOSCOPY;  Service: Cardiovascular;  Laterality: N/A;   CARDIOVERSION N/A 03/19/2020   Procedure: CARDIOVERSION;  Surgeon: Quintella Reichert, MD;  Location: [facility] ENDOSCOPY;  Service: Cardiovascular;  Laterality:  N/A;   COLONOSCOPY     CORONARY PRESSURE/FFR STUDY N/A 02/24/2018   Procedure: INTRAVASCULAR PRESSURE WIRE/FFR STUDY;  Surgeon: Tonny Bollman, MD;  Location: [facility] INVASIVE CV LAB;  Service: Cardiovascular;  Laterality: N/A;   CORONARY STENT INTERVENTION N/A 02/24/2018   Procedure: CORONARY STENT INTERVENTION;  Surgeon: Tonny Bollman, MD;  Location: [facility] INVASIVE CV  LAB;  Service: Cardiovascular;  Laterality: N/A;   I & D EXTREMITY Left 10/19/2019   Procedure: IRRIGATION AND DEBRIDEMENT LEFT SMALL PROXIMAL PHALANX;  Surgeon: Bradly Bienenstock, MD;  Location: [facility];  Service: Orthopedics;  Laterality: Left;   KNEE ARTHROSCOPY  2000   LEFT ATRIAL APPENDAGE OCCLUSION N/A 08/08/2021   Procedure: LEFT ATRIAL APPENDAGE OCCLUSION;  Surgeon: Lanier Prude, MD;  Location: [facility] INVASIVE CV LAB;  Service: Cardiovascular;  Laterality: N/A;   LEFT HEART CATH AND CORONARY ANGIOGRAPHY N/A 02/24/2018   Procedure: LEFT HEART CATH AND CORONARY ANGIOGRAPHY;  Surgeon: Tonny Bollman, MD;  Location: [facility] INVASIVE CV LAB;  Service: Cardiovascular;  Laterality: N/A;   OPEN REDUCTION INTERNAL FIXATION (ORIF) PROXIMAL PHALANX Left 10/19/2019   Procedure: OPEN REDUCTION INTERNAL FIXATION (ORIF) PROXIMAL PHALANX WITH K-WIRES LEFT SMALL;  Surgeon: Bradly Bienenstock, MD;  Location: [facility];  Service: Orthopedics;  Laterality: Left;   SHOULDER ARTHROSCOPY WITH ROTATOR CUFF REPAIR AND SUBACROMIAL DECOMPRESSION Right 07/15/2012   Procedure: RIGHT ARTHROSCOPY SHOULDER DECOMPRESSION  SUBACROMIAL PARTIAL ACROMIOPLASTY WITH CORACOACROMIAL RELEASE,  DISTAL CLAVICULECTOMY, resect biceps debride labrium WITH ROTATOR CUFF REPAIR;  Surgeon: [name], MD;  Location: [facility];  Service: Orthopedics;  Laterality: Right;   TEE WITHOUT CARDIOVERSION N/A 08/08/2021   Procedure: TRANSESOPHAGEAL ECHOCARDIOGRAM (TEE);  Surgeon: Lanier Prude, MD;  Location: [facility] INVASIVE CV LAB;  Service: Cardiovascular;  Laterality: N/A;   Patient Active Problem List   Diagnosis Date Noted   Obstructive sleep apnea 02/17/2022   Presence of Watchman left atrial appendage closure device 08/08/2021   Pain of left hand 11/22/2019   Secondary hypercoagulable state 04/05/2019   Lung nodules 02/25/2019   Paroxysmal atrial fibrillation    Hyperlipidemia 03/12/2018    Essential hypertension 03/12/2018   CAD (coronary artery disease) 02/24/2018   Coronary arteriosclerosis 02/24/2018    ONSET DATE: "couple of years"  REFERRING DIAG: R26.81 (ICD-10-CM) - Unsteady gait G62.9 (ICD-10-CM) - Neuropathy R29.898 (ICD-10-CM) - Leg weakness, bilateral  THERAPY DIAG:  Unsteadiness on feet  Muscle weakness (generalized)  Other symptoms and signs involving the musculoskeletal system  Rationale for Evaluation and Treatment: Rehabilitation  SUBJECTIVE:  SUBJECTIVE STATEMENT: Got a 20# kettlebell for lifting.   Pt accompanied by: self  PERTINENT HISTORY: CAD, atrial fibrllation, and hypertension returning to the clinic for follow-up of neuropathy affecting the feet and bilateral hand paresthesias.he began having sensation that his feet are wrapped in gauze or walking on sand. Symptoms became more bothersome since the summer of 2022. He denies burning or tingling. It involves the soles and balls of the feet. Symptoms are noticeable when he is resting or in bed. Balance is getting worse for sometime. He has fallen several times this year, one with injury. He does not use a cane and has not done PT.   PAIN:  Are you having pain? No  PRECAUTIONS: None  RED FLAGS: None   WEIGHT BEARING RESTRICTIONS: No  FALLS: Has patient fallen in last 6 months?  Notes near falls over past 6 months  LIVING ENVIRONMENT: Lives with: lives with their spouse Lives in: House/apartment Stairs:  flight of stairs, requires step-to pattern if no rails, reciprocal with rails Has following equipment at home: None  PLOF: Independent He lives at home with his wife in a 2-level home. He works as a Estate manager/land agent. He walks unassisted and continues to walk 2 miles per day (over course of the day,  not all at one)  PATIENT GOALS: improve balance and power in LE  OBJECTIVE:   TODAY'S TREATMENT: 04/17/23 Activity Comments  Dynamic balance warm-up Retro-walk 4x25 ft Sidestep 4x25 ft Tandem walk 2x25 ft  Deadlift hip hinge 15# 3x10 15# KB elevated on 6" step  Bicep curls 2x10 5# Foot elevated on 6" step   Sit to stand 2x5 24" seat -8# goblet -8# goblet + stair taps  Standing on foam -EO/EC x 30 sec. Head turns EO/EC -alt stair taps -foot on step eyes closed 3x10 sec  Push-release  All directions. 2-3 steps for anterior-posterio recovery      TODAY'S TREATMENT: 04/10/23 Activity Comments  Dynamic balance warm-up Retro-walk 4x25 ft Sidestep 4x25 ft Tandem walk 2x25 ft  Sit to stand 1x10, 10# 24" seat height 22" seat height  Suitcase march 4x25 ft 15#  Deadlift hip hinge 1x10 15#, 20#, 4" step to limit travel, cues for hip hinge, improved carryover  LAQ 3x10 5#  Resisted walking x 2 min Backwards-forwards Forwards-backwards 20#  Gastroc stretch 2x60 sec On slantboard     Verbal and demo review of HEP  TODAY'S TREATMENT: 04/01/2023 Activity Comments  STS from 24" mat, 5 reps Cues for eccentric control  LAQ 4#, 2 x 10 Cues for slowed control  Seated march, 10 reps, 4#   Standing minisquats 2 x 10   HR 71 bpm, O2 sats 97%   Standing minisquat, 3 x 5 reps Pain in R knee   Seated hamstring stretch, 3 x 30 sec, foot propped on 4" block   Corner balance: EC 30 sec, then EC + head turns/head nods  Partial heel/toe EO/EC 30 sec Increased unsteadiness EC with heel/toe position  Standing on incline/decline with head turns/head nods EO then EC x 30 sec Min sway  NuStep Level 5>6, 4 extremities x 6 minutes    HOME EXERCISE PROGRAM: Access Code: [email or web address] URL: [email or web address] Date: 04/01/2023 Prepared by: [facility] Out[facility]  Re[facility] - [facility]  Exercises - Sit to Stand with Arms Crossed  - 1 x daily - 7 x weekly - 5 sets - 5 reps - Corner  Balance Feet Together With Eyes Closed  - 1 x daily -  7 x weekly - 3 sets - 30 sec hold - Corner Balance Feet Together: Eyes Open With Head Turns  - 1 x daily - 7 x weekly - 3 sets - 3 reps - Corner Balance Feet Together: Eyes Closed With Head Turns  - 1 x daily - 7 x weekly - 3 sets - 3 reps - Semi-Tandem Corner Balance With Eyes Open  - 1 x daily - 7 x weekly - 1-3 sets - 15 sec hold - Side Stepping with Counter Support  - 1 x daily - 7 x weekly - 1-2 sets - 1-2 minutes hold - Backward Walking with Counter Support  - 1 x daily - 7 x weekly - 1-2 sets - 1-2 min hold - Tandem Walking with Counter Support  - 1 x daily - 7 x weekly - 1-2 sets - 1-2 min hold - Seated Long Arc Quad  - 1 x daily - 5 x weekly - 2 sets - 10 reps - Supine Active Straight Leg Raise  - 1 x daily - 5 x weekly - 2 sets - 10 reps - Seated Hamstring Stretch  - 1-2 x daily - 7 x weekly - 1 sets - 3 reps - 30 sec hold - Kettlebell Deadlift  - 1 x daily - 7 x weekly - 3 sets - 10 reps - Kettlebell Suitcase Carry  - 1 x daily - 7 x weekly - 3 sets - 10 reps      PATIENT EDUCATION: Education details: HEP update Person educated: Patient Education method: Explanation, Demonstration, Tactile cues, Verbal cues, and Handouts Education comprehension: verbalized understanding and returned demonstration     Note: Objective measures were completed at Evaluation unless otherwise noted.  DIAGNOSTIC FINDINGS: NCS/EMG of the legs performed by Dr. Neale Burly shows chronic sensorimotor axonal polyneuropathy.  COGNITION: Overall cognitive status: Within functional limits for tasks assessed   SENSATION: Not tested, known hx of BLE neuropathy  COORDINATION: WNL  EDEMA:  None noted  MUSCLE TONE: WNL     POSTURE: No Significant postural limitations  LOWER EXTREMITY ROM:     Active  Right Eval Left Eval  Hip flexion 100 100  Hip extension    Hip abduction    Hip adduction    Hip internal rotation    Hip external  rotation    Knee flexion 95-100 95-100  Knee extension 0 0  Ankle dorsiflexion 15 15  Ankle plantarflexion    Ankle inversion    Ankle eversion     (Blank rows = not tested)  LOWER EXTREMITY MMT:   5/5 gross strength to manual muscle test  BED MOBILITY:  NT  TRANSFERS: Assistive device utilized: None  Sit to stand: Modified independence Stand to sit: Modified independence Chair to chair: Modified independence Floor:  NT      STAIRS: NT  GAIT: Gait pattern: wide BOS Distance walked:  Assistive device utilized: None Level of assistance: Complete Independence Comments:   FUNCTIONAL TESTS:  5 times sit to stand: 1 rep w/ lack of eccentric control Timed up and go (TUG): 12  sec. No AD Berg Balance Scale: 49/56  M-CTSIB  Condition 1: Firm Surface, EO 30 Sec, Mild Sway  Condition 2: Firm Surface, EC 30 Sec, Mild and Moderate Sway  Condition 3: Foam Surface, EO 30 Sec, Mild Sway  Condition 4: Foam Surface, EC 30 Sec, Moderate Sway      TODAY'S TREATMENT:  DATE: 03/13/23      GOALS: Goals reviewed with patient? Yes  SHORT TERM GOALS: Target date: 04/03/2023    Patient will be independent in HEP to improve functional outcomes Baseline: Goal status: MET, 04/01/2023    LONG TERM GOALS: Target date: 04/24/2023    Demo improved BLE strength as evidenced by ability to perform 5xSTS test in 25 sec Baseline: 1 rep, requires use of hands, no eccentric control in last few degrees of lowering Goal status: IN PROGRESS  2.  Demo improved postural stability per mild sway x 30 sec condition 4 M-CTSIB to improve safety with ADL Baseline: moderate x 30 sec Goal status: IN PROGRESS  3.  Demo improved balance and stability with single limb support as evidenced by increase score of 52/56 Berg Balance Test to reduce risk for falls Baseline:  49/56 Goal status: IN PROGRESS  4.  Report improved functional performance as evidenced by ability to arise from couch without struggle Baseline: unable Goal status: IN PROGRESS    ASSESSMENT:  CLINICAL IMPRESSION: Continued with dynamic and static balance training to facilitate righting reactions and instructed in combination activities of combining strength training and balance training for efficiency and to improve stability under multi-sensory conditions and single limb support.  Tolerating sessions very well and reports improved ability for arising from chair heights.  Push-release testing reveals present but delayed righting reactions anterior/posterior requiring 2-3 small amplitude steps to regain balance vs single large displacement for laterally.  Continued to next session for D/C assessment and summary  OBJECTIVE IMPAIRMENTS: Abnormal gait, decreased activity tolerance, decreased balance, decreased ROM, decreased strength, and improper body mechanics.   ACTIVITY LIMITATIONS: carrying, bending, squatting, stairs, transfers, and locomotion level  PARTICIPATION LIMITATIONS: cleaning, laundry, community activity, yard work, and exercise routine  PERSONAL FACTORS: Age, Time since onset of injury/illness/exacerbation, and 1-2 comorbidities: PMH  are also affecting patient's functional outcome.   REHAB POTENTIAL: Good  CLINICAL DECISION MAKING: Stable/uncomplicated  EVALUATION COMPLEXITY: Low  PLAN:  PT FREQUENCY: 1x/week  PT DURATION: 6 weeks  PLANNED INTERVENTIONS: 97110-Therapeutic exercises, 97530- Therapeutic activity, [id number]- Neuromuscular re-education, 97535- Self Care, [id number]- Manual therapy, [phone]- Gait training, [phone]- Canalith repositioning, [phone]- Aquatic Therapy, Patient/Family education, Balance training, Stair training, Dry Needling, Joint mobilization, Spinal mobilization, DME instructions, Cryotherapy, and Moist heat  PLAN FOR NEXT SESSION: D/C assessment and  summary    9:37 AM, 04/17/23 M. Shary Decamp, PT, DPT Physical Therapist- [place] Office Number: [phone]

## 2023-04-24 ENCOUNTER — Ambulatory Visit: Payer: Medicare Other

## 2023-04-24 DIAGNOSIS — M6281 Muscle weakness (generalized): Secondary | ICD-10-CM | POA: Diagnosis not present

## 2023-04-24 DIAGNOSIS — R29898 Other symptoms and signs involving the musculoskeletal system: Secondary | ICD-10-CM | POA: Diagnosis not present

## 2023-04-24 DIAGNOSIS — R2681 Unsteadiness on feet: Secondary | ICD-10-CM

## 2023-04-24 NOTE — Therapy (Signed)
OUTPATIENT PHYSICAL THERAPY NEURO TREATMENT and D/C Summary   Patient Name: Ronald Hull MRN: [id number] DOB:04-08-1946, 77 y.o., male Today's Date: 04/24/2023   PCP: Renford Dills, MD REFERRING PROVIDER: Glendale Chard, DO PHYSICAL THERAPY DISCHARGE SUMMARY  Visits from Start of Care: 7  Current functional level related to goals / functional outcomes: See below for outcome measures   Remaining deficits: Difficulty with squatting such as to pick up items from ground   Education / Equipment: HEP   Patient agrees to discharge. Patient goals were met. Patient is being discharged due to meeting the stated rehab goals.  END OF SESSION:  PT End of Session - 04/24/23 0939     Visit Number 7    Number of Visits 7    Date for PT Re-Evaluation 04/24/23    Authorization Type BCBS Medicare    PT Start Time 0935    PT Stop Time 1015    PT Time Calculation (min) 40 min    Activity Tolerance Patient tolerated treatment well    Behavior During Therapy WFL for tasks assessed/performed               Past Medical History:  Diagnosis Date   Arthritis    Bronchitis 05/23/2018   CAD (coronary artery disease) 02/24/2018   LHC 10/19: pLAD 50, mLAD 50 (dFFR=0.89), oLCx 95, pRCA 30, mRCA 80 >> PCI: DES to LCx; DES to RCA // [id number] Trial participant (will DC Plavix after 30 d and remain on ASA + Apixaban)   Chest pain secondary to ablation, with expected mild elevation in troponin 05/22/2018   Complication of anesthesia    BP dropped post colonoscopy   Coronary arteriosclerosis 02/24/2018   Essential hypertension 03/12/2018   Fracture of proximal phalanx of finger 10/24/2019   Hypercholesteremia    Hyperlipidemia 03/12/2018   Lung nodules 02/25/2019   Chest CT 01/2019: LUL nodule unchanged (benign); new LUL nodules 6 mm, aortic atherosclerosis, emphysema, partially calcified pleural plaques suggesting prior empyema or hemothorax   Obstructive sleep apnea    uses CPAP  nightly   Pain of left hand 11/22/2019   Paroxysmal atrial fibrillation    persistent    Presence of Watchman left atrial appendage closure device 08/08/2021   Watchman 27mm with Dr. Lalla Brothers   Secondary hypercoagulable state 04/05/2019   Wears glasses    Past Surgical History:  Procedure Laterality Date   ATRIAL FIBRILLATION ABLATION N/A 05/20/2018   Procedure: ATRIAL FIBRILLATION ABLATION;  Surgeon: Regan Lemming, MD;  Location: [facility] INVASIVE CV LAB;  Service: Cardiovascular;  Laterality: N/A;   ATRIAL FIBRILLATION ABLATION N/A 09/02/2019   Procedure: ATRIAL FIBRILLATION ABLATION;  Surgeon: Regan Lemming, MD;  Location: [facility]ASIVE CV [facility];  Service: Cardiovascular;  Laterality: N/A;   CARDIOVERSION N/A 11/01/2015   Procedure: CARDIOVERSION;  Surgeon: [name], MD;  Location: [facility] ENDOSCOPY;  Service: Cardiovascular;  Laterality: N/A;   CARDIOVERSION N/A 11/23/2015   Procedure: CARDIOVERSION;  Surgeon: Will [name], MD;  Location: [facility] ENDOSCOPY;  Service: Cardiovascular;  Laterality: N/A;   CARDIOVERSION N/A 10/12/2017   Procedure: CARDIOVERSION;  Surgeon: [name], MD;  Location: [facility] ENDOSCOPY;  Service: Cardiovascular;  Laterality: N/A;   CARDIOVERSION N/A 06/30/2018   Procedure: CARDIOVERSION;  Surgeon: Jake Bathe, MD;  Location: [facility] ENDOSCOPY;  Service: Cardiovascular;  Laterality: N/A;   CARDIOVERSION N/A 09/22/2018   Procedure: CARDIOVERSION;  Surgeon: Quintella Reichert, MD;  Location: [facility] ENDOSCOPY;  Service: Cardiovascular;  Laterality: N/A;  CARDIOVERSION N/A 07/15/2019   Procedure: CARDIOVERSION;  Surgeon: Jodelle Red, MD;  Location: [facility] ENDOSCOPY;  Service: Cardiovascular;  Laterality: N/A;   CARDIOVERSION N/A 12/07/2019   Procedure: CARDIOVERSION;  Surgeon: Chilton Si, MD;  Location: [facility] ENDOSCOPY;  Service: Cardiovascular;  Laterality: N/A;   CARDIOVERSION N/A 03/19/2020   Procedure: CARDIOVERSION;  Surgeon: Quintella Reichert, MD;  Location:  [facility] ENDOSCOPY;  Service: Cardiovascular;  Laterality: N/A;   COLONOSCOPY     CORONARY PRESSURE/FFR STUDY N/A 02/24/2018   Procedure: INTRAVASCULAR PRESSURE WIRE/FFR STUDY;  Surgeon: Tonny Bollman, MD;  Location: [facility] INVASIVE CV LAB;  Service: Cardiovascular;  Laterality: N/A;   CORONARY STENT INTERVENTION N/A 02/24/2018   Procedure: CORONARY STENT INTERVENTION;  Surgeon: Tonny Bollman, MD;  Location: [facility] INVASIVE CV LAB;  Service: Cardiovascular;  Laterality: N/A;   I & D EXTREMITY Left 10/19/2019   Procedure: IRRIGATION AND DEBRIDEMENT LEFT SMALL PROXIMAL PHALANX;  Surgeon: Bradly Bienenstock, MD;  Location: [facility];  Service: Orthopedics;  Laterality: Left;   KNEE ARTHROSCOPY  2000   LEFT ATRIAL APPENDAGE OCCLUSION N/A 08/08/2021   Procedure: LEFT ATRIAL APPENDAGE OCCLUSION;  Surgeon: Lanier Prude, MD;  Location: [facility];  Service: Cardiovascular;  Laterality: N/A;   LEFT HEART CATH AND CORONARY ANGIOGRAPHY N/A 02/24/2018   Procedure: LEFT HEART CATH AND CORONARY ANGIOGRAPHY;  Surgeon: Tonny Bollman, MD;  Location: [facility] INVASIVE CV LAB;  Service: Cardiovascular;  Laterality: N/A;   OPEN REDUCTION INTERNAL FIXATION (ORIF) PROXIMAL PHALANX Left 10/19/2019   Procedure: OPEN REDUCTION INTERNAL FIXATION (ORIF) PROXIMAL PHALANX WITH K-WIRES LEFT SMALL;  Surgeon: Bradly Bienenstock, MD;  Location: [facility];  Service: Orthopedics;  Laterality: Left;   SHOULDER ARTHROSCOPY WITH ROTATOR CUFF REPAIR AND SUBACROMIAL DECOMPRESSION Right 07/15/2012   Procedure: RIGHT ARTHROSCOPY SHOULDER DECOMPRESSION  SUBACROMIAL PARTIAL ACROMIOPLASTY WITH CORACOACROMIAL RELEASE,  DISTAL CLAVICULECTOMY, resect biceps debride labrium WITH ROTATOR CUFF REPAIR;  Surgeon: [name], MD;  Location: [facility];  Service: Orthopedics;  Laterality: Right;   TEE WITHOUT CARDIOVERSION N/A 08/08/2021   Procedure: TRANSESOPHAGEAL ECHOCARDIOGRAM (TEE);  Surgeon: Lanier Prude, MD;  Location: [facility] INVASIVE CV LAB;  Service: Cardiovascular;  Laterality: N/A;   Patient Active Problem List   Diagnosis Date Noted   Obstructive sleep apnea 02/17/2022   Presence of Watchman left atrial appendage closure device 08/08/2021   Pain of left hand 11/22/2019   Secondary hypercoagulable state 04/05/2019   Lung nodules 02/25/2019   Paroxysmal atrial fibrillation    Hyperlipidemia 03/12/2018   Essential hypertension 03/12/2018   CAD (coronary artery disease) 02/24/2018   Coronary arteriosclerosis 02/24/2018    ONSET DATE: "couple of years"  REFERRING DIAG: R26.81 (ICD-10-CM) - Unsteady gait G62.9 (ICD-10-CM) - Neuropathy R29.898 (ICD-10-CM) - Leg weakness, bilateral  THERAPY DIAG:  Unsteadiness on feet  Muscle weakness (generalized)  Other symptoms and signs involving the musculoskeletal system  Rationale for Evaluation and Treatment: Rehabilitation  SUBJECTIVE:  SUBJECTIVE STATEMENT: Noting overall improved performance. Easier time getting up from couch  Pt accompanied by: self  PERTINENT HISTORY: CAD, atrial fibrllation, and hypertension returning to the clinic for follow-up of neuropathy affecting the feet and bilateral hand paresthesias.he began having sensation that his feet are wrapped in gauze or walking on sand. Symptoms became more bothersome since the summer of 2022. He denies burning or tingling. It involves the soles and balls of the feet. Symptoms are noticeable when he is resting or in bed. Balance is getting worse for sometime. He has fallen several times this year, one with injury. He does not use a cane and has not done PT.   PAIN:  Are you having pain? No  PRECAUTIONS: None  RED FLAGS: None   WEIGHT BEARING RESTRICTIONS: No  FALLS: Has patient fallen in  last 6 months?  Notes near falls over past 6 months  LIVING ENVIRONMENT: Lives with: lives with their spouse Lives in: House/apartment Stairs:  flight of stairs, requires step-to pattern if no rails, reciprocal with rails Has following equipment at home: None  PLOF: Independent He lives at home with his wife in a 2-level home. He works as a Estate manager/land agent. He walks unassisted and continues to walk 2 miles per day (over course of the day, not all at one)  PATIENT GOALS: improve balance and power in LE  OBJECTIVE:   TODAY'S TREATMENT: 04/24/23 Activity Comments  5xSTS 18 sec   M-CTSIB  Condition 1-2 normal/mild Condition 3-4: normal/mild  Berg balance Test 53/56  Deadlift with kettebell + blue loop to bell   Heel slides         TODAY'S TREATMENT: 04/17/23 Activity Comments  Dynamic balance warm-up Retro-walk 4x25 ft Sidestep 4x25 ft Tandem walk 2x25 ft  Deadlift hip hinge 15# 3x10 15# KB elevated on 6" step  Bicep curls 2x10 5# Foot elevated on 6" step   Sit to stand 2x5 24" seat -8# goblet -8# goblet + stair taps  Standing on foam -EO/EC x 30 sec. Head turns EO/EC -alt stair taps -foot on step eyes closed 3x10 sec  Push-release  All directions. 2-3 steps for anterior-posterio recovery         HOME EXERCISE PROGRAM: Access Code: [email or web address] URL: [email or web address] Date: 04/01/2023 Prepared by: [facility] Outpatient  Rehab - [facility]  Exercises - Sit to Stand with Arms Crossed  - 1 x daily - 7 x weekly - 5 sets - 5 reps - Corner Balance Feet Together With Eyes Closed  - 1 x daily - 7 x weekly - 3 sets - 30 sec hold - Corner Balance Feet Together: Eyes Open With Head Turns  - 1 x daily - 7 x weekly - 3 sets - 3 reps - Corner Balance Feet Together: Eyes Closed With Head Turns  - 1 x daily - 7 x weekly - 3 sets - 3 reps - Semi-Tandem Corner Balance With Eyes Open  - 1 x daily - 7 x weekly - 1-3 sets - 15 sec hold - Side Stepping with  Counter Support  - 1 x daily - 7 x weekly - 1-2 sets - 1-2 minutes hold - Backward Walking with Counter Support  - 1 x daily - 7 x weekly - 1-2 sets - 1-2 min hold - Tandem Walking with Counter Support  - 1 x daily - 7 x weekly - 1-2 sets - 1-2 min hold - Seated Long Arc Quad  - 1 x daily -  5 x weekly - 2 sets - 10 reps - Supine Active Straight Leg Raise  - 1 x daily - 5 x weekly - 2 sets - 10 reps - Seated Hamstring Stretch  - 1-2 x daily - 7 x weekly - 1 sets - 3 reps - 30 sec hold - Kettlebell Deadlift  - 1 x daily - 7 x weekly - 3 sets - 10 reps - Kettlebell Suitcase Carry  - 1 x daily - 7 x weekly - 3 sets - 10 reps      PATIENT EDUCATION: Education details: HEP update Person educated: Patient Education method: Explanation, Demonstration, Tactile cues, Verbal cues, and Handouts Education comprehension: verbalized understanding and returned demonstration     Note: Objective measures were completed at Evaluation unless otherwise noted.  DIAGNOSTIC FINDINGS: NCS/EMG of the legs performed by Dr. Neale Burly shows chronic sensorimotor axonal polyneuropathy.  COGNITION: Overall cognitive status: Within functional limits for tasks assessed   SENSATION: Not tested, known hx of BLE neuropathy  COORDINATION: WNL  EDEMA:  None noted  MUSCLE TONE: WNL     POSTURE: No Significant postural limitations  LOWER EXTREMITY ROM:     Active  Right Eval Left Eval  Hip flexion 100 100  Hip extension    Hip abduction    Hip adduction    Hip internal rotation    Hip external rotation    Knee flexion 95-100 95-100  Knee extension 0 0  Ankle dorsiflexion 15 15  Ankle plantarflexion    Ankle inversion    Ankle eversion     (Blank rows = not tested)  LOWER EXTREMITY MMT:   5/5 gross strength to manual muscle test  BED MOBILITY:  NT  TRANSFERS: Assistive device utilized: None  Sit to stand: Modified independence Stand to sit: Modified independence Chair to chair: Modified  independence Floor:  NT      STAIRS: NT  GAIT: Gait pattern: wide BOS Distance walked:  Assistive device utilized: None Level of assistance: Complete Independence Comments:   FUNCTIONAL TESTS:  5 times sit to stand: 1 rep w/ lack of eccentric control Timed up and go (TUG): 12  sec. No AD Berg Balance Scale: 49/56  M-CTSIB  Condition 1: Firm Surface, EO 30 Sec, Mild Sway  Condition 2: Firm Surface, EC 30 Sec, Mild and Moderate Sway  Condition 3: Foam Surface, EO 30 Sec, Mild Sway  Condition 4: Foam Surface, EC 30 Sec, Moderate Sway      TODAY'S TREATMENT:                                                                                                                              DATE: 03/13/23      GOALS: Goals reviewed with patient? Yes  SHORT TERM GOALS: Target date: 04/03/2023    Patient will be independent in HEP to improve functional outcomes Baseline: Goal status: MET, 04/01/2023    LONG TERM GOALS: Target date: 04/24/2023  Demo improved BLE strength as evidenced by ability to perform 5xSTS test in 25 sec Baseline: 1 rep; (12/27/240 18 sec Goal status: GOAL MET  2.  Demo improved postural stability per mild sway x 30 sec condition 4 M-CTSIB to improve safety with ADL Baseline: moderate x 30 sec; normal/mild x 30 sec Goal status: MET  3.  Demo improved balance and stability with single limb support as evidenced by increase score of 52/56 Berg Balance Test to reduce risk for falls Baseline: 49/56; 53/56 Goal status: MET  4.  Report improved functional performance as evidenced by ability to arise from couch without struggle Baseline: unable Goal status: MET    ASSESSMENT:  CLINICAL IMPRESSION: Review of POC details and tests/measures with improved performance for 5xSTS from initial 1 repetition to completion in 18 sec since outset. Berg Balance Test improved from 49/56 to 53/56 and improved postural stability evident by M-CTSIB conditions  normal/mild sway.  Excellent HEP recall with relevant additions to progress strength and dynamic balance.  No further questions at this time and will D/C to HEP for continued maintenance.   OBJECTIVE IMPAIRMENTS: Abnormal gait, decreased activity tolerance, decreased balance, decreased ROM, decreased strength, and improper body mechanics.   ACTIVITY LIMITATIONS: carrying, bending, squatting, stairs, transfers, and locomotion level  PARTICIPATION LIMITATIONS: cleaning, laundry, community activity, yard work, and exercise routine  PERSONAL FACTORS: Age, Time since onset of injury/illness/exacerbation, and 1-2 comorbidities: PMH  are also affecting patient's functional outcome.   REHAB POTENTIAL: Good  CLINICAL DECISION MAKING: Stable/uncomplicated  EVALUATION COMPLEXITY: Low  PLAN:  PT FREQUENCY: 1x/week  PT DURATION: 6 weeks  PLANNED INTERVENTIONS: 97110-Therapeutic exercises, 97530- Therapeutic activity, [id number]- Neuromuscular re-education, 97535- Self Care, 29528- Manual therapy, [phone]- Gait training, [phone]- Canalith repositioning, [phone]- Aquatic Therapy, Patient/Family education, Balance training, Stair training, Dry Needling, Joint mobilization, Spinal mobilization, DME instructions, Cryotherapy, and Moist heat  PLAN FOR NEXT SESSION: D/C assessment and summary    9:39 AM, 04/24/23 M. Shary Decamp, PT, DPT Physical Therapist-  Office Number: [phone]

## 2023-08-13 NOTE — Progress Notes (Signed)
 Cardiology Office Note:  .   Date:  08/13/2023  ID:  Ronald Hull, DOB 1945/07/06, MRN [id number] PCP: Merl Star, MD  [facility] Providers Cardiologist:  Arnoldo Lapping, MD Electrophysiologist:  Lei Pump, MD {  History of Present Illness: .   Ronald Hull is a 78 y.o. male w/PMHx of  HTN, HLD, CAD (PCI to RCA/Cx 2019),  OSA w/CPAP,  AFib    Saw Dr. Lawana Pray 08/07/22, doing well, , some fatigue towards mid-late day with reports of HRs in the 40's, Toprol  dose reduced  I saw him 02/03/23 He is doing well HRs via his watch with sleep 40's, days 50's Daytime sluggishness Reports good CPAP compliance No CP, palpitations Thinks it has been over a year since he has had AFib No near syncope or syncope, maybe some weak spells he attributes to slow HRs No SOB Amiodarone  dose reduced for bradycardia  Today's visit is scheduled as a 6 mo visit ROS:   He is doing well Both his resting and exertional HRs have improved by 10bpm or so and he feels better. Exercising most days, walks as well as some weights, sometimes even gets to jogging,, but his knee is a limiter. No CP, SOB, DOE No near syncope or syncope feels AFib perhaps 1-2x a year, generally about an hour duration, never more then a day  Arrhythmia/AAD hx Afib diagnosed 2017 Amiodarone  > Multaq  > Back on amiodarone  PVI ablation 05/20/2018 PVI ablation 09/12/19 Watchman 08/08/21 (2/2 bruising, falls preference to be off OAC)  Studies Reviewed: Aaron Aas    EKG done today and reviewed by myself SR 63bpm, borderline 1st degree AVblock   08/08/21: TEE 1. Chicken wing appendage with no thrombus 27 mm FLX Watchman device  deployed Negative tug test. No leak by color flow Average compression  18.8%.   2. Left ventricular ejection fraction, by estimation, is 55 to 60%. The  left ventricle has normal function.   3. Right ventricular systolic function is normal. The right ventricular  size is normal.    4. Left atrial size was moderately dilated. No left atrial/left atrial  appendage thrombus was detected.   5. The mitral valve is abnormal. Mild mitral valve regurgitation.   6. The aortic valve is tricuspid. There is mild calcification of the  aortic valve. There is mild thickening of the aortic valve. Aortic valve  regurgitation is mild. Mild aortic valve stenosis.   7. PFO prior to trans septal Left to right shunt through trans septal  puncture.   09/02/19: EPS/ablation CONCLUSIONS: 1. Atrial fibrillation upon presentation.   2. Successful electrical isolation and anatomical encircling of all four pulmonary veins with radiofrequency current.  A WACA approach was used 3. Additional left atrial ablation was performed with a standard box lesion created along the posterior wall of the left atrium 4. Atrial fibrillation successfully cardioverted to sinus rhythm. 5. No early apparent complications.   05/20/2018: EPS/ablation CONCLUSIONS: 1. Atrial fibrillation upon presentation.   2. Successful electrical isolation and anatomical encircling of all four pulmonary veins with radiofrequency current.  A WACA approach was used 3. Additional left atrial ablation was performed with a standard box lesion created along the posterior wall of the left atrium 4. Atrial fibrillation successfully cardioverted to sinus rhythm. 5. No early apparent complications.   Risk Assessment/Calculations:    Physical Exam:   VS:  There were no vitals taken for this visit.   Wt Readings from Last 3 Encounters:  03/02/23 282  lb 6.4 oz (128.1 kg)  02/03/23 280 lb (127 kg)  08/07/22 280 lb (127 kg)    GEN: Well nourished, well developed in no acute distress NECK: No JVD; No carotid bruits CARDIAC: RRR, no murmurs, rubs, gallops RESPIRATORY: CTA b/l without rales, wheezing or rhonchi  ABDOMEN: Soft, non-tender, non-distended EXTREMITIES: No edema; No deformity    ASSESSMENT AND PLAN: .    persistent  AFib Off OAC s/p LAAO Chronic low dose amiodarone  Very low burden by symptoms Labs today  CAD No symptoms of angina On ASA, BB, statin Over due to see Dr. Marjean Sierras  HTN Looks good  Secondary hypercoagulable state 2/2 AFib       Dispo: will have him re-establish with Dr. Marjean Sierras in the next 4 mo, can see EP in a year otherwise, sooner if needed    Signed, Debbie Fails, PA-C

## 2023-08-17 ENCOUNTER — Encounter: Payer: Self-pay | Admitting: Physician Assistant

## 2023-08-17 ENCOUNTER — Ambulatory Visit: Attending: Physician Assistant | Admitting: Physician Assistant

## 2023-08-17 VITALS — BP 130/72 | HR 63 | Ht 70.0 in | Wt 249.6 lb

## 2023-08-17 DIAGNOSIS — I4819 Other persistent atrial fibrillation: Secondary | ICD-10-CM

## 2023-08-17 DIAGNOSIS — I10 Essential (primary) hypertension: Secondary | ICD-10-CM

## 2023-08-17 DIAGNOSIS — I2510 Atherosclerotic heart disease of native coronary artery without angina pectoris: Secondary | ICD-10-CM | POA: Diagnosis not present

## 2023-08-17 DIAGNOSIS — Z79899 Other long term (current) drug therapy: Secondary | ICD-10-CM | POA: Diagnosis not present

## 2023-08-17 DIAGNOSIS — D6869 Other thrombophilia: Secondary | ICD-10-CM | POA: Diagnosis not present

## 2023-08-17 NOTE — Patient Instructions (Addendum)
 Medication Instructions:   Your physician recommends that you continue on your current medications as directed. Please refer to the Current Medication list given to you today.  *If you need a refill on your cardiac medications before your next appointment, please call your pharmacy*   Lab Work:  PLEASE GO DOWN STAIRS  [facility]  FIRST FLOOR  [address] ( GET OFF ELEVATORS MAKE A LEFT AND ANOTHER LEFT LAB ON RIGHT DOWN HALLWAY :  CMET AND TSH TODAY     If you have labs (blood work) drawn today and your tests are completely normal, you will receive your results only by: MyChart Message (if you have MyChart) OR A paper copy in the mail If you have any lab test that is abnormal or we need to change your treatment, we will call you to review the results.   Testing/Procedures: NONE ORDERED  TODAY    Follow-Up: At [facility], you and your health needs are our priority.  As part of our continuing mission to provide you with exceptional heart care, our providers are all part of one team.  This team includes your primary Cardiologist (physician) and Advanced Practice Providers or APPs (Physician Assistants and Nurse Practitioners) who all work together to provide you with the care you need, when you need it.  Your next appointment:  DR Arlester Ladd / APP 3-4 MONTHS   1 year(s)  Provider:    You may see Will Cortland Ding, MD or one of the following Advanced Practice Providers on your designated Care Team:   Mertha Abrahams, [place]    We recommend signing up for the patient portal called "MyChart".  Sign up information is provided on this After Visit Summary.  MyChart is used to connect with patients for Virtual Visits (Telemedicine).  Patients are able to view lab/test results, encounter notes, upcoming appointments, etc.  Non-urgent messages can be sent to your provider as well.   To learn more about what you can do with MyChart, go to [email or web address].   Other Instructions        1st Floor: - Lobby - Registration  - Pharmacy  - Lab - Cafe  2nd Floor: - PV Lab - Diagnostic Testing (echo, CT, nuclear med)  3rd Floor: - Vacant  4th Floor: - TCTS (cardiothoracic surgery) - AFib Clinic - Structural Heart Clinic - Vascular Surgery  - Vascular Ultrasound  5th Floor: - HeartCare Cardiology (general and EP) - Clinical Pharmacy for coumadin, hypertension, lipid, weight-loss medications, and med management appointments    Valet parking services will be available as well.

## 2023-08-18 LAB — COMPREHENSIVE METABOLIC PANEL WITH GFR
ALT: 24 IU/L (ref 0–44)
AST: 23 IU/L (ref 0–40)
Albumin: 4 g/dL (ref 3.8–4.8)
Alkaline Phosphatase: 109 IU/L (ref 44–121)
BUN/Creatinine Ratio: 10 (ref 10–24)
BUN: 11 mg/dL (ref 8–27)
Bilirubin Total: 0.4 mg/dL (ref 0.0–1.2)
CO2: 23 mmol/L (ref 20–29)
Calcium: 9 mg/dL (ref 8.6–10.2)
Chloride: 97 mmol/L (ref 96–106)
Creatinine, Ser: 1.07 mg/dL (ref 0.76–1.27)
Globulin, Total: 2.9 g/dL (ref 1.5–4.5)
Glucose: 88 mg/dL (ref 70–99)
Potassium: 4.7 mmol/L (ref 3.5–5.2)
Sodium: 134 mmol/L (ref 134–144)
Total Protein: 6.9 g/dL (ref 6.0–8.5)
eGFR: 71 mL/min/{1.73_m2} (ref >59)

## 2023-08-18 LAB — TSH: TSH: 2.7 u[IU]/mL (ref 0.450–4.500)

## 2023-09-07 DIAGNOSIS — M1711 Unilateral primary osteoarthritis, right knee: Secondary | ICD-10-CM | POA: Diagnosis not present

## 2023-09-22 ENCOUNTER — Other Ambulatory Visit: Payer: Self-pay | Admitting: Cardiology

## 2023-09-22 DIAGNOSIS — I10 Essential (primary) hypertension: Secondary | ICD-10-CM | POA: Diagnosis not present

## 2023-09-22 DIAGNOSIS — I4891 Unspecified atrial fibrillation: Secondary | ICD-10-CM | POA: Diagnosis not present

## 2023-09-26 DIAGNOSIS — I4891 Unspecified atrial fibrillation: Secondary | ICD-10-CM | POA: Diagnosis not present

## 2023-09-26 DIAGNOSIS — I10 Essential (primary) hypertension: Secondary | ICD-10-CM | POA: Diagnosis not present

## 2023-09-26 DIAGNOSIS — E7800 Pure hypercholesterolemia, unspecified: Secondary | ICD-10-CM | POA: Diagnosis not present

## 2023-10-21 DIAGNOSIS — I10 Essential (primary) hypertension: Secondary | ICD-10-CM | POA: Diagnosis not present

## 2023-10-21 DIAGNOSIS — I4891 Unspecified atrial fibrillation: Secondary | ICD-10-CM | POA: Diagnosis not present

## 2023-11-02 DIAGNOSIS — R059 Cough, unspecified: Secondary | ICD-10-CM | POA: Diagnosis not present

## 2023-11-02 DIAGNOSIS — Z125 Encounter for screening for malignant neoplasm of prostate: Secondary | ICD-10-CM | POA: Diagnosis not present

## 2023-11-04 ENCOUNTER — Other Ambulatory Visit: Payer: Self-pay | Admitting: Physician Assistant

## 2023-11-09 DIAGNOSIS — K080 Exfoliation of teeth due to systemic causes: Secondary | ICD-10-CM | POA: Diagnosis not present

## 2023-11-09 DIAGNOSIS — G4733 Obstructive sleep apnea (adult) (pediatric): Secondary | ICD-10-CM | POA: Diagnosis not present

## 2023-11-11 DIAGNOSIS — G4733 Obstructive sleep apnea (adult) (pediatric): Secondary | ICD-10-CM | POA: Diagnosis not present

## 2023-11-19 ENCOUNTER — Ambulatory Visit: Attending: Cardiovascular Disease | Admitting: Cardiovascular Disease

## 2023-11-19 ENCOUNTER — Telehealth (HOSPITAL_COMMUNITY): Payer: Self-pay | Admitting: *Deleted

## 2023-11-19 ENCOUNTER — Encounter: Payer: Self-pay | Admitting: Cardiovascular Disease

## 2023-11-19 VITALS — BP 136/80 | HR 64 | Ht 71.0 in | Wt 237.0 lb

## 2023-11-19 DIAGNOSIS — I2510 Atherosclerotic heart disease of native coronary artery without angina pectoris: Secondary | ICD-10-CM

## 2023-11-19 DIAGNOSIS — I4819 Other persistent atrial fibrillation: Secondary | ICD-10-CM | POA: Diagnosis not present

## 2023-11-19 DIAGNOSIS — I10 Essential (primary) hypertension: Secondary | ICD-10-CM

## 2023-11-19 DIAGNOSIS — Z79899 Other long term (current) drug therapy: Secondary | ICD-10-CM

## 2023-11-19 DIAGNOSIS — Z95818 Presence of other cardiac implants and grafts: Secondary | ICD-10-CM

## 2023-11-19 DIAGNOSIS — E7800 Pure hypercholesterolemia, unspecified: Secondary | ICD-10-CM

## 2023-11-19 MED ORDER — EZETIMIBE 10 MG PO TABS: 10.0000 mg | ORAL_TABLET | Freq: Every day | ORAL | 3 refills | Status: AC

## 2023-11-19 NOTE — Assessment & Plan Note (Signed)
 Treated with aspirin  81 mg daily.

## 2023-11-19 NOTE — Assessment & Plan Note (Signed)
 LDL cholesterol is above goal.  Discussed treatment options with him.  Recommend addition of ezetimibe  10 mg daily with repeat lipids and LFTs in 3 to 4 months.  Continue atorvastatin  40 mg daily.  If he does not get to goal with his diet and lifestyle modification along with addition of ezetimibe , will refer him to the lipid clinic for initiation of a PCSK9 inhibitor.

## 2023-11-19 NOTE — Telephone Encounter (Signed)
 Left detailed instructions for MPI study. Hold metoprolol .

## 2023-11-19 NOTE — Assessment & Plan Note (Signed)
 Patient with known CAD.  I reviewed his cardiac catheterization data from 2019 when he had moderate LAD stenosis of borderline hemodynamic significance.  He underwent stenting of severe stenoses in the circumflex and right coronary arteries.  Considering his known CAD, moderate proximal LAD stenosis, and mild exertional dyspnea, it is appropriate to perform ischemic testing with an exercise Myoview stress test.  He will continue his current medical therapy as above.

## 2023-11-19 NOTE — Assessment & Plan Note (Signed)
 Blood pressure well-controlled on furosemide , losartan , and metoprolol  succinate.

## 2023-11-19 NOTE — Progress Notes (Signed)
 Cardiology Office Note:    Date:  11/19/2023   ID:  Ronald Hull, DOB [birth year], MRN [id number]  PCP:  Ronald Ingle, MD   [facility] Providers Cardiologist:  Ronald Fell, MD Electrophysiologist:  Ronald Gladis Norton, MD     Referring MD: Ronald Ingle, MD   Chief Complaint  Patient presents with   Coronary Artery Disease    History of Present Illness:    Ronald Hull is a 78 y.o. male with a hx of atrial fibrillation, coronary artery disease status post PCI of the right coronary artery, hypertension, hyperlipidemia, and obstructive sleep apnea, presenting for follow-up evaluation.  The patient underwent cardiac catheterization in 2019 when he was found to have mild to moderate diffuse LAD stenosis with borderline DFR of 0.89, severe proximal circumflex stenosis treated with a 3.5 x 15 mm DES and severe mid RCA stenosis treated with a 4.0 x 15 mm DES.  Patient is here alone today.  He has been followed regularly by the electrophysiology team.  He is referred back for evaluation of his coronary artery disease.  The patient was concerned about his risk of progressive CAD as it has been 6 years since his cardiac catheterization.  He remains on aspirin  for antiplatelet therapy.  He has lost about 50 pounds over the past year with diet, exercise, and use of a GLP-1.  His last lipid panel showed a cholesterol of 151, HDL 46, LDL 87 and triglyceride 100.  He walks regularly for exercise and has mild exertional dyspnea.  He has no chest pain or pressure.  He denies edema, orthopnea, PND, or heart palpitations.  He has never had typical angina associated with his coronary artery disease.   Current Medications: Current Meds  Medication Sig   amiodarone  (PACERONE ) 100 MG tablet Take 1 tablet (100 mg total) by mouth every other day.   aspirin  EC 81 MG tablet Take 81 mg by mouth daily. Swallow whole.   atorvastatin  (LIPITOR) 40 MG tablet TAKE 1 TABLET(40 MG) BY MOUTH DAILY    Cyanocobalamin  (VITAMIN B 12 PO) Take by mouth.   ezetimibe  (ZETIA ) 10 MG tablet Take 1 tablet (10 mg total) by mouth daily.   furosemide  (LASIX ) 20 MG tablet Take 1-2 tablets (20-40 mg total) by mouth daily as needed for fluid.   losartan  (COZAAR ) 100 MG tablet TAKE 1 TABLET(100 MG) BY MOUTH DAILY   metoprolol  succinate (TOPROL -XL) 25 MG 24 hr tablet TAKE 1 TABLET(25 MG) BY MOUTH DAILY   nitroGLYCERIN  (NITROSTAT ) 0.4 MG SL tablet PLACE 1 TABLET UNDER THE TONGUE AS NEEDED FOR CHEST PAIN EVERY 5 MINUTES AS NEEDED   zaleplon (SONATA) 5 MG capsule Take 5 mg by mouth at bedtime as needed for sleep.   ZEPBOUND 7.5 MG/0.5ML Pen Inject 7.5 mg into the skin once a week.     Allergies:   Lisinopril    ROS:   Please see the history of present illness.    All other systems reviewed and are negative.  EKGs/Labs/Other Studies Reviewed:    The following studies were reviewed today: Cardiac Studies & Procedures   ______________________________________________________________________________________________ CARDIAC CATHETERIZATION  CARDIAC CATHETERIZATION 02/24/2018  Conclusion  Mid RCA lesion is 80% stenosed.  Prox RCA lesion is 30% stenosed.  Ost Cx to Prox Cx lesion is 95% stenosed.  Prox LAD lesion is 50% stenosed.  Prox LAD to Mid LAD lesion is 50% stenosed.  A drug-eluting stent was successfully placed using a STENT SIERRA 3.50 X 15 MM.  Post intervention, there is a 0% residual stenosis.  A drug-eluting stent was successfully placed using a STENT SIERRA 4.00 X 15 MM.  Post intervention, there is a 0% residual stenosis.  1.  Mild to moderate diffuse LAD stenosis with borderline DFR of 0.89 2.  Severe proximal circumflex stenosis treated successfully with a 3.5 x 15 mm Xience Sierra DES 3.  Severe mid RCA stenosis with heavy calcification, treated successfully with noncompliant balloon angioplasty and stenting with a 4.0 x 15 mm Xience Sierra DES 4.  Multivessel DFR analysis of  the RCA and LAD as detailed above  Recommend to resume Apixaban , at currently prescribed dose and frequency, on 02-25-2018.  Recommend concurrent antiplatelet therapy of Aspirin  81mg  daily for 1 month and Clopidogrel  75mg  daily for 6 months.  Alternatively patient could be considered for the Xience 30 day trial where he would continue ASA 81 mg daily and take clopidogrel  x 30 days. Research staff Ronald review options with him tomorrow prior to discharge.  Findings Coronary Findings Diagnostic  Dominance: Right  Left Anterior Descending Prox LAD lesion is 50% stenosed. Prox LAD to Mid LAD lesion is 50% stenosed. DFR = 0.89  Left Circumflex Ost Cx to Prox Cx lesion is 95% stenosed. The lesion is eccentric.  Right Coronary Artery Prox RCA lesion is 30% stenosed. The lesion is mildly calcified. Mid RCA lesion is 80% stenosed. The lesion is eccentric. The lesion is severely calcified.  Intervention  Ost Cx to Prox Cx lesion Stent CATHETER LAUNCHER 6FREBU 3.5 guide catheter was inserted. Lesion crossed with guidewire using a WIRE COUGAR XT STRL 190CM. Pre-stent angioplasty was performed. A drug-eluting stent was successfully placed using a STENT SIERRA 3.50 X 15 MM. Post-stent angioplasty was performed using a BALLOON SAPPHIRE [place] [id number]. Post-Intervention Lesion Assessment The intervention was successful. Pre-interventional TIMI flow is 3. Post-intervention TIMI flow is 3. No complications occurred at this lesion. There is a 0% residual stenosis post intervention.  Mid RCA lesion Stent CATHETER LAUNCHER 6FR AL1 guide catheter was inserted. Lesion crossed with guidewire using a GUIDEWIRE COMET PRESSURE. Pre-stent angioplasty was performed using a BALLOON SAPPHIRE [place] 3.0X12. A drug-eluting stent was successfully placed using a STENT SIERRA 4.00 X 15 MM. Post-stent angioplasty was performed using a BALLOON [place] EMERGE MR 4.5X8. Post-Intervention Lesion Assessment The intervention was  successful. Pre-interventional TIMI flow is 3. Post-intervention TIMI flow is 3. No complications occurred at this lesion. There is a 0% residual stenosis post intervention.     ECHOCARDIOGRAM  ECHOCARDIOGRAM COMPLETE 05/22/2018  Narrative *[place]* *[facility]* [address] [place], [place] [address] [phone]  ------------------------------------------------------------------- Transthoracic Echocardiography  Patient:    Joaquin, Knebel MR #:       [id number] Study Date: 05/22/2018 Gender:     M Age:        47 Height:     180.3 cm Weight:     122.5 kg BSA:        2.52 m^2 Pt. Status: Room:       6E26C  ATTENDING    Cassondra Ross, M.D. TISA Cassondra Ross, M.D. PERFORMING   [facility], Inpatient SONOGRAPHER  Annabella Hull, RDCS  cc:  ------------------------------------------------------------------- LV EF: 60% -   65%  ------------------------------------------------------------------- Indications:      Chest pain 786.51.  ------------------------------------------------------------------- History:   PMH:  Sleep Apnea. Shortness of Breath.  Coronary artery disease.  PMH:  atrial fibrillation.  Risk factors:  Hypertension. Dyslipidemia.  -------------------------------------------------------------------  Study Conclusions  - Procedure narrative: Transthoracic echocardiography. Image quality was poor. The study was technically difficult, as a result of poor acoustic windows. Intravenous contrast (Definity ) was administered. - Left ventricle: The cavity size was normal. Wall thickness was increased in a pattern of mild LVH. Systolic function was normal. The estimated ejection fraction was in the range of 60% to 65%. Wall motion was normal; there were no regional wall motion abnormalities. Doppler parameters are consistent with restrictive physiology, indicative of decreased left ventricular diastolic compliance and/or increased  left atrial pressure. Doppler parameters are consistent with high ventricular filling pressure. - Aortic valve: Mildly calcified annulus. Trileaflet. There was mild regurgitation. - Mitral valve: Moderately calcified annulus. Normal thickness leaflets . There was mild regurgitation. - Tricuspid valve: There was mild regurgitation. - Systemic veins: IVC poorly visualized but appears dilated.  ------------------------------------------------------------------- Labs, prior tests, procedures, and surgery: atrial fibrillation ablation 05/20/2018.  ------------------------------------------------------------------- Study data:  Comparison was made to the study of 10/24/2015.  Study status:  Routine.  Procedure:  The patient reported no pain pre or post test. Transthoracic echocardiography. Image quality was poor. The study was technically difficult, as a result of poor acoustic windows. Intravenous contrast (Definity ) was administered.  Study completion:  There were no complications.          Transthoracic echocardiography.  M-mode, complete 2D, spectral Doppler, and color Doppler.  Birthdate:  Patient birthdate: Aug 14, 1945.  Age:  Patient is 78 yr old.  Sex:  Gender: male.    BMI: 37.7 kg/m^2.  Blood pressure:     130/78  Patient status:  Observation.  Study date: Study date: 05/22/2018. Study time: 02:35 PM.  Location:  Emergency department.  -------------------------------------------------------------------  ------------------------------------------------------------------- Left ventricle:  The cavity size was normal. Wall thickness was increased in a pattern of mild LVH. Systolic function was normal. The estimated ejection fraction was in the range of 60% to 65%. Wall motion was normal; there were no regional wall motion abnormalities. Doppler parameters are consistent with restrictive physiology, indicative of decreased left ventricular diastolic compliance and/or increased left  atrial pressure. Doppler parameters are consistent with high ventricular filling pressure.  ------------------------------------------------------------------- Aortic valve:   Mildly calcified annulus. Trileaflet.  Doppler: There was no stenosis.   There was mild regurgitation.  ------------------------------------------------------------------- Aorta:  Aortic root: The aortic root was normal in size. Ascending aorta: The ascending aorta was normal in size.  ------------------------------------------------------------------- Mitral valve:   Moderately calcified annulus. Normal thickness leaflets .  Doppler:  There was mild regurgitation.    Peak gradient (D): 7 mm Hg.  ------------------------------------------------------------------- Left atrium:  The atrium was normal in size.  ------------------------------------------------------------------- Atrial septum:  Poorly visualized.  ------------------------------------------------------------------- Right ventricle:  Poorly visualized. The cavity size was normal. Wall thickness was normal. Systolic function was normal.  ------------------------------------------------------------------- Pulmonic valve:    The valve appears to be grossly normal. Doppler:  There was no significant regurgitation.  ------------------------------------------------------------------- Tricuspid valve:   The valve appears to be grossly normal. Doppler:  There was mild regurgitation.  ------------------------------------------------------------------- Right atrium:  Poorly visualized. The atrium was normal in size.  ------------------------------------------------------------------- Pericardium:  There was no pericardial effusion.  ------------------------------------------------------------------- Systemic veins:  IVC poorly visualized but appears dilated.  ------------------------------------------------------------------- Measurements  Left  ventricle                         Value        Reference LV ID, ED, PLAX chordal  49.9  mm     43 - 52 LV ID, ES, PLAX chordal                28.5  mm     23 - 38 LV fx shortening, PLAX chordal         43    %      >=29 LV PW thickness, ED                    11.7  mm     --------- IVS/LV PW ratio, ED                    1.13         <=1.3 Stroke volume, 2D                      73    ml     --------- Stroke volume/bsa, 2D                  29    ml/m^2 --------- LV e&', lateral                         7.29  cm/s   --------- LV E/e&', lateral                       17.83        --------- LV e&', medial                          5.77  cm/s   --------- LV E/e&', medial                        22.53        --------- LV e&', average                         6.53  cm/s   --------- LV E/e&', average                       19.91        ---------  Ventricular septum                     Value        Reference IVS thickness, ED                      13.2  mm     ---------  LVOT                                   Value        Reference LVOT ID, S                             20    mm     --------- LVOT area                              3.14  cm^2   --------- LVOT peak velocity, S                  116  cm/s   --------- LVOT mean velocity, S                  77.8  cm/s   --------- LVOT VTI, S                            23.3  cm     --------- LVOT peak gradient, S                  5     mm Hg  ---------  Aorta                                  Value        Reference Aortic root ID, ED                     32    mm     --------- Ascending aorta ID, A-P, S             34    mm     ---------  Left atrium                            Value        Reference LA ID, A-P, ES                         51    mm     --------- LA ID/bsa, A-P                         2.02  cm/m^2 <=2.2 LA volume, S                           71.5  ml     --------- LA volume/bsa, S                       28.3  ml/m^2 --------- LA  volume, ES, 1-p A4C                 60.4  ml     --------- LA volume/bsa, ES, 1-p A4C             23.9  ml/m^2 --------- LA volume, ES, 1-p A2C                 78.5  ml     --------- LA volume/bsa, ES, 1-p A2C             31.1  ml/m^2 ---------  Mitral valve                           Value        Reference Mitral E-wave peak velocity            130   cm/s   --------- Mitral A-wave peak velocity            55.8  cm/s   --------- Mitral deceleration time               194   ms     150 - 230 Mitral peak gradient, D  7     mm Hg  --------- Mitral E/A ratio, peak                 2.3          ---------  Right ventricle                        Value        Reference TAPSE                                  22.7  mm     ---------  Legend: (L)  and  (H)  mark values outside specified reference range.  ------------------------------------------------------------------- Prepared and Electronically Authenticated by  Pearla Rout, MD 2020-01-25T17:45:22   TEE  ECHO TEE 08/08/2021  Narrative TRANSESOPHOGEAL ECHO REPORT    Patient Name:   LIBERO PUTHOFF [facility] Date of Exam: 08/08/2021 Medical Rec #:  [id number]        Height:       71.0 in Accession #:    [id number]       Weight:       277.8 lb Date of Birth:  [birth year]        BSA:          2.424 m Patient Age:    76 years         BP:           120/80 mmHg Patient Gender: M                HR:           54 bpm. Exam Location:  Inpatient  Procedure: TEE-Intraopertive  Indications:    Watchman  History:        Patient has prior history of Echocardiogram examinations. Arrythmias:Atrial Fibrillation.  Sonographer:    Tiffany Dance RVT Referring Phys: [id number] OLE ONEIDA HOLTS  PROCEDURE: The transesophogeal probe was passed without difficulty through the esophogus of the patient. Sedation performed by different physician. The patient developed no complications during the procedure.  IMPRESSIONS   1. Chicken wing appendage  with no thrombus 27 mm FLX Watchman device deployed Negative tug test. No leak by color flow Average compression 18.8%. 2. Left ventricular ejection fraction, by estimation, is 55 to 60%. The left ventricle has normal function. 3. Right ventricular systolic function is normal. The right ventricular size is normal. 4. Left atrial size was moderately dilated. No left atrial/left atrial appendage thrombus was detected. 5. The mitral valve is abnormal. Mild mitral valve regurgitation. 6. The aortic valve is tricuspid. There is mild calcification of the aortic valve. There is mild thickening of the aortic valve. Aortic valve regurgitation is mild. Mild aortic valve stenosis. 7. PFO prior to trans septal Left to right shunt through trans septal puncture.  FINDINGS Left Ventricle: Left ventricular ejection fraction, by estimation, is 55 to 60%. The left ventricle has normal function. The left ventricular internal cavity size was normal in size.  Right Ventricle: The right ventricular size is normal. No increase in right ventricular wall thickness. Right ventricular systolic function is normal.  Left Atrium: Left atrial size was moderately dilated. No left atrial/left atrial appendage thrombus was detected.  Right Atrium: Right atrial size was normal in size.  Pericardium: There is no evidence of pericardial effusion.  Mitral Valve: The mitral valve is abnormal. There is mild thickening of the mitral valve  leaflet(s). Mild mitral valve regurgitation.  Tricuspid Valve: The tricuspid valve is normal in structure. Tricuspid valve regurgitation is mild.  Aortic Valve: The aortic valve is tricuspid. There is mild calcification of the aortic valve. There is mild thickening of the aortic valve. Aortic valve regurgitation is mild. Aortic regurgitation PHT measures 812 msec. Mild aortic stenosis is present. Aortic valve mean gradient measures 6.0 mmHg. Aortic valve peak gradient measures 9.0 mmHg. Aortic  valve area, by VTI measures 1.99 cm.  Pulmonic Valve: The pulmonic valve was not assessed. Pulmonic valve regurgitation is not visualized.  Aorta: The aortic root is normal in size and structure.  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  Additional Comments: Chicken wing appendage with no thrombus 27 mm FLX Watchman device deployed Negative tug test. No leak by color flow Average compression 18.8%.   LEFT VENTRICLE PLAX 2D LVOT diam:     2.10 cm LV SV:         82 LV SV Index:   34 LVOT Area:     3.46 cm   AORTIC VALVE AV Area (Vmax):    2.29 cm AV Area (Vmean):   2.05 cm AV Area (VTI):     1.99 cm AV Vmax:           150.00 cm/s AV Vmean:          118.000 cm/s AV VTI:            0.412 m AV Peak Grad:      9.0 mmHg AV Mean Grad:      6.0 mmHg LVOT Vmax:         99.30 cm/s LVOT Vmean:        69.700 cm/s LVOT VTI:          0.237 m LVOT/AV VTI ratio: 0.58 AI PHT:            812 msec   SHUNTS Systemic VTI:  0.24 m Systemic Diam: 2.10 cm  Maude Emmer MD Electronically signed by Maude Emmer MD Signature Date/Time: 08/08/2021/9:24:20 AM    Final    CT SCANS  CT CORONARY FRACTIONAL FLOW RESERVE DATA PREP 02/17/2018  Narrative EXAM: CT FFR ANALYSIS  CLINICAL DATA:  78 year old male with PAF and high calcium  score.  FINDINGS: FFRct analysis was performed on the original cardiac CT angiogram dataset. Diagrammatic representation of the FFRct analysis is provided in a separate PDF document in PACS. This dictation was created using the PDF document and an interactive 3D model of the results. 3D model is not available in the EMR/PACS. Normal FFR range is >0.80.  1. Left Main:  No significant stenosis.  2. LAD: Proximal: 0.94, distal 0.77. 3. LCX: 0.5. 4. RCA: No significant stenosis.  IMPRESSION: 1. CT FFR analysis showed hemodynamically significant stenosis in the mid LAD and proximal LCX arteries. A  cardiac catheterization is recommended.   Electronically Signed By: Leim Moose On: 02/18/2018 08:12     ______________________________________________________________________________________________      EKG:        Recent Labs: 02/03/2023: Hemoglobin 14.2; Platelets 194 08/17/2023: ALT 24; BUN 11; Creatinine, Ser 1.07; Potassium 4.7; Sodium 134; TSH 2.700  Recent Lipid Panel    Component Value Date/Time   CHOL 96 (L) 04/09/2018 0959   TRIG 77 04/09/2018 0959   HDL 31 (L) 04/09/2018 0959   CHOLHDL 3.1 04/09/2018 0959   LDLCALC 50 04/09/2018 9040  Physical Exam:    VS:  BP 136/80 (BP Location: Right Arm)   Pulse 64   Ht 5' 11 (1.803 m)   Wt 237 lb (107.5 kg)   SpO2 95%   BMI 33.05 kg/m     Wt Readings from Last 3 Encounters:  11/19/23 237 lb (107.5 kg)  08/17/23 249 lb 9.6 oz (113.2 kg)  03/02/23 282 lb 6.4 oz (128.1 kg)     GEN:  Well nourished, well developed in no acute distress HEENT: Normal NECK: No JVD; No carotid bruits LYMPHATICS: No lymphadenopathy CARDIAC: RRR, no murmurs, rubs, gallops RESPIRATORY:  Clear to auscultation without rales, wheezing or rhonchi  ABDOMEN: Soft, non-tender, non-distended MUSCULOSKELETAL:  No edema; No deformity  SKIN: Warm and dry NEUROLOGIC:  Alert and oriented x 3 PSYCHIATRIC:  Normal affect   Assessment & Plan Persistent atrial fibrillation (HCC) Maintaining sinus rhythm on amiodarone .  Treated with aspirin  after undergoing watchman implantation. Coronary artery disease involving native coronary artery of native heart without angina pectoris Patient with known CAD.  I reviewed his cardiac catheterization data from 2019 when he had moderate LAD stenosis of borderline hemodynamic significance.  He underwent stenting of severe stenoses in the circumflex and right coronary arteries.  Considering his known CAD, moderate proximal LAD stenosis, and mild exertional dyspnea, it is appropriate to perform  ischemic testing with an exercise Myoview stress test.  He Ronald continue his current medical therapy as above. Essential hypertension Blood pressure well-controlled on furosemide , losartan , and metoprolol  succinate. Presence of Watchman left atrial appendage closure device Treated with aspirin  81 mg daily. Medication management Add ezetimibe  10 mg daily. Pure hypercholesterolemia LDL cholesterol is above goal.  Discussed treatment options with him.  Recommend addition of ezetimibe  10 mg daily with repeat lipids and LFTs in 3 to 4 months.  Continue atorvastatin  40 mg daily.  If he does not get to goal with his diet and lifestyle modification along with addition of ezetimibe , Ronald refer him to the lipid clinic for initiation of a PCSK9 inhibitor.      Informed Consent   Shared Decision Making/Informed Consent The risks [chest pain, shortness of breath, cardiac arrhythmias, dizziness, blood pressure fluctuations, myocardial infarction, stroke/transient ischemic attack, nausea, vomiting, allergic reaction, radiation exposure, metallic taste sensation and life-threatening complications (estimated to be 1 in 10,000)], benefits (risk stratification, diagnosing coronary artery disease, treatment guidance) and alternatives of a nuclear stress test were discussed in detail with Mr. Frigon and he agrees to proceed.       Medication Adjustments/Labs and Tests Ordered: Current medicines are reviewed at length with the patient today.  Concerns regarding medicines are outlined above.  Orders Placed This Encounter  Procedures   Lipid panel   Hepatic function panel   MYOCARDIAL PERFUSION IMAGING   Meds ordered this encounter  Medications   ezetimibe  (ZETIA ) 10 MG tablet    Sig: Take 1 tablet (10 mg total) by mouth daily.    Dispense:  90 tablet    Refill:  3    Patient Instructions  Medication Instructions:  START Zetia /Ezetimibe  10mg  daily *If you need a refill on your cardiac medications  before your next appointment, please call your pharmacy*  Lab Work: Lipids, Liver in 3-4 months If you have labs (blood work) drawn today and your tests are completely normal, you Ronald receive your results only by: MyChart Message (if you have MyChart) OR A paper copy in the mail If you have any lab test that is abnormal or  we need to change your treatment, we Ronald call you to review the results.  Testing/Procedures: Exercise Myoview Stress test Your physician has requested that you have en exercise stress myoview. For further information please visit [email or web address] Please follow instruction sheet, as given.  Follow-Up: At [facility], you and your health needs are our priority.  As part of our continuing mission to provide you with exceptional heart care, our providers are all part of one team.  This team includes your primary Cardiologist (physician) and Advanced Practice Providers or APPs (Physician Assistants and Nurse Practitioners) who all work together to provide you with the care you need, when you need it.  Your next appointment:   1 year(s)  Provider:   Ozell Fell, MD      Other Instructions See separate sheet for stress test instructions   Signed, Ronald Fell, MD  11/19/2023 9:49 AM    [facility]

## 2023-11-19 NOTE — Patient Instructions (Signed)
 Medication Instructions:  START Zetia /Ezetimibe  10mg  daily *If you need a refill on your cardiac medications before your next appointment, please call your pharmacy*  Lab Work: Lipids, Liver in 3-4 months If you have labs (blood work) drawn today and your tests are completely normal, you will receive your results only by: MyChart Message (if you have MyChart) OR A paper copy in the mail If you have any lab test that is abnormal or we need to change your treatment, we will call you to review the results.  Testing/Procedures: Exercise Myoview Stress test Your physician has requested that you have en exercise stress myoview. For further information please visit [email or web address] Please follow instruction sheet, as given.  Follow-Up: At [facility], you and your health needs are our priority.  As part of our continuing mission to provide you with exceptional heart care, our providers are all part of one team.  This team includes your primary Cardiologist (physician) and Advanced Practice Providers or APPs (Physician Assistants and Nurse Practitioners) who all work together to provide you with the care you need, when you need it.  Your next appointment:   1 year(s)  Provider:   Ozell Fell, MD      Other Instructions See separate sheet for stress test instructions

## 2023-11-20 DIAGNOSIS — I10 Essential (primary) hypertension: Secondary | ICD-10-CM | POA: Diagnosis not present

## 2023-11-20 DIAGNOSIS — I4891 Unspecified atrial fibrillation: Secondary | ICD-10-CM | POA: Diagnosis not present

## 2023-11-26 ENCOUNTER — Ambulatory Visit (HOSPITAL_COMMUNITY)
Admission: RE | Admit: 2023-11-26 | Source: Ambulatory Visit | Attending: Cardiovascular Disease | Admitting: Cardiovascular Disease

## 2023-11-26 DIAGNOSIS — I4891 Unspecified atrial fibrillation: Secondary | ICD-10-CM | POA: Diagnosis not present

## 2023-11-26 DIAGNOSIS — E7800 Pure hypercholesterolemia, unspecified: Secondary | ICD-10-CM | POA: Diagnosis not present

## 2023-11-26 DIAGNOSIS — I10 Essential (primary) hypertension: Secondary | ICD-10-CM | POA: Diagnosis not present

## 2023-12-10 ENCOUNTER — Encounter (HOSPITAL_COMMUNITY): Payer: Self-pay | Admitting: *Deleted

## 2023-12-16 ENCOUNTER — Ambulatory Visit (HOSPITAL_COMMUNITY)
Admission: RE | Admit: 2023-12-16 | Discharge: 2023-12-16 | Disposition: A | Discharge status: Home / Self Care | Source: Ambulatory Visit | Attending: Physician Assistant | Admitting: Physician Assistant

## 2023-12-16 VITALS — BP 120/80 | HR 85 | Ht 71.0 in | Wt 231.2 lb

## 2023-12-16 DIAGNOSIS — Z5181 Encounter for therapeutic drug level monitoring: Secondary | ICD-10-CM

## 2023-12-16 DIAGNOSIS — Z79899 Other long term (current) drug therapy: Secondary | ICD-10-CM | POA: Diagnosis not present

## 2023-12-16 DIAGNOSIS — I4819 Other persistent atrial fibrillation: Secondary | ICD-10-CM

## 2023-12-16 DIAGNOSIS — I4891 Unspecified atrial fibrillation: Secondary | ICD-10-CM | POA: Diagnosis not present

## 2023-12-16 DIAGNOSIS — D6869 Other thrombophilia: Secondary | ICD-10-CM

## 2023-12-16 MED ORDER — APIXABAN 5 MG PO TABS: 5.0000 mg | ORAL_TABLET | Freq: Two times a day (BID) | ORAL | 3 refills | Status: AC

## 2023-12-16 NOTE — Patient Instructions (Signed)
  Start Eliquis  5mg  twice a day on Monday August 25th  Cardioversion scheduled for: Wednesday, August 27th   - Arrive at the [employer] [facility] ([address]  and check in with ADMITTING at 7:00 AM   - Do not eat or drink anything after midnight the night prior to your procedure.   - Take all your morning medication (except diabetic medications) with a sip of water prior to arrival.  - Do NOT miss any doses of your blood thinner - if you should miss a dose or take a dose more than 4 hours late -- please notify our office immediately.  - You will not be able to drive home after your procedure. Please ensure you have a responsible adult to drive you home. You will need someone with you for 24 hours post procedure.     - Expect to be in the procedural area approximately 2 hours.   - If you feel as if you go back into normal rhythm prior to scheduled cardioversion, please notify our office immediately.   If your procedure is canceled in the cardioversion suite you will be charged a cancellation fee.    Hold below medications 7 days prior to scheduled procedure/anesthesia.  Restart medication on the normal dosing day after scheduled procedure/anesthesia   Tirzepatide  (Zebound)     For those patients who have a scheduled procedure/anesthesia on the same day of the week as their dose, hold the medication on the day of surgery.  They can take their scheduled dose the week before.  **Patients on the above medications scheduled for elective procedures that have not held the medication for the appropriate amount of time are at risk of cancellation or change in the anesthetic plan.

## 2023-12-16 NOTE — Progress Notes (Signed)
 Primary Care Physician: Rexanne Ingle, MD Referring Physician: Dr. Inocencio Cardiologist: Dr. Wonda Charleston Ronald Hull is a 78 y.o. male with a h/o persistent afib, CAD, that is in the afib clinic for f/u.  He presented to his primary physician on 10/02/15 with an elevated and irregular heart rate. Cardioversion was attempted and was unsuccessful with repeat cardioversion successfully to 360 J. He was initially placed on amiodarone  and has since been switched to Multaq . He went back into atrial fibrillation and had cardioversion 10/12/17.  He was scheduled for ablation, but on his pre-ablation CT scan was found to have coronary artery disease.  He was sent to catheterization and had a drug-eluting stent to his circumflex and RCA on 02/24/2018.  He had A. fib ablation January 2020 and has been started on amiodarone . Patient underwent successful DCCV on 09/22/18. Patient had recurrent afib and underwent DCCV on 07/15/19 but did not convert to SR. He underwent repeat ablation with Dr Inocencio on 09/02/19. Patient was seen on 11/21/19 with rate controlled afib and underwent DCCV on 12/07/19. S/p Watchman implant 08/08/21 with Dr Cindie.  Patient returns for follow up for atrial fibrillation. He reports that he went into afib about 4 weeks ago. He is asymptomatic but noticed his resting heart rates was faster by 20-30 bpm. His smart watch also alerted for afib. He has just gotten back from a vacation when he went into afib.   Today, he  denies symptoms of palpitations, chest pain, shortness of breath, orthopnea, PND, lower extremity edema, dizziness, presyncope, syncope, bleeding, or neurologic sequela. The patient is tolerating medications without difficulties and is otherwise without complaint today.    Past Medical History:  Diagnosis Date   Arthritis    Bronchitis 05/23/2018   CAD (coronary artery disease) 02/24/2018   LHC 10/19: pLAD 50, mLAD 50 (dFFR=0.89), oLCx 95, pRCA 30, mRCA 80 >> PCI: DES to LCx;  DES to RCA // [id number] Trial participant (will DC Plavix  after 30 d and remain on ASA + Apixaban )   Chest pain secondary to ablation, with expected mild elevation in troponin 05/22/2018   Complication of anesthesia    BP dropped post colonoscopy   Coronary arteriosclerosis 02/24/2018   Essential hypertension 03/12/2018   Fracture of proximal phalanx of finger 10/24/2019   Hypercholesteremia    Hyperlipidemia 03/12/2018   Lung nodules 02/25/2019   Chest CT 01/2019: LUL nodule unchanged (benign); new LUL nodules 6 mm, aortic atherosclerosis, emphysema, partially calcified pleural plaques suggesting prior empyema or hemothorax   Obstructive sleep apnea    uses CPAP nightly   Pain of left hand 11/22/2019   Paroxysmal atrial fibrillation    persistent    Presence of Watchman left atrial appendage closure device 08/08/2021   Watchman 27mm with Dr. Cindie   Secondary hypercoagulable state 04/05/2019   Wears glasses     Current Outpatient Medications  Medication Sig Dispense Refill   amiodarone  (PACERONE ) 100 MG tablet Take 1 tablet (100 mg total) by mouth every other day. 45 tablet 3   apixaban  (ELIQUIS ) 5 MG TABS tablet Take 1 tablet (5 mg total) by mouth 2 (two) times daily. 60 tablet 3   aspirin  EC 81 MG tablet Take 81 mg by mouth daily. Swallow whole.     atorvastatin  (LIPITOR) 40 MG tablet TAKE 1 TABLET(40 MG) BY MOUTH DAILY 90 tablet 3   Cyanocobalamin  (VITAMIN B 12 PO) Take by mouth. (Patient taking differently: Take 1 tablet by mouth every morning.)  ezetimibe  (ZETIA ) 10 MG tablet Take 1 tablet (10 mg total) by mouth daily. 90 tablet 3   furosemide  (LASIX ) 20 MG tablet Take 1-2 tablets (20-40 mg total) by mouth daily as needed for fluid. 60 tablet 6   losartan  (COZAAR ) 100 MG tablet TAKE 1 TABLET(100 MG) BY MOUTH DAILY 90 tablet 3   metoprolol  succinate (TOPROL -XL) 25 MG 24 hr tablet TAKE 1 TABLET(25 MG) BY MOUTH DAILY 90 tablet 1   nitroGLYCERIN  (NITROSTAT ) 0.4 MG SL tablet  PLACE 1 TABLET UNDER THE TONGUE AS NEEDED FOR CHEST PAIN EVERY 5 MINUTES AS NEEDED 25 tablet 9   zaleplon (SONATA) 5 MG capsule Take 5 mg by mouth at bedtime as needed for sleep.     ZEPBOUND 7.5 MG/0.5ML Pen Inject 7.5 mg into the skin once a week.     No current facility-administered medications for this encounter.    ROS- All systems are reviewed and negative except as per the HPI above  Physical Exam: Vitals:   12/16/23 0850  BP: 120/80  Pulse: 85  Weight: 104.9 kg  Height: 5' 11 (1.803 m)   Wt Readings from Last 3 Encounters:  12/16/23 104.9 kg  11/19/23 107.5 kg  08/17/23 113.2 kg    GEN: Well nourished, well developed in no acute distress CARDIAC: Irregularly irregular rate and rhythm, no murmurs, rubs, gallops RESPIRATORY:  Clear to auscultation without rales, wheezing or rhonchi  ABDOMEN: Soft, non-tender, non-distended EXTREMITIES:  No edema; No deformity    EKG today demonstrates Afib Vent. rate 85 BPM PR interval * ms QRS duration 104 ms QT/QTcB 352/418 ms   Echo 05/22/18 demonstrates - Procedure narrative: Transthoracic echocardiography. Image   quality was poor. The study was technically difficult, as a   result of poor acoustic windows. Intravenous contrast (Definity )   was administered. - Left ventricle: The cavity size was normal. Wall thickness was increased in a pattern of mild LVH. Systolic function was normal.   The estimated ejection fraction was in the range of 60% to 65%.   Wall motion was normal; there were no regional wall motion   abnormalities. Doppler parameters are consistent with restrictive   physiology, indicative of decreased left ventricular diastolic   compliance and/or increased left atrial pressure. Doppler   parameters are consistent with high ventricular filling pressure. - Aortic valve: Mildly calcified annulus. Trileaflet. There was   mild regurgitation. - Mitral valve: Moderately calcified annulus. Normal thickness    leaflets . There was mild regurgitation. - Tricuspid valve: There was mild regurgitation. - Systemic veins: IVC poorly visualized but appears dilated.   CHA2DS2-VASc Score = 4  The patient's score is based upon: CHF History: 0 HTN History: 1 Diabetes History: 0 Stroke History: 0 Vascular Disease History: 1 Age Score: 2 Gender Score: 0       ASSESSMENT AND PLAN: Persistent Atrial Fibrillation (ICD10:  I48.19) The patient's CHA2DS2-VASc score is 4, indicating a 4.8% annual risk of stroke.   S/p ablation 05/20/18 and 09/02/19 He has done well with a very low burden until recently, now persistent. We discussed rhythm control options. Will plan for DCCV. He is > 1 year post Watchman implant. Will start Eliquis  2 days before DCCV and continue 4 weeks post. He does not need TEE.  Check bmet/cbc Continue amiodarone  100 mg every other day Continue Toprol  25 mg daily  Secondary Hypercoagulable State (ICD10:  D68.69) The patient is at significant risk for stroke/thromboembolism based upon his CHA2DS2-VASc Score of 4.  S/p  Watchman implant 08/08/21.   High Risk Medication Monitoring (ICD 10: U5195107) Patient requires ongoing monitoring for anti-arrhythmic medication which has the potential to cause life threatening arrhythmias. Intervals on ECG acceptable for amiodarone  monitoring. Recent Cmet/TSH reviewed.   CAD No anginal symptoms Followed by Dr Wonda  HTN Stable on current regimen  OSA  Encouraged nightly CPAP   Follow up in the AF clinic post DCCV.    Informed Consent   Shared Decision Making/Informed Consent The risks (stroke, cardiac arrhythmias rarely resulting in the need for a temporary or permanent pacemaker, skin irritation or burns and complications associated with conscious sedation including aspiration, arrhythmia, respiratory failure and death), benefits (restoration of normal sinus rhythm) and alternatives of a direct current cardioversion were explained in detail to  Ronald Hull and he agrees to proceed.        Daril Kicks PA-C [facility] [address] [place], [place] [address] [phone]

## 2023-12-16 NOTE — H&P (View-Only) (Signed)
 Primary Care Physician: Rexanne Ingle, MD Referring Physician: Dr. Inocencio Cardiologist: Dr. Wonda Charleston Ronald Hull is a 78 y.o. male with a h/o persistent afib, CAD, that is in the afib clinic for f/u.  He presented to his primary physician on 10/02/15 with an elevated and irregular heart rate. Cardioversion was attempted and was unsuccessful with repeat cardioversion successfully to 360 J. He was initially placed on amiodarone  and has since been switched to Multaq . He went back into atrial fibrillation and had cardioversion 10/12/17.  He was scheduled for ablation, but on his pre-ablation CT scan was found to have coronary artery disease.  He was sent to catheterization and had a drug-eluting stent to his circumflex and RCA on 02/24/2018.  He had A. fib ablation January 2020 and has been started on amiodarone . Patient underwent successful DCCV on 09/22/18. Patient had recurrent afib and underwent DCCV on 07/15/19 but did not convert to SR. He underwent repeat ablation with Dr Inocencio on 09/02/19. Patient was seen on 11/21/19 with rate controlled afib and underwent DCCV on 12/07/19. S/p Watchman implant 08/08/21 with Dr Cindie.  Patient returns for follow up for atrial fibrillation. He reports that he went into afib about 4 weeks ago. He is asymptomatic but noticed his resting heart rates was faster by 20-30 bpm. His smart watch also alerted for afib. He has just gotten back from a vacation when he went into afib.   Today, he  denies symptoms of palpitations, chest pain, shortness of breath, orthopnea, PND, lower extremity edema, dizziness, presyncope, syncope, bleeding, or neurologic sequela. The patient is tolerating medications without difficulties and is otherwise without complaint today.    Past Medical History:  Diagnosis Date   Arthritis    Bronchitis 05/23/2018   CAD (coronary artery disease) 02/24/2018   LHC 10/19: pLAD 50, mLAD 50 (dFFR=0.89), oLCx 95, pRCA 30, mRCA 80 >> PCI: DES to LCx;  DES to RCA // Xience28 Trial participant (will DC Plavix  after 30 d and remain on ASA + Apixaban )   Chest pain secondary to ablation, with expected mild elevation in troponin 05/22/2018   Complication of anesthesia    BP dropped post colonoscopy   Coronary arteriosclerosis 02/24/2018   Essential hypertension 03/12/2018   Fracture of proximal phalanx of finger 10/24/2019   Hypercholesteremia    Hyperlipidemia 03/12/2018   Lung nodules 02/25/2019   Chest CT 01/2019: LUL nodule unchanged (benign); new LUL nodules 6 mm, aortic atherosclerosis, emphysema, partially calcified pleural plaques suggesting prior empyema or hemothorax   Obstructive sleep apnea    uses CPAP nightly   Pain of left hand 11/22/2019   Paroxysmal atrial fibrillation    persistent    Presence of Watchman left atrial appendage closure device 08/08/2021   Watchman 27mm with Dr. Cindie   Secondary hypercoagulable state 04/05/2019   Wears glasses     Current Outpatient Medications  Medication Sig Dispense Refill   amiodarone  (PACERONE ) 100 MG tablet Take 1 tablet (100 mg total) by mouth every other day. 45 tablet 3   apixaban  (ELIQUIS ) 5 MG TABS tablet Take 1 tablet (5 mg total) by mouth 2 (two) times daily. 60 tablet 3   aspirin  EC 81 MG tablet Take 81 mg by mouth daily. Swallow whole.     atorvastatin  (LIPITOR) 40 MG tablet TAKE 1 TABLET(40 MG) BY MOUTH DAILY 90 tablet 3   Cyanocobalamin  (VITAMIN B 12 PO) Take by mouth. (Patient taking differently: Take 1 tablet by mouth every morning.)  ezetimibe  (ZETIA ) 10 MG tablet Take 1 tablet (10 mg total) by mouth daily. 90 tablet 3   furosemide  (LASIX ) 20 MG tablet Take 1-2 tablets (20-40 mg total) by mouth daily as needed for fluid. 60 tablet 6   losartan  (COZAAR ) 100 MG tablet TAKE 1 TABLET(100 MG) BY MOUTH DAILY 90 tablet 3   metoprolol  succinate (TOPROL -XL) 25 MG 24 hr tablet TAKE 1 TABLET(25 MG) BY MOUTH DAILY 90 tablet 1   nitroGLYCERIN  (NITROSTAT ) 0.4 MG SL tablet  PLACE 1 TABLET UNDER THE TONGUE AS NEEDED FOR CHEST PAIN EVERY 5 MINUTES AS NEEDED 25 tablet 9   zaleplon (SONATA) 5 MG capsule Take 5 mg by mouth at bedtime as needed for sleep.     ZEPBOUND 7.5 MG/0.5ML Pen Inject 7.5 mg into the skin once a week.     No current facility-administered medications for this encounter.    ROS- All systems are reviewed and negative except as per the HPI above  Physical Exam: Vitals:   12/16/23 0850  BP: 120/80  Pulse: 85  Weight: 104.9 kg  Height: 5' 11 (1.803 m)   Wt Readings from Last 3 Encounters:  12/16/23 104.9 kg  11/19/23 107.5 kg  08/17/23 113.2 kg    GEN: Well nourished, well developed in no acute distress CARDIAC: Irregularly irregular rate and rhythm, no murmurs, rubs, gallops RESPIRATORY:  Clear to auscultation without rales, wheezing or rhonchi  ABDOMEN: Soft, non-tender, non-distended EXTREMITIES:  No edema; No deformity    EKG today demonstrates Afib Vent. rate 85 BPM PR interval * ms QRS duration 104 ms QT/QTcB 352/418 ms   Echo 05/22/18 demonstrates - Procedure narrative: Transthoracic echocardiography. Image   quality was poor. The study was technically difficult, as a   result of poor acoustic windows. Intravenous contrast (Definity )   was administered. - Left ventricle: The cavity size was normal. Wall thickness was increased in a pattern of mild LVH. Systolic function was normal.   The estimated ejection fraction was in the range of 60% to 65%.   Wall motion was normal; there were no regional wall motion   abnormalities. Doppler parameters are consistent with restrictive   physiology, indicative of decreased left ventricular diastolic   compliance and/or increased left atrial pressure. Doppler   parameters are consistent with high ventricular filling pressure. - Aortic valve: Mildly calcified annulus. Trileaflet. There was   mild regurgitation. - Mitral valve: Moderately calcified annulus. Normal thickness    leaflets . There was mild regurgitation. - Tricuspid valve: There was mild regurgitation. - Systemic veins: IVC poorly visualized but appears dilated.   CHA2DS2-VASc Score = 4  The patient's score is based upon: CHF History: 0 HTN History: 1 Diabetes History: 0 Stroke History: 0 Vascular Disease History: 1 Age Score: 2 Gender Score: 0       ASSESSMENT AND PLAN: Persistent Atrial Fibrillation (ICD10:  I48.19) The patient's CHA2DS2-VASc score is 4, indicating a 4.8% annual risk of stroke.   S/p ablation 05/20/18 and 09/02/19 He has done well with a very low burden until recently, now persistent. We discussed rhythm control options. Will plan for DCCV. He is > 1 year post Watchman implant. Will start Eliquis  2 days before DCCV and continue 4 weeks post. He does not need TEE.  Check bmet/cbc Continue amiodarone  100 mg every other day Continue Toprol  25 mg daily  Secondary Hypercoagulable State (ICD10:  D68.69) The patient is at significant risk for stroke/thromboembolism based upon his CHA2DS2-VASc Score of 4.  S/p  Watchman implant 08/08/21.   High Risk Medication Monitoring (ICD 10: U5195107) Patient requires ongoing monitoring for anti-arrhythmic medication which has the potential to cause life threatening arrhythmias. Intervals on ECG acceptable for amiodarone  monitoring. Recent Cmet/TSH reviewed.   CAD No anginal symptoms Followed by Dr Wonda  HTN Stable on current regimen  OSA  Encouraged nightly CPAP   Follow up in the AF clinic post DCCV.    Informed Consent   Shared Decision Making/Informed Consent The risks (stroke, cardiac arrhythmias rarely resulting in the need for a temporary or permanent pacemaker, skin irritation or burns and complications associated with conscious sedation including aspiration, arrhythmia, respiratory failure and death), benefits (restoration of normal sinus rhythm) and alternatives of a direct current cardioversion were explained in detail to  Ronald Hull and he agrees to proceed.        Daril Kicks PA-C [facility] [address] [place], [place] [address] [phone]

## 2023-12-17 ENCOUNTER — Ambulatory Visit (HOSPITAL_COMMUNITY): Payer: Self-pay | Admitting: Physician Assistant

## 2023-12-17 LAB — BASIC METABOLIC PANEL WITH GFR
BUN/Creatinine Ratio: 16 (ref 10–24)
BUN: 16 mg/dL (ref 8–27)
CO2: 20 mmol/L (ref 20–29)
Calcium: 9.1 mg/dL (ref 8.6–10.2)
Chloride: 98 mmol/L (ref 96–106)
Creatinine, Ser: 1.02 mg/dL (ref 0.76–1.27)
Glucose: 86 mg/dL (ref 70–99)
Potassium: 4.6 mmol/L (ref 3.5–5.2)
Sodium: 133 mmol/L — ABNORMAL LOW (ref 134–144)
eGFR: 75 mL/min/1.73 (ref >59)

## 2023-12-17 LAB — CBC
Hematocrit: 43.8 % (ref 37.5–51.0)
Hemoglobin: 14.1 g/dL (ref 13.0–17.7)
MCH: 31.8 pg (ref 26.6–33.0)
MCHC: 32.2 g/dL (ref 31.5–35.7)
MCV: 99 fL — ABNORMAL HIGH (ref 79–97)
Platelets: 208 x10E3/uL (ref 150–450)
RBC: 4.44 x10E6/uL (ref 4.14–5.80)
RDW: 13.1 % (ref 11.6–15.4)
WBC: 7.8 x10E3/uL (ref 3.4–10.8)

## 2023-12-20 DIAGNOSIS — I10 Essential (primary) hypertension: Secondary | ICD-10-CM | POA: Diagnosis not present

## 2023-12-20 DIAGNOSIS — I4891 Unspecified atrial fibrillation: Secondary | ICD-10-CM | POA: Diagnosis not present

## 2023-12-21 ENCOUNTER — Ambulatory Visit (HOSPITAL_COMMUNITY)

## 2023-12-22 NOTE — Progress Notes (Signed)
 Pt called for pre procedure instructions. Arrival time 0700 NPO after midnight explained Instructed to take am meds with sip of water and confirmed blood thinner consistency, Eliquis . Instructed pt need for ride home tomorrow and have responsible adult with them for 24 hrs post procedure.

## 2023-12-23 ENCOUNTER — Ambulatory Visit (HOSPITAL_COMMUNITY)
Admission: RE | Admit: 2023-12-23 | Discharge: 2023-12-23 | Disposition: A | Discharge status: Home / Self Care | Attending: Cardiology | Admitting: Cardiology

## 2023-12-23 ENCOUNTER — Encounter (HOSPITAL_COMMUNITY): Payer: Self-pay | Admitting: Cardiology

## 2023-12-23 ENCOUNTER — Ambulatory Visit (HOSPITAL_COMMUNITY): Admitting: Anesthesiology

## 2023-12-23 ENCOUNTER — Encounter (HOSPITAL_COMMUNITY)
Admission: RE | Disposition: A | Discharge status: Home / Self Care | Payer: Self-pay | Source: Home / Self Care | Attending: Cardiology

## 2023-12-23 ENCOUNTER — Other Ambulatory Visit: Payer: Self-pay

## 2023-12-23 DIAGNOSIS — Z87891 Personal history of nicotine dependence: Secondary | ICD-10-CM

## 2023-12-23 DIAGNOSIS — I4819 Other persistent atrial fibrillation: Secondary | ICD-10-CM | POA: Diagnosis not present

## 2023-12-23 DIAGNOSIS — I10 Essential (primary) hypertension: Secondary | ICD-10-CM

## 2023-12-23 DIAGNOSIS — G4733 Obstructive sleep apnea (adult) (pediatric): Secondary | ICD-10-CM | POA: Insufficient documentation

## 2023-12-23 DIAGNOSIS — I2510 Atherosclerotic heart disease of native coronary artery without angina pectoris: Secondary | ICD-10-CM

## 2023-12-23 DIAGNOSIS — Z7901 Long term (current) use of anticoagulants: Secondary | ICD-10-CM | POA: Diagnosis not present

## 2023-12-23 DIAGNOSIS — Z95818 Presence of other cardiac implants and grafts: Secondary | ICD-10-CM | POA: Diagnosis not present

## 2023-12-23 DIAGNOSIS — D6869 Other thrombophilia: Secondary | ICD-10-CM | POA: Diagnosis not present

## 2023-12-23 DIAGNOSIS — Z79899 Other long term (current) drug therapy: Secondary | ICD-10-CM | POA: Diagnosis not present

## 2023-12-23 DIAGNOSIS — I4891 Unspecified atrial fibrillation: Secondary | ICD-10-CM

## 2023-12-23 HISTORY — PX: CARDIOVERSION: EP1203

## 2023-12-23 SURGERY — CARDIOVERSION (CATH LAB)
Anesthesia: General

## 2023-12-23 MED ORDER — SODIUM CHLORIDE 0.9 % IV SOLN: INTRAVENOUS | Status: DC

## 2023-12-23 MED ORDER — LIDOCAINE 2% (20 MG/ML) 5 ML SYRINGE
INTRAMUSCULAR | Status: DC | PRN
  Administered 2023-12-23: 40 mg via INTRAVENOUS

## 2023-12-23 MED ORDER — PROPOFOL 10 MG/ML IV BOLUS
INTRAVENOUS | Status: DC | PRN
  Administered 2023-12-23: 40 mg via INTRAVENOUS
  Administered 2023-12-23: 30 mg via INTRAVENOUS

## 2023-12-23 SURGICAL SUPPLY — 1 items: PAD DEFIB RADIO PHYSIO CONN (PAD) ×1 IMPLANT

## 2023-12-23 NOTE — Anesthesia Postprocedure Evaluation (Signed)
 Anesthesia Post Note  Patient: Ronald Hull  Procedure(s) Performed: CARDIOVERSION     Patient location during evaluation: PACU Anesthesia Type: General Level of consciousness: awake and alert, oriented and patient cooperative Pain management: pain level controlled Vital Signs Assessment: post-procedure vital signs reviewed and stable Respiratory status: spontaneous breathing, nonlabored ventilation and respiratory function stable Cardiovascular status: blood pressure returned to baseline and stable Postop Assessment: no apparent nausea or vomiting Anesthetic complications: no   No notable events documented.  Last Vitals:  Vitals:   12/23/23 0715 12/23/23 0815  BP: 113/72 114/69  Pulse: 89 71  Resp: 14 13  Temp:  36.7 C  SpO2: 96% 97%    Last Pain:  Vitals:   12/23/23 0815  TempSrc: Tympanic  PainSc: Asleep                 Almarie CHRISTELLA Marchi

## 2023-12-23 NOTE — Discharge Instructions (Signed)

## 2023-12-23 NOTE — Anesthesia Preprocedure Evaluation (Signed)
 Anesthesia Evaluation  Patient identified by MRN, date of birth, ID band Patient awake    Reviewed: Allergy & Precautions, H&P , NPO status , Patient's Chart, lab work & pertinent test results, reviewed documented beta blocker date and time   History of Anesthesia Complications (+) history of anesthetic complications (required oxygen once when waking up in remote past)  Airway Mallampati: IV  TM Distance: >3 FB Neck ROM: Full    Dental  (+) Teeth Intact, Dental Advisory Given   Pulmonary sleep apnea and Continuous Positive Airway Pressure Ventilation , former smoker   Pulmonary exam normal breath sounds clear to auscultation       Cardiovascular hypertension (113/72 preop), Pt. on medications and Pt. on home beta blockers + dysrhythmias (eliquis ) Atrial Fibrillation + Valvular Problems/Murmurs (mild MR, mild AS, mild AI) MR, AI and AS  Rhythm:Irregular Rate:Normal  Echo 2023  1. Chicken wing appendage with no thrombus 27 mm FLX Watchman device  deployed Negative tug test. No leak by color flow Average compression  18.8%.   2. Left ventricular ejection fraction, by estimation, is 55 to 60%. The  left ventricle has normal function.   3. Right ventricular systolic function is normal. The right ventricular  size is normal.   4. Left atrial size was moderately dilated. No left atrial/left atrial  appendage thrombus was detected.   5. The mitral valve is abnormal. Mild mitral valve regurgitation.   6. The aortic valve is tricuspid. There is mild calcification of the  aortic valve. There is mild thickening of the aortic valve. Aortic valve  regurgitation is mild. Mild aortic valve stenosis.   7. PFO prior to trans septal Left to right shunt through trans septal  puncture.     Neuro/Psych negative neurological ROS  negative psych ROS   GI/Hepatic negative GI ROS, Neg liver ROS,,,  Endo/Other  negative endocrine ROS     Renal/GU negative Renal ROS  negative genitourinary   Musculoskeletal  (+) Arthritis , Osteoarthritis,    Abdominal  (+) + obese  Peds negative pediatric ROS (+)  Hematology negative hematology ROS (+)   Anesthesia Other Findings Zepbound LD >1 week  Reproductive/Obstetrics negative OB ROS                              Anesthesia Physical Anesthesia Plan  ASA: 3  Anesthesia Plan: General   Post-op Pain Management:    Induction: Intravenous  PONV Risk Score and Plan: TIVA and Treatment may vary due to age or medical condition  Airway Management Planned: Natural Airway and Mask  Additional Equipment: None  Intra-op Plan:   Post-operative Plan:   Informed Consent: I have reviewed the patients History and Physical, chart, labs and discussed the procedure including the risks, benefits and alternatives for the proposed anesthesia with the patient or authorized representative who has indicated his/her understanding and acceptance.       Plan Discussed with: CRNA  Anesthesia Plan Comments:         Anesthesia Quick Evaluation

## 2023-12-23 NOTE — CV Procedure (Signed)
 Procedure:   DCCV  Indication:  Symptomatic atrial fibrillation  Procedure Note:  The patient signed informed consent.  They have had had therapeutic anticoagulation with apixaban  for 5 doses, and he has a Watchman that has been verified to not have peridevice leak on CT. We did discuss option for TEE prior for confirmation, and he declined after discussing risks/benefits.  Anesthesia was administered by Dr. Merla.  Patient received 40 mg IV lidocaine  and 70 mg IV propofol .Adequate airway was maintained throughout and vital followed per protocol.  They were cardioverted x 1 with 200J of biphasic synchronized energy.  They converted to NSR.  There were no apparent complications.  The patient had normal neuro status and respiratory status post procedure with vitals stable as recorded elsewhere.    Follow up:  They will continue on current medical therapy and follow up with cardiology as scheduled.  Shelda Bruckner, MD PhD 12/23/2023 8:13 AM

## 2023-12-23 NOTE — Interval H&P Note (Signed)
 History and Physical Interval Note:  12/23/2023 7:37 AM  Ronald Hull  has presented today for surgery, with the diagnosis of AFIB.  The various methods of treatment have been discussed with the patient and family. After consideration of risks, benefits and other options for treatment, the patient has consented to  Procedure(s): CARDIOVERSION (N/A) as a surgical intervention.  The patient's history has been reviewed, patient examined, no change in status, stable for surgery.  I have reviewed the patient's chart and labs.  Questions were answered to the patient's satisfaction.     [name]

## 2023-12-23 NOTE — Transfer of Care (Signed)
 Immediate Anesthesia Transfer of Care Note  Patient: Ronald Hull  Procedure(s) Performed: CARDIOVERSION  Patient Location: PACU  Anesthesia Type:General  Level of Consciousness: awake  Airway & Oxygen Therapy: Patient Spontanous Breathing and Patient connected to nasal cannula oxygen  Post-op Assessment: Report given to RN and Post -op Vital signs reviewed and stable  Post vital signs: Reviewed and stable  Last Vitals:  Vitals Value Taken Time  BP    Temp    Pulse    Resp    SpO2      Last Pain:  Vitals:   12/23/23 0715  TempSrc:   PainSc: 0-No pain         Complications: No notable events documented.

## 2023-12-27 DIAGNOSIS — I4891 Unspecified atrial fibrillation: Secondary | ICD-10-CM | POA: Diagnosis not present

## 2023-12-27 DIAGNOSIS — E7800 Pure hypercholesterolemia, unspecified: Secondary | ICD-10-CM | POA: Diagnosis not present

## 2023-12-27 DIAGNOSIS — I10 Essential (primary) hypertension: Secondary | ICD-10-CM | POA: Diagnosis not present

## 2024-01-01 ENCOUNTER — Encounter (HOSPITAL_COMMUNITY): Payer: Self-pay | Admitting: *Deleted

## 2024-01-06 ENCOUNTER — Other Ambulatory Visit: Payer: Self-pay | Admitting: Cardiovascular Disease

## 2024-01-06 DIAGNOSIS — I4819 Other persistent atrial fibrillation: Secondary | ICD-10-CM

## 2024-01-06 DIAGNOSIS — I2510 Atherosclerotic heart disease of native coronary artery without angina pectoris: Secondary | ICD-10-CM

## 2024-01-06 DIAGNOSIS — I10 Essential (primary) hypertension: Secondary | ICD-10-CM

## 2024-01-06 DIAGNOSIS — Z95818 Presence of other cardiac implants and grafts: Secondary | ICD-10-CM

## 2024-01-06 DIAGNOSIS — Z79899 Other long term (current) drug therapy: Secondary | ICD-10-CM

## 2024-01-06 DIAGNOSIS — E7800 Pure hypercholesterolemia, unspecified: Secondary | ICD-10-CM

## 2024-01-08 DIAGNOSIS — M1712 Unilateral primary osteoarthritis, left knee: Secondary | ICD-10-CM | POA: Diagnosis not present

## 2024-01-08 DIAGNOSIS — M25572 Pain in left ankle and joints of left foot: Secondary | ICD-10-CM | POA: Diagnosis not present

## 2024-01-11 ENCOUNTER — Ambulatory Visit: Payer: Self-pay | Admitting: Cardiovascular Disease

## 2024-01-11 ENCOUNTER — Ambulatory Visit (HOSPITAL_COMMUNITY)
Admission: RE | Admit: 2024-01-11 | Discharge: 2024-01-11 | Disposition: A | Discharge status: Home / Self Care | Source: Ambulatory Visit | Attending: Cardiovascular Disease | Admitting: Cardiovascular Disease

## 2024-01-11 DIAGNOSIS — I2510 Atherosclerotic heart disease of native coronary artery without angina pectoris: Secondary | ICD-10-CM | POA: Diagnosis not present

## 2024-01-11 DIAGNOSIS — E7800 Pure hypercholesterolemia, unspecified: Secondary | ICD-10-CM | POA: Insufficient documentation

## 2024-01-11 DIAGNOSIS — Z79899 Other long term (current) drug therapy: Secondary | ICD-10-CM | POA: Diagnosis not present

## 2024-01-11 DIAGNOSIS — Z95818 Presence of other cardiac implants and grafts: Secondary | ICD-10-CM | POA: Diagnosis not present

## 2024-01-11 DIAGNOSIS — I10 Essential (primary) hypertension: Secondary | ICD-10-CM | POA: Diagnosis not present

## 2024-01-11 DIAGNOSIS — I4819 Other persistent atrial fibrillation: Secondary | ICD-10-CM | POA: Insufficient documentation

## 2024-01-11 LAB — MYOCARDIAL PERFUSION IMAGING
Angina Index: 0
Duke Treadmill Score: -1
Estimated workload: 6.1
Exercise duration (min): 4 min
Exercise duration (sec): 16 s
LV dias vol: 123 mL (ref 62–150)
LV sys vol: 40 mL (ref 4.2–5.8)
MPHR: 134 {beats}/min
Nuc Stress EF: 67 %
Peak HR: 134 {beats}/min
Percent HR: 94 %
RPE: 19
Rest HR: 58 {beats}/min
Rest Nuclear Isotope Dose: 10.2 mCi
SDS: 0
SRS: 2
SSS: 0
ST Depression (mm): 1 mm
Stress Nuclear Isotope Dose: 32 mCi
TID: 1.01

## 2024-01-11 MED ORDER — TECHNETIUM TC 99M TETROFOSMIN IV KIT
10.2000 | PACK | Freq: Once | INTRAVENOUS | Status: AC | PRN
  Administered 2024-01-11: 10.2 via INTRAVENOUS

## 2024-01-11 MED ORDER — TECHNETIUM TC 99M TETROFOSMIN IV KIT
32.0000 | PACK | Freq: Once | INTRAVENOUS | Status: AC | PRN
  Administered 2024-01-11: 32 via INTRAVENOUS

## 2024-01-13 ENCOUNTER — Ambulatory Visit (HOSPITAL_COMMUNITY)
Admission: RE | Admit: 2024-01-13 | Discharge: 2024-01-13 | Disposition: A | Discharge status: Home / Self Care | Source: Ambulatory Visit | Attending: Physician Assistant | Admitting: Physician Assistant

## 2024-01-13 ENCOUNTER — Encounter (HOSPITAL_COMMUNITY): Payer: Self-pay | Admitting: Physician Assistant

## 2024-01-13 VITALS — BP 142/80 | HR 64 | Ht 71.0 in | Wt 222.8 lb

## 2024-01-13 DIAGNOSIS — D6869 Other thrombophilia: Secondary | ICD-10-CM

## 2024-01-13 DIAGNOSIS — Z79899 Other long term (current) drug therapy: Secondary | ICD-10-CM | POA: Diagnosis not present

## 2024-01-13 DIAGNOSIS — Z5181 Encounter for therapeutic drug level monitoring: Secondary | ICD-10-CM | POA: Diagnosis not present

## 2024-01-13 DIAGNOSIS — I4819 Other persistent atrial fibrillation: Secondary | ICD-10-CM

## 2024-01-13 NOTE — Progress Notes (Signed)
 Primary Care Physician: Rexanne Ingle, MD Referring Physician: Dr. Inocencio Cardiologist: Dr. Wonda Charleston Ronald Hull is a 78 y.o. male with a h/o persistent afib, CAD, that is in the afib clinic for f/u.  He presented to his primary physician on 10/02/15 with an elevated and irregular heart rate. Cardioversion was attempted and was unsuccessful with repeat cardioversion successfully to 360 J. He was initially placed on amiodarone  and has since been switched to Multaq . He went back into atrial fibrillation and had cardioversion 10/12/17.  He was scheduled for ablation, but on his pre-ablation CT scan was found to have coronary artery disease.  He was sent to catheterization and had a drug-eluting stent to his circumflex and RCA on 02/24/2018.  He had A. fib ablation January 2020 and has been started on amiodarone . Patient underwent successful DCCV on 09/22/18. Patient had recurrent afib and underwent DCCV on 07/15/19 but did not convert to SR. He underwent repeat ablation with Dr Inocencio on 09/02/19. Patient was seen on 11/21/19 with rate controlled afib and underwent DCCV on 12/07/19. S/p Watchman implant 08/08/21 with Dr Cindie.   He had persistent atrial fibrillation and underwent DCCV on 12/23/23.  Patient returns for follow up for atrial fibrillation and amiodarone  monitoring. He is in SR today and feeling well. No bleeding issues currently on anticoagulation.   Today, he  denies symptoms of palpitations, chest pain, shortness of breath, orthopnea, PND, lower extremity edema, dizziness, presyncope, syncope, bleeding, or neurologic sequela. The patient is tolerating medications without difficulties and is otherwise without complaint today.     Past Medical History:  Diagnosis Date   Arthritis    Bronchitis 05/23/2018   CAD (coronary artery disease) 02/24/2018   LHC 10/19: pLAD 50, mLAD 50 (dFFR=0.89), oLCx 95, pRCA 30, mRCA 80 >> PCI: DES to LCx; DES to RCA // [id number] Trial participant (will DC  Plavix  after 30 d and remain on ASA + Apixaban )   Chest pain secondary to ablation, with expected mild elevation in troponin 05/22/2018   Complication of anesthesia    BP dropped post colonoscopy   Coronary arteriosclerosis 02/24/2018   Essential hypertension 03/12/2018   Fracture of proximal phalanx of finger 10/24/2019   Hypercholesteremia    Hyperlipidemia 03/12/2018   Lung nodules 02/25/2019   Chest CT 01/2019: LUL nodule unchanged (benign); new LUL nodules 6 mm, aortic atherosclerosis, emphysema, partially calcified pleural plaques suggesting prior empyema or hemothorax   Obstructive sleep apnea    uses CPAP nightly   Pain of left hand 11/22/2019   Paroxysmal atrial fibrillation    persistent    Presence of Watchman left atrial appendage closure device 08/08/2021   Watchman 27mm with Dr. Cindie   Secondary hypercoagulable state 04/05/2019   Wears glasses     Current Outpatient Medications  Medication Sig Dispense Refill   amiodarone  (PACERONE ) 100 MG tablet Take 1 tablet (100 mg total) by mouth every other day. 45 tablet 3   apixaban  (ELIQUIS ) 5 MG TABS tablet Take 1 tablet (5 mg total) by mouth 2 (two) times daily. 60 tablet 3   aspirin  EC 81 MG tablet Take 81 mg by mouth every evening. Swallow whole.     atorvastatin  (LIPITOR) 40 MG tablet TAKE 1 TABLET(40 MG) BY MOUTH DAILY (Patient taking differently: Take 40 mg by mouth in the morning.) 90 tablet 3   Cyanocobalamin  (VITAMIN B 12 PO) Take by mouth. (Patient taking differently: Take 1,000 mcg by mouth every morning.)  ezetimibe  (ZETIA ) 10 MG tablet Take 1 tablet (10 mg total) by mouth daily. 90 tablet 3   furosemide  (LASIX ) 20 MG tablet Take 1-2 tablets (20-40 mg total) by mouth daily as needed for fluid. 60 tablet 6   losartan  (COZAAR ) 100 MG tablet TAKE 1 TABLET(100 MG) BY MOUTH DAILY 90 tablet 3   metoprolol  succinate (TOPROL -XL) 25 MG 24 hr tablet TAKE 1 TABLET(25 MG) BY MOUTH DAILY (Patient taking differently: Take 25  mg by mouth every evening.) 90 tablet 1   nitroGLYCERIN  (NITROSTAT ) 0.4 MG SL tablet PLACE 1 TABLET UNDER THE TONGUE AS NEEDED FOR CHEST PAIN EVERY 5 MINUTES AS NEEDED 25 tablet 9   zaleplon (SONATA) 5 MG capsule Take 5 mg by mouth at bedtime as needed for sleep.     ZEPBOUND 7.5 MG/0.5ML Pen Inject 7.5 mg into the skin every Wednesday.     No current facility-administered medications for this encounter.    ROS- All systems are reviewed and negative except as per the HPI above  Physical Exam: Vitals:   01/13/24 0938  BP: (!) 142/80  Pulse: 64  Weight: 101.1 kg  Height: 5' 11 (1.803 m)    Wt Readings from Last 3 Encounters:  01/13/24 101.1 kg  12/16/23 104.9 kg  11/19/23 107.5 kg    GEN: Well nourished, well developed in no acute distress CARDIAC: Regular rate and rhythm, no murmurs, rubs, gallops RESPIRATORY:  Clear to auscultation without rales, wheezing or rhonchi  ABDOMEN: Soft, non-tender, non-distended EXTREMITIES:  No edema; No deformity    EKG today demonstrates SR, 1st degree AV block Vent. rate 64 BPM PR interval 210 ms QRS duration 104 ms QT/QTcB 386/398 ms   Echo 05/22/18 demonstrates - Procedure narrative: Transthoracic echocardiography. Image   quality was poor. The study was technically difficult, as a   result of poor acoustic windows. Intravenous contrast (Definity )   was administered. - Left ventricle: The cavity size was normal. Wall thickness was increased in a pattern of mild LVH. Systolic function was normal.   The estimated ejection fraction was in the range of 60% to 65%.   Wall motion was normal; there were no regional wall motion   abnormalities. Doppler parameters are consistent with restrictive   physiology, indicative of decreased left ventricular diastolic   compliance and/or increased left atrial pressure. Doppler   parameters are consistent with high ventricular filling pressure. - Aortic valve: Mildly calcified annulus. Trileaflet.  There was   mild regurgitation. - Mitral valve: Moderately calcified annulus. Normal thickness   leaflets . There was mild regurgitation. - Tricuspid valve: There was mild regurgitation. - Systemic veins: IVC poorly visualized but appears dilated.   CHA2DS2-VASc Score = 4  The patient's score is based upon: CHF History: 0 HTN History: 1 Diabetes History: 0 Stroke History: 0 Vascular Disease History: 1 Age Score: 2 Gender Score: 0       ASSESSMENT AND PLAN: Persistent Atrial Fibrillation (ICD10:  I48.19) The patient's CHA2DS2-VASc score is 4, indicating a 4.8% annual risk of stroke.   S/p ablation 05/20/18 and 09/02/19 S/p DCCV on 12/23/23 Patient appears to be maintaining SR Continue Eliquis  5 mg BID for 4 weeks post DCCV then discontinue.  Continue amiodarone  100 mg every other day. Continue Toprol  25 mg daily  Secondary Hypercoagulable State (ICD10:  D68.69) The patient is at significant risk for stroke/thromboembolism based upon his CHA2DS2-VASc Score of 4.  Continue Apixaban  (Eliquis ) for 4 weeks post DCCV then discontinue. S/p Watchman implant 08/08/21.  High Risk Medication Monitoring (ICD 10: U5195107) Patient requires ongoing monitoring for anti-arrhythmic medication which has the potential to cause life threatening arrhythmias. Intervals on ECG acceptable for amiodarone  monitoring.   CAD No anginal symptoms Followed by Dr Wonda  HTN Stable on current regimen  OSA  Encouraged nightly CPAP   Follow up in the AF clinic in 6 months.     Daril Kicks PA-C [facility] [address] [place], [place] [address] [phone]

## 2024-01-15 ENCOUNTER — Ambulatory Visit: Payer: Self-pay | Admitting: Cardiovascular Disease

## 2024-01-15 DIAGNOSIS — R918 Other nonspecific abnormal finding of lung field: Secondary | ICD-10-CM

## 2024-01-18 ENCOUNTER — Other Ambulatory Visit (HOSPITAL_BASED_OUTPATIENT_CLINIC_OR_DEPARTMENT_OTHER): Payer: Self-pay

## 2024-01-18 MED ORDER — COMIRNATY 30 MCG/0.3ML IM SUSY
0.3000 mL | PREFILLED_SYRINGE | Freq: Once | INTRAMUSCULAR | 0 refills | Status: AC
  Filled 2024-01-18: qty 0.3, 1d supply, fill #0

## 2024-01-18 MED ORDER — FLUZONE HIGH-DOSE 0.5 ML IM SUSY
0.5000 mL | PREFILLED_SYRINGE | Freq: Once | INTRAMUSCULAR | 0 refills | Status: AC
  Filled 2024-01-18: qty 0.5, 1d supply, fill #0

## 2024-01-19 DIAGNOSIS — I4891 Unspecified atrial fibrillation: Secondary | ICD-10-CM | POA: Diagnosis not present

## 2024-01-19 DIAGNOSIS — I10 Essential (primary) hypertension: Secondary | ICD-10-CM | POA: Diagnosis not present

## 2024-01-26 DIAGNOSIS — I4891 Unspecified atrial fibrillation: Secondary | ICD-10-CM | POA: Diagnosis not present

## 2024-01-26 DIAGNOSIS — E7800 Pure hypercholesterolemia, unspecified: Secondary | ICD-10-CM | POA: Diagnosis not present

## 2024-01-26 DIAGNOSIS — I10 Essential (primary) hypertension: Secondary | ICD-10-CM | POA: Diagnosis not present

## 2024-02-01 ENCOUNTER — Other Ambulatory Visit (HOSPITAL_BASED_OUTPATIENT_CLINIC_OR_DEPARTMENT_OTHER): Payer: Self-pay

## 2024-02-04 DIAGNOSIS — S6992XA Unspecified injury of left wrist, hand and finger(s), initial encounter: Secondary | ICD-10-CM | POA: Diagnosis not present

## 2024-02-04 DIAGNOSIS — W19XXXA Unspecified fall, initial encounter: Secondary | ICD-10-CM | POA: Diagnosis not present

## 2024-02-08 DIAGNOSIS — M1711 Unilateral primary osteoarthritis, right knee: Secondary | ICD-10-CM | POA: Diagnosis not present

## 2024-02-18 DIAGNOSIS — I4819 Other persistent atrial fibrillation: Secondary | ICD-10-CM | POA: Diagnosis not present

## 2024-02-18 DIAGNOSIS — Z95818 Presence of other cardiac implants and grafts: Secondary | ICD-10-CM | POA: Diagnosis not present

## 2024-02-18 DIAGNOSIS — I10 Essential (primary) hypertension: Secondary | ICD-10-CM | POA: Diagnosis not present

## 2024-02-18 DIAGNOSIS — E7800 Pure hypercholesterolemia, unspecified: Secondary | ICD-10-CM | POA: Diagnosis not present

## 2024-02-18 DIAGNOSIS — Z79899 Other long term (current) drug therapy: Secondary | ICD-10-CM | POA: Diagnosis not present

## 2024-02-18 DIAGNOSIS — I4891 Unspecified atrial fibrillation: Secondary | ICD-10-CM | POA: Diagnosis not present

## 2024-02-18 DIAGNOSIS — I2510 Atherosclerotic heart disease of native coronary artery without angina pectoris: Secondary | ICD-10-CM | POA: Diagnosis not present

## 2024-02-19 LAB — HEPATIC FUNCTION PANEL
ALT: 28 IU/L (ref 0–44)
AST: 23 IU/L (ref 0–40)
Albumin: 3.8 g/dL (ref 3.8–4.8)
Alkaline Phosphatase: 81 IU/L (ref 47–123)
Bilirubin Total: 0.6 mg/dL (ref 0.0–1.2)
Bilirubin, Direct: 0.2 mg/dL (ref 0.00–0.40)
Total Protein: 6.1 g/dL (ref 6.0–8.5)

## 2024-02-19 LAB — LIPID PANEL
Chol/HDL Ratio: 2.2 ratio (ref 0.0–5.0)
Cholesterol, Total: 125 mg/dL (ref 100–199)
HDL: 57 mg/dL (ref >39)
LDL Chol Calc (NIH): 55 mg/dL (ref 0–99)
Triglycerides: 61 mg/dL (ref 0–149)
VLDL Cholesterol Cal: 13 mg/dL (ref 5–40)

## 2024-02-26 DIAGNOSIS — I10 Essential (primary) hypertension: Secondary | ICD-10-CM | POA: Diagnosis not present

## 2024-02-26 DIAGNOSIS — E7800 Pure hypercholesterolemia, unspecified: Secondary | ICD-10-CM | POA: Diagnosis not present

## 2024-02-26 DIAGNOSIS — I4891 Unspecified atrial fibrillation: Secondary | ICD-10-CM | POA: Diagnosis not present

## 2024-03-01 ENCOUNTER — Ambulatory Visit: Payer: Medicare Other | Admitting: Neurology

## 2024-03-01 ENCOUNTER — Encounter: Payer: Self-pay | Admitting: Neurology

## 2024-03-01 VITALS — BP 129/72 | HR 68 | Ht 71.0 in | Wt 214.0 lb

## 2024-03-01 DIAGNOSIS — G629 Polyneuropathy, unspecified: Secondary | ICD-10-CM | POA: Diagnosis not present

## 2024-03-01 DIAGNOSIS — R4789 Other speech disturbances: Secondary | ICD-10-CM

## 2024-03-01 NOTE — Progress Notes (Signed)
 Follow-up Visit   Date: 03/01/24   Ronald Hull MRN: [id number] DOB: [birth year]   Interim History: Ronald Hull is a 78 y.o. right-handed Caucasian male with CAD, atrial fibrllation, and hypertension returning to the clinic for follow-up of neuropathy affecting the feet and bilateral hand paresthesias.  The patient was accompanied to the clinic by self.  IMPRESSION/PLAN: Peripheral neuropathy affecting the feet manifesting with tingling.  Risk factors:  history of alcohol use, low B12, age.   Balance has improved with PT.    Bilateral leg weakness - improved  Bilateral hand paresthesias - resolved  Memory changes manifesting with word-finding difficulty.  Formal neuropsychological testing from October 2023 was normal.  Reassured that there is no sign of dementia.   Return to clinic in 1 year  ------------------------------------------------------------- History of present illness: Starting around 2021, he began having sensation that his feet are wrapped in gauze or walking on sand.  Symptoms became more bothersome since the summer of 2022.  He denies burning or tingling.  It involves the soles and balls of the feet.  Symptoms are noticeable when he is resting or in bed.  Balance is getting worse for sometime.  He has fallen several times this year, one with injury.  He does not use a cane and has not done PT.    He lives at home with his wife in a 2-level home.  He works as a Estate Manager/land Agent.  He drinks 2 glasses wine per day.  He was previously drinking 4 glasses wine nightly prior to two years ago.    Vitamin B12 level was 257. He is not taking multivitamin or supplements.  NCS/EMG of the legs performed by Dr. Oneita  shows chronic sensorimotor axonal polyneuropathy.   UPDATE 06/12/2021:  He is here for follow-up.  There has been no change in neuropathy, which remains restricted to the feet.  He has reduced sensation, no pain.  Balance is fair.  He missed PT  appointment due to travel interruption secondary to weather, and when he tried to call back there was no answer. He is still interested in doing PT.  He walks unassisted, no falls.     Lately, he has noticed being more forgetful, especially with names.  He works as a Engineer, Water and has not had any problems keeping up with work duties.  He manages finances, medications, and denies problems with navigation.  Formal neuropsychological testing from October 2023 was normal.   UPDATE 03/02/2023: He is here for follow-up visit.  He is more aware of numbness in the hands which happens at night time.  He is dropping items more frequently, predominately in the right hand.  He is usually able to shake the hands to get relief. He also complains of leg stiffness and bilateral buttocks and hip pain which is worse in the morning.  It typically takes him 15 minutes for him to get going.  He also reports having difficulty from stand up from a chair and tends to rely on his arms to push off, then his legs.   He has near falls.  He walks unassisted and continues to walk 2 miles per day.    UPDATE 03/01/2024:  He is here for follow-up visit.   He completed PT which helped with his balance.  He continues to exercise daily.  He has lost 70lb in the past year with Zepbound. He continues to have difficulty with word-finding. 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   He has a fall while jogging at night in his home and fractured his finger.  There has been worsening of neuropathy in the legs, in fact, he feels that it is less intense.  He no longer has numbness in the hands or weakness in the legs.  He exercises daily as part of his weight loss plan.     Medications:  Current Outpatient Medications on File Prior to Visit  Medication Sig Dispense Refill   amiodarone  (PACERONE ) 100 MG tablet Take 1 tablet (100 mg total) by mouth every other day. 45 tablet 3   apixaban  (ELIQUIS ) 5 MG TABS tablet  Take 1 tablet (5 mg total) by mouth 2 (two) times daily. (Patient not taking: Reported on 03/01/2024) 60 tablet 3   aspirin  EC 81 MG tablet Take 81 mg by mouth every evening. Swallow whole.     atorvastatin  (LIPITOR) 40 MG tablet TAKE 1 TABLET(40 MG) BY MOUTH DAILY (Patient taking differently: Take 40 mg by mouth in the morning.) 90 tablet 3   Cyanocobalamin  (VITAMIN B 12 PO) Take by mouth. (Patient taking differently: Take 1,000 mcg by mouth every morning.)     ezetimibe  (ZETIA ) 10 MG tablet Take 1 tablet (10 mg total) by mouth daily. 90 tablet 3   furosemide  (LASIX ) 20 MG tablet Take 1-2 tablets (20-40 mg total) by mouth daily as needed for fluid. 60 tablet 6   losartan  (COZAAR ) 100 MG tablet TAKE 1 TABLET(100 MG) BY MOUTH DAILY 90 tablet 3   metoprolol  succinate (TOPROL -XL) 25 MG 24 hr tablet TAKE 1 TABLET(25 MG) BY MOUTH DAILY (Patient taking differently: Take 25 mg by mouth every evening.) 90 tablet 1   nitroGLYCERIN  (NITROSTAT ) 0.4 MG SL tablet PLACE 1 TABLET UNDER THE TONGUE AS NEEDED FOR CHEST PAIN EVERY 5 MINUTES AS NEEDED 25 tablet 9   zaleplon (SONATA) 5 MG capsule Take 5 mg by mouth at bedtime as needed for sleep.     ZEPBOUND 7.5 MG/0.5ML Pen Inject 7.5 mg into the skin every Wednesday.     No current facility-administered medications on file prior to visit.    Allergies:  Allergies  Allergen Reactions   Lisinopril  Cough    Vital Signs:  BP 129/72   Pulse 68   Ht 5' 11 (1.803 m)   Wt 214 lb (97.1 kg)   SpO2 98%   BMI 29.85 kg/m   Neurological Exam: MENTAL STATUS including orientation to time, place, person, recent and remote memory, attention span and concentration, language, and fund of knowledge is normal.  Speech is not dysarthric.    06/12/2021    9:00 AM  Montreal Cognitive Assessment   Visuospatial/ Executive (0/5) 5  Naming (0/3) 3  Attention: Read list of digits (0/2) 2  Attention: Read list of letters (0/1) 1  Attention: Serial 7 subtraction starting at  100 (0/3) 3  Language: Repeat phrase (0/2) 2  Language : Fluency (0/1) 1  Abstraction (0/2) 2  Delayed Recall (0/5) 5  Orientation (0/6) 6  Total 30  Adjusted Score (based on education) 30   CRANIAL NERVES:   Normal conjugate, extra-ocular eye movements in all directions of gaze.  No ptosis.    MOTOR:  Motor strength is 5/5 in all extremities, including distally.  No atrophy, fasciculations or abnormal movements.  No pronator drift.  Tone is normal.    MSRs:  Reflexes are 2+/4 throughout, except absent at the ankles.  SENSORY:  Vibration is  reduced at the ankle.  Vibration normal at the knees.  Rhomberg testing is negative.   COORDINATION/GAIT:   Gait wide-based and stable, unassisted.  Tandem gait is intact.   Data: Labs 02/08/2021:  copper  122, thiamine  126, folate 4.8, SPEP with IFE no M protein  NCS/EMG of the legs 12/19/2020:  Chronic sensorimotor axonal and demyelinating neuropathy.  Neuropsychological testing 02/17/22:  Normal      Thank you for allowing me to participate in patient's care.  If I can answer any additional questions, I would be pleased to do so.    Sincerely,    Michela Herst K. Tobie, DO

## 2024-03-19 DIAGNOSIS — I4891 Unspecified atrial fibrillation: Secondary | ICD-10-CM | POA: Diagnosis not present

## 2024-03-19 DIAGNOSIS — I10 Essential (primary) hypertension: Secondary | ICD-10-CM | POA: Diagnosis not present

## 2024-03-23 ENCOUNTER — Other Ambulatory Visit: Payer: Self-pay

## 2024-03-23 DIAGNOSIS — J029 Acute pharyngitis, unspecified: Secondary | ICD-10-CM | POA: Diagnosis not present

## 2024-03-23 DIAGNOSIS — Z03818 Encounter for observation for suspected exposure to other biological agents ruled out: Secondary | ICD-10-CM | POA: Diagnosis not present

## 2024-03-27 DIAGNOSIS — I4891 Unspecified atrial fibrillation: Secondary | ICD-10-CM | POA: Diagnosis not present

## 2024-03-27 DIAGNOSIS — E7800 Pure hypercholesterolemia, unspecified: Secondary | ICD-10-CM | POA: Diagnosis not present

## 2024-03-27 DIAGNOSIS — I10 Essential (primary) hypertension: Secondary | ICD-10-CM | POA: Diagnosis not present

## 2024-03-28 ENCOUNTER — Other Ambulatory Visit: Payer: Self-pay | Admitting: Cardiovascular Disease

## 2024-03-29 MED ORDER — FUROSEMIDE 20 MG PO TABS: 20.0000 mg | ORAL_TABLET | Freq: Every day | ORAL | 6 refills | Status: AC | PRN

## 2024-04-13 DIAGNOSIS — I10 Essential (primary) hypertension: Secondary | ICD-10-CM | POA: Diagnosis not present

## 2024-04-13 DIAGNOSIS — I4891 Unspecified atrial fibrillation: Secondary | ICD-10-CM | POA: Diagnosis not present

## 2024-04-19 ENCOUNTER — Other Ambulatory Visit (HOSPITAL_BASED_OUTPATIENT_CLINIC_OR_DEPARTMENT_OTHER): Payer: Self-pay

## 2024-05-02 ENCOUNTER — Other Ambulatory Visit (HOSPITAL_BASED_OUTPATIENT_CLINIC_OR_DEPARTMENT_OTHER): Payer: Self-pay

## 2024-05-02 MED ORDER — PREVNAR 20 0.5 ML IM SUSY
0.5000 mL | PREFILLED_SYRINGE | Freq: Once | INTRAMUSCULAR | 0 refills | Status: AC
  Filled 2024-05-02: qty 0.5, 1d supply, fill #0

## 2025-03-06 ENCOUNTER — Ambulatory Visit: Admitting: Neurology
